# Patient Record
Sex: Female | Born: 1945 | Race: Black or African American | Hispanic: No | Marital: Single | Smoking: Never smoker
Health system: Southern US, Community
[De-identification: ages and names within clinical notes are randomized; demographics above are authoritative.]

## PROBLEM LIST (undated history)

## (undated) DIAGNOSIS — M1990 Unspecified osteoarthritis, unspecified site: Secondary | ICD-10-CM

## (undated) DIAGNOSIS — I10 Essential (primary) hypertension: Secondary | ICD-10-CM

## (undated) DIAGNOSIS — D649 Anemia, unspecified: Secondary | ICD-10-CM

## (undated) DIAGNOSIS — E538 Deficiency of other specified B group vitamins: Secondary | ICD-10-CM

## (undated) DIAGNOSIS — T7840XA Allergy, unspecified, initial encounter: Secondary | ICD-10-CM

## (undated) DIAGNOSIS — Z9884 Bariatric surgery status: Secondary | ICD-10-CM

## (undated) DIAGNOSIS — E119 Type 2 diabetes mellitus without complications: Secondary | ICD-10-CM

## (undated) DIAGNOSIS — E669 Obesity, unspecified: Secondary | ICD-10-CM

## (undated) DIAGNOSIS — M1710 Unilateral primary osteoarthritis, unspecified knee: Secondary | ICD-10-CM

## (undated) DIAGNOSIS — IMO0002 Reserved for concepts with insufficient information to code with codable children: Secondary | ICD-10-CM

## (undated) DIAGNOSIS — J301 Allergic rhinitis due to pollen: Secondary | ICD-10-CM

## (undated) HISTORY — DX: Essential (primary) hypertension: I10

## (undated) HISTORY — DX: Allergic rhinitis due to pollen: J30.1

## (undated) HISTORY — DX: Deficiency of other specified B group vitamins: E53.8

## (undated) HISTORY — DX: Unspecified osteoarthritis, unspecified site: M19.90

## (undated) HISTORY — DX: Allergy, unspecified, initial encounter: T78.40XA

## (undated) HISTORY — DX: Obesity, unspecified: E66.9

## (undated) HISTORY — DX: Reserved for concepts with insufficient information to code with codable children: IMO0002

## (undated) HISTORY — PX: JOINT REPLACEMENT: SHX530

## (undated) HISTORY — DX: Type 2 diabetes mellitus without complications: E11.9

## (undated) HISTORY — DX: Unilateral primary osteoarthritis, unspecified knee: M17.10

## (undated) HISTORY — DX: Bariatric surgery status: Z98.84

## (undated) HISTORY — DX: Anemia, unspecified: D64.9

---

## 2002-04-23 HISTORY — PX: GASTRIC BYPASS: SHX52

## 2004-02-22 ENCOUNTER — Ambulatory Visit: Payer: Self-pay | Admitting: Family Medicine

## 2004-06-26 ENCOUNTER — Ambulatory Visit: Payer: Self-pay | Admitting: Family Medicine

## 2004-07-05 ENCOUNTER — Ambulatory Visit (HOSPITAL_COMMUNITY): Admission: RE | Admit: 2004-07-05 | Discharge: 2004-07-05 | Payer: Self-pay | Admitting: Family Medicine

## 2004-07-31 ENCOUNTER — Ambulatory Visit: Payer: Self-pay | Admitting: Family Medicine

## 2004-08-02 ENCOUNTER — Ambulatory Visit (HOSPITAL_COMMUNITY): Admission: RE | Admit: 2004-08-02 | Discharge: 2004-08-02 | Payer: Self-pay | Admitting: Family Medicine

## 2004-11-15 ENCOUNTER — Ambulatory Visit: Payer: Self-pay | Admitting: Family Medicine

## 2005-01-26 ENCOUNTER — Ambulatory Visit: Payer: Self-pay | Admitting: Family Medicine

## 2005-05-23 ENCOUNTER — Ambulatory Visit: Payer: Self-pay | Admitting: Family Medicine

## 2005-05-30 ENCOUNTER — Encounter (HOSPITAL_COMMUNITY): Admission: RE | Admit: 2005-05-30 | Discharge: 2005-06-29 | Payer: Self-pay | Admitting: Oncology

## 2005-05-30 ENCOUNTER — Encounter: Admission: RE | Admit: 2005-05-30 | Discharge: 2005-05-30 | Payer: Self-pay | Admitting: Oncology

## 2005-05-30 ENCOUNTER — Ambulatory Visit (HOSPITAL_COMMUNITY): Payer: Self-pay | Admitting: Oncology

## 2005-07-03 ENCOUNTER — Encounter: Admission: RE | Admit: 2005-07-03 | Discharge: 2005-07-03 | Payer: Self-pay | Admitting: Oncology

## 2005-07-03 ENCOUNTER — Encounter (HOSPITAL_COMMUNITY): Admission: RE | Admit: 2005-07-03 | Discharge: 2005-08-02 | Payer: Self-pay | Admitting: Oncology

## 2005-07-17 ENCOUNTER — Ambulatory Visit (HOSPITAL_COMMUNITY): Payer: Self-pay | Admitting: Oncology

## 2005-08-06 ENCOUNTER — Ambulatory Visit (HOSPITAL_COMMUNITY): Admission: RE | Admit: 2005-08-06 | Discharge: 2005-08-06 | Payer: Self-pay | Admitting: Family Medicine

## 2005-08-17 ENCOUNTER — Encounter (HOSPITAL_COMMUNITY): Admission: RE | Admit: 2005-08-17 | Discharge: 2005-09-16 | Payer: Self-pay | Admitting: Oncology

## 2005-08-17 ENCOUNTER — Encounter: Admission: RE | Admit: 2005-08-17 | Discharge: 2005-08-17 | Payer: Self-pay | Admitting: Oncology

## 2005-08-28 ENCOUNTER — Ambulatory Visit: Payer: Self-pay | Admitting: Family Medicine

## 2005-09-14 ENCOUNTER — Ambulatory Visit (HOSPITAL_COMMUNITY): Payer: Self-pay | Admitting: Oncology

## 2005-10-12 ENCOUNTER — Encounter (HOSPITAL_COMMUNITY): Admission: RE | Admit: 2005-10-12 | Discharge: 2005-11-11 | Payer: Self-pay | Admitting: Oncology

## 2005-10-12 ENCOUNTER — Encounter: Admission: RE | Admit: 2005-10-12 | Discharge: 2005-10-12 | Payer: Self-pay | Admitting: Oncology

## 2005-11-09 ENCOUNTER — Ambulatory Visit (HOSPITAL_COMMUNITY): Payer: Self-pay | Admitting: Oncology

## 2005-12-07 ENCOUNTER — Encounter (HOSPITAL_COMMUNITY): Admission: RE | Admit: 2005-12-07 | Discharge: 2006-01-06 | Payer: Self-pay | Admitting: Oncology

## 2005-12-07 ENCOUNTER — Encounter: Admission: RE | Admit: 2005-12-07 | Discharge: 2005-12-07 | Payer: Self-pay | Admitting: Oncology

## 2005-12-12 ENCOUNTER — Ambulatory Visit: Payer: Self-pay | Admitting: Family Medicine

## 2005-12-12 ENCOUNTER — Encounter (INDEPENDENT_AMBULATORY_CARE_PROVIDER_SITE_OTHER): Payer: Self-pay | Admitting: Specialist

## 2005-12-19 ENCOUNTER — Other Ambulatory Visit: Admission: RE | Admit: 2005-12-19 | Discharge: 2005-12-19 | Payer: Self-pay | Admitting: Family Medicine

## 2005-12-19 ENCOUNTER — Ambulatory Visit (HOSPITAL_COMMUNITY): Admission: RE | Admit: 2005-12-19 | Discharge: 2005-12-19 | Payer: Self-pay | Admitting: Family Medicine

## 2005-12-19 ENCOUNTER — Encounter: Payer: Self-pay | Admitting: Family Medicine

## 2006-01-04 ENCOUNTER — Ambulatory Visit (HOSPITAL_COMMUNITY): Payer: Self-pay | Admitting: Oncology

## 2006-02-01 ENCOUNTER — Encounter: Admission: RE | Admit: 2006-02-01 | Discharge: 2006-02-01 | Payer: Self-pay | Admitting: Oncology

## 2006-02-05 ENCOUNTER — Ambulatory Visit: Payer: Self-pay | Admitting: Family Medicine

## 2006-02-28 ENCOUNTER — Ambulatory Visit (HOSPITAL_COMMUNITY): Payer: Self-pay | Admitting: Oncology

## 2006-02-28 ENCOUNTER — Ambulatory Visit: Payer: Self-pay | Admitting: Family Medicine

## 2006-04-23 HISTORY — PX: COLONOSCOPY: SHX174

## 2006-04-25 ENCOUNTER — Ambulatory Visit (HOSPITAL_COMMUNITY): Payer: Self-pay | Admitting: Oncology

## 2006-05-29 ENCOUNTER — Encounter (HOSPITAL_COMMUNITY): Admission: RE | Admit: 2006-05-29 | Discharge: 2006-06-28 | Payer: Self-pay | Admitting: Oncology

## 2006-06-27 ENCOUNTER — Ambulatory Visit: Payer: Self-pay | Admitting: Family Medicine

## 2006-06-27 ENCOUNTER — Ambulatory Visit (HOSPITAL_COMMUNITY): Payer: Self-pay | Admitting: Oncology

## 2006-08-27 ENCOUNTER — Ambulatory Visit (HOSPITAL_COMMUNITY): Admission: RE | Admit: 2006-08-27 | Discharge: 2006-08-27 | Payer: Self-pay | Admitting: Family Medicine

## 2006-08-27 ENCOUNTER — Ambulatory Visit: Payer: Self-pay | Admitting: Family Medicine

## 2006-08-27 ENCOUNTER — Ambulatory Visit (HOSPITAL_COMMUNITY): Payer: Self-pay | Admitting: Oncology

## 2006-10-23 ENCOUNTER — Ambulatory Visit (HOSPITAL_COMMUNITY): Payer: Self-pay | Admitting: Oncology

## 2006-12-04 ENCOUNTER — Encounter (HOSPITAL_COMMUNITY): Admission: RE | Admit: 2006-12-04 | Discharge: 2007-01-03 | Payer: Self-pay | Admitting: Oncology

## 2006-12-06 ENCOUNTER — Encounter: Payer: Self-pay | Admitting: Family Medicine

## 2006-12-06 LAB — CONVERTED CEMR LAB
Basophils Absolute: 0 10*3/uL (ref 0.0–0.1)
Basophils Relative: 0 % (ref 0–1)
Cholesterol: 174 mg/dL (ref 0–200)
Eosinophils Absolute: 0.1 10*3/uL (ref 0.0–0.7)
Eosinophils Relative: 3 % (ref 0–5)
HCT: 38.4 % (ref 36.0–46.0)
Hemoglobin: 12.8 g/dL (ref 12.0–15.0)
LDL Cholesterol: 83 mg/dL (ref 0–99)
Lymphs Abs: 1.3 10*3/uL (ref 0.7–3.3)
Monocytes Absolute: 0.7 10*3/uL (ref 0.2–0.7)
Monocytes Relative: 16 % — ABNORMAL HIGH (ref 3–11)
Neutro Abs: 2.2 10*3/uL (ref 1.7–7.7)
Potassium: 3.7 meq/L (ref 3.5–5.3)
RBC: 4.12 M/uL (ref 3.87–5.11)
TSH: 1.01 microintl units/mL (ref 0.350–5.50)
Total CHOL/HDL Ratio: 2.3
Triglycerides: 68 mg/dL (ref <150)

## 2006-12-20 ENCOUNTER — Encounter: Payer: Self-pay | Admitting: Family Medicine

## 2006-12-20 ENCOUNTER — Ambulatory Visit: Payer: Self-pay | Admitting: Family Medicine

## 2006-12-20 ENCOUNTER — Other Ambulatory Visit: Admission: RE | Admit: 2006-12-20 | Discharge: 2006-12-20 | Payer: Self-pay | Admitting: Family Medicine

## 2006-12-20 LAB — CONVERTED CEMR LAB: Pap Smear: NORMAL

## 2007-01-01 ENCOUNTER — Ambulatory Visit (HOSPITAL_COMMUNITY): Payer: Self-pay | Admitting: Oncology

## 2007-01-06 ENCOUNTER — Ambulatory Visit: Payer: Self-pay | Admitting: Family Medicine

## 2007-01-07 ENCOUNTER — Encounter: Payer: Self-pay | Admitting: Family Medicine

## 2007-02-26 ENCOUNTER — Ambulatory Visit (HOSPITAL_COMMUNITY): Payer: Self-pay | Admitting: Oncology

## 2007-03-26 ENCOUNTER — Encounter (HOSPITAL_COMMUNITY): Admission: RE | Admit: 2007-03-26 | Discharge: 2007-04-23 | Payer: Self-pay | Admitting: Oncology

## 2007-04-24 ENCOUNTER — Encounter: Payer: Self-pay | Admitting: Family Medicine

## 2007-05-02 ENCOUNTER — Ambulatory Visit (HOSPITAL_COMMUNITY): Payer: Self-pay | Admitting: Oncology

## 2007-05-13 ENCOUNTER — Ambulatory Visit: Payer: Self-pay | Admitting: Family Medicine

## 2007-05-20 ENCOUNTER — Ambulatory Visit (HOSPITAL_COMMUNITY): Admission: RE | Admit: 2007-05-20 | Discharge: 2007-05-20 | Payer: Self-pay | Admitting: Family Medicine

## 2007-06-03 ENCOUNTER — Encounter (HOSPITAL_COMMUNITY): Admission: RE | Admit: 2007-06-03 | Discharge: 2007-07-03 | Payer: Self-pay | Admitting: Oncology

## 2007-07-01 ENCOUNTER — Ambulatory Visit (HOSPITAL_COMMUNITY): Payer: Self-pay | Admitting: Oncology

## 2007-08-07 ENCOUNTER — Ambulatory Visit: Payer: Self-pay | Admitting: Family Medicine

## 2007-08-15 ENCOUNTER — Encounter: Payer: Self-pay | Admitting: Family Medicine

## 2007-08-15 DIAGNOSIS — I10 Essential (primary) hypertension: Secondary | ICD-10-CM

## 2007-08-15 DIAGNOSIS — J301 Allergic rhinitis due to pollen: Secondary | ICD-10-CM

## 2007-08-15 DIAGNOSIS — E66811 Obesity, class 1: Secondary | ICD-10-CM | POA: Insufficient documentation

## 2007-08-27 ENCOUNTER — Ambulatory Visit (HOSPITAL_COMMUNITY): Payer: Self-pay | Admitting: Oncology

## 2007-09-03 ENCOUNTER — Ambulatory Visit (HOSPITAL_COMMUNITY): Admission: RE | Admit: 2007-09-03 | Discharge: 2007-09-03 | Payer: Self-pay | Admitting: Family Medicine

## 2007-10-29 ENCOUNTER — Encounter (HOSPITAL_COMMUNITY): Admission: RE | Admit: 2007-10-29 | Discharge: 2007-11-28 | Payer: Self-pay | Admitting: Oncology

## 2007-10-29 ENCOUNTER — Ambulatory Visit (HOSPITAL_COMMUNITY): Payer: Self-pay | Admitting: Oncology

## 2007-11-17 ENCOUNTER — Ambulatory Visit: Payer: Self-pay | Admitting: Family Medicine

## 2007-12-03 ENCOUNTER — Ambulatory Visit: Payer: Self-pay | Admitting: Orthopedic Surgery

## 2007-12-04 ENCOUNTER — Encounter: Payer: Self-pay | Admitting: Orthopedic Surgery

## 2007-12-05 ENCOUNTER — Encounter: Payer: Self-pay | Admitting: Orthopedic Surgery

## 2007-12-12 ENCOUNTER — Encounter: Payer: Self-pay | Admitting: Family Medicine

## 2007-12-12 LAB — CONVERTED CEMR LAB
BUN: 15 mg/dL (ref 6–23)
Basophils Relative: 1 % (ref 0–1)
CO2: 24 meq/L (ref 19–32)
Chloride: 107 meq/L (ref 96–112)
Cholesterol: 180 mg/dL (ref 0–200)
Eosinophils Relative: 3 % (ref 0–5)
Glucose, Bld: 81 mg/dL (ref 70–99)
Hemoglobin: 13.2 g/dL (ref 12.0–15.0)
Lymphocytes Relative: 50 % — ABNORMAL HIGH (ref 12–46)
MCV: 90.1 fL (ref 78.0–100.0)
Neutro Abs: 1.3 10*3/uL — ABNORMAL LOW (ref 1.7–7.7)
Potassium: 4.1 meq/L (ref 3.5–5.3)
RBC: 4.34 M/uL (ref 3.87–5.11)
Sodium: 142 meq/L (ref 135–145)
Total CHOL/HDL Ratio: 2.3
VLDL: 14 mg/dL (ref 0–40)
WBC: 3.5 10*3/uL — ABNORMAL LOW (ref 4.0–10.5)

## 2007-12-26 ENCOUNTER — Ambulatory Visit: Payer: Self-pay | Admitting: Family Medicine

## 2007-12-26 ENCOUNTER — Ambulatory Visit (HOSPITAL_COMMUNITY): Payer: Self-pay | Admitting: Oncology

## 2008-02-03 ENCOUNTER — Telehealth: Payer: Self-pay | Admitting: Family Medicine

## 2008-02-13 ENCOUNTER — Encounter: Payer: Self-pay | Admitting: Orthopedic Surgery

## 2008-02-13 ENCOUNTER — Encounter: Payer: Self-pay | Admitting: Family Medicine

## 2008-03-04 ENCOUNTER — Encounter: Payer: Self-pay | Admitting: Family Medicine

## 2008-03-04 ENCOUNTER — Other Ambulatory Visit: Admission: RE | Admit: 2008-03-04 | Discharge: 2008-03-04 | Payer: Self-pay | Admitting: Family Medicine

## 2008-03-04 ENCOUNTER — Ambulatory Visit: Payer: Self-pay | Admitting: Family Medicine

## 2008-03-04 ENCOUNTER — Ambulatory Visit (HOSPITAL_COMMUNITY): Payer: Self-pay | Admitting: Oncology

## 2008-03-29 ENCOUNTER — Ambulatory Visit: Payer: Self-pay | Admitting: Orthopedic Surgery

## 2008-03-29 DIAGNOSIS — Q762 Congenital spondylolisthesis: Secondary | ICD-10-CM | POA: Insufficient documentation

## 2008-04-29 ENCOUNTER — Encounter (HOSPITAL_COMMUNITY): Admission: RE | Admit: 2008-04-29 | Discharge: 2008-05-29 | Payer: Self-pay | Admitting: Oncology

## 2008-04-29 ENCOUNTER — Ambulatory Visit (HOSPITAL_COMMUNITY): Payer: Self-pay | Admitting: Oncology

## 2008-06-01 ENCOUNTER — Encounter: Payer: Self-pay | Admitting: Family Medicine

## 2008-06-29 ENCOUNTER — Ambulatory Visit (HOSPITAL_COMMUNITY): Payer: Self-pay | Admitting: Oncology

## 2008-07-01 ENCOUNTER — Ambulatory Visit: Payer: Self-pay | Admitting: Family Medicine

## 2008-08-24 ENCOUNTER — Ambulatory Visit (HOSPITAL_COMMUNITY): Payer: Self-pay | Admitting: Oncology

## 2008-09-28 ENCOUNTER — Ambulatory Visit (HOSPITAL_COMMUNITY): Admission: RE | Admit: 2008-09-28 | Discharge: 2008-09-28 | Payer: Self-pay | Admitting: Family Medicine

## 2008-10-28 ENCOUNTER — Encounter (HOSPITAL_COMMUNITY): Admission: RE | Admit: 2008-10-28 | Discharge: 2008-11-27 | Payer: Self-pay | Admitting: Oncology

## 2008-10-28 ENCOUNTER — Ambulatory Visit: Payer: Self-pay | Admitting: Family Medicine

## 2008-10-28 ENCOUNTER — Ambulatory Visit (HOSPITAL_COMMUNITY): Payer: Self-pay | Admitting: Oncology

## 2008-10-28 DIAGNOSIS — E538 Deficiency of other specified B group vitamins: Secondary | ICD-10-CM

## 2008-10-28 HISTORY — DX: Deficiency of other specified B group vitamins: E53.8

## 2008-12-23 ENCOUNTER — Ambulatory Visit (HOSPITAL_COMMUNITY): Payer: Self-pay | Admitting: Oncology

## 2009-01-27 ENCOUNTER — Ambulatory Visit: Payer: Self-pay | Admitting: Family Medicine

## 2009-01-27 DIAGNOSIS — R5383 Other fatigue: Secondary | ICD-10-CM

## 2009-01-27 DIAGNOSIS — R5381 Other malaise: Secondary | ICD-10-CM

## 2009-01-31 LAB — CONVERTED CEMR LAB
Basophils Absolute: 0 10*3/uL (ref 0.0–0.1)
CO2: 24 meq/L (ref 19–32)
Calcium: 8.8 mg/dL (ref 8.4–10.5)
Glucose, Bld: 83 mg/dL (ref 70–99)
HCT: 39.2 % (ref 36.0–46.0)
HDL: 75 mg/dL (ref >39)
Hemoglobin: 13 g/dL (ref 12.0–15.0)
Lymphocytes Relative: 49 % — ABNORMAL HIGH (ref 12–46)
MCHC: 33.2 g/dL (ref 30.0–36.0)
MCV: 91.2 fL (ref 78.0–100.0)
Monocytes Absolute: 0.5 10*3/uL (ref 0.1–1.0)
Monocytes Relative: 13 % — ABNORMAL HIGH (ref 3–12)
Potassium: 3.9 meq/L (ref 3.5–5.3)
RBC: 4.3 M/uL (ref 3.87–5.11)
Sodium: 143 meq/L (ref 135–145)
Total CHOL/HDL Ratio: 2.3
WBC: 4.2 10*3/uL (ref 4.0–10.5)

## 2009-02-24 ENCOUNTER — Ambulatory Visit (HOSPITAL_COMMUNITY): Payer: Self-pay | Admitting: Oncology

## 2009-03-08 ENCOUNTER — Other Ambulatory Visit: Admission: RE | Admit: 2009-03-08 | Discharge: 2009-03-08 | Payer: Self-pay | Admitting: Family Medicine

## 2009-03-08 ENCOUNTER — Ambulatory Visit: Payer: Self-pay | Admitting: Family Medicine

## 2009-03-08 ENCOUNTER — Encounter: Payer: Self-pay | Admitting: Family Medicine

## 2009-03-28 ENCOUNTER — Telehealth: Payer: Self-pay | Admitting: Family Medicine

## 2009-04-21 ENCOUNTER — Ambulatory Visit (HOSPITAL_COMMUNITY): Payer: Self-pay | Admitting: Oncology

## 2009-05-19 ENCOUNTER — Encounter (HOSPITAL_COMMUNITY): Admission: RE | Admit: 2009-05-19 | Discharge: 2009-06-18 | Payer: Self-pay | Admitting: Oncology

## 2009-06-01 ENCOUNTER — Encounter: Payer: Self-pay | Admitting: Family Medicine

## 2009-06-16 ENCOUNTER — Ambulatory Visit (HOSPITAL_COMMUNITY): Payer: Self-pay | Admitting: Oncology

## 2009-07-07 ENCOUNTER — Ambulatory Visit: Payer: Self-pay | Admitting: Family Medicine

## 2009-07-07 LAB — CONVERTED CEMR LAB
OCCULT 1: NEGATIVE
OCCULT 2: NEGATIVE

## 2009-08-16 ENCOUNTER — Ambulatory Visit (HOSPITAL_COMMUNITY): Payer: Self-pay | Admitting: Oncology

## 2009-10-03 ENCOUNTER — Ambulatory Visit (HOSPITAL_COMMUNITY): Admission: RE | Admit: 2009-10-03 | Discharge: 2009-10-03 | Payer: Self-pay | Admitting: Family Medicine

## 2009-10-19 ENCOUNTER — Ambulatory Visit (HOSPITAL_COMMUNITY): Payer: Self-pay | Admitting: Oncology

## 2009-11-08 ENCOUNTER — Ambulatory Visit: Payer: Self-pay | Admitting: Family Medicine

## 2009-11-16 ENCOUNTER — Ambulatory Visit: Payer: Self-pay | Admitting: Family Medicine

## 2009-12-14 ENCOUNTER — Ambulatory Visit (HOSPITAL_COMMUNITY): Payer: Self-pay | Admitting: Oncology

## 2009-12-29 ENCOUNTER — Telehealth: Payer: Self-pay | Admitting: Family Medicine

## 2010-01-16 ENCOUNTER — Ambulatory Visit: Payer: Self-pay | Admitting: Orthopedic Surgery

## 2010-01-16 DIAGNOSIS — M1710 Unilateral primary osteoarthritis, unspecified knee: Secondary | ICD-10-CM

## 2010-02-21 ENCOUNTER — Encounter: Payer: Self-pay | Admitting: Family Medicine

## 2010-02-22 ENCOUNTER — Encounter: Payer: Self-pay | Admitting: Family Medicine

## 2010-02-22 LAB — CONVERTED CEMR LAB
Basophils Absolute: 0 10*3/uL (ref 0.0–0.1)
Cholesterol: 186 mg/dL (ref 0–200)
Creatinine, Ser: 0.82 mg/dL (ref 0.40–1.20)
Eosinophils Absolute: 0.1 10*3/uL (ref 0.0–0.7)
Eosinophils Relative: 3 % (ref 0–5)
Hemoglobin: 13.1 g/dL (ref 12.0–15.0)
Lymphs Abs: 2 10*3/uL (ref 0.7–4.0)
MCHC: 33.8 g/dL (ref 30.0–36.0)
Neutro Abs: 2.3 10*3/uL (ref 1.7–7.7)
Potassium: 3.8 meq/L (ref 3.5–5.3)
RDW: 15.7 % — ABNORMAL HIGH (ref 11.5–15.5)
TSH: 1.24 microintl units/mL (ref 0.350–4.500)
Total CHOL/HDL Ratio: 2.5
VLDL: 13 mg/dL (ref 0–40)
Vit D, 25-Hydroxy: 16 ng/mL — ABNORMAL LOW (ref 30–89)
WBC: 4.9 10*3/uL (ref 4.0–10.5)

## 2010-03-10 ENCOUNTER — Encounter (HOSPITAL_COMMUNITY)
Admission: RE | Admit: 2010-03-10 | Discharge: 2010-04-09 | Payer: Self-pay | Source: Home / Self Care | Attending: Oncology | Admitting: Oncology

## 2010-03-13 ENCOUNTER — Ambulatory Visit: Payer: Self-pay | Admitting: Family Medicine

## 2010-03-13 ENCOUNTER — Other Ambulatory Visit: Admission: RE | Admit: 2010-03-13 | Discharge: 2010-03-13 | Payer: Self-pay | Admitting: Family Medicine

## 2010-03-13 LAB — CONVERTED CEMR LAB: OCCULT 1: NEGATIVE

## 2010-03-15 ENCOUNTER — Encounter: Payer: Self-pay | Admitting: Family Medicine

## 2010-03-20 ENCOUNTER — Telehealth (INDEPENDENT_AMBULATORY_CARE_PROVIDER_SITE_OTHER): Payer: Self-pay | Admitting: *Deleted

## 2010-03-20 LAB — CONVERTED CEMR LAB: Pap Smear: NEGATIVE

## 2010-03-22 ENCOUNTER — Telehealth: Payer: Self-pay | Admitting: Family Medicine

## 2010-04-05 ENCOUNTER — Ambulatory Visit (HOSPITAL_COMMUNITY): Payer: Self-pay | Admitting: Oncology

## 2010-04-10 ENCOUNTER — Ambulatory Visit (HOSPITAL_COMMUNITY)
Admission: RE | Admit: 2010-04-10 | Discharge: 2010-04-10 | Payer: Self-pay | Source: Home / Self Care | Attending: Family Medicine | Admitting: Family Medicine

## 2010-04-10 ENCOUNTER — Ambulatory Visit: Payer: Self-pay | Admitting: Family Medicine

## 2010-04-10 DIAGNOSIS — J42 Unspecified chronic bronchitis: Secondary | ICD-10-CM | POA: Insufficient documentation

## 2010-04-10 LAB — CONVERTED CEMR LAB
Basophils Absolute: 0 10*3/uL (ref 0.0–0.1)
Basophils Relative: 1 % (ref 0–1)
Eosinophils Absolute: 0.2 10*3/uL (ref 0.0–0.7)
Eosinophils Relative: 4 % (ref 0–5)
HCT: 38.3 % (ref 36.0–46.0)
Hemoglobin: 13.5 g/dL (ref 12.0–15.0)
WBC: 4.3 10*3/uL (ref 4.0–10.5)

## 2010-05-10 ENCOUNTER — Encounter (HOSPITAL_COMMUNITY)
Admission: RE | Admit: 2010-05-10 | Discharge: 2010-05-23 | Payer: Self-pay | Source: Home / Self Care | Attending: Oncology | Admitting: Oncology

## 2010-05-14 ENCOUNTER — Encounter: Payer: Self-pay | Admitting: Family Medicine

## 2010-05-23 NOTE — Progress Notes (Signed)
Summary: refill  Phone Note Call from Patient   Summary of Call: pt needs refill on blood pressure pill [facility] [phone] Initial call taken by: Rudene Anda,  December 29, 2009 8:15 AM  Follow-up for Phone Call        Told patient it was sent in the beginning of the week. She just wanted to make sure before she called them Follow-up by: Everitt Amber LPN,  December 29, 2009 8:21 AM

## 2010-05-23 NOTE — Letter (Signed)
Summary: history and physical  history and physical   Imported By: Curtis Sites 09/28/2009 12:17:46  _____________________________________________________________________  External Attachment:    Type:   Image     Comment:   External Document

## 2010-05-23 NOTE — Letter (Signed)
Summary: demographic  demographic   Imported By: Curtis Sites 09/28/2009 12:15:40  _____________________________________________________________________  External Attachment:    Type:   Image     Comment:   External Document

## 2010-05-23 NOTE — Miscellaneous (Signed)
  Clinical Lists Changes  Medications: Added new medication of VITAMIN D (ERGOCALCIFEROL) 50000 UNIT CAPS (ERGOCALCIFEROL) one capsule once weekly - Signed Rx of VITAMIN D (ERGOCALCIFEROL) 50000 UNIT CAPS (ERGOCALCIFEROL) one capsule once weekly;  #4 x 5;  Signed;  Entered by: Syliva Overman MD;  Authorized by: Syliva Overman MD;  Method used: Historical    Prescriptions: VITAMIN D (ERGOCALCIFEROL) 50000 UNIT CAPS (ERGOCALCIFEROL) one capsule once weekly  #4 x 5   Entered and Authorized by:   Syliva Overman MD   Signed by:   Syliva Overman MD on 02/22/2010   Method used:   Historical   RxID:   [id number]

## 2010-05-23 NOTE — Letter (Signed)
Summary: misc  misc   Imported By: Curtis Sites 09/28/2009 12:18:19  _____________________________________________________________________  External Attachment:    Type:   Image     Comment:   External Document

## 2010-05-23 NOTE — Assessment & Plan Note (Signed)
Summary: office visit   Vital Signs:  Patient profile:   65 year old female Menstrual status:  postmenopausal Height:      63 inches Weight:      204.50 pounds BMI:     36.36 O2 Sat:      96 % Pulse rate:   66 / minute Pulse rhythm:   regular Resp:     16 per minute BP sitting:   118 / 78  (left arm) Cuff size:   large  Vitals Entered By: Everitt Amber LPN (November 08, 2009 10:07 AM)  Nutrition Counseling: Patient's BMI is greater than 25 and therefore counseled on weight management options. CC: right knee pain, hurts more when she does alot of walking   CC:  right knee pain and hurts more when she does alot of walking.  History of Present Illness: Reports  that she has been doing fairly well. Denies recent fever or chills. Denies sinus pressure, nasal congestion , ear pain or sore throat. Denies chest congestion, or cough productive of sputum. Denies chest pain, palpitations, PND, orthopnea or leg swelling. Denies abdominal pain, nausea, vomitting, diarrhea or constipation. Denies change in bowel movements or bloody stool. Denies dysuria , frequency, incontinence or hesitancy.  Denies headaches, vertigo, seizures.  Denies  rash, lesions, or itch.  She is concerned about her weight , is intolerant of phentermine, but is willing to work more consitentlyh on lifestyle changes to facilitate weight loss   Current Medications (verified): 1)  Calcium-Vitamin D 500-125 Mg-Unit  Tabs (Calcium-Vitamin D) .... Take 1 Tablet By Mouth Three Times A Day 2)  Aspirin 81 Mg  Tabs (Aspirin) .... Take 1 Tablet By Mouth Once A Day 3)  Hydrochlorothiazide 25 Mg Tabs (Hydrochlorothiazide) .... Take 1 Tablet By Mouth Once A Day 4)  Potassium 99 Mg Tabs (Potassium) .... One Tab By Mouth Qd 5)  Vaniqa 13.9 % Crea (Eflornithine Hcl) .... Apply Once Daily To Affected Areas At Night  Allergies (verified): No Known Drug Allergies  Review of Systems      See HPI General:  Complains of  fatigue. Eyes:  Denies blurring, discharge, eye pain, and red eye. MS:  Complains of joint pain and stiffness; bilateral knee pain and instability, right grtr than left. Psych:  Complains of anxiety and depression; denies mental problems, suicidal thoughts/plans, thoughts of violence, and unusual visions or sounds; mild anxiety and depression , on no meds and currently needs none. Endo:  Denies cold intolerance, excessive thirst, excessive urination, and heat intolerance. Heme:  Denies abnormal bruising and bleeding; continues to receive monthy B12 injections. Allergy:  Complains of seasonal allergies; denies hives or rash and itching eyes.  Physical Exam  General:  Well-developed,obese,in no acute distress; alert,appropriate and cooperative throughout examination HEENT: No facial asymmetry,  EOMI, No sinus tenderness, TM's Clear, oropharynx  pink and moist.   Chest: Clear to auscultation bilaterally.  CVS: S1, S2, No murmurs, No S3.   Abd: Soft, Nontender.  MS: Adequate ROM spine, hips, shoulders and  reduced in knees.  Ext: No edema.   CNS: CN 2-12 intact, power tone and sensation normal throughout.   Skin: Intact, no visible lesions or rashes.  Psych: Good eye contact, normal affect.  Memory intact, not anxious or depressed appearing.    Impression & Recommendations:  Problem # 1:  KNEE PAIN, RIGHT (ICD-719.46) Assessment Deteriorated  Her updated medication list for this problem includes:    Aspirin 81 Mg Tabs (Aspirin) .Marland Kitchen... Take 1 tablet  by mouth once a day  Orders: Depo- Medrol 80mg  (J1040) Ketorolac-Toradol 15mg  (Z6109) Admin of Therapeutic Inj  intramuscular or subcutaneous (60454)  Problem # 2:  OBESITY (ICD-278.00) Assessment: Unchanged  Ht: 63 (11/08/2009)   Wt: 204.50 (11/08/2009)   BMI: 36.36 (11/08/2009)  Problem # 3:  HYPERTENSION (ICD-401.9) Assessment: Unchanged  Her updated medication list for this problem includes:    Hydrochlorothiazide 25 Mg Tabs  (Hydrochlorothiazide) .Marland Kitchen... Take 1 tablet by mouth once a day  Orders: T-Basic Metabolic Panel [phone])  BP today: 118/78 Prior BP: 120/78 (07/07/2009)  Labs Reviewed: K+: 3.9 (01/27/2009) Creat: : 0.87 (01/27/2009)   Chol: 172 (01/27/2009)   HDL: 75 (01/27/2009)   LDL: 84 (01/27/2009)   TG: 66 (01/27/2009)  Complete Medication List: 1)  Calcium-vitamin D 500-125 Mg-unit Tabs (Calcium-vitamin d) .... Take 1 tablet by mouth three times a day 2)  Aspirin 81 Mg Tabs (Aspirin) .... Take 1 tablet by mouth once a day 3)  Hydrochlorothiazide 25 Mg Tabs (Hydrochlorothiazide) .... Take 1 tablet by mouth once a day 4)  Potassium 99 Mg Tabs (Potassium) .... One tab by mouth qd 5)  Vaniqa 13.9 % Crea (Eflornithine hcl) .... Apply once daily to affected areas at night 6)  Voltaren 1 % Gel (Diclofenac sodium) .... Apply twice daily to affected areas for pain as needed  Other Orders: T-Lipid Profile [phone]) T-CBC w/Diff [id number]) T-TSH [phone]) T-Vitamin D (25-Hydroxy) [phone])  Patient Instructions: 1)  cE in mid november 2)  It is important that you exercise regularly at least 20 minutes 5 times a week. If you develop chest pain, have severe difficulty breathing, or feel very tired , stop exercising immediately and seek medical attention. 3)  You need to lose weight. Consider a lower calorie diet and regular exercise.  4)  BMP prior to visit, ICD-9: 5)  Lipid Panel prior to visit, ICD-9: 6)  TSH prior to visit, ICD-9:   fastin in Ninovember 7)  CBC w/ Diff prior to visit, ICD-9: 8)  vitamin d 9)  you will get 2 injections today for your right knee Prescriptions: VOLTAREN 1 % GEL (DICLOFENAC SODIUM) apply twice daily to affected areas for pain as needed  #45 gm x 1   Entered and Authorized by:   Syliva Overman MD   Signed by:   Syliva Overman MD on 11/08/2009   Method used:   [facility] to        [facility] [phone]* (retail)       [address]       [place], [place]  [address]       Ph: [phone]       Fax: [phone]   RxID:   [id number]    Medication Administration  Injection # 1:    Medication: Depo- Medrol 80mg     Diagnosis: KNEE PAIN, RIGHT (ICD-719.46)    Route: IM    Site: RUOQ gluteus    Exp Date: 07/2010    Lot #: [id number]    Mfr: Pharmacia    Comments: 80mg  given     Patient tolerated injection without complications    Given by: Everitt Amber LPN (November 08, 2009 10:48 AM)  Injection # 2:    Medication: Ketorolac-Toradol 15mg     Diagnosis: KNEE PAIN, RIGHT (ICD-719.46)    Route: IM    Site: LUOQ gluteus    Exp Date: 06/2011    Lot #: [id number]  Mfr: [email or web address]    Comments: 60mg  given     Patient tolerated injection without complications    Given by: Everitt Amber LPN (November 08, 2009 10:49 AM)  Orders Added: 1)  Est. Patient Level IV [99214] 2)  T-Basic Metabolic Panel [phone] 3)  T-Lipid Profile [80061-22930] 4)  T-CBC w/Diff [[id number]] 5)  T-TSH [66440-34742] 6)  T-Vitamin D (25-Hydroxy) [59563-87564] 7)  Depo- Medrol 80mg  [J1040] 8)  Ketorolac-Toradol 15mg  [J1885] 9)  Admin of Therapeutic Inj  intramuscular or subcutaneous [33295]

## 2010-05-23 NOTE — Assessment & Plan Note (Signed)
Summary: office visit   Vital Signs:  Patient profile:   65 year old female Menstrual status:  postmenopausal Height:      63 inches Weight:      202.75 pounds BMI:     36.05 O2 Sat:      93 % Pulse rate:   61 / minute Pulse rhythm:   regular Resp:     16 per minute BP sitting:   120 / 78  Vitals Entered By: Everitt Amber LPN (July 07, 2009 10:40 AM)  Nutrition Counseling: Patient's BMI is greater than 25 and therefore counseled on weight management options. CC: Follow up chronic problems Is Patient Diabetic? No Pain Assessment Patient in pain? no        CC:  Follow up chronic problems.  History of Present Illness: Reports  that they are doing well. Denies recent fever or chills. Denies sinus pressure, nasal congestion , ear pain or sore throat. Denies chest congestion, or cough productive of sputum. Denies chest pain, palpitations, PND, orthopnea or leg swelling. Denies abdominal pain, nausea, vomitting, diarrhea or constipation. Denies change in bowel movements or bloody stool. Denies dysuria , frequency, incontinence or hesitancy. Denies  joint pain, swelling, or reduced mobility. Denies headaches, vertigo, seizures. Denies depression, anxiety or insomnia. Denies  rash, lesions, or itch. Pt continues to modify her diet and exercisewith modest weight loss success       Current Medications (verified): 1)  Calcium-Vitamin D 500-125 Mg-Unit  Tabs (Calcium-Vitamin D) .... Take 1 Tablet By Mouth Three Times A Day 2)  Aspirin 81 Mg  Tabs (Aspirin) .... Take 1 Tablet By Mouth Once A Day 3)  Hydrochlorothiazide 25 Mg Tabs (Hydrochlorothiazide) .... Take 1 Tablet By Mouth Once A Day 4)  Potassium 99 Mg Tabs (Potassium) .... One Tab By Mouth Qd 5)  Vaniqa 13.9 % Crea (Eflornithine Hcl) .... Apply Once Daily To Affected Areas At Night  Allergies (verified): No Known Drug Allergies  Review of Systems      See HPI Eyes:  Denies blurring and discharge. Heme:  Denies  abnormal bruising and bleeding. Allergy:  Complains of seasonal allergies.  Physical Exam  General:  Well-developed,obese,in no acute distress; alert,appropriate and cooperative throughout examination HEENT: No facial asymmetry,  EOMI, No sinus tenderness, TM's Clear, oropharynx  pink and moist.   Chest: Clear to auscultation bilaterally.  CVS: S1, S2, No murmurs, No S3.   Abd: Soft, Nontender.  MS: Adequate ROM spine, hips, shoulders and knees.  Ext: No edema.   CNS: CN 2-12 intact, power tone and sensation normal throughout.   Skin: Intact, no visible lesions or rashes.  Psych: Good eye contact, normal affect.  Memory intact, not anxious or depressed appearing.    Impression & Recommendations:  Problem # 1:  OBESITY (ICD-278.00) Assessment Improved  Ht: 63 (07/07/2009)   Wt: 202.75 (07/07/2009)   BMI: 36.05 (07/07/2009)  Problem # 2:  HYPERTENSION (ICD-401.9) Assessment: Unchanged  Her updated medication list for this problem includes:    Hydrochlorothiazide 25 Mg Tabs (Hydrochlorothiazide) .Marland Kitchen... Take 1 tablet by mouth once a day  BP today: 120/78 Prior BP: 120/80 (03/08/2009)  Labs Reviewed: K+: 3.9 (01/27/2009) Creat: : 0.87 (01/27/2009)   Chol: 172 (01/27/2009)   HDL: 75 (01/27/2009)   LDL: 84 (01/27/2009)   TG: 66 (01/27/2009)  Complete Medication List: 1)  Calcium-vitamin D 500-125 Mg-unit Tabs (Calcium-vitamin d) .... Take 1 tablet by mouth three times a day 2)  Aspirin 81 Mg Tabs (Aspirin) .Marland KitchenMarland KitchenMarland Kitchen  Take 1 tablet by mouth once a day 3)  Hydrochlorothiazide 25 Mg Tabs (Hydrochlorothiazide) .... Take 1 tablet by mouth once a day 4)  Potassium 99 Mg Tabs (Potassium) .... One tab by mouth qd 5)  Vaniqa 13.9 % Crea (Eflornithine hcl) .... Apply once daily to affected areas at night  Other Orders: Hemoccult Guaiac-1 spec.(in office) (16109)  Patient Instructions: 1)  Please schedule a follow-up appointment in 4  months. 2)  Congrats on weight loss, pls keep itup.You  have lost 5 pounds. 3)  It is important that you exercise regularly at least 20 minutes 5 times a week. If you develop chest pain, have severe difficulty breathing, or feel very tired , stop exercising immediately and seek medical attention. 4)  You need to lose weight. Consider a lower calorie diet and regular exercise.  5)  No med changes at this time Prescriptions: HYDROCHLOROTHIAZIDE 25 MG TABS (HYDROCHLOROTHIAZIDE) Take 1 tablet by mouth once a day  #30 x 5   Entered by:   Everitt Amber LPN   Authorized by:   Syliva Overman MD   Signed by:   Everitt Amber LPN on 60/45/4098   Method used:   [facility]ically to        [facility] [phone]* (retail)       [address]       [place], [place]  [address]       Ph: [phone]       Fax: [phone]   RxID:   [id number]   Laboratory Results    Stool - Occult Blood Hemmoccult #1: negative Date: 07/06/2009 Hemoccult #2: negative Date: 07/07/2009 Comments: [id number] [id number] 11/13 118 10/12  pt collected at home

## 2010-05-23 NOTE — Assessment & Plan Note (Signed)
Summary: rt knee pain needs xr/fed bcbs/bsf   Visit Type:  Follow-up  CC:  right knee pain.  History of Present Illness: I saw Sheri Jones in the office today for a followup visit.  She is a 65 years old woman with the complaint of:  right knee pain  Xrays today.  Medications: Hydrochlorothiazide 25 mg, 1 tablet a day. Occasional Tylenol or Aleve.  Last seen herein 08/09 for her right knee.  She has been having pain and stiffness, hard to get up and get going. She has swelling sometimes.   her knee hurts on the lateral side but more so in the front  Exam shows crepitation on range of motion tenderness along the lateral joint line range of motion proximal 120 strength normal in the stable ambulation normal sensation normal skin normal   X-ray report 3 views RIGHT knee X-rays show valgus joint space narrowing and severe lateral compartment large patellofemoral spur  Impression severe valgus osteoarthritis   Allergies: No Known Drug Allergies  Review of Systems      See HPI   Impression & Recommendations:  Problem # 1:  KNEE, ARTHRITIS, DEGEN./OSTEO (ICD-715.96) Assessment Deteriorated  Her updated medication list for this problem includes:    Aspirin 81 Mg Tabs (Aspirin) .Marland Kitchen... Take 1 tablet by mouth once a day   Inject RIGHT knee joint Verbal consent was obtained. The knee was prepped with alcohol and ethyl chloride. 1 cc of depomedrol 40mg /cc and 4 cc of lidocaine 1% was injected. there were no complications.  Patient Instructions: 1)  You have received an injection of cortisone today. You may experience increased pain at the injection site. Apply ice pack to the area for 20 minutes every 2 hours and take 2 xtra strength tylenol every 8 hours. This increased pain will usually resolve in 24 hours. The injection will take effect in 3-10 days.  2)  Aleve or tylenol arthritis is fine  3)  Please schedule a follow-up appointment as needed.  Appended Document: rt knee  pain needs xr/fed bcbs/bsf

## 2010-05-23 NOTE — Letter (Signed)
Summary: consults  consults   Imported By: Curtis Sites 09/28/2009 12:15:14  _____________________________________________________________________  External Attachment:    Type:   Image     Comment:   External Document

## 2010-05-23 NOTE — Assessment & Plan Note (Signed)
Summary: EAR FLUSHING  Nurse Visit  Comments Patient in office today for a left ear flushing. Patient tolerated procedure well and ears successfully irrigated with no complications. Was checked by Dr. Lodema Hong    Allergies: No Known Drug Allergies  Orders Added: 1)  Cerumen Impaction Removal [69210]

## 2010-05-23 NOTE — Letter (Signed)
Summary: progress notes  progress notes   Imported By: Curtis Sites 09/28/2009 12:18:53  _____________________________________________________________________  External Attachment:    Type:   Image     Comment:   External Document

## 2010-05-23 NOTE — Letter (Signed)
Summary: xray  xray   Imported By: Curtis Sites 09/28/2009 12:19:12  _____________________________________________________________________  External Attachment:    Type:   Image     Comment:   External Document

## 2010-05-23 NOTE — Assessment & Plan Note (Signed)
Summary: PHY   Vital Signs:  Patient profile:   65 year old female Menstrual status:  postmenopausal Height:      63 inches Weight:      208.50 pounds BMI:     37.07 O2 Sat:      98 % on Room air Pulse rate:   67 / minute Pulse rhythm:   regular Resp:     16 per minute BP sitting:   128 / 80  (left arm)  Vitals Entered By: Adella Hare LPN (March 13, 2010 10:57 AM)  Nutrition Counseling: Patient's BMI is greater than 25 and therefore counseled on weight management options.  O2 Flow:  Room air CC: physical Is Patient Diabetic? No Pain Assessment Patient in pain? no       Vision Screening:Left eye w/o correction: 20 / 40 Right Eye w/o correction: 20 / 30 Both eyes w/o correction:  20/ 25        Vision Entered By: Adella Hare LPN (March 13, 2010 10:58 AM)   CC:  physical.  History of Present Illness: Reports  that she has generally been  doing well. Denies recent fever or chills. Denies sinus pressure, nasal congestion , ear pain or sore throat. Denies chest congestion, or cough productive of sputum. Denies chest pain, palpitations, PND, orthopnea or leg swelling. Denies abdominal pain, nausea, vomitting, diarrhea or constipation. Denies change in bowel movements or bloody stool. Denies dysuria , frequency, incontinence or hesitancy. Denies  joint pain, swelling, or reduced mobility. Denies headaches, vertigo, seizures. Denies depression, anxiety or insomnia. Denies  rash, lesions, or itch.     Current Medications (verified): 1)  Calcium-Vitamin D 500-125 Mg-Unit  Tabs (Calcium-Vitamin D) .... Take 1 Tablet By Mouth Three Times A Day 2)  Aspirin 81 Mg  Tabs (Aspirin) .... Take 1 Tablet By Mouth Once A Day 3)  Hydrochlorothiazide 25 Mg Tabs (Hydrochlorothiazide) .... Take 1 Tablet By Mouth Once A Day 4)  Potassium 99 Mg Tabs (Potassium) .... One Tab By Mouth Qd  Allergies (verified): No Known Drug Allergies  Review of Systems      See HPI Eyes:   Denies blurring, discharge, double vision, eye pain, and red eye. Endo:  Denies excessive hunger, excessive thirst, heat intolerance, and polyuria. Heme:  Denies abnormal bruising, bleeding, enlarge lymph nodes, and pallor. Allergy:  Denies hives or rash and itching eyes.  Physical Exam  General:  Well-developed,obese,in no acute distress; alert,appropriate and cooperative throughout examination Head:  Normocephalic and atraumatic without obvious abnormalities. No apparent alopecia or balding. Eyes:  No corneal or conjunctival inflammation noted. EOMI. Perrla. Funduscopic exam benign, without hemorrhages, exudates or papilledema. Vision grossly normal. Ears:  External ear exam shows no significant lesions or deformities.  Otoscopic examination reveals clear canals, tympanic membranes are intact bilaterally without bulging, retraction, inflammation or discharge. Hearing is grossly normal bilaterally. Nose:  External nasal examination shows no deformity or inflammation. Nasal mucosa are pink and moist without lesions or exudates. Mouth:  Oral mucosa and oropharynx without lesions or exudates.  Teeth in good repair. Neck:  No deformities, masses, or tenderness noted. Chest Wall:  No deformities, masses, or tenderness noted. Breasts:  No mass, nodules, thickening, tenderness, bulging, retraction, inflamation, nipple discharge or skin changes noted.   Lungs:  Normal respiratory effort, chest expands symmetrically. Lungs are clear to auscultation, no crackles or wheezes. Heart:  Normal rate and regular rhythm. S1 and S2 normal without gallop, murmur, click, rub or other extra sounds. Abdomen:  Bowel sounds positive,abdomen soft and non-tender without masses, organomegaly or hernias noted. Rectal:  No external abnormalities noted. Normal sphincter tone. No rectal masses or tenderness. Genitalia:  Normal introitus for age, no external lesions, no vaginal discharge, mucosa pink and moist, no vaginal or  cervical lesions, no vaginal atrophy, no friaility or hemorrhage, normal uterus size and position, no adnexal masses or tenderness Msk:  No deformity or scoliosis noted of thoracic or lumbar spine.   Pulses:  R and L carotid,radial,femoral,dorsalis pedis and posterior tibial pulses are full and equal bilaterally Extremities:  No clubbing, cyanosis, edema, or deformity noted with normal full range of motion of all joints.   Neurologic:  No cranial nerve deficits noted. Station and gait are normal. Plantar reflexes are down-going bilaterally. DTRs are symmetrical throughout. Sensory, motor and coordinative functions appear intact. Skin:  Intact without suspicious lesions or rashes Cervical Nodes:  No lymphadenopathy noted Axillary Nodes:  No palpable lymphadenopathy Inguinal Nodes:  No significant adenopathy Psych:  Cognition and judgment appear intact. Alert and cooperative with normal attention span and concentration. No apparent delusions, illusions, hallucinations   Impression & Recommendations:  Problem # 1:  HYPERTENSION (ICD-401.9) Assessment Unchanged  Her updated medication list for this problem includes:    Hydrochlorothiazide 25 Mg Tabs (Hydrochlorothiazide) .Marland Kitchen... Take 1 tablet by mouth once a day  BP today: 128/80 Prior BP: 118/78 (11/08/2009)  Labs Reviewed: K+: 3.8 (02/21/2010) Creat: : 0.82 (02/21/2010)   Chol: 186 (02/21/2010)   HDL: 75 (02/21/2010)   LDL: 98 (02/21/2010)   TG: 67 (02/21/2010)  Problem # 2:  OBESITY (ICD-278.00) Assessment: Unchanged  Ht: 63 (03/13/2010)   Wt: 208.50 (03/13/2010)   BMI: 37.07 (03/13/2010) therapeutic lifestyle change discussed and encouraged  Problem # 3:  PHYSICAL EXAMINATION (ICD-V70.0) Assessment: Comment Only pap sent. stool tested and is guaic negative  Complete Medication List: 1)  Calcium-vitamin D 500-125 Mg-unit Tabs (Calcium-vitamin d) .... Take 1 tablet by mouth three times a day 2)  Aspirin 81 Mg Tabs (Aspirin) ....  Take 1 tablet by mouth once a day 3)  Hydrochlorothiazide 25 Mg Tabs (Hydrochlorothiazide) .... Take 1 tablet by mouth once a day 4)  Potassium 99 Mg Tabs (Potassium) .... One tab by mouth qd  Other Orders: Pap Smear (29528) Hemoccult Guaiac-1 spec.(in office) (41324)  Patient Instructions: 1)  Please schedule a follow-up appointment in 4.5 months. 2)  It is important that you exercise regularly at least 60 minutes 5 times a week. If you develop chest pain, have severe difficulty breathing, or feel very tired , stop exercising immediately and seek medical attention. 3)  You need to lose weight. Consider a lower calorie diet and regular exercise.  4)  Nomed changes. 5)  All the best for the New yr, and happy holidays Prescriptions: HYDROCHLOROTHIAZIDE 25 MG TABS (HYDROCHLOROTHIAZIDE) Take 1 tablet by mouth once a day  #30 Tablet x 3   Entered by:   Adella Hare LPN   Authorized by:   Syliva Overman MD   Signed by:   Adella Hare LPN on 40/01/2724   Method used:   [facility]        [facility] [phone]* (retail)       [address]       [place], [place]  [address]       Ph: [phone]       Fax: [phone]   RxID:   [id number]  Orders Added: 1)  Est. Patient 40-64 years [99396] 2)  Pap Smear [88150] 3)  Hemoccult Guaiac-1 spec.(in office) [82270]      Laboratory Results  Date/Time Received: March 13, 2010 11:59 AM  Date/Time Reported: March 13, 2010 11:59 AM   Stool - Occult Blood Hemmoccult #1: negative Date: 03/13/2010 Comments: 50201 10l 02/13 118 10/12 Adella Hare LPN  March 13, 2010 11:59 AM

## 2010-05-23 NOTE — Progress Notes (Signed)
Summary: [facility]  [facility]   Imported By: Lind Guest 06/28/2009 08:11:20  _____________________________________________________________________  External Attachment:    Type:   Image     Comment:   External Document

## 2010-05-23 NOTE — Progress Notes (Signed)
  Phone Note Call from Patient   Summary of Call: patient states she started having sinus drainage on Sat, she is coughing real bad she doen't have fever but she does have green mucus, wanted to know if you could call her in something, [facility] [place] [facility].  please advise. Initial call taken by: Curtis Sites,  March 20, 2010 10:07 AM  Follow-up for Phone Call        pls advise med is sent in  Follow-up by: Syliva Overman MD,  March 20, 2010 12:20 PM  Additional Follow-up for Phone Call Additional follow up Details #1::        advised patient she said thank you. Additional Follow-up by: Curtis Sites,  March 20, 2010 1:52 PM    New/Updated Medications: SEPTRA DS 800-160 MG TABS (SULFAMETHOXAZOLE-TRIMETHOPRIM) Take 1 tablet by mouth two times a day Prescriptions: SEPTRA DS 800-160 MG TABS (SULFAMETHOXAZOLE-TRIMETHOPRIM) Take 1 tablet by mouth two times a day  #20 x 0   Entered and Authorized by:   Syliva Overman MD   Signed by:   Syliva Overman MD on 03/20/2010   Method used:   Electronically to        [facility] [phone]* (retail)       [address]       [place], [place]  [address]       Ph: [phone]       Fax: [phone]   RxID:   [id number]

## 2010-05-23 NOTE — Progress Notes (Signed)
Summary: Z PAK  Phone Note Call from Patient   Summary of Call: the antibiotic THAT YOU HAD RX HER IS DOING NO GOOD and is making her sick to her stomach, WANTS TO KNOW CAN SHE GET A Z PAK CALLED INTO [facility] THE # [phone] Initial call taken by: Lind Guest,  March 22, 2010 8:23 AM  Follow-up for Phone Call        non productive bad cough, no fever, states she is having body aches, no chills  Follow-up by: Adella Hare LPN,  March 22, 2010 5:50 PM    New/Updated Medications: PREDNISONE (PAK) 5 MG TABS (PREDNISONE) Use as directed TUSSIONEX PENNKINETIC ER 10-8 MG/5ML LQCR (HYDROCOD POLST-CHLORPHEN POLST) one teaspoon twice daily as needed for uncontrolled cough SEPTRA DS 800-160 MG TABS (SULFAMETHOXAZOLE-TRIMETHOPRIM) Take 1 tablet by mouth two times a day Prescriptions: SEPTRA DS 800-160 MG TABS (SULFAMETHOXAZOLE-TRIMETHOPRIM) Take 1 tablet by mouth two times a day  #20 x 0   Entered and Authorized by:   Syliva Overman MD   Signed by:   Syliva Overman MD on 03/22/2010   Method used:   Historical   RxID:   [id number] TUSSIONEX PENNKINETIC ER 10-8 MG/5ML LQCR (HYDROCOD POLST-CHLORPHEN POLST) one teaspoon twice daily as needed for uncontrolled cough  #300cc x 0   Entered and Authorized by:   Syliva Overman MD   Signed by:   Syliva Overman MD on 03/22/2010   Method used:   Printed then faxed to ...       [facility] [phone]* (retail)       [address]       [place], [place]  [address]       Ph: [phone]       Fax: [phone]   RxID:   [id number] PREDNISONE (PAK) 5 MG TABS (PREDNISONE) Use as directed  #21 x 0   Entered and Authorized by:   Syliva Overman MD   Signed by:   Syliva Overman MD on 03/22/2010   Method used:   Electronically to        [facility] [phone]* (retail)       [address]       [place], [place]  [address]       Ph: [phone]       Fax:  [phone]   RxID:   [id number]

## 2010-05-23 NOTE — Letter (Signed)
Summary: Pap Smear, Normal Letter, [facility]  [address]   [place], [place] [address]   Phone: [phone]  Fax: [phone]          March 15, 2010    Dear: Sheri Jones    I am pleased to notify you that your PAP smear was normal.  You will need your next PAP smear in:     ____ 3 Months    ____ 6 Months    ____ 12 Months    Please call the office at our office number above, to schedule your next appointment.    Sincerely,     [facility]

## 2010-05-23 NOTE — Letter (Signed)
Summary: phone notes  phone notes   Imported By: Curtis Sites 09/28/2009 12:18:36  _____________________________________________________________________  External Attachment:    Type:   Image     Comment:   External Document

## 2010-05-23 NOTE — Letter (Signed)
Summary: lab  lab   Imported By: Curtis Sites 09/28/2009 12:18:03  _____________________________________________________________________  External Attachment:    Type:   Image     Comment:   External Document

## 2010-05-25 NOTE — Assessment & Plan Note (Signed)
Summary: FOLLOW-UP [facility] FOR BRONCHITIS   Vital Signs:  Patient profile:   65 year old female Menstrual status:  postmenopausal Height:      63 inches Weight:      209.25 pounds BMI:     37.20 O2 Sat:      98 % on Room air Pulse rate:   72 / minute Pulse rhythm:   regular Resp:     16 per minute BP sitting:   110 / 82  (left arm)  Vitals Entered By: Adella Hare LPN (April 10, 2010 9:59 AM)  Nutrition Counseling: Patient's BMI is greater than 25 and therefore counseled on weight management options.  O2 Flow:  Room air CC: bronchitis follow up, still coughing Is Patient Diabetic? No   CC:  bronchitis follow up and still coughing.  History of Present Illness: pT REPORTS THAT SHE IS COUGHING LESS , still has some green sputum and cough, no chills or fever currently, concerned about pneumonia or contagious status. has completed z pack and prednisone dose pack , no upper resp symptoms at any time. She has been unable to exercise and is concerned about possible weight gain  Current Medications (verified): 1)  Calcium-Vitamin D 500-125 Mg-Unit  Tabs (Calcium-Vitamin D) .... Take 1 Tablet By Mouth Three Times A Day 2)  Aspirin 81 Mg  Tabs (Aspirin) .... Take 1 Tablet By Mouth Once A Day 3)  Hydrochlorothiazide 25 Mg Tabs (Hydrochlorothiazide) .... Take 1 Tablet By Mouth Once A Day 4)  Potassium 99 Mg Tabs (Potassium) .... One Tab By Mouth Qd  Allergies (verified): No Known Drug Allergies  Review of Systems      See HPI General:  Denies chills, fatigue, fever, and malaise. Eyes:  Denies discharge and red eye. ENT:  Denies hoarseness, nasal congestion, sinus pressure, and sore throat. CV:  Denies chest pain or discomfort, palpitations, and swelling of feet. Resp:  Complains of cough and sputum productive. GI:  Denies abdominal pain, constipation, diarrhea, nausea, and vomiting. GU:  Denies dysuria and urinary frequency. MS:  Complains of joint pain and  stiffness. Allergy:  Denies hives or rash and itching eyes.  Physical Exam  General:  Well-developed,obese,in no acute distress; alert,appropriate and cooperative throughout examination HEENT: No facial asymmetry,  EOMI, No sinus tenderness, TM's Clear, oropharynx  pink and moist.   Chest: Clear to auscultation bilaterally.  CVS: S1, S2, No murmurs, No S3.   Abd: Soft, Nontender.  MS: Adequate ROM spine, hips, shoulders and knees.  Ext: No edema.   CNS: CN 2-12 intact, power tone and sensation normal throughout.   Skin: Intact, no visible lesions or rashes.  Psych: Good eye contact, normal affect.  Memory intact, not anxious or depressed appearing.    Impression & Recommendations:  Problem # 1:  OTHER CHRONIC BRONCHITIS (ICD-491.8) Assessment Comment Only  Orders: CXR- 2view (CXR) T-CBC w/Diff ([id number])  Problem # 2:  KNEE, ARTHRITIS, DEGEN./OSTEO (ICD-715.96) Assessment: Unchanged  Her updated medication list for this problem includes:    Aspirin 81 Mg Tabs (Aspirin) .Marland Kitchen... Take 1 tablet by mouth once a day  Problem # 3:  HYPERTENSION (ICD-401.9) Assessment: Improved  Her updated medication list for this problem includes:    Hydrochlorothiazide 25 Mg Tabs (Hydrochlorothiazide) .Marland Kitchen... Take 1 tablet by mouth once a day  BP today: 110/82 Prior BP: 128/80 (03/13/2010)  Labs Reviewed: K+: 3.8 (02/21/2010) Creat: : 0.82 (02/21/2010)   Chol: 186 (02/21/2010)   HDL: 75 (02/21/2010)   LDL:  98 (02/21/2010)   TG: 67 (02/21/2010)  Problem # 4:  OBESITY (ICD-278.00) Assessment: Unchanged  Ht: 63 (04/10/2010)   Wt: 209.25 (04/10/2010)   BMI: 37.20 (04/10/2010) therapeutic lifestyle change discussed and encouraged  Complete Medication List: 1)  Calcium-vitamin D 500-125 Mg-unit Tabs (Calcium-vitamin d) .... Take 1 tablet by mouth three times a day 2)  Aspirin 81 Mg Tabs (Aspirin) .... Take 1 tablet by mouth once a day 3)  Hydrochlorothiazide 25 Mg Tabs  (Hydrochlorothiazide) .... Take 1 tablet by mouth once a day 4)  Potassium 99 Mg Tabs (Potassium) .... One tab by mouth qd  Patient Instructions: 1)  f/u as before 2)  pLS get a cXr today and cbc with diff  sTAT today 3)  I see no evidence of ongoing infection at this time and will call with results 4)  It is important that you exercise regularly at least 20 minutes 5 times a week. If you develop chest pain, have severe difficulty breathing, or feel very tired , stop exercising immediately and seek medical attention. 5)  You need to lose weight. Consider a lower calorie diet and regular exercise.    Orders Added: 1)  CXR- 2view [CXR] 2)  Est. Patient Level IV [16109] 3)  T-CBC w/Diff [[id number]]

## 2010-06-07 ENCOUNTER — Encounter (HOSPITAL_COMMUNITY): Payer: Federal, State, Local not specified - PPO | Attending: Oncology

## 2010-06-07 ENCOUNTER — Ambulatory Visit (HOSPITAL_COMMUNITY): Payer: Federal, State, Local not specified - PPO | Admitting: Oncology

## 2010-06-07 ENCOUNTER — Other Ambulatory Visit (HOSPITAL_COMMUNITY): Payer: Self-pay | Admitting: Oncology

## 2010-06-07 ENCOUNTER — Encounter: Payer: Self-pay | Admitting: Family Medicine

## 2010-06-07 ENCOUNTER — Ambulatory Visit (HOSPITAL_COMMUNITY): Payer: Federal, State, Local not specified - PPO

## 2010-06-07 DIAGNOSIS — Z9884 Bariatric surgery status: Secondary | ICD-10-CM | POA: Insufficient documentation

## 2010-06-07 DIAGNOSIS — E538 Deficiency of other specified B group vitamins: Secondary | ICD-10-CM | POA: Insufficient documentation

## 2010-06-08 LAB — FERRITIN: Ferritin: 80 ng/mL (ref 10–291)

## 2010-06-27 ENCOUNTER — Encounter: Payer: Self-pay | Admitting: Gastroenterology

## 2010-07-05 ENCOUNTER — Encounter: Payer: Self-pay | Admitting: Gastroenterology

## 2010-07-05 ENCOUNTER — Ambulatory Visit (INDEPENDENT_AMBULATORY_CARE_PROVIDER_SITE_OTHER): Payer: Federal, State, Local not specified - PPO | Admitting: Gastroenterology

## 2010-07-05 ENCOUNTER — Ambulatory Visit (HOSPITAL_COMMUNITY): Payer: Federal, State, Local not specified - PPO

## 2010-07-05 DIAGNOSIS — Z860100 Personal history of colon polyps, unspecified: Secondary | ICD-10-CM | POA: Insufficient documentation

## 2010-07-05 DIAGNOSIS — Z1211 Encounter for screening for malignant neoplasm of colon: Secondary | ICD-10-CM

## 2010-07-05 DIAGNOSIS — E538 Deficiency of other specified B group vitamins: Secondary | ICD-10-CM

## 2010-07-11 NOTE — Letter (Signed)
Summary: [facility]  [facility]   Imported By: Lind Guest 07/06/2010 10:27:53  _____________________________________________________________________  External Attachment:    Type:   Image     Comment:   External Document

## 2010-07-11 NOTE — Assessment & Plan Note (Addendum)
Summary: consult for tcs/egd,hx of gastric bypass/dx b12 def   Vital Signs:  Patient profile:   65 year old female Menstrual status:  postmenopausal Height:      63 inches Weight:      209 pounds BMI:     37.16 Temp:     97.7 degrees F oral Pulse rate:   76 / minute BP sitting:   112 / 76  (left arm)  Vitals Entered By: Carolan Clines LPN (July 05, 2010 1:26 PM)  Visit Type:  Initial Consult Referring Provider:  [facility] Primary Care Provider:  Dr. Syliva Overman   History of Present Illness: Sheri Jones is a 65 year old African-American female who presents today for updated colonoscopy. Had one 15 years ago in [place], question of polyps. No reports available. Reports loose stools depending on what she eats. No brbpr, no melena. Denies abdominal pain. Occasional nausea/heaving if eating grease. Not often. No reflux. No dysphagia/odynophagia. Hx of gastric bypass 2004: [place]. Pre-op wt 298, lowest 170s. Up to 209 now. 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 Receives monthly B12 injections. Taking MVI, Ca and D as well.   Current Medications (verified): 1)  Calcium-Vitamin D 500-125 Mg-Unit  Tabs (Calcium-Vitamin D) .... Take 1 Tablet By Mouth Three Times A Day 2)  Aspirin 81 Mg  Tabs (Aspirin) .... Take 1 Tablet By Mouth Once A Day 3)  Hydrochlorothiazide 25 Mg Tabs (Hydrochlorothiazide) .... Take 1 Tablet By Mouth Once A Day 4)  Potassium 99 Mg Tabs (Potassium) .... One Tab By Mouth Qd  Allergies (verified): 1)  ! 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 Eyes:  Denies blurring, irritation, and discharge. ENT:  Denies sore throat, hoarseness, and difficulty swallowing. CV:  Denies chest pains and syncope. Resp:  Denies dyspnea at rest and wheezing. GI:  See HPI. GU:  Denies urinary burning and urinary frequency. MS:  Denies joint pain / LOM, joint swelling, and joint stiffness. Derm:  Denies rash, itching, and dry skin. Neuro:  Denies weakness and syncope. Psych:  Denies depression and anxiety. Endo:  Denies cold intolerance and heat intolerance.  Physical Exam  General:  Well developed, well nourished, no acute distress. Head:  Normocephalic and atraumatic. Eyes:  sclera without icterus Mouth:  No deformity or lesions, dentition normal. Lungs:  Clear throughout to auscultation. Heart:  Regular rate and rhythm; no murmurs, rubs,  or bruits. Abdomen:  +BS, soft, obese, non-tender, non-distended. no HSM, no rebound or guarding. lap site scars well-healed Msk:  Symmetrical with no gross deformities. Normal posture. Pulses:  Normal pulses noted. Extremities:  No clubbing, cyanosis, edema or deformities noted. Neurologic:  Alert and  oriented x4;  grossly normal neurologically. Skin:  Intact without significant lesions or rashes. Psych:  Alert and cooperative. Normal mood and affect.   Impression & Recommendations:  Problem # 1:  SCREENING COLORECTAL-CANCER (ICD-V62.68)  64 year old African-American female with remote hx of colonoscopy 15+ years ago in [place]. Unsure of report, not available at this time. ?of polyps.  Pt unsure. Presents without any signs of melena or brbpr. 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 s/p gastric bypass in 2004. No abdominal pain.    TCS with Dr. Jena Gauss in near future: the R/B/A have been discussed in detail. Pt states  understanding and is ready to proceed. Avoid fatty foods, high sugar content to avoid dumping syndrome  Orders: Consultation Level III (91478)   Orders Added: 1)  Consultation Level III [29562]

## 2010-07-12 LAB — DIFFERENTIAL
Eosinophils Relative: 6 % — ABNORMAL HIGH (ref 0–5)
Lymphocytes Relative: 44 % (ref 12–46)
Lymphs Abs: 2 10*3/uL (ref 0.7–4.0)
Monocytes Absolute: 0.5 10*3/uL (ref 0.1–1.0)
Monocytes Relative: 12 % (ref 3–12)
Neutro Abs: 1.7 10*3/uL (ref 1.7–7.7)

## 2010-07-12 LAB — CBC
HCT: 39.2 % (ref 36.0–46.0)
Hemoglobin: 13.5 g/dL (ref 12.0–15.0)
MCV: 93.9 fL (ref 78.0–100.0)
Platelets: 154 10*3/uL (ref 150–400)
RBC: 4.17 MIL/uL (ref 3.87–5.11)
WBC: 4.5 10*3/uL (ref 4.0–10.5)

## 2010-07-12 LAB — IRON AND TIBC: UIBC: 205 ug/dL

## 2010-07-19 ENCOUNTER — Encounter: Payer: Federal, State, Local not specified - PPO | Admitting: Internal Medicine

## 2010-07-19 ENCOUNTER — Ambulatory Visit (HOSPITAL_COMMUNITY)
Admission: RE | Admit: 2010-07-19 | Discharge: 2010-07-19 | Disposition: A | Discharge status: Home / Self Care | Payer: Federal, State, Local not specified - PPO | Source: Ambulatory Visit | Attending: Internal Medicine | Admitting: Internal Medicine

## 2010-07-19 DIAGNOSIS — I10 Essential (primary) hypertension: Secondary | ICD-10-CM | POA: Insufficient documentation

## 2010-07-19 DIAGNOSIS — K648 Other hemorrhoids: Secondary | ICD-10-CM | POA: Insufficient documentation

## 2010-07-19 DIAGNOSIS — Z1211 Encounter for screening for malignant neoplasm of colon: Secondary | ICD-10-CM | POA: Insufficient documentation

## 2010-07-19 DIAGNOSIS — Z79899 Other long term (current) drug therapy: Secondary | ICD-10-CM | POA: Insufficient documentation

## 2010-07-30 LAB — DIFFERENTIAL
Basophils Relative: 0 % (ref 0–1)
Eosinophils Absolute: 0.2 10*3/uL (ref 0.0–0.7)
Eosinophils Relative: 4 % (ref 0–5)
Monocytes Relative: 12 % (ref 3–12)
Neutrophils Relative %: 38 % — ABNORMAL LOW (ref 43–77)

## 2010-07-30 LAB — CBC
MCHC: 35.1 g/dL (ref 30.0–36.0)
MCV: 92.1 fL (ref 78.0–100.0)
Platelets: 135 10*3/uL — ABNORMAL LOW (ref 150–400)
RBC: 3.95 MIL/uL (ref 3.87–5.11)

## 2010-08-01 NOTE — Op Note (Signed)
  NAME:  Sheri Jones, Sheri Jones                ACCOUNT NO.:  [id number]  MEDICAL RECORD NO.:  [id number]           PATIENT TYPE:  LOCATION:                                 FACILITY:  PHYSICIAN:  R. Roetta Sessions, M.D. DATE OF BIRTH:  19-Jan-1946  DATE OF PROCEDURE: DATE OF DISCHARGE:                              OPERATIVE REPORT   SCREENING COLONOSCOPY  INDICATIONS FOR PROCEDURE:  The patient is a 65 year old lady referred by doctors, Dr. Mariel Sleet and Dr. Syliva Overman, for a screening colonoscopy.  Last colonoscopy was a good 15 years ago.  She has no lower GI tract symptoms.  There is no family history of colon polyps, colon cancer.  Colonoscopy is now being done as standard screening maneuver.  Risks, benefits, limitations, alternatives, and imponderables have been discussed, questions answered.  Please see documentation medical record.  PROCEDURE NOTE:  O2 saturation, blood pressure, pulse, respirations were monitored throughout the entire procedure.  Conscious sedation with Versed 4 mg IV, Demerol 75 mg IV in divided doses.  INSTRUMENT:  Pentax video chip system.  FINDINGS:  Digital rectal exam revealed no abnormalities. Endoscopic Findings:  Prep was suboptimal but doable.  Colon:  Colonic mucosa was surveyed from the rectosigmoid junction through the left transverse, right colon to the appendiceal orifice, ileocecal valve/cecum.  These structures were well seen, photographed for the record.  From this level scope was slowly withdrawn, all previous mucosal surfaces were again seen.  There was granular liquid stool throughout the colon which had to be suctioned out.  The colon was slightly tortuous and elongated, otherwise, colonic mucosa appeared normal.  Scope was pulled down into the rectum where thorough examination of the rectal mucosa, including retroflex view of the anal verge, demonstrated only minimal hemorrhoids and a single anal papilla. The patient tolerated the  procedure well and was reactive to endoscopy. Cecal withdrawal time 8 minutes.  IMPRESSION:  Internal hemorrhoid, anal papilla, otherwise normal rectum, somewhat long tortuous but otherwise normal-appearing colon.  Marginal prep.  RECOMMENDATIONS:  Consider repeat screening colonoscopy in 10 years.     Jonathon Bellows, M.D.     RMR/MEDQ  D:  07/19/2010  T:  07/20/2010  Job:  [id number]  cc:   Milus Mallick. Lodema Hong, M.D. Fax: [phone]  Ladona Horns. Mariel Sleet, MD Fax: [phone]  Electronically Signed by Lorrin Goodell M.D. on 08/01/2010 02:57:59 PM

## 2010-08-02 ENCOUNTER — Encounter (HOSPITAL_COMMUNITY): Payer: Federal, State, Local not specified - PPO | Attending: Oncology

## 2010-08-02 DIAGNOSIS — E538 Deficiency of other specified B group vitamins: Secondary | ICD-10-CM

## 2010-08-02 DIAGNOSIS — Z9884 Bariatric surgery status: Secondary | ICD-10-CM | POA: Insufficient documentation

## 2010-08-04 ENCOUNTER — Other Ambulatory Visit: Payer: Self-pay | Admitting: Family Medicine

## 2010-08-07 LAB — CBC
MCHC: 33.5 g/dL (ref 30.0–36.0)
Platelets: 144 10*3/uL — ABNORMAL LOW (ref 150–400)
RBC: 4.29 MIL/uL (ref 3.87–5.11)

## 2010-08-07 LAB — DIFFERENTIAL
Basophils Absolute: 0 10*3/uL (ref 0.0–0.1)
Basophils Relative: 1 % (ref 0–1)
Monocytes Relative: 13 % — ABNORMAL HIGH (ref 3–12)
Neutro Abs: 1.7 10*3/uL (ref 1.7–7.7)
Neutrophils Relative %: 44 % (ref 43–77)

## 2010-08-14 ENCOUNTER — Encounter: Payer: Self-pay | Admitting: Family Medicine

## 2010-08-15 ENCOUNTER — Encounter: Payer: Self-pay | Admitting: Family Medicine

## 2010-08-16 ENCOUNTER — Encounter: Payer: Self-pay | Admitting: Family Medicine

## 2010-08-16 ENCOUNTER — Ambulatory Visit (INDEPENDENT_AMBULATORY_CARE_PROVIDER_SITE_OTHER): Payer: Federal, State, Local not specified - PPO | Admitting: Family Medicine

## 2010-08-16 ENCOUNTER — Telehealth: Payer: Self-pay | Admitting: Family Medicine

## 2010-08-16 VITALS — BP 112/70 | HR 70 | Resp 16 | Ht 63.0 in | Wt 211.0 lb

## 2010-08-16 DIAGNOSIS — E669 Obesity, unspecified: Secondary | ICD-10-CM

## 2010-08-16 DIAGNOSIS — E538 Deficiency of other specified B group vitamins: Secondary | ICD-10-CM

## 2010-08-16 DIAGNOSIS — M79605 Pain in left leg: Secondary | ICD-10-CM

## 2010-08-16 DIAGNOSIS — I10 Essential (primary) hypertension: Secondary | ICD-10-CM

## 2010-08-16 DIAGNOSIS — M79606 Pain in leg, unspecified: Secondary | ICD-10-CM

## 2010-08-16 DIAGNOSIS — M79609 Pain in unspecified limb: Secondary | ICD-10-CM

## 2010-08-16 DIAGNOSIS — Z23 Encounter for immunization: Secondary | ICD-10-CM

## 2010-08-16 MED ORDER — IBUPROFEN 800 MG PO TABS: 800.0000 mg | ORAL_TABLET | Freq: Two times a day (BID) | ORAL | Status: AC

## 2010-08-16 MED ORDER — KETOROLAC TROMETHAMINE 60 MG/2ML IM SOLN
60.0000 mg | Freq: Once | INTRAMUSCULAR | Status: AC
  Administered 2010-08-16: 60 mg via INTRAMUSCULAR

## 2010-08-16 NOTE — Patient Instructions (Addendum)
F/u in 4 months.  You will get an injection in the office today for leg pain and med is sent in also  Pls also make sure that you wear good supportive shoes.  You are being referred for a bone density scan to see if your bones are too thin  TdAP  today

## 2010-08-16 NOTE — Progress Notes (Signed)
  Subjective:    Patient ID: Sheri Jones, female    DOB: 1945-05-23, 65 y.o.   MRN: [id number]  HPI Left leg pain 3 days ago, started while she was walking , no identified trauma to start the pain, currently a 9, worse with direct pressure currently a 9, took hydrocodone last night  From someone which helped, requesting same. Denies any consistent effort at weight loss, now has decided to reverse this. Here for f/u of chronic problems.   Review of Systems Denies recent fever or chills. Denies sinus pressure, nasal congestion, ear pain or sore throat. Denies chest congestion, productive cough or wheezing. Denies chest pains, palpitations,  and leg swelling Denies abdominal pain, nausea, vomiting,diarrhea or constipation.  ement. Denies dysuria, frequency, hesitancy or incontinence.  Denies headaches, seizure, numbness, or tingling. Denies depression, anxiety or insomnia. Denies skin break down or rash.        Objective:   Physical Exam Patient alert and oriented and in no Cardiopulmonary distress.  HEENT: No facial asymmetry, EOMI, no sinus tenderness,  Neck supple no adenopathy.  Chest: Clear to auscultation bilaterally.  CVS: S1, S2 no murmurs, no S3.  ABD: Soft non tender. Bowel sounds normal.  Ext: No edema  MS: Adequate ROM spine, shoulders, hips and reduced in  knees.  Skin: Intact, no ulcerations or rash noted.  Psych: Good eye contact, normal affect. Memory intact not anxious or depressed appearing.  CNS: CN 2-12 intact, power, tone and sensation normal throughout.        Assessment & Plan:

## 2010-08-21 ENCOUNTER — Other Ambulatory Visit (HOSPITAL_COMMUNITY): Payer: Federal, State, Local not specified - PPO

## 2010-08-23 ENCOUNTER — Ambulatory Visit (HOSPITAL_COMMUNITY)
Admission: RE | Admit: 2010-08-23 | Discharge: 2010-08-23 | Disposition: A | Discharge status: Home / Self Care | Payer: Federal, State, Local not specified - PPO | Source: Ambulatory Visit | Attending: Family Medicine | Admitting: Family Medicine

## 2010-08-23 DIAGNOSIS — Z780 Asymptomatic menopausal state: Secondary | ICD-10-CM | POA: Insufficient documentation

## 2010-08-23 DIAGNOSIS — Z13820 Encounter for screening for osteoporosis: Secondary | ICD-10-CM | POA: Insufficient documentation

## 2010-08-23 DIAGNOSIS — M79605 Pain in left leg: Secondary | ICD-10-CM

## 2010-08-30 ENCOUNTER — Encounter: Payer: Self-pay | Admitting: Family Medicine

## 2010-08-30 DIAGNOSIS — M79606 Pain in leg, unspecified: Secondary | ICD-10-CM | POA: Insufficient documentation

## 2010-08-30 NOTE — Assessment & Plan Note (Signed)
Deteriorated, lifestyle change discussed and encouragd

## 2010-08-30 NOTE — Assessment & Plan Note (Signed)
Controlled, no change in medication  

## 2010-08-30 NOTE — Assessment & Plan Note (Signed)
Receives monthly injections at the hematology clinic

## 2010-08-30 NOTE — Telephone Encounter (Signed)
Patient is aware of the appointment. 

## 2010-08-30 NOTE — Assessment & Plan Note (Signed)
Deteriorated , anti-inflammatory injection administered

## 2010-08-31 ENCOUNTER — Encounter (HOSPITAL_COMMUNITY): Payer: Federal, State, Local not specified - PPO | Attending: Oncology

## 2010-08-31 DIAGNOSIS — E538 Deficiency of other specified B group vitamins: Secondary | ICD-10-CM

## 2010-09-09 ENCOUNTER — Other Ambulatory Visit: Payer: Self-pay | Admitting: Family Medicine

## 2010-09-11 ENCOUNTER — Telehealth: Payer: Self-pay | Admitting: Family Medicine

## 2010-09-11 MED ORDER — HYDROCHLOROTHIAZIDE 25 MG PO TABS: 25.0000 mg | ORAL_TABLET | Freq: Every day | ORAL | Status: DC

## 2010-09-11 NOTE — Telephone Encounter (Signed)
Refill sent as requested. 

## 2010-09-27 ENCOUNTER — Other Ambulatory Visit: Payer: Self-pay | Admitting: Family Medicine

## 2010-09-27 DIAGNOSIS — Z139 Encounter for screening, unspecified: Secondary | ICD-10-CM

## 2010-09-28 ENCOUNTER — Encounter (HOSPITAL_COMMUNITY): Payer: Federal, State, Local not specified - PPO | Attending: Oncology

## 2010-09-28 DIAGNOSIS — Z9884 Bariatric surgery status: Secondary | ICD-10-CM | POA: Insufficient documentation

## 2010-09-28 DIAGNOSIS — E538 Deficiency of other specified B group vitamins: Secondary | ICD-10-CM

## 2010-10-17 ENCOUNTER — Ambulatory Visit (HOSPITAL_COMMUNITY)
Admission: RE | Admit: 2010-10-17 | Discharge: 2010-10-17 | Disposition: A | Discharge status: Home / Self Care | Payer: Federal, State, Local not specified - PPO | Source: Ambulatory Visit | Attending: Family Medicine | Admitting: Family Medicine

## 2010-10-17 DIAGNOSIS — Z139 Encounter for screening, unspecified: Secondary | ICD-10-CM

## 2010-10-17 DIAGNOSIS — Z1231 Encounter for screening mammogram for malignant neoplasm of breast: Secondary | ICD-10-CM | POA: Insufficient documentation

## 2010-10-26 ENCOUNTER — Encounter (HOSPITAL_COMMUNITY): Payer: Federal, State, Local not specified - PPO | Attending: Oncology

## 2010-10-26 DIAGNOSIS — E538 Deficiency of other specified B group vitamins: Secondary | ICD-10-CM | POA: Insufficient documentation

## 2010-10-26 DIAGNOSIS — Z9884 Bariatric surgery status: Secondary | ICD-10-CM | POA: Insufficient documentation

## 2010-11-23 ENCOUNTER — Encounter (HOSPITAL_COMMUNITY): Payer: Federal, State, Local not specified - PPO | Attending: Oncology

## 2010-11-23 DIAGNOSIS — E538 Deficiency of other specified B group vitamins: Secondary | ICD-10-CM

## 2010-11-23 DIAGNOSIS — Z9884 Bariatric surgery status: Secondary | ICD-10-CM | POA: Insufficient documentation

## 2010-11-23 MED ORDER — CYANOCOBALAMIN 1000 MCG/ML IJ SOLN
INTRAMUSCULAR | Status: AC
  Administered 2010-11-23: 1000 ug via INTRAMUSCULAR
  Filled 2010-11-23: qty 1

## 2010-11-23 NOTE — Progress Notes (Signed)
Sheri Jones presents today for injection per MD orders. Vitamin b12 administered IM in the right upper outer buttock.  Administration without incident. Patient tolerated well.

## 2010-11-30 ENCOUNTER — Ambulatory Visit: Payer: Federal, State, Local not specified - PPO | Admitting: Orthopedic Surgery

## 2010-12-18 ENCOUNTER — Ambulatory Visit: Payer: Federal, State, Local not specified - PPO | Admitting: Family Medicine

## 2010-12-20 ENCOUNTER — Other Ambulatory Visit: Payer: Self-pay | Admitting: Family Medicine

## 2010-12-27 ENCOUNTER — Encounter: Payer: Self-pay | Admitting: Family Medicine

## 2010-12-28 ENCOUNTER — Encounter: Payer: Self-pay | Admitting: Family Medicine

## 2010-12-28 ENCOUNTER — Other Ambulatory Visit (HOSPITAL_COMMUNITY): Payer: Self-pay | Admitting: Oncology

## 2010-12-28 ENCOUNTER — Encounter (HOSPITAL_COMMUNITY): Payer: Federal, State, Local not specified - PPO | Attending: Oncology

## 2010-12-28 ENCOUNTER — Ambulatory Visit (INDEPENDENT_AMBULATORY_CARE_PROVIDER_SITE_OTHER): Payer: Federal, State, Local not specified - PPO | Admitting: Family Medicine

## 2010-12-28 VITALS — BP 120/88 | HR 77 | Resp 16 | Ht 63.0 in | Wt 207.1 lb

## 2010-12-28 DIAGNOSIS — M79609 Pain in unspecified limb: Secondary | ICD-10-CM

## 2010-12-28 DIAGNOSIS — I10 Essential (primary) hypertension: Secondary | ICD-10-CM

## 2010-12-28 DIAGNOSIS — Z23 Encounter for immunization: Secondary | ICD-10-CM

## 2010-12-28 DIAGNOSIS — Z9884 Bariatric surgery status: Secondary | ICD-10-CM | POA: Insufficient documentation

## 2010-12-28 DIAGNOSIS — M79606 Pain in leg, unspecified: Secondary | ICD-10-CM

## 2010-12-28 DIAGNOSIS — M1710 Unilateral primary osteoarthritis, unspecified knee: Secondary | ICD-10-CM

## 2010-12-28 DIAGNOSIS — E538 Deficiency of other specified B group vitamins: Secondary | ICD-10-CM

## 2010-12-28 DIAGNOSIS — J309 Allergic rhinitis, unspecified: Secondary | ICD-10-CM

## 2010-12-28 DIAGNOSIS — E669 Obesity, unspecified: Secondary | ICD-10-CM

## 2010-12-28 MED ORDER — CYANOCOBALAMIN 1000 MCG/ML IJ SOLN
1000.0000 ug | Freq: Once | INTRAMUSCULAR | Status: AC
  Administered 2010-12-28: 1000 ug via INTRAMUSCULAR

## 2010-12-28 MED ORDER — CYANOCOBALAMIN 1000 MCG/ML IJ SOLN
INTRAMUSCULAR | Status: AC
  Administered 2010-12-28: 1000 ug via INTRAMUSCULAR
  Filled 2010-12-28: qty 1

## 2010-12-28 NOTE — Patient Instructions (Addendum)
cPE in December.  Flu vaccine and injection for knee pain.  You are referred to dr Romeo Apple  We will provide you with a 1200 and 1500 cal diet sheet, it is vital to eat on a regular schedule.  Goal weight loss is 2 pounds each month

## 2010-12-28 NOTE — Assessment & Plan Note (Signed)
Deteriorated with increased instability

## 2010-12-29 DIAGNOSIS — M1710 Unilateral primary osteoarthritis, unspecified knee: Secondary | ICD-10-CM

## 2010-12-29 LAB — HEMOGLOBIN A1C
Hgb A1c MFr Bld: 5.7 % — ABNORMAL HIGH (ref <5.7)
Mean Plasma Glucose: 117 mg/dL — ABNORMAL HIGH (ref <117)

## 2010-12-29 MED ORDER — INFLUENZA VAC TYPES A & B PF IM SUSP: 0.5000 mL | Freq: Once | INTRAMUSCULAR | Status: DC

## 2010-12-29 MED ORDER — KETOROLAC TROMETHAMINE 30 MG/ML IJ SOLN
60.0000 mg | Freq: Once | INTRAMUSCULAR | Status: AC
  Administered 2010-12-29: 60 mg via INTRAMUSCULAR

## 2010-12-31 NOTE — Progress Notes (Signed)
  Subjective:    Patient ID: Sheri Jones, female    DOB: [birth year], 65 y.o.   MRN: [id number]  HPI The PT is here for follow up and re-evaluation of chronic medical conditions, medication management and review of any available recent lab and radiology data.  Preventive health is updated, specifically  Cancer screening and Immunization.   Questions or concerns regarding consultations or procedures which the PT has had in the interim are  addressed. The PT denies any adverse reactions to current medications since the last visit.  C/o increased right knee pain and instability, requests help Working on weight loss primarily through dietary change      Review of Systems See HPI Denies recent fever or chills. Denies sinus pressure, nasal congestion, ear pain or sore throat. Denies chest congestion, productive cough or wheezing. Denies chest pains, palpitations and leg swelling Denies abdominal pain, nausea, vomiting,diarrhea or constipation.   Denies dysuria, frequency, hesitancy or incontinence.Denies joint pain, swelling and limitation in mobility. Denies headaches, seizures, numbness, or tingling. Denies depression, anxiety or insomnia. Denies skin break down or rash.        Objective:   Physical Exam Patient alert and oriented and in no cardiopulmonary distress.  HEENT: No facial asymmetry, EOMI, no sinus tenderness,  oropharynx pink and moist.  Neck supple no adenopathy.  Chest: Clear to auscultation bilaterally.  CVS: S1, S2 no murmurs, no S3.  ABD: Soft non tender. Bowel sounds normal.  Ext: No edema  MS: Adequate ROM spine, shoulders, hips and reduced in right knee.  Skin: Intact, no ulcerations or rash noted.  Psych: Good eye contact, normal affect. Memory intact not anxious or depressed appearing.  CNS: CN 2-12 intact, power, tone and sensation normal throughout.        Assessment & Plan:

## 2010-12-31 NOTE — Assessment & Plan Note (Signed)
Controlled, no change in medication  

## 2010-12-31 NOTE — Assessment & Plan Note (Signed)
Receives monthly injections through hematology clinic

## 2010-12-31 NOTE — Assessment & Plan Note (Signed)
Improved. Pt applauded on succesful weight loss through lifestyle change, and encouraged to continue same. Weight loss goal set for the next several months.  

## 2011-01-12 LAB — DIFFERENTIAL
Basophils Absolute: 0
Lymphocytes Relative: 44
Lymphs Abs: 1.7
Neutro Abs: 1.7
Neutrophils Relative %: 43

## 2011-01-12 LAB — CBC
HCT: 37
Platelets: 149 — ABNORMAL LOW
RDW: 15
WBC: 3.9 — ABNORMAL LOW

## 2011-01-19 LAB — CBC
HCT: 37
Hemoglobin: 12.4
MCV: 92.8
RBC: 3.98
WBC: 3.7 — ABNORMAL LOW

## 2011-01-19 LAB — DIFFERENTIAL
Basophils Absolute: 0
Lymphocytes Relative: 41
Lymphs Abs: 1.5
Monocytes Absolute: 0.5
Neutro Abs: 1.6 — ABNORMAL LOW

## 2011-01-25 ENCOUNTER — Encounter (HOSPITAL_COMMUNITY): Payer: Federal, State, Local not specified - PPO | Attending: Oncology

## 2011-01-25 VITALS — BP 117/77 | HR 69

## 2011-01-25 DIAGNOSIS — E538 Deficiency of other specified B group vitamins: Secondary | ICD-10-CM

## 2011-01-25 MED ORDER — CYANOCOBALAMIN 1000 MCG/ML IJ SOLN
1000.0000 ug | Freq: Once | INTRAMUSCULAR | Status: AC
  Administered 2011-01-25: 1000 ug via INTRAMUSCULAR

## 2011-01-25 MED ORDER — CYANOCOBALAMIN 1000 MCG/ML IJ SOLN
INTRAMUSCULAR | Status: AC
  Administered 2011-01-25: 1000 ug via INTRAMUSCULAR
  Filled 2011-01-25: qty 1

## 2011-01-25 NOTE — Progress Notes (Signed)
Sheri Jones presents today for injection per MD orders. Vit b 12 administered IM in right gluteal. Administration without incident. Patient tolerated well.

## 2011-01-30 ENCOUNTER — Encounter: Payer: Self-pay | Admitting: Orthopedic Surgery

## 2011-01-30 ENCOUNTER — Other Ambulatory Visit: Payer: Self-pay | Admitting: *Deleted

## 2011-01-30 ENCOUNTER — Ambulatory Visit (INDEPENDENT_AMBULATORY_CARE_PROVIDER_SITE_OTHER): Payer: Federal, State, Local not specified - PPO | Admitting: Orthopedic Surgery

## 2011-01-30 VITALS — Ht 63.0 in | Wt 207.0 lb

## 2011-01-30 DIAGNOSIS — M1710 Unilateral primary osteoarthritis, unspecified knee: Secondary | ICD-10-CM

## 2011-01-30 DIAGNOSIS — M25569 Pain in unspecified knee: Secondary | ICD-10-CM

## 2011-01-30 DIAGNOSIS — G8929 Other chronic pain: Secondary | ICD-10-CM | POA: Insufficient documentation

## 2011-01-30 MED ORDER — ACETAMINOPHEN-CODEINE 300-30 MG PO TABS: 1.0000 | ORAL_TABLET | ORAL | Status: DC | PRN

## 2011-01-30 MED ORDER — METHYLPREDNISOLONE ACETATE 40 MG/ML IJ SUSP: 40.0000 mg | Freq: Once | INTRAMUSCULAR | Status: DC

## 2011-01-30 NOTE — Patient Instructions (Signed)
Ask [employer] "Which rehab facility am I eligible to go to after knee replacement ?"  You have received a steroid shot. 15% of patients experience increased pain at the injection site with in the next 24 hours. This is best treated with ice and tylenol # 3 . If you are still having pain please call the office.

## 2011-01-30 NOTE — Progress Notes (Signed)
RIGHT knee pain.  65 year old female soon to be 65 severe valgus osteoarthritis, RIGHT knee presents back for reevaluation complaining of lateral knee pain, although she's still able to walk for one hour for exercise. She would like to schedule knee replacement surgery. The 1st of next year.  Review of systems otherwise negative.  Exam shows a valgus knee and 120 of flexion. Small flexion contracture. Knee is otherwise stable. Ambulates without assistance. Collateral ligaments are stable. Strength is normal. Skin is intact pulse and temperature are normal. Sensation. The RIGHT knee is also normal.  She has a significant amount of patellofemoral and lateral compartment crepitance.  She would like an injection in her pain relief until she gets her knee replacement.  Her RIGHT knee was injected.  She started on Tylenol #3.  She will come back in December to have preoperative knee films and schedule surgery  Knee  Injection Procedure Note  Pre-operative Diagnosis: right knee oa  Post-operative Diagnosis: same  Indications: pain  Anesthesia: ethyl chloride   Procedure Details   Verbal consent was obtained for the procedure. Time out was completed.The joint was prepped with alcohol, followed by  Ethyl chloride spray and A 20 gauge needle was inserted into the knee via lateral approach; 4ml 1% lidocaine and 1 ml of depomedrol  was then injected into the joint . The needle was removed and the area cleansed and dressed.  Complications:  None; patient tolerated the procedure well.

## 2011-02-05 LAB — CBC
HCT: 36.3
Hemoglobin: 12.3
MCV: 93.2
RDW: 15.1 — ABNORMAL HIGH
WBC: 5.2

## 2011-02-05 LAB — DIFFERENTIAL
Eosinophils Absolute: 0.1
Eosinophils Relative: 1
Lymphocytes Relative: 28
Lymphs Abs: 1.4
Monocytes Absolute: 0.7

## 2011-02-22 ENCOUNTER — Encounter (HOSPITAL_COMMUNITY): Payer: Federal, State, Local not specified - PPO | Attending: Oncology

## 2011-02-22 VITALS — BP 116/81 | HR 70

## 2011-02-22 DIAGNOSIS — E538 Deficiency of other specified B group vitamins: Secondary | ICD-10-CM

## 2011-02-22 MED ORDER — CYANOCOBALAMIN 1000 MCG/ML IJ SOLN
INTRAMUSCULAR | Status: AC
  Administered 2011-02-22: 1000 ug via INTRAMUSCULAR
  Filled 2011-02-22: qty 1

## 2011-02-22 MED ORDER — CYANOCOBALAMIN 1000 MCG/ML IJ SOLN
1000.0000 ug | Freq: Once | INTRAMUSCULAR | Status: AC
  Administered 2011-02-22: 1000 ug via INTRAMUSCULAR

## 2011-02-22 NOTE — Progress Notes (Signed)
Sheri Jones presents today for injection per MD orders. B 12 administered IM in right Lower Back. Administration without incident. Patient tolerated well.

## 2011-02-26 ENCOUNTER — Telehealth: Payer: Self-pay | Admitting: Orthopedic Surgery

## 2011-02-26 ENCOUNTER — Other Ambulatory Visit: Payer: Self-pay | Admitting: Orthopedic Surgery

## 2011-02-26 DIAGNOSIS — M1710 Unilateral primary osteoarthritis, unspecified knee: Secondary | ICD-10-CM

## 2011-02-26 MED ORDER — DICLOFENAC POTASSIUM 50 MG PO TABS: 50.0000 mg | ORAL_TABLET | Freq: Two times a day (BID) | ORAL | Status: DC

## 2011-02-26 NOTE — Telephone Encounter (Signed)
Sheri Jones asked if you will write a prescription for an anti-inflammatory medicine for her right knee.Uses [facility] in [place] [phone]) Sheri Jones can be reached at  h - [phone]  or cell [phone].

## 2011-02-27 ENCOUNTER — Telehealth: Payer: Self-pay | Admitting: Family Medicine

## 2011-02-28 ENCOUNTER — Other Ambulatory Visit: Payer: Self-pay | Admitting: Family Medicine

## 2011-02-28 NOTE — Telephone Encounter (Signed)
Can she have rx for meloxicam?

## 2011-02-28 NOTE — Telephone Encounter (Signed)
Discussed with pt cataflam recently prescribed by ortho, she needs to cll his office for pain meds for the knee. No meloxicam at this time due to new script

## 2011-03-08 NOTE — Telephone Encounter (Signed)
Over the counter alleve or   ibuprfoen work well

## 2011-03-28 ENCOUNTER — Encounter: Payer: Self-pay | Admitting: Family Medicine

## 2011-03-29 ENCOUNTER — Other Ambulatory Visit: Payer: Self-pay | Admitting: Family Medicine

## 2011-03-29 ENCOUNTER — Other Ambulatory Visit (HOSPITAL_COMMUNITY)
Admission: RE | Admit: 2011-03-29 | Discharge: 2011-03-29 | Disposition: A | Discharge status: Home / Self Care | Payer: Federal, State, Local not specified - PPO | Source: Ambulatory Visit | Attending: Family Medicine | Admitting: Family Medicine

## 2011-03-29 ENCOUNTER — Encounter: Payer: Self-pay | Admitting: Family Medicine

## 2011-03-29 ENCOUNTER — Ambulatory Visit (INDEPENDENT_AMBULATORY_CARE_PROVIDER_SITE_OTHER): Payer: Federal, State, Local not specified - PPO | Admitting: Family Medicine

## 2011-03-29 ENCOUNTER — Encounter (HOSPITAL_COMMUNITY): Payer: Federal, State, Local not specified - PPO | Attending: Oncology

## 2011-03-29 VITALS — BP 128/76 | HR 67 | Resp 18 | Ht 63.0 in | Wt 209.1 lb

## 2011-03-29 VITALS — BP 111/78 | HR 73

## 2011-03-29 DIAGNOSIS — Z0000 Encounter for general adult medical examination without abnormal findings: Secondary | ICD-10-CM

## 2011-03-29 DIAGNOSIS — Z139 Encounter for screening, unspecified: Secondary | ICD-10-CM

## 2011-03-29 DIAGNOSIS — Z01419 Encounter for gynecological examination (general) (routine) without abnormal findings: Secondary | ICD-10-CM | POA: Insufficient documentation

## 2011-03-29 DIAGNOSIS — Z13220 Encounter for screening for lipoid disorders: Secondary | ICD-10-CM

## 2011-03-29 DIAGNOSIS — I10 Essential (primary) hypertension: Secondary | ICD-10-CM

## 2011-03-29 DIAGNOSIS — Z1211 Encounter for screening for malignant neoplasm of colon: Secondary | ICD-10-CM

## 2011-03-29 DIAGNOSIS — L680 Hirsutism: Secondary | ICD-10-CM

## 2011-03-29 DIAGNOSIS — E669 Obesity, unspecified: Secondary | ICD-10-CM

## 2011-03-29 DIAGNOSIS — L678 Other hair color and hair shaft abnormalities: Secondary | ICD-10-CM

## 2011-03-29 DIAGNOSIS — R5383 Other fatigue: Secondary | ICD-10-CM

## 2011-03-29 DIAGNOSIS — R5381 Other malaise: Secondary | ICD-10-CM

## 2011-03-29 DIAGNOSIS — E538 Deficiency of other specified B group vitamins: Secondary | ICD-10-CM

## 2011-03-29 LAB — HEMOCCULT GUIAC POC 1CARD (OFFICE): Fecal Occult Blood, POC: NEGATIVE

## 2011-03-29 MED ORDER — EFLORNITHINE HCL 13.9 % EX CREA: TOPICAL_CREAM | CUTANEOUS | Status: DC

## 2011-03-29 MED ORDER — CYANOCOBALAMIN 1000 MCG/ML IJ SOLN
INTRAMUSCULAR | Status: AC
  Administered 2011-03-29: 1000 ug via INTRAMUSCULAR
  Filled 2011-03-29: qty 1

## 2011-03-29 MED ORDER — CYANOCOBALAMIN 1000 MCG/ML IJ SOLN
1000.0000 ug | Freq: Once | INTRAMUSCULAR | Status: AC
  Administered 2011-03-29: 1000 ug via INTRAMUSCULAR

## 2011-03-29 NOTE — Patient Instructions (Addendum)
F/u in 4 months.  All the best with the surgery on your right knee.  CBC, fasting chem 7, lipid, TSH today and vit D.  Pls have only fresh  Fruit and vegetable at home for snacks, and canned fruit in light syrup is fine.  Water or diet drinks only  No med changes  Weight loss goal of  2 pounds per month

## 2011-03-29 NOTE — Progress Notes (Signed)
  Subjective:    Patient ID: Sheri Jones, female    DOB: [birth year], 65 y.o.   MRN: [id number]  HPI The PT is here for annual exam and re-evaluation of chronic medical conditions, medication management and review of any available recent lab and radiology data.  Preventive health is updated, specifically  Cancer screening and Immunization.   Questions or concerns regarding consultations or procedures which the PT has had in the interim are  addressed. The PT denies any adverse reactions to current medications since the last visit.  There are no new concerns. States she has upcoming right knee surgery       Review of Systems See HPI Denies recent fever or chills. Denies sinus pressure, nasal congestion, ear pain or sore throat. Denies chest congestion, productive cough or wheezing. Denies chest pains, palpitations and leg swelling Denies abdominal pain, nausea, vomiting,diarrhea or constipation.   Denies dysuria, frequency, hesitancy or incontinence.  Denies headaches, seizures, numbness, or tingling. Denies depression, anxiety or insomnia. Denies skin break down or rash.        Objective:   Physical Exam Pleasant well nourished female, alert and oriented x 3, in no cardio-pulmonary distress. Afebrile. HEENT No facial trauma or asymetry. Sinuses non tender.  EOMI, PERTL, fundoscopic exam is normal, no hemorhage or exudate.  External ears normal, tympanic membranes clear. Oropharynx moist, no exudate,poor  dentition. Neck: supple, no adenopathy,JVD or thyromegaly.No bruits.  Chest: Clear to ascultation bilaterally.No crackles or wheezes. Non tender to palpation  Breast: No asymetry,no masses. No nipple discharge or inversion. No axillary or supraclavicular adenopathy  Cardiovascular system; Heart sounds normal,  S1 and  S2 ,no S3.  No murmur, or thrill. Apical beat not displaced Peripheral pulses normal.  Abdomen: Soft, non tender, no organomegaly or  masses. No bruits. Bowel sounds normal. No guarding, tenderness or rebound.  Rectal:  No mass. Guaiac negative stool.  GU: External genitalia normal. No lesions. Vaginal canal normal.No discharge. Uterus normal size, no adnexal masses, no cervical motion or adnexal tenderness.  Musculoskeletal exam: Decreased ROM of spine,  and knees.Adequatye in shoulders and hips No deformity ,swelling or crepitus noted. No muscle wasting or atrophy.   Neurologic: Cranial nerves 2 to 12 intact. Power, tone ,sensation and reflexes normal throughout. No disturbance in gait. No tremor.  Skin: Intact, no ulceration, erythema , scaling or rash noted. Pigmentation normal throughout  Psych; Normal mood and affect. Judgement and concentration normal        Assessment & Plan:

## 2011-03-29 NOTE — Progress Notes (Signed)
Sheri Jones presents today for injection per MD orders. B12 1000 mcg administered SQ in right gluteal. Administration without incident. Patient tolerated well.  

## 2011-03-30 LAB — CBC WITH DIFFERENTIAL/PLATELET
Basophils Absolute: 0 10*3/uL (ref 0.0–0.1)
Basophils Relative: 1 % (ref 0–1)
Eosinophils Absolute: 0.2 10*3/uL (ref 0.0–0.7)
Eosinophils Relative: 5 % (ref 0–5)
HCT: 39.7 % (ref 36.0–46.0)
MCHC: 34 g/dL (ref 30.0–36.0)
MCV: 90.2 fL (ref 78.0–100.0)
Monocytes Absolute: 0.5 10*3/uL (ref 0.1–1.0)
Platelets: 165 10*3/uL (ref 150–400)
RDW: 15.8 % — ABNORMAL HIGH (ref 11.5–15.5)

## 2011-03-30 LAB — BASIC METABOLIC PANEL
BUN: 20 mg/dL (ref 6–23)
Calcium: 8.8 mg/dL (ref 8.4–10.5)
Chloride: 104 mEq/L (ref 96–112)
Creat: 0.77 mg/dL (ref 0.50–1.10)

## 2011-03-30 LAB — LIPID PANEL
Cholesterol: 176 mg/dL (ref 0–200)
LDL Cholesterol: 95 mg/dL (ref 0–99)
Triglycerides: 48 mg/dL (ref <150)

## 2011-03-30 MED ORDER — VITAMIN D (ERGOCALCIFEROL) 1.25 MG (50000 UNIT) PO CAPS: 50000.0000 [IU] | ORAL_CAPSULE | ORAL | Status: DC

## 2011-03-30 NOTE — Assessment & Plan Note (Signed)
Pt requests vaniqa which is prescribed

## 2011-03-30 NOTE — Assessment & Plan Note (Signed)
Deteriorated. Patient re-educated about  the importance of commitment to a  minimum of 150 minutes of exercise per week. The importance of healthy food choices with portion control discussed. Encouraged to start a food diary, count calories and to consider  joining a support group. Sample diet sheets offered. Goals set by the patient for the next several months.    

## 2011-03-30 NOTE — Assessment & Plan Note (Signed)
Controlled, no change in medication  

## 2011-04-04 ENCOUNTER — Encounter: Payer: Self-pay | Admitting: Orthopedic Surgery

## 2011-04-04 ENCOUNTER — Ambulatory Visit (INDEPENDENT_AMBULATORY_CARE_PROVIDER_SITE_OTHER): Payer: Federal, State, Local not specified - PPO | Admitting: Orthopedic Surgery

## 2011-04-04 VITALS — BP 130/70 | Ht 63.0 in | Wt 207.0 lb

## 2011-04-04 DIAGNOSIS — M1710 Unilateral primary osteoarthritis, unspecified knee: Secondary | ICD-10-CM

## 2011-04-04 NOTE — Progress Notes (Signed)
   Chief complaint followup RIGHT knee.  Diagnosis osteoarthritis, RIGHT knee.  Six-month x-ray.  Complains of moderate RIGHT knee pain daily, but not constantly throughout the day. Pain is relieved by diclofenac and occasional Tylenol with codeine.  Activities which are difficult and stair climbing and there appears to be some days where she is having trouble going up and down stairs were that the legs might give out. Otherwise, her activities of daily living, such as getting in and out of a car and getting on and off the toilet. Her normal.  Past Medical History  Diagnosis Date  . Allergic rhinitis due to pollen   . Osteoarthrosis, unspecified whether generalized or localized, lower leg   . Obesity, unspecified   . Unspecified essential hypertension   . Anemia    Past Surgical History  Procedure Date  . Gastric bypass 2004  . Colonoscopy 2008   Exam vital signs are as recorded stable. Gen. Appearance, normal. She is oriented x3. The RIGHT knee is in valgus. There is mild crepitation. Range of motion is 102. Stability normal. Strength normal. Skin normal.  X-rays show severe valgus arthritis.  The patient is not symptomatic to the point of excruciating pain or loss of function, and we decided to put the surgery off and x-ray again in 6 months. She can continue diclofenac and Tylenol with codeine.  3 views RIGHT knee for RIGHT knee arthritis.  The lateral compartment is collapsed. There are marginal osteophytes and subchondral cyst formation. There is patellofemoral arthritis and osteophyte.  Impression valgus osteoarthritis, severe.

## 2011-04-04 NOTE — Patient Instructions (Addendum)
Diclofenac Take daily  Tylenol/codeine take when pain is severe

## 2011-04-26 ENCOUNTER — Encounter (HOSPITAL_COMMUNITY): Payer: Federal, State, Local not specified - PPO | Attending: Oncology

## 2011-04-26 DIAGNOSIS — E538 Deficiency of other specified B group vitamins: Secondary | ICD-10-CM | POA: Insufficient documentation

## 2011-04-26 MED ORDER — CYANOCOBALAMIN 1000 MCG/ML IJ SOLN
1000.0000 ug | Freq: Once | INTRAMUSCULAR | Status: AC
  Administered 2011-04-26: 1000 ug via INTRAMUSCULAR

## 2011-04-26 MED ORDER — CYANOCOBALAMIN 1000 MCG/ML IJ SOLN
INTRAMUSCULAR | Status: AC
  Filled 2011-04-26: qty 1

## 2011-04-26 NOTE — Progress Notes (Signed)
Sheri Jones presents today for injection per MD orders. B12 1,056mcg administered SQ in left Upper Arm. Administration without incident. Patient tolerated well.

## 2011-04-27 ENCOUNTER — Ambulatory Visit (HOSPITAL_COMMUNITY): Payer: Federal, State, Local not specified - PPO

## 2011-05-24 ENCOUNTER — Ambulatory Visit (HOSPITAL_COMMUNITY): Payer: Federal, State, Local not specified - PPO

## 2011-05-24 ENCOUNTER — Encounter (HOSPITAL_BASED_OUTPATIENT_CLINIC_OR_DEPARTMENT_OTHER): Payer: Federal, State, Local not specified - PPO

## 2011-05-24 DIAGNOSIS — E538 Deficiency of other specified B group vitamins: Secondary | ICD-10-CM

## 2011-05-24 LAB — CBC
HCT: 38.6 % (ref 36.0–46.0)
MCV: 89.4 fL (ref 78.0–100.0)
RDW: 14.8 % (ref 11.5–15.5)
WBC: 4 10*3/uL (ref 4.0–10.5)

## 2011-05-24 MED ORDER — CYANOCOBALAMIN 1000 MCG/ML IJ SOLN
INTRAMUSCULAR | Status: AC
  Administered 2011-05-24: 1000 ug via INTRAMUSCULAR
  Filled 2011-05-24: qty 1

## 2011-05-24 MED ORDER — CYANOCOBALAMIN 1000 MCG/ML IJ SOLN
1000.0000 ug | Freq: Once | INTRAMUSCULAR | Status: AC
  Administered 2011-05-24: 1000 ug via INTRAMUSCULAR

## 2011-05-24 NOTE — Progress Notes (Signed)
Sheri Jones presented for [employer]. Labs per MD order drawn via Peripheral Line 23 gauge needle inserted in right antecubital.  Good blood return present. Procedure without incident.  Needle removed intact. Patient tolerated procedure well.  Sheri Jones presents today for injection per MD orders. B12 1000 mcg administered IM in left Upper Arm. Administration without incident. Patient tolerated well.

## 2011-05-29 ENCOUNTER — Other Ambulatory Visit: Payer: Self-pay | Admitting: Family Medicine

## 2011-06-04 ENCOUNTER — Other Ambulatory Visit (HOSPITAL_COMMUNITY): Payer: Federal, State, Local not specified - PPO

## 2011-06-06 ENCOUNTER — Encounter (HOSPITAL_COMMUNITY): Payer: Federal, State, Local not specified - PPO | Attending: Oncology | Admitting: Oncology

## 2011-06-06 VITALS — BP 124/79 | HR 77 | Temp 97.9°F | Wt 211.8 lb

## 2011-06-06 DIAGNOSIS — I10 Essential (primary) hypertension: Secondary | ICD-10-CM

## 2011-06-06 DIAGNOSIS — Z9884 Bariatric surgery status: Secondary | ICD-10-CM

## 2011-06-06 DIAGNOSIS — E538 Deficiency of other specified B group vitamins: Secondary | ICD-10-CM | POA: Insufficient documentation

## 2011-06-06 NOTE — Progress Notes (Signed)
This office note has been dictated.

## 2011-06-06 NOTE — Patient Instructions (Signed)
[facility]  Discharge Instructions  RECOMMENDATIONS MADE BY THE CONSULTANT AND ANY TEST RESULTS WILL BE SENT TO YOUR REFERRING DOCTOR.  Exam good today. You will continue taking the B12 injections monthly. We will do lab work again in 6 months and 12 months. You will see the doctor again in 1 year.  I acknowledge that I have been informed and understand all the instructions given to me and received a copy. I do not have any more questions at this time, but understand that I may call the Specialty Clinic at [facility] at [phone] during business hours should I have any further questions or need assistance in obtaining follow-up care.    __________________________________________  _____________  __________ Signature of Patient or Authorized Representative            Date                   Time    __________________________________________ Nurse's Signature

## 2011-06-06 NOTE — Progress Notes (Signed)
CC:   Sheri Jones. Sheri Jones, M.D.  DIAGNOSIS: 1. 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 2. Obesity in the past, but still weighing 211 pounds today, but much     less than she did before the gastric bypass surgery. 3. Gastric bypass surgery in January 2004. 4. Thrombocytopenia in the past which has resolved. 5. Intermittent leukopenia in the past which has resolved. 6. History of hypertension and she is still on medication for that. Sheri Jones is feeling great.  She has no complaints today on review of systems.  She looks good.  Unfortunately, her weight is significantly higher than it should be so we talked about what she needs to do in the way of dietary changes.  She has already eliminated regular soft drinks. She is getting away from desserts more and more but she eats a lot of potatoes and pasta.  She is getting a little bit of exercise, which she needs to ramp up.  So we went over all the good foods that she needs to consume and she states that she will work on that.  In the meantime, will get her back for monthly B12 shots.  In 6 months and 12 months, she will get a CBC and I will see her in 12 months, sooner only if she needs to.    ______________________________ Ladona Horns. Mariel Sleet, MD ESN/MEDQ  D:  06/06/2011  T:  06/06/2011  Job:  [id number]

## 2011-06-21 ENCOUNTER — Encounter (HOSPITAL_BASED_OUTPATIENT_CLINIC_OR_DEPARTMENT_OTHER): Payer: Federal, State, Local not specified - PPO

## 2011-06-21 DIAGNOSIS — E538 Deficiency of other specified B group vitamins: Secondary | ICD-10-CM

## 2011-06-21 MED ORDER — CYANOCOBALAMIN 1000 MCG/ML IJ SOLN
INTRAMUSCULAR | Status: AC
  Administered 2011-06-21: 1000 ug via INTRAMUSCULAR
  Filled 2011-06-21: qty 1

## 2011-06-21 MED ORDER — CYANOCOBALAMIN 1000 MCG/ML IJ SOLN
1000.0000 ug | Freq: Once | INTRAMUSCULAR | Status: AC
  Administered 2011-06-21: 1000 ug via INTRAMUSCULAR

## 2011-06-21 NOTE — Progress Notes (Signed)
Sheri Jones presents today for injection per MD orders. B12 1000 mcg administered SQ in left Upper Arm. Administration without incident. Patient tolerated well.

## 2011-07-19 ENCOUNTER — Encounter (HOSPITAL_COMMUNITY): Payer: Federal, State, Local not specified - PPO | Attending: Oncology

## 2011-07-19 VITALS — BP 111/76 | HR 62

## 2011-07-19 DIAGNOSIS — E538 Deficiency of other specified B group vitamins: Secondary | ICD-10-CM

## 2011-07-19 MED ORDER — CYANOCOBALAMIN 1000 MCG/ML IJ SOLN
1000.0000 ug | Freq: Once | INTRAMUSCULAR | Status: AC
  Administered 2011-07-19: 1000 ug via INTRAMUSCULAR

## 2011-07-19 MED ORDER — CYANOCOBALAMIN 1000 MCG/ML IJ SOLN
INTRAMUSCULAR | Status: AC
  Filled 2011-07-19: qty 1

## 2011-07-19 NOTE — Progress Notes (Signed)
Sheri Jones presents today for injection per the provider's orders.  Vit B12 administered administration without incident; see MAR for injection details.  Patient tolerated procedure well and without incident.  No questions or complaints noted at this time.  

## 2011-07-31 ENCOUNTER — Ambulatory Visit: Payer: Federal, State, Local not specified - PPO | Admitting: Family Medicine

## 2011-08-06 ENCOUNTER — Encounter: Payer: Self-pay | Admitting: Family Medicine

## 2011-08-06 ENCOUNTER — Ambulatory Visit (INDEPENDENT_AMBULATORY_CARE_PROVIDER_SITE_OTHER): Payer: Federal, State, Local not specified - PPO | Admitting: Family Medicine

## 2011-08-06 VITALS — BP 118/74 | HR 72 | Resp 18 | Ht 63.0 in | Wt 211.0 lb

## 2011-08-06 DIAGNOSIS — M1710 Unilateral primary osteoarthritis, unspecified knee: Secondary | ICD-10-CM

## 2011-08-06 DIAGNOSIS — E559 Vitamin D deficiency, unspecified: Secondary | ICD-10-CM

## 2011-08-06 DIAGNOSIS — E538 Deficiency of other specified B group vitamins: Secondary | ICD-10-CM

## 2011-08-06 DIAGNOSIS — E669 Obesity, unspecified: Secondary | ICD-10-CM

## 2011-08-06 DIAGNOSIS — I10 Essential (primary) hypertension: Secondary | ICD-10-CM

## 2011-08-06 DIAGNOSIS — R7303 Prediabetes: Secondary | ICD-10-CM

## 2011-08-06 DIAGNOSIS — R7309 Other abnormal glucose: Secondary | ICD-10-CM

## 2011-08-06 NOTE — Progress Notes (Signed)
  Subjective:    Patient ID: Sheri Jones, female    DOB: 1945-05-19, 66 y.o.   MRN: [id number]  HPI The PT is here for follow up and re-evaluation of chronic medical conditions, medication management and review of any available recent lab and radiology data.  Preventive health is updated, specifically  Cancer screening and Immunization.   Questions or concerns regarding consultations or procedures which the PT has had in the interim are  addressed. The PT denies any adverse reactions to current medications since the last visit.  There are no new concerns.She is frustrated that despite exercise, her weight is stable, however , she reports marked fluctuation in diet  Chronic joint pains unchanged   Review of Systems See HPI Denies recent fever or chills. Denies sinus pressure, nasal congestion, ear pain or sore throat. Denies chest congestion, productive cough or wheezing. Denies chest pains, palpitations and leg swelling Denies abdominal pain, nausea, vomiting,diarrhea or constipation.   Denies dysuria, frequency, hesitancy or incontinence. Denies joint pain, swelling and limitation in mobility. Denies headaches, seizures, numbness, or tingling. Denies depression, does have increased anxiety over her mother's failing health, and describes poor relationships with her other sister, the main caregiver at times over this. Denies skin break down or rash.        Objective:   Physical Exam Patient alert and oriented and in no cardiopulmonary distress.  HEENT: No facial asymmetry, EOMI, no sinus tenderness,  oropharynx pink and moist.  Neck supple no adenopathy.  Chest: Clear to auscultation bilaterally.  CVS: S1, S2 no murmurs, no S3.  ABD: Soft non tender. Bowel sounds normal.  Ext: No edema  MS: Adequate ROM spine, shoulders, hips and reduced in knees.  Skin: Intact, no ulcerations or rash noted.  Psych: Good eye contact, normal affect. Memory intact not anxious or  depressed appearing.  CNS: CN 2-12 intact, power, tone and sensation normal throughout.        Assessment & Plan:

## 2011-08-06 NOTE — Patient Instructions (Signed)
F/u in 3 month  Mammogram needs to be scheduled for  May.Due May 3 or after.  It is important that you exercise regularly at least 30 minutes 5 times a week. If you develop chest pain, have severe difficulty breathing, or feel very tired, stop exercising immediately and seek medical attention    A healthy diet is rich in fruit, vegetables and whole grains. Poultry fish, nuts and beans are a healthy choice for protein rather then red meat. A low sodium diet and drinking 64 ounces of water daily is generally recommended. Oils and sweet should be limited. Carbohydrates especially for those who are diabetic or overweight, should be limited to 30-45 gram per meal. It is important to eat on a regular schedule, at least 3 times daily. Snacks should be primarily fruits, vegetables or nuts.  Weight  Loss goal of 2 to 3 pounds  Per month  Blood pressure is excellent  Pleaser try and stay within 1500 calories per day

## 2011-08-07 DIAGNOSIS — E559 Vitamin D deficiency, unspecified: Secondary | ICD-10-CM | POA: Insufficient documentation

## 2011-08-07 DIAGNOSIS — R7303 Prediabetes: Secondary | ICD-10-CM | POA: Insufficient documentation

## 2011-08-07 LAB — HEMOGLOBIN A1C: Hgb A1c MFr Bld: 6 % — ABNORMAL HIGH (ref <5.7)

## 2011-08-07 MED ORDER — METFORMIN HCL 500 MG PO TABS: 500.0000 mg | ORAL_TABLET | Freq: Every day | ORAL | Status: DC

## 2011-08-07 MED ORDER — VITAMIN D (ERGOCALCIFEROL) 1.25 MG (50000 UNIT) PO CAPS: 50000.0000 [IU] | ORAL_CAPSULE | ORAL | Status: DC

## 2011-08-07 NOTE — Assessment & Plan Note (Signed)
Continues to receive monthly injections through the specialty clinic

## 2011-08-07 NOTE — Assessment & Plan Note (Signed)
Deteriorated. Patient re-educated about  the importance of commitment to a  minimum of 150 minutes of exercise per week. The importance of healthy food choices with portion control discussed. Encouraged to start a food diary, count calories and to consider  joining a support group. Sample diet sheets offered. Goals set by the patient for the next several months.    

## 2011-08-07 NOTE — Assessment & Plan Note (Signed)
Controlled, no change in medication  

## 2011-08-07 NOTE — Assessment & Plan Note (Signed)
Worsened blood sugars, pt to attend diabetic ed class and start daily metformin

## 2011-08-07 NOTE — Assessment & Plan Note (Signed)
Still deficient, chronic bone pain , need supplements

## 2011-08-20 ENCOUNTER — Encounter (HOSPITAL_COMMUNITY): Payer: Federal, State, Local not specified - PPO | Attending: Oncology

## 2011-08-20 VITALS — BP 121/77 | HR 70

## 2011-08-20 DIAGNOSIS — E538 Deficiency of other specified B group vitamins: Secondary | ICD-10-CM

## 2011-08-20 MED ORDER — CYANOCOBALAMIN 1000 MCG/ML IJ SOLN
INTRAMUSCULAR | Status: AC
  Filled 2011-08-20: qty 1

## 2011-08-20 MED ORDER — CYANOCOBALAMIN 1000 MCG/ML IJ SOLN
1000.0000 ug | Freq: Once | INTRAMUSCULAR | Status: AC
  Administered 2011-08-20: 1000 ug via INTRAMUSCULAR

## 2011-08-20 NOTE — Progress Notes (Signed)
Sheri Jones presents today for injection per MD orders. B12 1000 mcg administered SQ in right Gluteal. Administration without incident. Patient tolerated well.  

## 2011-09-10 ENCOUNTER — Other Ambulatory Visit: Payer: Self-pay

## 2011-09-10 MED ORDER — HYDROCHLOROTHIAZIDE 25 MG PO TABS: ORAL_TABLET | ORAL | Status: DC

## 2011-09-12 ENCOUNTER — Encounter (HOSPITAL_COMMUNITY): Payer: Federal, State, Local not specified - PPO | Attending: Oncology

## 2011-09-12 VITALS — BP 112/77

## 2011-09-12 DIAGNOSIS — E538 Deficiency of other specified B group vitamins: Secondary | ICD-10-CM

## 2011-09-12 MED ORDER — CYANOCOBALAMIN 1000 MCG/ML IJ SOLN
INTRAMUSCULAR | Status: AC
  Filled 2011-09-12: qty 1

## 2011-09-12 MED ORDER — CYANOCOBALAMIN 1000 MCG/ML IJ SOLN
1000.0000 ug | Freq: Once | INTRAMUSCULAR | Status: AC
  Administered 2011-09-12: 1000 ug via INTRAMUSCULAR

## 2011-09-12 NOTE — Progress Notes (Signed)
Sheri Jones presents today for injection per MD orders. B12 administered SQ in right :[id number]} Gluteal. Administration without incident. Patient tolerated well.

## 2011-09-14 ENCOUNTER — Other Ambulatory Visit: Payer: Self-pay | Admitting: Family Medicine

## 2011-09-14 DIAGNOSIS — IMO0001 Reserved for inherently not codable concepts without codable children: Secondary | ICD-10-CM

## 2011-09-18 ENCOUNTER — Ambulatory Visit (HOSPITAL_COMMUNITY): Payer: Federal, State, Local not specified - PPO

## 2011-10-04 ENCOUNTER — Ambulatory Visit (INDEPENDENT_AMBULATORY_CARE_PROVIDER_SITE_OTHER): Payer: Federal, State, Local not specified - PPO | Admitting: Orthopedic Surgery

## 2011-10-04 ENCOUNTER — Ambulatory Visit (INDEPENDENT_AMBULATORY_CARE_PROVIDER_SITE_OTHER): Payer: Federal, State, Local not specified - PPO

## 2011-10-04 ENCOUNTER — Encounter: Payer: Self-pay | Admitting: Orthopedic Surgery

## 2011-10-04 VITALS — BP 100/60 | Ht 63.0 in | Wt 208.0 lb

## 2011-10-04 DIAGNOSIS — M25569 Pain in unspecified knee: Secondary | ICD-10-CM

## 2011-10-04 DIAGNOSIS — M1710 Unilateral primary osteoarthritis, unspecified knee: Secondary | ICD-10-CM

## 2011-10-04 MED ORDER — DICLOFENAC POTASSIUM 50 MG PO TABS: 50.0000 mg | ORAL_TABLET | Freq: Two times a day (BID) | ORAL | Status: DC

## 2011-10-04 NOTE — Progress Notes (Signed)
Patient ID: Sheri Jones, female   DOB: 03-05-1946, 66 y.o.   MRN: [id number] Chief Complaint  Patient presents with  . Follow-up    6 month recheck on right knee.   Ht 5\' 3"  (1.6 m)  Wt 208 lb (94.348 kg)  BMI 36.85 kg/m2  The patient presents back for a routine followup visit for x-rays of her severely arthritic and valgus right knee. She is on diclofenac and has no pain at this time she is functioning well walking for exercise and try to get her weight down.  Her review of systems is negative for catching locking or giving way  Her exam shows her flexion arc is only 90 has a severe valgus deformity of the right knee. Her leg is stable at the knee joint strength is normal skin is intact pulses are good sensation is normal  X-rays show 12 valgus deformity severe lateral compartment loss of joint space multiple osteophytes bone spurs and sclerosis and cyst formation  Impression severe osteoarthritis right knee with valgus controlled with Cataflam  Continue diclofenac followup in one year for repeat x-ray

## 2011-10-04 NOTE — Patient Instructions (Signed)
Continue medication.

## 2011-10-16 ENCOUNTER — Encounter (HOSPITAL_COMMUNITY): Payer: Federal, State, Local not specified - PPO | Attending: Oncology

## 2011-10-16 VITALS — BP 122/73 | HR 74

## 2011-10-16 DIAGNOSIS — E538 Deficiency of other specified B group vitamins: Secondary | ICD-10-CM | POA: Insufficient documentation

## 2011-10-16 MED ORDER — CYANOCOBALAMIN 1000 MCG/ML IJ SOLN
INTRAMUSCULAR | Status: AC
  Filled 2011-10-16: qty 1

## 2011-10-16 MED ORDER — CYANOCOBALAMIN 1000 MCG/ML IJ SOLN
1000.0000 ug | Freq: Once | INTRAMUSCULAR | Status: AC
  Administered 2011-10-16: 1000 ug via INTRAMUSCULAR

## 2011-10-16 NOTE — Progress Notes (Signed)
Tolerated injection well. 

## 2011-11-08 ENCOUNTER — Ambulatory Visit: Payer: Federal, State, Local not specified - PPO | Admitting: Family Medicine

## 2011-11-13 ENCOUNTER — Ambulatory Visit (HOSPITAL_COMMUNITY)
Admission: RE | Admit: 2011-11-13 | Discharge: 2011-11-13 | Disposition: A | Discharge status: Home / Self Care | Payer: Federal, State, Local not specified - PPO | Source: Ambulatory Visit | Attending: Family Medicine | Admitting: Family Medicine

## 2011-11-13 ENCOUNTER — Encounter (HOSPITAL_COMMUNITY): Payer: Federal, State, Local not specified - PPO | Attending: Oncology

## 2011-11-13 DIAGNOSIS — IMO0001 Reserved for inherently not codable concepts without codable children: Secondary | ICD-10-CM

## 2011-11-13 DIAGNOSIS — E538 Deficiency of other specified B group vitamins: Secondary | ICD-10-CM | POA: Insufficient documentation

## 2011-11-13 DIAGNOSIS — Z1231 Encounter for screening mammogram for malignant neoplasm of breast: Secondary | ICD-10-CM | POA: Insufficient documentation

## 2011-11-13 LAB — CBC
Hemoglobin: 12.7 g/dL (ref 12.0–15.0)
MCH: 31.3 pg (ref 26.0–34.0)
MCV: 90.4 fL (ref 78.0–100.0)
RBC: 4.06 MIL/uL (ref 3.87–5.11)

## 2011-11-13 MED ORDER — CYANOCOBALAMIN 1000 MCG/ML IJ SOLN
INTRAMUSCULAR | Status: AC
  Filled 2011-11-13: qty 1

## 2011-11-13 MED ORDER — CYANOCOBALAMIN 1000 MCG/ML IJ SOLN: 1000.0000 ug | Freq: Once | INTRAMUSCULAR | Status: DC

## 2011-11-13 MED ORDER — CYANOCOBALAMIN 1000 MCG/ML IJ SOLN
1000.0000 ug | Freq: Once | INTRAMUSCULAR | Status: AC
  Administered 2011-11-13: 1000 ug via INTRAMUSCULAR

## 2011-11-13 NOTE — Progress Notes (Signed)
Sheri Jones presents today for injection per MD orders. B12 1000mcg administered SQ in left Gluteal. Administration without incident. Patient tolerated well.  

## 2011-11-13 NOTE — Progress Notes (Signed)
Labs drawn today for cbc 

## 2011-11-20 ENCOUNTER — Encounter (HOSPITAL_COMMUNITY): Payer: Self-pay | Admitting: Dietician

## 2011-11-20 NOTE — Progress Notes (Signed)
[facility] Diabetes Class Completion  Date:November 20, 2011  Time: 10:00 AM  Pt attended [facility]'s Diabetes Class on November 20, 2011.   Patient was educated on the following topics: carbohydrate metabolism in relation to diabetes, sources of carbohydrate, carbohydrate counting, meal planning strategies, food label reading, and portion control.   Melody Haver, RD, LDN Date:November 20, 2011 Time: 10:00 AM

## 2011-11-28 ENCOUNTER — Encounter: Payer: Self-pay | Admitting: Family Medicine

## 2011-11-28 ENCOUNTER — Ambulatory Visit (INDEPENDENT_AMBULATORY_CARE_PROVIDER_SITE_OTHER): Payer: Federal, State, Local not specified - PPO | Admitting: Family Medicine

## 2011-11-28 VITALS — BP 118/78 | HR 66 | Resp 18 | Ht 63.0 in | Wt 208.0 lb

## 2011-11-28 DIAGNOSIS — R5383 Other fatigue: Secondary | ICD-10-CM

## 2011-11-28 DIAGNOSIS — I10 Essential (primary) hypertension: Secondary | ICD-10-CM

## 2011-11-28 DIAGNOSIS — E538 Deficiency of other specified B group vitamins: Secondary | ICD-10-CM

## 2011-11-28 DIAGNOSIS — R5381 Other malaise: Secondary | ICD-10-CM

## 2011-11-28 DIAGNOSIS — R7303 Prediabetes: Secondary | ICD-10-CM

## 2011-11-28 DIAGNOSIS — R7309 Other abnormal glucose: Secondary | ICD-10-CM

## 2011-11-28 DIAGNOSIS — E669 Obesity, unspecified: Secondary | ICD-10-CM

## 2011-11-28 DIAGNOSIS — Z13220 Encounter for screening for lipoid disorders: Secondary | ICD-10-CM

## 2011-11-28 LAB — BASIC METABOLIC PANEL
BUN: 16 mg/dL (ref 6–23)
Chloride: 105 mEq/L (ref 96–112)
Potassium: 3.9 mEq/L (ref 3.5–5.3)

## 2011-11-28 LAB — HEMOGLOBIN A1C: Hgb A1c MFr Bld: 5.7 % — ABNORMAL HIGH (ref <5.7)

## 2011-11-28 MED ORDER — METFORMIN HCL 500 MG PO TABS: 500.0000 mg | ORAL_TABLET | Freq: Three times a day (TID) | ORAL | Status: DC

## 2011-11-28 NOTE — Patient Instructions (Addendum)
cPE December 12 or after.  hBA1C and chem 7 today.  CBc, fasting lipid , tSH in Dec 6 or after  Increase dose of metformin to 3 times daily  It is important that you exercise regularly at least  60 minutes 7 times a week. If you develop chest pain, have severe difficulty breathing, or feel very tired, stop exercising immediately and seek medical attention   A healthy diet is rich in fruit, vegetables and whole grains. Poultry fish, nuts and beans are a healthy choice for protein rather then red meat. A low sodium diet and drinking 64 ounces of water daily is generally recommended. Oils and sweet should be limited. Carbohydrates especially for those who are diabetic or overweight, should be limited to 30-45 gram per meal. It is important to eat on a regular schedule, at least 3 times daily. Snacks should be primarily fruits, vegetables or nuts.  Weight loss goal is 3 pounds per month   Use wax softener in both  ears for 3 to 5 days before next visit

## 2011-12-02 NOTE — Assessment & Plan Note (Signed)
Improved. Pt applauded on succesful weight loss through lifestyle change, and encouraged to continue same. Weight loss goal set for the next several months.  

## 2011-12-02 NOTE — Assessment & Plan Note (Signed)
Patient educated about the importance of limiting  Carbohydrate intake , the need to commit to daily physical activity for a minimum of 30 minutes , and to commit weight loss. The fact that changes in all these areas will reduce or eliminate all together the development of diabetes is stressed.   Improved, will increase metformin dose, helping with weight loss as she changes her diet

## 2011-12-02 NOTE — Assessment & Plan Note (Signed)
Controlled, no change in medication DASH diet and commitment to daily physical activity for a minimum of 30 minutes discussed and encouraged, as a part of hypertension management. The importance of attaining a healthy weight is also discussed.  

## 2011-12-02 NOTE — Assessment & Plan Note (Signed)
Receives injections at the specialty clinic regularly

## 2011-12-02 NOTE — Progress Notes (Signed)
  Subjective:    Patient ID: Sheri Jones, female    DOB: [birth year], 66 y.o.   MRN: [id number]  HPI The PT is here for follow up and re-evaluation of chronic medical conditions, medication management and review of any available recent lab and radiology data.  Preventive health is updated, specifically  Cancer screening and Immunization.   Questions or concerns regarding consultations or procedures which the PT has had in the interim are  addressed. The PT denies any adverse reactions to current medications since the last visit.  There are no new concerns.  There are no specific complaints       Review of Systems See HPI Denies recent fever or chills. Denies sinus pressure, nasal congestion, ear pain or sore throat. Denies chest congestion, productive cough or wheezing. Denies chest pains, palpitations and leg swelling Denies abdominal pain, nausea, vomiting,diarrhea or constipation.   Denies dysuria, frequency, hesitancy or incontinence. Chronic  joint pain, swelling and limitation in mobility.Sees orthopedics Denies headaches, seizures, numbness, or tingling. Denies depression, anxiety or insomnia. Denies skin break down or rash.        Objective:   Physical Exam  Patient alert and oriented and in no cardiopulmonary distress.  HEENT: No facial asymmetry, EOMI, no sinus tenderness,  oropharynx pink and moist.  Neck supple no adenopathy.  Chest: Clear to auscultation bilaterally.  CVS: S1, S2 no murmurs, no S3.  ABD: Soft non tender. Bowel sounds normal.  Ext: No edema  MS: Adequate ROM spine, shoulders, hips and knees.  Skin: Intact, no ulcerations or rash noted.  Psych: Good eye contact, normal affect. Memory intact not anxious or depressed appearing.  CNS: CN 2-12 intact, power, tone and sensation normal throughout.       Assessment & Plan:

## 2011-12-04 ENCOUNTER — Encounter (HOSPITAL_COMMUNITY): Payer: Federal, State, Local not specified - PPO | Attending: Oncology

## 2011-12-04 DIAGNOSIS — E538 Deficiency of other specified B group vitamins: Secondary | ICD-10-CM | POA: Insufficient documentation

## 2011-12-04 NOTE — Progress Notes (Signed)
Labs done at Dr Luther Parody office on 8/7

## 2011-12-14 ENCOUNTER — Ambulatory Visit (HOSPITAL_COMMUNITY): Payer: Federal, State, Local not specified - PPO

## 2011-12-19 ENCOUNTER — Encounter (HOSPITAL_BASED_OUTPATIENT_CLINIC_OR_DEPARTMENT_OTHER): Payer: Federal, State, Local not specified - PPO

## 2011-12-19 VITALS — BP 133/83 | HR 71

## 2011-12-19 DIAGNOSIS — E538 Deficiency of other specified B group vitamins: Secondary | ICD-10-CM

## 2011-12-19 MED ORDER — CYANOCOBALAMIN 1000 MCG/ML IJ SOLN
1000.0000 ug | Freq: Once | INTRAMUSCULAR | Status: AC
  Administered 2011-12-19: 1000 ug via INTRAMUSCULAR

## 2011-12-19 MED ORDER — CYANOCOBALAMIN 1000 MCG/ML IJ SOLN
INTRAMUSCULAR | Status: AC
  Filled 2011-12-19: qty 1

## 2011-12-19 NOTE — Progress Notes (Signed)
Tolerated Vitamin B12 injection well  

## 2012-01-11 ENCOUNTER — Ambulatory Visit (INDEPENDENT_AMBULATORY_CARE_PROVIDER_SITE_OTHER): Payer: Federal, State, Local not specified - PPO

## 2012-01-11 DIAGNOSIS — Z23 Encounter for immunization: Secondary | ICD-10-CM

## 2012-01-14 ENCOUNTER — Ambulatory Visit: Payer: Federal, State, Local not specified - PPO

## 2012-01-15 ENCOUNTER — Ambulatory Visit (HOSPITAL_COMMUNITY): Payer: Federal, State, Local not specified - PPO

## 2012-01-15 ENCOUNTER — Encounter (HOSPITAL_COMMUNITY): Payer: Federal, State, Local not specified - PPO | Attending: Oncology

## 2012-01-15 VITALS — BP 117/80 | HR 67

## 2012-01-15 DIAGNOSIS — E538 Deficiency of other specified B group vitamins: Secondary | ICD-10-CM

## 2012-01-15 MED ORDER — CYANOCOBALAMIN 1000 MCG/ML IJ SOLN: 1000.0000 ug | Freq: Once | INTRAMUSCULAR | Status: DC

## 2012-01-15 MED ORDER — CYANOCOBALAMIN 1000 MCG/ML IJ SOLN
INTRAMUSCULAR | Status: AC
  Filled 2012-01-15: qty 1

## 2012-01-15 NOTE — Progress Notes (Signed)
Sheri Jones presents today for injection per MD orders. B12 1000mcg administered SQ in right Lower Back. Administration without incident. Patient tolerated well.  

## 2012-02-12 ENCOUNTER — Ambulatory Visit (HOSPITAL_COMMUNITY): Payer: Federal, State, Local not specified - PPO

## 2012-02-13 ENCOUNTER — Other Ambulatory Visit: Payer: Self-pay | Admitting: Orthopedic Surgery

## 2012-02-13 ENCOUNTER — Other Ambulatory Visit: Payer: Self-pay | Admitting: Family Medicine

## 2012-02-15 ENCOUNTER — Encounter (HOSPITAL_COMMUNITY): Payer: Federal, State, Local not specified - PPO | Attending: Oncology

## 2012-02-15 ENCOUNTER — Other Ambulatory Visit (HOSPITAL_COMMUNITY): Payer: Self-pay | Admitting: Oncology

## 2012-02-15 VITALS — BP 118/74 | HR 64 | Resp 18

## 2012-02-15 DIAGNOSIS — E876 Hypokalemia: Secondary | ICD-10-CM

## 2012-02-15 DIAGNOSIS — E538 Deficiency of other specified B group vitamins: Secondary | ICD-10-CM

## 2012-02-15 MED ORDER — CYANOCOBALAMIN 1000 MCG/ML IJ SOLN
1000.0000 ug | Freq: Once | INTRAMUSCULAR | Status: AC
  Administered 2012-02-15: 1000 ug via INTRAMUSCULAR

## 2012-02-15 MED ORDER — POTASSIUM CHLORIDE ER 10 MEQ PO TBCR: 10.0000 meq | EXTENDED_RELEASE_TABLET | Freq: Two times a day (BID) | ORAL | Status: DC

## 2012-02-15 MED ORDER — CYANOCOBALAMIN 1000 MCG/ML IJ SOLN
INTRAMUSCULAR | Status: AC
  Filled 2012-02-15: qty 1

## 2012-02-15 NOTE — Progress Notes (Signed)
Sheri Jones presents today for injection per MD orders. B12 1000 mcg administered IM in right Gluteal. Administration without incident. Patient tolerated well.  

## 2012-02-19 ENCOUNTER — Telehealth: Payer: Self-pay | Admitting: Family Medicine

## 2012-02-20 ENCOUNTER — Other Ambulatory Visit: Payer: Self-pay | Admitting: Family Medicine

## 2012-02-29 ENCOUNTER — Other Ambulatory Visit: Payer: Self-pay

## 2012-02-29 MED ORDER — METFORMIN HCL 500 MG PO TABS: 500.0000 mg | ORAL_TABLET | Freq: Every day | ORAL | Status: DC

## 2012-02-29 NOTE — Telephone Encounter (Signed)
Called and left message for patient to return call.  

## 2012-02-29 NOTE — Telephone Encounter (Signed)
Spoke with patient she was concerned that she never received metformin.  Med sent back in to pharmacy for patient to pickup.

## 2012-03-12 ENCOUNTER — Encounter (HOSPITAL_COMMUNITY): Payer: Federal, State, Local not specified - PPO | Attending: Oncology

## 2012-03-12 VITALS — BP 120/79 | HR 76

## 2012-03-12 DIAGNOSIS — E538 Deficiency of other specified B group vitamins: Secondary | ICD-10-CM | POA: Insufficient documentation

## 2012-03-12 MED ORDER — CYANOCOBALAMIN 1000 MCG/ML IJ SOLN
1000.0000 ug | Freq: Once | INTRAMUSCULAR | Status: AC
  Administered 2012-03-12: 1000 ug via INTRAMUSCULAR

## 2012-03-12 MED ORDER — CYANOCOBALAMIN 1000 MCG/ML IJ SOLN
INTRAMUSCULAR | Status: AC
  Filled 2012-03-12: qty 1

## 2012-03-12 NOTE — Progress Notes (Signed)
Tolerated Vitamin B12 injection well  

## 2012-03-28 ENCOUNTER — Other Ambulatory Visit: Payer: Self-pay | Admitting: Family Medicine

## 2012-03-29 LAB — CBC WITH DIFFERENTIAL/PLATELET
Basophils Absolute: 0 10*3/uL (ref 0.0–0.1)
Basophils Relative: 0 % (ref 0–1)
Eosinophils Absolute: 0.2 10*3/uL (ref 0.0–0.7)
MCH: 31 pg (ref 26.0–34.0)
MCHC: 33.1 g/dL (ref 30.0–36.0)
Neutro Abs: 1.6 10*3/uL — ABNORMAL LOW (ref 1.7–7.7)
Neutrophils Relative %: 41 % — ABNORMAL LOW (ref 43–77)
Platelets: 173 10*3/uL (ref 150–400)
RDW: 14.7 % (ref 11.5–15.5)

## 2012-03-29 LAB — LIPID PANEL
Cholesterol: 164 mg/dL (ref 0–200)
Triglycerides: 59 mg/dL (ref <150)
VLDL: 12 mg/dL (ref 0–40)

## 2012-04-03 ENCOUNTER — Other Ambulatory Visit (HOSPITAL_COMMUNITY)
Admission: RE | Admit: 2012-04-03 | Discharge: 2012-04-03 | Disposition: A | Discharge status: Home / Self Care | Payer: Federal, State, Local not specified - PPO | Source: Ambulatory Visit | Attending: Family Medicine | Admitting: Family Medicine

## 2012-04-03 ENCOUNTER — Encounter: Payer: Self-pay | Admitting: Family Medicine

## 2012-04-03 ENCOUNTER — Ambulatory Visit (INDEPENDENT_AMBULATORY_CARE_PROVIDER_SITE_OTHER): Payer: Federal, State, Local not specified - PPO | Admitting: Family Medicine

## 2012-04-03 VITALS — BP 126/74 | HR 72 | Resp 18 | Ht 63.0 in | Wt 203.1 lb

## 2012-04-03 DIAGNOSIS — H6120 Impacted cerumen, unspecified ear: Secondary | ICD-10-CM

## 2012-04-03 DIAGNOSIS — R7309 Other abnormal glucose: Secondary | ICD-10-CM

## 2012-04-03 DIAGNOSIS — Z1211 Encounter for screening for malignant neoplasm of colon: Secondary | ICD-10-CM

## 2012-04-03 DIAGNOSIS — Z1151 Encounter for screening for human papillomavirus (HPV): Secondary | ICD-10-CM | POA: Insufficient documentation

## 2012-04-03 DIAGNOSIS — Z0000 Encounter for general adult medical examination without abnormal findings: Secondary | ICD-10-CM

## 2012-04-03 DIAGNOSIS — Z01419 Encounter for gynecological examination (general) (routine) without abnormal findings: Secondary | ICD-10-CM | POA: Insufficient documentation

## 2012-04-03 DIAGNOSIS — R7303 Prediabetes: Secondary | ICD-10-CM

## 2012-04-03 LAB — POC HEMOCCULT BLD/STL (OFFICE/1-CARD/DIAGNOSTIC): Fecal Occult Blood, POC: NEGATIVE

## 2012-04-03 NOTE — Patient Instructions (Addendum)
F/u in 3 month, please call if you need me before  HBa1C in 3 month, non fasting before visit  You will be given a 1500 cal diet sheet to follow  Ear irrigation today.  Your cholesterol is excellent.  Commit to exercise for 1 hour 5 days per week, also stop sweets and limit your carbohydrates, eg pasta, potato, bread and rice , so that you lose weight and your blood sugar improves

## 2012-04-03 NOTE — Progress Notes (Signed)
  Subjective:    Patient ID: Sheri Jones, female    DOB: [birth year], 66 y.o.   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 Continues to work on lifestyle change for weight loss, intends to join gym in the new year   Review of Systems See HPI Denies recent fever or chills. Denies sinus pressure, nasal congestion, ear pain or sore throat. Denies chest congestion, productive cough or wheezing. Denies chest pains, palpitations and leg swelling Denies abdominal pain, nausea, vomiting,diarrhea or constipation.   Denies dysuria, frequency, hesitancy or incontinence. Chronic  joint pain,  And mild  limitation in mobility. Denies headaches, seizures, numbness, or tingling. Denies depression, anxiety or insomnia. Denies skin break down or rash.        Objective:   Physical Exam  Pleasant well nourished female, alert and oriented x 3, in no cardio-pulmonary distress. Afebrile. HEENT No facial trauma or asymetry. Sinuses non tender.  EOMI, PERTL, fundoscopic exam is normal, no hemorhage or exudate.  External ears normal, tympanic membranes occluded by wax bilaterally, left greater than right  Oropharynx moist, no exudate, fair dentition. Neck: supple, no adenopathy,JVD or thyromegaly.No bruits.  Chest: Clear to ascultation bilaterally.No crackles or wheezes. Non tender to palpation  Breast: No asymetry,no masses. No nipple discharge or inversion.No skin lesions No axillary or supraclavicular adenopathy  Cardiovascular system; Heart sounds normal,  S1 and  S2 ,no S3.  No murmur, or thrill. Apical beat not displaced Peripheral pulses normal.  Abdomen: Soft, non tender, no organomegaly or masses. No bruits. Bowel sounds normal. No guarding, tenderness or rebound.  Rectal:  No mass. Guaiac negative stool.  GU: External genitalia normal. No lesions.Female distribution  of hair Vaginal canal normal.No discharge. Uterus normal size, no adnexal masses, no cervical motion or adnexal tenderness.  Musculoskeletal exam: Adequate though mildly reduced  ROM of spine, hips ,  and knees. No deformity ,swelling or crepitus noted. No muscle wasting or atrophy.   Neurologic: Cranial nerves 2 to 12 intact. Power, tone ,sensation and reflexes normal throughout. No disturbance in gait. No tremor.  Skin: Intact, no ulceration, erythema , scaling or rash noted. Pigmentation normal throughout  Psych; Normal mood and affect. Judgement and concentration normal       Assessment & Plan:

## 2012-04-05 DIAGNOSIS — H6120 Impacted cerumen, unspecified ear: Secondary | ICD-10-CM | POA: Insufficient documentation

## 2012-04-05 DIAGNOSIS — Z0000 Encounter for general adult medical examination without abnormal findings: Secondary | ICD-10-CM | POA: Insufficient documentation

## 2012-04-05 NOTE — Assessment & Plan Note (Signed)
Pelvic and breast exam performed and documented. Pap sent. Rectal performed, no mass, heme negative stool Counseled re importance of regular physical activity, dietary change and weigh loss to improve health

## 2012-04-05 NOTE — Assessment & Plan Note (Signed)
Bilateral cerumen impaction, successful irrigation, no trauma to canal, an improved hearing

## 2012-04-09 ENCOUNTER — Encounter (HOSPITAL_COMMUNITY): Payer: Federal, State, Local not specified - PPO | Attending: Oncology

## 2012-04-09 DIAGNOSIS — E538 Deficiency of other specified B group vitamins: Secondary | ICD-10-CM

## 2012-04-09 MED ORDER — CYANOCOBALAMIN 1000 MCG/ML IJ SOLN
1000.0000 ug | Freq: Once | INTRAMUSCULAR | Status: AC
  Administered 2012-04-09: 1000 ug via INTRAMUSCULAR

## 2012-04-09 MED ORDER — CYANOCOBALAMIN 1000 MCG/ML IJ SOLN
INTRAMUSCULAR | Status: AC
  Filled 2012-04-09: qty 1

## 2012-04-09 NOTE — Progress Notes (Signed)
Sheri Jones presents today for injection per MD orders. B12 1000 mcg administered im in left Gluteal. Administration without incident. Patient tolerated well.

## 2012-05-09 ENCOUNTER — Encounter (HOSPITAL_COMMUNITY): Payer: Federal, State, Local not specified - PPO | Attending: Oncology

## 2012-05-09 VITALS — BP 103/71 | HR 73

## 2012-05-09 DIAGNOSIS — E538 Deficiency of other specified B group vitamins: Secondary | ICD-10-CM

## 2012-05-09 MED ORDER — CYANOCOBALAMIN 1000 MCG/ML IJ SOLN
1000.0000 ug | Freq: Once | INTRAMUSCULAR | Status: AC
  Administered 2012-05-09: 1000 ug via INTRAMUSCULAR

## 2012-05-09 MED ORDER — CYANOCOBALAMIN 1000 MCG/ML IJ SOLN
INTRAMUSCULAR | Status: AC
  Filled 2012-05-09: qty 1

## 2012-05-09 NOTE — Progress Notes (Signed)
Sheri Jones presents today for injection per MD orders. B12 1000 mcg administered SQ in right Gluteal. Administration without incident. Patient tolerated well.  

## 2012-06-05 ENCOUNTER — Other Ambulatory Visit (HOSPITAL_COMMUNITY): Payer: Federal, State, Local not specified - PPO

## 2012-06-09 ENCOUNTER — Encounter (HOSPITAL_COMMUNITY): Payer: Self-pay | Admitting: Oncology

## 2012-06-09 ENCOUNTER — Encounter (HOSPITAL_COMMUNITY): Payer: Federal, State, Local not specified - PPO | Attending: Oncology | Admitting: Oncology

## 2012-06-09 ENCOUNTER — Encounter (HOSPITAL_BASED_OUTPATIENT_CLINIC_OR_DEPARTMENT_OTHER): Payer: Federal, State, Local not specified - PPO

## 2012-06-09 ENCOUNTER — Telehealth (HOSPITAL_COMMUNITY): Payer: Self-pay | Admitting: *Deleted

## 2012-06-09 ENCOUNTER — Encounter (HOSPITAL_COMMUNITY): Payer: Federal, State, Local not specified - PPO

## 2012-06-09 VITALS — BP 118/77 | HR 71 | Temp 97.1°F | Resp 18 | Wt 200.0 lb

## 2012-06-09 DIAGNOSIS — Z9884 Bariatric surgery status: Secondary | ICD-10-CM

## 2012-06-09 DIAGNOSIS — E538 Deficiency of other specified B group vitamins: Secondary | ICD-10-CM

## 2012-06-09 DIAGNOSIS — I10 Essential (primary) hypertension: Secondary | ICD-10-CM

## 2012-06-09 LAB — CBC
HCT: 37 % (ref 36.0–46.0)
Hemoglobin: 13 g/dL (ref 12.0–15.0)
MCH: 31.9 pg (ref 26.0–34.0)
MCHC: 35.1 g/dL (ref 30.0–36.0)

## 2012-06-09 MED ORDER — CYANOCOBALAMIN 1000 MCG/ML IJ SOLN
INTRAMUSCULAR | Status: AC
  Filled 2012-06-09: qty 1

## 2012-06-09 MED ORDER — CYANOCOBALAMIN 1000 MCG/ML IJ SOLN
1000.0000 ug | Freq: Once | INTRAMUSCULAR | Status: AC
  Administered 2012-06-09: 1000 ug via INTRAMUSCULAR

## 2012-06-09 NOTE — Progress Notes (Signed)
Sheri Jones presents today for injection per MD orders. B12 1000 mcg administered SQ in right Gluteal. Administration without incident. Patient tolerated well.  

## 2012-06-09 NOTE — Telephone Encounter (Signed)
Sheri Jones has appt with Dr. Suszanne Conners for hearing test on 06/19/12 at 1:30 PM and I left message on the patients home voice mail about the appt.

## 2012-06-09 NOTE — Progress Notes (Signed)
Labs drawn today for cbc 

## 2012-06-09 NOTE — Progress Notes (Signed)
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 #2 gastric bypass surgery January 2004 #3 obesity but she has lost 11 pounds in one year. #4 right knee arthritis with mild worsening and she will have followup with Dr. Romeo Apple soon #5 history of hypertension #6 difficulty hearing and she does have wax in both ears but I suspect she needs an audiology consultation. We will see if we can help her with that.   Decided to years having a significant amount of wax blocking the openings partially she has done well. She is walking almost every day. She does need her right knee looked at some point in the near future again. She states your has an appointment.  Her blood counts are excellent we will see her back in one year with ongoing monthly B12 shots. She states she feels tremendously better.

## 2012-06-09 NOTE — Patient Instructions (Signed)
.  [facility] Discharge Instructions  RECOMMENDATIONS MADE BY THE CONSULTANT AND ANY TEST RESULTS WILL BE SENT TO YOUR REFERRING PHYSICIAN.  EXAM FINDINGS BY THE PHYSICIAN TODAY AND SIGNS OR SYMPTOMS TO REPORT TO CLINIC OR PRIMARY PHYSICIAN: We will see if Dr. Suszanne Conners can have hearing test done for you  MEDICATIONS PRESCRIBED:  Continue b12 shots monthly SPECIAL INSTRUCTIONS/FOLLOW-UP: Labs and Dr visit in 1 year  Thank you for choosing [facility] to provide your oncology and hematology care.  To afford each patient quality time with our providers, please arrive at least 15 minutes before your scheduled appointment time.  With your help, our goal is to use those 15 minutes to complete the necessary work-up to ensure our physicians have the information they need to help with your evaluation and healthcare recommendations.    Effective January 1st, 2014, we ask that you re-schedule your appointment with our physicians should you arrive 10 or more minutes late for your appointment.  We strive to give you quality time with our providers, and arriving late affects you and other patients whose appointments are after yours.    Again, thank you for choosing [facility].  Our hope is that these requests will decrease the amount of time that you wait before being seen by our physicians.       _____________________________________________________________  Should you have questions after your visit to [facility], please contact our office at [phone] between the hours of 8:30 a.m. and 5:00 p.m.  Voicemails left after 4:30 p.m. will not be returned until the following business day.  For prescription refill requests, have your pharmacy contact our office with your prescription refill request.

## 2012-06-19 ENCOUNTER — Ambulatory Visit (INDEPENDENT_AMBULATORY_CARE_PROVIDER_SITE_OTHER): Payer: Federal, State, Local not specified - PPO | Admitting: Otolaryngology

## 2012-07-02 ENCOUNTER — Ambulatory Visit: Payer: Federal, State, Local not specified - PPO | Admitting: Family Medicine

## 2012-07-04 LAB — HEMOGLOBIN A1C
Hgb A1c MFr Bld: 5.7 % — ABNORMAL HIGH (ref <5.7)
Mean Plasma Glucose: 117 mg/dL — ABNORMAL HIGH (ref <117)

## 2012-07-07 ENCOUNTER — Encounter (HOSPITAL_COMMUNITY): Payer: Federal, State, Local not specified - PPO | Attending: Oncology

## 2012-07-07 VITALS — BP 128/90 | HR 76 | Resp 18

## 2012-07-07 DIAGNOSIS — E538 Deficiency of other specified B group vitamins: Secondary | ICD-10-CM | POA: Insufficient documentation

## 2012-07-07 MED ORDER — CYANOCOBALAMIN 1000 MCG/ML IJ SOLN
INTRAMUSCULAR | Status: AC
  Filled 2012-07-07: qty 1

## 2012-07-07 MED ORDER — CYANOCOBALAMIN 1000 MCG/ML IJ SOLN
1000.0000 ug | Freq: Once | INTRAMUSCULAR | Status: AC
  Administered 2012-07-07: 1000 ug via INTRAMUSCULAR

## 2012-07-07 NOTE — Progress Notes (Signed)
Sheri Jones presents today for injection per MD orders. B12 1000 mcg administered IM in right Gluteal. Administration without incident. Patient tolerated well.  

## 2012-07-09 ENCOUNTER — Encounter: Payer: Self-pay | Admitting: Family Medicine

## 2012-07-09 ENCOUNTER — Ambulatory Visit (INDEPENDENT_AMBULATORY_CARE_PROVIDER_SITE_OTHER): Payer: Federal, State, Local not specified - PPO | Admitting: Family Medicine

## 2012-07-09 VITALS — BP 122/80 | HR 80 | Resp 18 | Ht 63.0 in | Wt 202.1 lb

## 2012-07-09 DIAGNOSIS — R7309 Other abnormal glucose: Secondary | ICD-10-CM

## 2012-07-09 DIAGNOSIS — E669 Obesity, unspecified: Secondary | ICD-10-CM

## 2012-07-09 MED ORDER — HYDROCHLOROTHIAZIDE 25 MG PO TABS: ORAL_TABLET | ORAL | Status: DC

## 2012-07-09 MED ORDER — METFORMIN HCL 500 MG PO TABS: 500.0000 mg | ORAL_TABLET | Freq: Every day | ORAL | Status: DC

## 2012-07-09 NOTE — Patient Instructions (Addendum)
F/U 4.5 month  It is important that you exercise regularly at least 30 minutes 5 times a week. If you develop chest pain, have severe difficulty breathing, or feel very tired, stop exercising immediately and seek medical attention   A healthy diet is rich in fruit, vegetables and whole grains. Poultry fish, nuts and beans are a healthy choice for protein rather then red meat. A low sodium diet and drinking 64 ounces of water daily is generally recommended. Oils and sweet should be limited. Carbohydrates especially for those who are diabetic or overweight, should be limited to 30-45 gram per meal. It is important to eat on a regular schedule, at least 3 times daily. Snacks should be primarily fruits, vegetables or nuts.   HBA1C and chem 7 in 4.5 month   Blood pressure is excellent   Weight loss goal of 8 pounds

## 2012-07-13 NOTE — Assessment & Plan Note (Signed)
Unchanged, start metformin Patient educated about the importance of limiting  Carbohydrate intake , the need to commit to daily physical activity for a minimum of 30 minutes , and to commit weight loss. The fact that changes in all these areas will reduce or eliminate all together the development of diabetes is stressed.

## 2012-07-13 NOTE — Assessment & Plan Note (Signed)
Controlled, no change in medication DASH diet and commitment to daily physical activity for a minimum of 30 minutes discussed and encouraged, as a part of hypertension management. The importance of attaining a healthy weight is also discussed.  

## 2012-07-13 NOTE — Assessment & Plan Note (Signed)
Deteriorated. Patient re-educated about  the importance of commitment to a  minimum of 150 minutes of exercise per week. The importance of healthy food choices with portion control discussed. Encouraged to start a food diary, count calories and to consider  joining a support group. Sample diet sheets offered. Goals set by the patient for the next several months.    

## 2012-07-13 NOTE — Progress Notes (Signed)
  Subjective:    Patient ID: Sheri Jones, female    DOB: 25-Mar-1946, 67 y.o.   MRN: [id number]  HPI The PT is here for follow up and re-evaluation of chronic medical conditions, medication management and review of any available recent lab and radiology data.  Preventive health is updated, specifically  Cancer screening and Immunization.   Questions or concerns regarding consultations or procedures which the PT has had in the interim are  addressed. The PT denies any adverse reactions to current medications since the last visit.  There are no new concerns.  There are no specific complaints       Review of Systems See HPI Denies recent fever or chills. Denies sinus pressure, nasal congestion, ear pain or sore throat. Denies chest congestion, productive cough or wheezing. Denies chest pains, palpitations and leg swelling Denies abdominal pain, nausea, vomiting,diarrhea or constipation.   Denies dysuria, frequency, hesitancy or incontinence. Denies joint pain, swelling and limitation in mobility. Denies headaches, seizures, numbness, or tingling. Denies depression, anxiety or insomnia. Denies skin break down or rash.        Objective:   Physical Exam  Patient alert and oriented and in no cardiopulmonary distress.  HEENT: No facial asymmetry, EOMI, no sinus tenderness,  oropharynx pink and moist.  Neck supple no adenopathy.  Chest: Clear to auscultation bilaterally.  CVS: S1, S2 no murmurs, no S3.  ABD: Soft non tender. Bowel sounds normal.  Ext: No edema  MS: Adequate ROM spine, shoulders, hips and knees.  Skin: Intact, no ulcerations or rash noted.  Psych: Good eye contact, normal affect. Memory intact not anxious or depressed appearing.  CNS: CN 2-12 intact, power, tone and sensation normal throughout.       Assessment & Plan:

## 2012-08-02 ENCOUNTER — Other Ambulatory Visit: Payer: Self-pay | Admitting: Family Medicine

## 2012-08-06 ENCOUNTER — Other Ambulatory Visit: Payer: Self-pay | Admitting: Family Medicine

## 2012-08-07 ENCOUNTER — Ambulatory Visit (HOSPITAL_COMMUNITY): Payer: Federal, State, Local not specified - PPO

## 2012-08-13 ENCOUNTER — Encounter (HOSPITAL_COMMUNITY): Payer: Federal, State, Local not specified - PPO | Attending: Oncology

## 2012-08-13 VITALS — BP 117/73 | HR 75

## 2012-08-13 DIAGNOSIS — E538 Deficiency of other specified B group vitamins: Secondary | ICD-10-CM | POA: Insufficient documentation

## 2012-08-13 MED ORDER — CYANOCOBALAMIN 1000 MCG/ML IJ SOLN
INTRAMUSCULAR | Status: AC
  Filled 2012-08-13: qty 1

## 2012-08-13 MED ORDER — CYANOCOBALAMIN 1000 MCG/ML IJ SOLN
1000.0000 ug | Freq: Once | INTRAMUSCULAR | Status: AC
  Administered 2012-08-13: 1000 ug via INTRAMUSCULAR

## 2012-08-13 NOTE — Progress Notes (Signed)
Sheri Jones presents today for injection per MD orders. B12 1000 mcg administered IM in right Gluteal. Administration without incident. Patient tolerated well.  

## 2012-08-27 ENCOUNTER — Encounter: Payer: Self-pay | Admitting: *Deleted

## 2012-08-27 DIAGNOSIS — C50212 Malignant neoplasm of upper-inner quadrant of left female breast: Secondary | ICD-10-CM

## 2012-09-04 ENCOUNTER — Ambulatory Visit (HOSPITAL_COMMUNITY): Payer: Federal, State, Local not specified - PPO

## 2012-09-10 ENCOUNTER — Encounter (HOSPITAL_COMMUNITY): Payer: Federal, State, Local not specified - PPO | Attending: Oncology

## 2012-09-10 ENCOUNTER — Other Ambulatory Visit (HOSPITAL_COMMUNITY): Payer: Self-pay | Admitting: Oncology

## 2012-09-10 VITALS — BP 120/72 | HR 69 | Resp 16

## 2012-09-10 DIAGNOSIS — E538 Deficiency of other specified B group vitamins: Secondary | ICD-10-CM

## 2012-09-10 MED ORDER — CYANOCOBALAMIN 1000 MCG/ML IJ SOLN
1000.0000 ug | Freq: Once | INTRAMUSCULAR | Status: AC
  Administered 2012-09-10: 1000 ug via INTRAMUSCULAR

## 2012-09-10 MED ORDER — CYANOCOBALAMIN 1000 MCG/ML IJ SOLN
INTRAMUSCULAR | Status: AC
  Filled 2012-09-10: qty 1

## 2012-09-10 NOTE — Progress Notes (Signed)
Sheri Jones presents today for injection per the provider's orders.  Vit B12 administered administration without incident; see MAR for injection details.  Patient tolerated procedure well and without incident.  No questions or complaints noted at this time.

## 2012-10-08 ENCOUNTER — Other Ambulatory Visit: Payer: Self-pay | Admitting: Family Medicine

## 2012-10-08 DIAGNOSIS — Z139 Encounter for screening, unspecified: Secondary | ICD-10-CM

## 2012-10-09 ENCOUNTER — Encounter (HOSPITAL_COMMUNITY): Payer: Federal, State, Local not specified - PPO | Attending: Oncology

## 2012-10-09 ENCOUNTER — Ambulatory Visit (INDEPENDENT_AMBULATORY_CARE_PROVIDER_SITE_OTHER): Payer: Federal, State, Local not specified - PPO

## 2012-10-09 ENCOUNTER — Ambulatory Visit (INDEPENDENT_AMBULATORY_CARE_PROVIDER_SITE_OTHER): Payer: Federal, State, Local not specified - PPO | Admitting: Orthopedic Surgery

## 2012-10-09 VITALS — BP 122/84 | Ht 63.0 in | Wt 203.0 lb

## 2012-10-09 VITALS — BP 122/71 | HR 74

## 2012-10-09 DIAGNOSIS — M1710 Unilateral primary osteoarthritis, unspecified knee: Secondary | ICD-10-CM

## 2012-10-09 DIAGNOSIS — M1711 Unilateral primary osteoarthritis, right knee: Secondary | ICD-10-CM

## 2012-10-09 DIAGNOSIS — E538 Deficiency of other specified B group vitamins: Secondary | ICD-10-CM | POA: Insufficient documentation

## 2012-10-09 MED ORDER — CYANOCOBALAMIN 1000 MCG/ML IJ SOLN
INTRAMUSCULAR | Status: AC
  Filled 2012-10-09: qty 1

## 2012-10-09 MED ORDER — CYANOCOBALAMIN 1000 MCG/ML IJ SOLN
1000.0000 ug | Freq: Once | INTRAMUSCULAR | Status: AC
  Administered 2012-10-09: 1000 ug via INTRAMUSCULAR

## 2012-10-09 NOTE — Progress Notes (Signed)
Sheri Jones presents today for injection per MD orders. B12 1000 mcg administered SQ in right Gluteal. Administration without incident. Patient tolerated well.  

## 2012-10-09 NOTE — Progress Notes (Signed)
Patient ID: Sheri Jones, female   DOB: Sep 07, 1945, 66 y.o.   MRN: [id number] Chief complaint routine followup osteoarthritis right knee  Current meds diclofenac doing well with pain levels two out of three. She is ambulating for exercise a mile per day.  No catching locking or giving way just some increased pain when the weather changes  BP 122/84  Ht 5\' 3"  (1.6 m)  Wt 203 lb (92.08 kg)  BMI 35.97 kg/m2 General appearance is normal, the patient is alert and oriented x3 with normal mood and affect. Ambulation is without supportive devices she does have a valgus knee with a thrust. She has excellent range of motion however with crepitance and mild lateral joint line tenderness. He remained stable. Neurovascular exam is intact  X-rays show valgus knee with decreased joint space, sclerosis and mild osteophyte  Impression controlled osteoarthritis right knee continue diclofenac 50 mg twice a day return in one year for x-ray of the right knee

## 2012-10-09 NOTE — Patient Instructions (Addendum)
Continue diclofenac twice a day  X-rays in one year  Continue  walking

## 2012-11-06 ENCOUNTER — Encounter (HOSPITAL_COMMUNITY): Payer: Federal, State, Local not specified - PPO | Attending: Oncology

## 2012-11-06 VITALS — BP 109/73 | HR 73 | Resp 20

## 2012-11-06 DIAGNOSIS — E538 Deficiency of other specified B group vitamins: Secondary | ICD-10-CM

## 2012-11-06 MED ORDER — CYANOCOBALAMIN 1000 MCG/ML IJ SOLN
INTRAMUSCULAR | Status: AC
  Filled 2012-11-06: qty 1

## 2012-11-06 MED ORDER — CYANOCOBALAMIN 1000 MCG/ML IJ SOLN
1000.0000 ug | Freq: Once | INTRAMUSCULAR | Status: AC
  Administered 2012-11-06: 1000 ug via INTRAMUSCULAR

## 2012-11-06 NOTE — Progress Notes (Signed)
Sheri Jones presents today for injection per MD orders. B12 1000mcg administered IM in left Upper Arm. Administration without incident. Patient tolerated well.  

## 2012-11-12 ENCOUNTER — Encounter: Payer: Self-pay | Admitting: Family Medicine

## 2012-11-12 ENCOUNTER — Ambulatory Visit (INDEPENDENT_AMBULATORY_CARE_PROVIDER_SITE_OTHER): Payer: Federal, State, Local not specified - PPO | Admitting: Family Medicine

## 2012-11-12 VITALS — BP 130/80 | HR 73 | Resp 16 | Ht 63.0 in | Wt 205.8 lb

## 2012-11-12 DIAGNOSIS — K5289 Other specified noninfective gastroenteritis and colitis: Secondary | ICD-10-CM

## 2012-11-12 DIAGNOSIS — E669 Obesity, unspecified: Secondary | ICD-10-CM

## 2012-11-12 DIAGNOSIS — R7309 Other abnormal glucose: Secondary | ICD-10-CM

## 2012-11-12 DIAGNOSIS — R7303 Prediabetes: Secondary | ICD-10-CM

## 2012-11-12 DIAGNOSIS — I10 Essential (primary) hypertension: Secondary | ICD-10-CM

## 2012-11-12 DIAGNOSIS — E876 Hypokalemia: Secondary | ICD-10-CM

## 2012-11-12 DIAGNOSIS — K529 Noninfective gastroenteritis and colitis, unspecified: Secondary | ICD-10-CM

## 2012-11-12 MED ORDER — METFORMIN HCL 500 MG PO TABS: 500.0000 mg | ORAL_TABLET | Freq: Every day | ORAL | Status: DC

## 2012-11-12 MED ORDER — HYDROCHLOROTHIAZIDE 25 MG PO TABS: ORAL_TABLET | ORAL | Status: DC

## 2012-11-12 MED ORDER — DIPHENOXYLATE-ATROPINE 2.5-0.025 MG PO TABS: 1.0000 | ORAL_TABLET | Freq: Four times a day (QID) | ORAL | Status: DC | PRN

## 2012-11-12 MED ORDER — POTASSIUM CHLORIDE ER 10 MEQ PO TBCR: 10.0000 meq | EXTENDED_RELEASE_TABLET | Freq: Two times a day (BID) | ORAL | Status: DC

## 2012-11-12 NOTE — Progress Notes (Signed)
  Subjective:    Patient ID: Sheri Jones, female    DOB: 1945/08/20, 67 y.o.   MRN: [id number]  HPI Watery stool for the past 3 days, soft , more frequent, 2 today, no nausea, no fever , had a chill last Monday. No other family members have similar symptoms, feels a bit weak with her symptoms. Prior to this has been well.  Unfortunately not as comited to weight loss efforts as consistently as desired, so no improvement yet, but intends to continue to work on this , does not want phenetermin  Review of Systems See HPI Denies recent fever or chills. Denies sinus pressure, nasal congestion, ear pain or sore throat. Denies chest congestion, productive cough or wheezing. Denies chest pains, palpitations and leg swelling  Denies dysuria, frequency, hesitancy or incontinence. Denies joint pain, swelling and limitation in mobility. Denies headaches, seizures, numbness, or tingling. Denies depression, anxiety or insomnia. Denies skin break down or rash.        Objective:   Physical Exam Patient alert and oriented and in no cardiopulmonary distress.  HEENT: No facial asymmetry, EOMI, no sinus tenderness,  oropharynx pink and moist.  Neck supple no adenopathy.  Chest: Clear to auscultation bilaterally.  CVS: S1, S2 no murmurs, no S3.  ABD: Soft no localized tenderness or guarding, hyperactive bowel sounds, no organomegaly or masses Ext: No edema  MS: Adequate ROM spine, shoulders, hips and knees.  Skin: Intact, no ulcerations or rash noted.  Psych: Good eye contact, normal affect. Memory intact not anxious or depressed appearing.  CNS: CN 2-12 intact, power, tone and sensation normal throughout.        Assessment & Plan:

## 2012-11-12 NOTE — Patient Instructions (Addendum)
F/u in 4 month, cancel August appt  Lab today  You have gained 5 pounds  You have gastroenteritis, pls wash hands to avoid spread  Follow BRAT diet  B.R.A.T. Diet Your doctor has recommended the B.R.A.T. diet for you or your child until the condition improves. This is often used to help control diarrhea and vomiting symptoms. If you or your child can tolerate clear liquids, you may have:  Bananas.   Rice.   Applesauce.   Toast (and other simple starches such as crackers, potatoes, noodles).  Be sure to avoid dairy products, meats, and fatty foods until symptoms are better. Fruit juices such as apple, grape, and prune juice can make diarrhea worse. Avoid these. Continue this diet for 2 days or as instructed by your caregiver. Document Released: 04/09/2005 Document Revised: 03/29/2011 Document Reviewed: 09/26/2006 [facility] Patient Information 2012 [place], [place].

## 2012-11-13 ENCOUNTER — Ambulatory Visit: Payer: Federal, State, Local not specified - PPO | Admitting: Family Medicine

## 2012-11-13 LAB — BASIC METABOLIC PANEL
BUN: 18 mg/dL (ref 6–23)
Creat: 0.89 mg/dL (ref 0.50–1.10)
Potassium: 4 mEq/L (ref 3.5–5.3)

## 2012-11-13 LAB — HEMOGLOBIN A1C: Hgb A1c MFr Bld: 5.6 % (ref <5.7)

## 2012-11-16 NOTE — Assessment & Plan Note (Signed)
No clinical evidence of dehydration. Will check lab today Educated re hydration, Financial trader and BRAT diet

## 2012-11-16 NOTE — Assessment & Plan Note (Signed)
Controlled, no change in medication DASH diet and commitment to daily physical activity for a minimum of 30 minutes discussed and encouraged, as a part of hypertension management. The importance of attaining a healthy weight is also discussed.  

## 2012-11-16 NOTE — Assessment & Plan Note (Signed)
Normal HBA1C with metformin Patient educated about the importance of limiting  Carbohydrate intake , the need to commit to daily physical activity for a minimum of 30 minutes , and to commit weight loss. The fact that changes in all these areas will reduce or eliminate all together the development of diabetes is stressed.

## 2012-11-16 NOTE — Assessment & Plan Note (Signed)
Unchanged. Patient re-educated about  the importance of commitment to a  minimum of 150 minutes of exercise per week. The importance of healthy food choices with portion control discussed. Encouraged to start a food diary, count calories and to consider  joining a support group. Sample diet sheets offered. Goals set by the patient for the next several months.    

## 2012-11-18 ENCOUNTER — Ambulatory Visit (HOSPITAL_COMMUNITY)
Admission: RE | Admit: 2012-11-18 | Discharge: 2012-11-18 | Disposition: A | Discharge status: Home / Self Care | Payer: Federal, State, Local not specified - PPO | Source: Ambulatory Visit | Attending: Family Medicine | Admitting: Family Medicine

## 2012-11-18 DIAGNOSIS — Z139 Encounter for screening, unspecified: Secondary | ICD-10-CM

## 2012-11-18 DIAGNOSIS — Z1231 Encounter for screening mammogram for malignant neoplasm of breast: Secondary | ICD-10-CM | POA: Insufficient documentation

## 2012-11-25 ENCOUNTER — Ambulatory Visit: Payer: Federal, State, Local not specified - PPO | Admitting: Family Medicine

## 2012-12-10 ENCOUNTER — Encounter (HOSPITAL_COMMUNITY): Payer: Federal, State, Local not specified - PPO | Attending: Oncology

## 2012-12-10 VITALS — BP 104/73 | HR 73

## 2012-12-10 DIAGNOSIS — E538 Deficiency of other specified B group vitamins: Secondary | ICD-10-CM | POA: Insufficient documentation

## 2012-12-10 MED ORDER — CYANOCOBALAMIN 1000 MCG/ML IJ SOLN
INTRAMUSCULAR | Status: AC
  Filled 2012-12-10: qty 1

## 2012-12-10 MED ORDER — CYANOCOBALAMIN 1000 MCG/ML IJ SOLN
1000.0000 ug | Freq: Once | INTRAMUSCULAR | Status: AC
  Administered 2012-12-10: 1000 ug via INTRAMUSCULAR

## 2012-12-10 NOTE — Progress Notes (Signed)
Sinda S Foote presents today for injection per MD orders. B12 1000 mcg administered SQ in right Gluteal. Administration without incident. Patient tolerated well.  

## 2013-01-01 ENCOUNTER — Other Ambulatory Visit: Payer: Self-pay | Admitting: *Deleted

## 2013-01-01 DIAGNOSIS — M1711 Unilateral primary osteoarthritis, right knee: Secondary | ICD-10-CM

## 2013-01-01 MED ORDER — DICLOFENAC POTASSIUM 50 MG PO TABS: 50.0000 mg | ORAL_TABLET | Freq: Two times a day (BID) | ORAL | Status: DC

## 2013-01-07 ENCOUNTER — Ambulatory Visit: Payer: Federal, State, Local not specified - PPO

## 2013-01-08 ENCOUNTER — Ambulatory Visit (HOSPITAL_COMMUNITY): Payer: Federal, State, Local not specified - PPO

## 2013-01-14 ENCOUNTER — Ambulatory Visit (INDEPENDENT_AMBULATORY_CARE_PROVIDER_SITE_OTHER): Payer: Federal, State, Local not specified - PPO

## 2013-01-14 ENCOUNTER — Encounter (HOSPITAL_COMMUNITY): Payer: Federal, State, Local not specified - PPO | Attending: Oncology

## 2013-01-14 VITALS — BP 112/75 | HR 75

## 2013-01-14 DIAGNOSIS — Z23 Encounter for immunization: Secondary | ICD-10-CM

## 2013-01-14 DIAGNOSIS — E538 Deficiency of other specified B group vitamins: Secondary | ICD-10-CM | POA: Insufficient documentation

## 2013-01-14 MED ORDER — CYANOCOBALAMIN 1000 MCG/ML IJ SOLN
1000.0000 ug | Freq: Once | INTRAMUSCULAR | Status: AC
  Administered 2013-01-14: 1000 ug via INTRAMUSCULAR

## 2013-01-14 MED ORDER — CYANOCOBALAMIN 1000 MCG/ML IJ SOLN
INTRAMUSCULAR | Status: AC
  Filled 2013-01-14: qty 1

## 2013-01-14 NOTE — Progress Notes (Signed)
Sheri Jones presents today for injection per MD orders. B12 1000mcg administered IM in left Gluteal. Administration without incident. Patient tolerated well.  

## 2013-02-11 ENCOUNTER — Ambulatory Visit (HOSPITAL_COMMUNITY): Payer: Federal, State, Local not specified - PPO

## 2013-02-11 ENCOUNTER — Encounter (HOSPITAL_COMMUNITY): Payer: Federal, State, Local not specified - PPO | Attending: Oncology

## 2013-02-11 VITALS — BP 127/75 | HR 79

## 2013-02-11 DIAGNOSIS — E538 Deficiency of other specified B group vitamins: Secondary | ICD-10-CM

## 2013-02-11 MED ORDER — CYANOCOBALAMIN 1000 MCG/ML IJ SOLN
1000.0000 ug | Freq: Once | INTRAMUSCULAR | Status: AC
  Administered 2013-02-11: 1000 ug via INTRAMUSCULAR

## 2013-02-11 MED ORDER — CYANOCOBALAMIN 1000 MCG/ML IJ SOLN
INTRAMUSCULAR | Status: AC
  Filled 2013-02-11: qty 1

## 2013-02-11 NOTE — Progress Notes (Signed)
Vitamin B-12 1000 mcg given in left buttock IM Z-track.  Tolerated well.

## 2013-02-16 ENCOUNTER — Other Ambulatory Visit: Payer: Self-pay | Admitting: *Deleted

## 2013-02-16 ENCOUNTER — Telehealth: Payer: Self-pay | Admitting: Orthopedic Surgery

## 2013-02-16 DIAGNOSIS — M1711 Unilateral primary osteoarthritis, right knee: Secondary | ICD-10-CM

## 2013-02-16 MED ORDER — DICLOFENAC POTASSIUM 50 MG PO TABS: 50.0000 mg | ORAL_TABLET | Freq: Two times a day (BID) | ORAL | Status: DC

## 2013-02-16 NOTE — Telephone Encounter (Signed)
Prescription sent to pharmacy.

## 2013-02-16 NOTE — Telephone Encounter (Signed)
Received call from patient, requests medication refill for Diclofenac/Pot 50 mg, dosage 1 tablet, 2 times per day, from [facility], [place], ph# there is [phone].  Patient ph# is [phone].

## 2013-03-11 ENCOUNTER — Encounter (HOSPITAL_COMMUNITY): Payer: Federal, State, Local not specified - PPO | Attending: Oncology

## 2013-03-11 VITALS — BP 129/75 | HR 76

## 2013-03-11 DIAGNOSIS — E538 Deficiency of other specified B group vitamins: Secondary | ICD-10-CM | POA: Insufficient documentation

## 2013-03-11 MED ORDER — CYANOCOBALAMIN 1000 MCG/ML IJ SOLN
1000.0000 ug | Freq: Once | INTRAMUSCULAR | Status: AC
  Administered 2013-03-11: 1000 ug via INTRAMUSCULAR
  Filled 2013-03-11: qty 1

## 2013-03-11 NOTE — Progress Notes (Signed)
Mckynna S Fromer presents today for injection per MD orders. B12 1000 mcg administered IM in right Gluteal. Administration without incident. Patient tolerated well.  

## 2013-03-16 ENCOUNTER — Ambulatory Visit (INDEPENDENT_AMBULATORY_CARE_PROVIDER_SITE_OTHER): Payer: Federal, State, Local not specified - PPO | Admitting: Family Medicine

## 2013-03-16 ENCOUNTER — Encounter: Payer: Self-pay | Admitting: Family Medicine

## 2013-03-16 VITALS — BP 118/80 | HR 73 | Resp 16 | Ht 63.0 in | Wt 207.1 lb

## 2013-03-16 DIAGNOSIS — I10 Essential (primary) hypertension: Secondary | ICD-10-CM

## 2013-03-16 DIAGNOSIS — L680 Hirsutism: Secondary | ICD-10-CM

## 2013-03-16 DIAGNOSIS — R7302 Impaired glucose tolerance (oral): Secondary | ICD-10-CM

## 2013-03-16 DIAGNOSIS — R5381 Other malaise: Secondary | ICD-10-CM

## 2013-03-16 DIAGNOSIS — E559 Vitamin D deficiency, unspecified: Secondary | ICD-10-CM

## 2013-03-16 DIAGNOSIS — L678 Other hair color and hair shaft abnormalities: Secondary | ICD-10-CM

## 2013-03-16 DIAGNOSIS — R7309 Other abnormal glucose: Secondary | ICD-10-CM

## 2013-03-16 DIAGNOSIS — E669 Obesity, unspecified: Secondary | ICD-10-CM

## 2013-03-16 DIAGNOSIS — R7303 Prediabetes: Secondary | ICD-10-CM

## 2013-03-16 LAB — BASIC METABOLIC PANEL
CO2: 29 mEq/L (ref 19–32)
Calcium: 9.1 mg/dL (ref 8.4–10.5)
Creat: 0.82 mg/dL (ref 0.50–1.10)
Sodium: 141 mEq/L (ref 135–145)

## 2013-03-16 LAB — HEMOGLOBIN A1C
Hgb A1c MFr Bld: 5.7 % — ABNORMAL HIGH (ref <5.7)
Mean Plasma Glucose: 117 mg/dL — ABNORMAL HIGH (ref <117)

## 2013-03-16 MED ORDER — PHENTERMINE HCL 37.5 MG PO TABS: 37.5000 mg | ORAL_TABLET | Freq: Every day | ORAL | Status: DC

## 2013-03-16 MED ORDER — EFLORNITHINE HCL 13.9 % EX CREA: TOPICAL_CREAM | CUTANEOUS | Status: DC

## 2013-03-16 MED ORDER — EFLORNITHINE HCL 13.9 % EX CREA: TOPICAL_CREAM | CUTANEOUS | Status: AC

## 2013-03-16 NOTE — Progress Notes (Signed)
  Subjective:    Patient ID: Sheri Jones, female    DOB: 11-Sep-1945, 67 y.o.   MRN: [id number]  HPI The PT is here for follow up and re-evaluation of chronic medical conditions, medication management and review of any available recent lab and radiology data.  Preventive health is updated, specifically  Cancer screening and Immunization.   Questions or concerns regarding consultations or procedures which the PT has had in the interim are  addressed. The PT denies any adverse reactions to current medications since the last visit.  There are no new concerns.  There are no specific complaints       Review of Systems See HPI Denies recent fever or chills. Denies sinus pressure, nasal congestion, ear pain or sore throat. Denies chest congestion, productive cough or wheezing. Denies chest pains, palpitations and leg swelling Denies abdominal pain, nausea, vomiting,diarrhea or constipation.   Denies dysuria, frequency, hesitancy or incontinence. Chronic knee pain and instability with  limitation in mobility.Still chooses walking as her preferred method of exercise Denies headaches, seizures, numbness, or tingling. Denies depression, anxiety or insomnia. Denies skin break down or rash.        Objective:   Physical Exam Patient alert and oriented and in no cardiopulmonary distress.  HEENT: No facial asymmetry, EOMI, no sinus tenderness,  oropharynx pink and moist.  Neck supple no adenopathy.  Chest: Clear to auscultation bilaterally.  CVS: S1, S2 no murmurs, no S3.  ABD: Soft non tender. Bowel sounds normal.  Ext: No edema  MS: Adequate ROM spine, shoulders, hips and knees.  Skin: Intact, no ulcerations or rash noted.  Psych: Good eye contact, normal affect. Memory intact not anxious or depressed appearing.  CNS: CN 2-12 intact, power, tone and sensation normal throughout.        Assessment & Plan:

## 2013-03-16 NOTE — Patient Instructions (Addendum)
F/u in 3 month, and 3 weeks, call if you need me before  Pneumonia vaccine today  Start phentermine half tablet every morning with breakfast, and make changes as discussed.  Weight loss goal of 2.5 pounds per month  Labs today are vit D , hBA1C and chem 7  Medication sent in for facial hair as discussed  Check your insurance for coverage /help with gym membership

## 2013-03-17 ENCOUNTER — Encounter: Payer: Self-pay | Admitting: Family Medicine

## 2013-03-17 LAB — VITAMIN D 25 HYDROXY (VIT D DEFICIENCY, FRACTURES): Vit D, 25-Hydroxy: 22 ng/mL — ABNORMAL LOW (ref 30–89)

## 2013-03-17 MED ORDER — ERGOCALCIFEROL 1.25 MG (50000 UT) PO CAPS: 50000.0000 [IU] | ORAL_CAPSULE | ORAL | Status: DC

## 2013-03-21 ENCOUNTER — Encounter: Payer: Self-pay | Admitting: Family Medicine

## 2013-03-21 NOTE — Assessment & Plan Note (Signed)
Daily topical agent for control, med sent in

## 2013-03-21 NOTE — Assessment & Plan Note (Signed)
Unchanged Patient re-educated about  the importance of commitment to a  minimum of 150 minutes of exercise per week. The importance of healthy food choices with portion control discussed. Encouraged to start a food diary, count calories and to consider  joining a support group. Sample diet sheets offered. Goals set by the patient for the next several months.  She will start half phentermine daily

## 2013-03-21 NOTE — Assessment & Plan Note (Signed)
Needs weekly supplement for at least 6 month

## 2013-03-21 NOTE — Assessment & Plan Note (Signed)
Controlled, no change in medication DASH diet and commitment to daily physical activity for a minimum of 30 minutes discussed and encouraged, as a part of hypertension management. The importance of attaining a healthy weight is also discussed.  

## 2013-03-21 NOTE — Assessment & Plan Note (Signed)
Deteriorated Patient educated about the importance of limiting  Carbohydrate intake , the need to commit to daily physical activity for a minimum of 30 minutes , and to commit weight loss. The fact that changes in all these areas will reduce or eliminate all together the development of diabetes is stressed.    

## 2013-04-10 ENCOUNTER — Encounter (HOSPITAL_COMMUNITY): Payer: Federal, State, Local not specified - PPO | Attending: Oncology

## 2013-04-10 VITALS — BP 127/75 | HR 80

## 2013-04-10 DIAGNOSIS — E538 Deficiency of other specified B group vitamins: Secondary | ICD-10-CM | POA: Insufficient documentation

## 2013-04-10 MED ORDER — CYANOCOBALAMIN 1000 MCG/ML IJ SOLN
INTRAMUSCULAR | Status: AC
Start: 2013-04-10 — End: 2013-04-10
  Filled 2013-04-10: qty 1

## 2013-04-10 MED ORDER — CYANOCOBALAMIN 1000 MCG/ML IJ SOLN
1000.0000 ug | Freq: Once | INTRAMUSCULAR | Status: AC
  Administered 2013-04-10: 1000 ug via INTRAMUSCULAR

## 2013-04-10 NOTE — Progress Notes (Signed)
Tolerated VItamin B 12 1000 mcg to left hip, upper outer quadrant, well.

## 2013-05-06 ENCOUNTER — Encounter (HOSPITAL_COMMUNITY): Payer: Federal, State, Local not specified - PPO | Attending: Oncology

## 2013-05-06 VITALS — BP 129/84 | HR 85

## 2013-05-06 DIAGNOSIS — E538 Deficiency of other specified B group vitamins: Secondary | ICD-10-CM | POA: Insufficient documentation

## 2013-05-06 MED ORDER — CYANOCOBALAMIN 1000 MCG/ML IJ SOLN
1000.0000 ug | Freq: Once | INTRAMUSCULAR | Status: AC
  Administered 2013-05-06: 1000 ug via INTRAMUSCULAR
  Filled 2013-05-06: qty 1

## 2013-05-06 NOTE — Progress Notes (Signed)
Sheri Jones presents today for injection per MD orders. B12 1000 mcg administered SQ in right Gluteal area. Administration without incident. Patient tolerated well.  

## 2013-05-08 ENCOUNTER — Ambulatory Visit (HOSPITAL_COMMUNITY): Payer: Federal, State, Local not specified - PPO

## 2013-06-01 ENCOUNTER — Other Ambulatory Visit (HOSPITAL_COMMUNITY): Payer: Self-pay

## 2013-06-01 DIAGNOSIS — E538 Deficiency of other specified B group vitamins: Secondary | ICD-10-CM

## 2013-06-04 NOTE — Progress Notes (Signed)
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   INTERVAL HISTORY: Sheri Jones 68 y.o. 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   I personally reviewed and went over laboratory results with the patient.  The results are noted within this dictation.  I personally reviewed and went over radiographic studies with the patient.  The results are noted within this dictation.  Last mammogram on 11/19/2012 was BIRADS 1 and she will be due for her next screening mammogram in July 2015.   She denies any hematologic complaints. She denies any blood in her stool, black sticky stools, hematuria, blood loss, etc.  I provided the patient education regarding her B12 deficiency. This is secondary to her gastric bypass surgery. I will check iron studies and serum copper in the future with her next laboratory work.  The patient try to lose weight and I've encouraged her to continue working on this.  She reports that she's also with her primary care physician every 6 months or so have encouraged her to continue followup care with Dr. Moshe Cipro.  Hematologically, the patient denies any complaints and ROS questioning is negative.  Past Medical History  Diagnosis Date  . Allergic rhinitis due to pollen   . Osteoarthrosis, unspecified whether generalized or localized, lower leg   . Obesity, unspecified   . Unspecified essential hypertension   . Anemia   . Diabetes mellitus without  complication   . 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    has B12 DEFICIENCY; OBESITY; HYPERTENSION; ALLERGIC RHINITIS, SEASONAL; KNEE, ARTHRITIS, DEGEN./OSTEO; SPONDYLOLITHESIS; FATIGUE; PERSONAL HX COLONIC POLYPS; Abnormal facial hair; Prediabetes; Unspecified vitamin D deficiency; Routine general medical examination at a health care facility; and Gastric bypass status for obesity on her problem list.     has No Known Allergies.  Sheri Jones does not currently have medications on file.  Past Surgical History  Procedure Laterality Date  . Gastric bypass  2004  . Colonoscopy  2008    Denies any headaches, dizziness, double vision, fevers, chills, night sweats, nausea, vomiting, diarrhea, constipation, chest pain, heart palpitations, shortness of breath, blood in stool, black tarry stool, urinary pain, urinary burning, urinary frequency, hematuria.   PHYSICAL EXAMINATION  ECOG PERFORMANCE STATUS: 0 - Asymptomatic  There were no vitals filed for this visit.  GENERAL:alert, healthy, no distress, well nourished, well developed, comfortable, cooperative, obese and smiling SKIN: skin color, texture, turgor are normal, no rashes or significant lesions HEAD: Normocephalic, No masses, lesions, tenderness or abnormalities EYES: normal, PERRLA, EOMI, Conjunctiva are pink and non-injected EARS: External ears normal OROPHARYNX:mucous membranes are moist  NECK: supple, no adenopathy, thyroid normal size, non-tender, without nodularity, no stridor, non-tender, trachea midline LYMPH:  no palpable lymphadenopathy, no hepatosplenomegaly BREAST:not examined LUNGS: clear to auscultation and percussion HEART: regular rate & rhythm, no murmurs, no gallops, S1 normal and S2 normal ABDOMEN:abdomen soft, non-tender, obese, normal bowel sounds, no masses or organomegaly and no hepatosplenomegaly BACK: Back symmetric, no curvature., No CVA  tenderness EXTREMITIES:less then 2 second capillary refill, no  joint deformities, effusion, or inflammation, no edema, no skin discoloration, no clubbing, no cyanosis  NEURO: alert & oriented x 3 with fluent speech, no focal motor/sensory deficits, gait normal   LABORATORY DATA: CBC    Component Value Date/Time   WBC 5.1 06/05/2013 1049   RBC 4.16 06/05/2013 1049   HGB 13.4 06/05/2013 1049   HCT 38.4 06/05/2013 1049   PLT 167 06/05/2013 1049   MCV 92.3 06/05/2013 1049   MCH 32.2 06/05/2013 1049   MCHC 34.9 06/05/2013 1049   RDW 15.1 06/05/2013 1049   LYMPHSABS 1.8 06/05/2013 1049   MONOABS 0.5 06/05/2013 1049   EOSABS 0.2 06/05/2013 1049   BASOSABS 0.0 06/05/2013 1049      RADIOGRAPHIC STUDIES:  11/19/2012  *RADIOLOGY REPORT*  Clinical Data: Screening.  DIGITAL SCREENING BILATERAL MAMMOGRAM WITH CAD  Comparison: Previous exam(s).  FINDINGS:  ACR Breast Density Category a: The breast tissue is almost  entirely fatty.  There are no findings suspicious for malignancy.  Images were processed with CAD.  IMPRESSION:  No mammographic evidence of malignancy.  A result letter of this screening mammogram will be mailed directly  to the patient.  RECOMMENDATION:  Screening mammogram in one year. (Code:SM-B-01Y)  BI-RADS CATEGORY 1: Negative.  Original Report Authenticated By: Claudie Revering, M.D.    ASSESSMENT:  1. 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 2. Gastric bypass surgery January 2004 3. Obesity 4. HTN  Patient Active Problem List   Diagnosis Date Noted  . 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 Routine general medical examination at a health care facility 04/05/2012  . Prediabetes 08/07/2011  . Unspecified vitamin D deficiency 08/07/2011  . Abnormal facial hair 03/29/2011  . PERSONAL HX COLONIC POLYPS 07/05/2010  . KNEE, ARTHRITIS, DEGEN./OSTEO 01/16/2010  . FATIGUE 01/27/2009    . B12 DEFICIENCY 10/28/2008  . 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 OBESITY 08/15/2007  . HYPERTENSION 08/15/2007  . ALLERGIC RHINITIS, SEASONAL 08/15/2007    PLAN:  1. I personally reviewed and went over radiographic studies with the patient.  The results are noted within this dictation.   2. I personally reviewed and went over laboratory results with the patient.  The results are noted within this dictation. 3. Next screening mammogram is due in July 2015 4. Continue Vitamin B 12 1000 mcg every 4 weeks.  5. Vitamin B 12 injection today 6. Labs in 1 year: CBC diff, Vit B12, Iron/TIBC, ferritin, copper level 7. Return in 1 year for follow-up.   THERAPY PLAN:  We will continue to support her blood counts and administer monthly Vitamin B 12 injections.   All questions were answered. The patient knows to call the clinic with any problems, questions or concerns. We can certainly see the patient much sooner if necessary.  Patient and plan discussed with Dr. Farrel Gobble and he is in agreement with the aforementioned.   KEFALAS,Glogowski

## 2013-06-05 ENCOUNTER — Encounter (HOSPITAL_BASED_OUTPATIENT_CLINIC_OR_DEPARTMENT_OTHER): Payer: Federal, State, Local not specified - PPO

## 2013-06-05 ENCOUNTER — Encounter (HOSPITAL_COMMUNITY): Payer: Self-pay | Admitting: Oncology

## 2013-06-05 ENCOUNTER — Encounter (HOSPITAL_BASED_OUTPATIENT_CLINIC_OR_DEPARTMENT_OTHER): Payer: Federal, State, Local not specified - PPO | Admitting: Oncology

## 2013-06-05 ENCOUNTER — Encounter (HOSPITAL_COMMUNITY): Payer: Federal, State, Local not specified - PPO | Attending: Oncology

## 2013-06-05 DIAGNOSIS — E538 Deficiency of other specified B group vitamins: Secondary | ICD-10-CM

## 2013-06-05 DIAGNOSIS — C50219 Malignant neoplasm of upper-inner quadrant of unspecified female breast: Secondary | ICD-10-CM

## 2013-06-05 DIAGNOSIS — Z9884 Bariatric surgery status: Secondary | ICD-10-CM | POA: Insufficient documentation

## 2013-06-05 HISTORY — DX: Bariatric surgery status: Z98.84

## 2013-06-05 LAB — CBC WITH DIFFERENTIAL/PLATELET
BASOS PCT: 0 % (ref 0–1)
Basophils Absolute: 0 10*3/uL (ref 0.0–0.1)
Eosinophils Absolute: 0.2 10*3/uL (ref 0.0–0.7)
Eosinophils Relative: 5 % (ref 0–5)
HCT: 38.4 % (ref 36.0–46.0)
Hemoglobin: 13.4 g/dL (ref 12.0–15.0)
Lymphocytes Relative: 34 % (ref 12–46)
Lymphs Abs: 1.8 10*3/uL (ref 0.7–4.0)
MCH: 32.2 pg (ref 26.0–34.0)
MCHC: 34.9 g/dL (ref 30.0–36.0)
MCV: 92.3 fL (ref 78.0–100.0)
MONO ABS: 0.5 10*3/uL (ref 0.1–1.0)
Monocytes Relative: 9 % (ref 3–12)
NEUTROS ABS: 2.7 10*3/uL (ref 1.7–7.7)
NEUTROS PCT: 52 % (ref 43–77)
PLATELETS: 167 10*3/uL (ref 150–400)
RBC: 4.16 MIL/uL (ref 3.87–5.11)
RDW: 15.1 % (ref 11.5–15.5)
WBC: 5.1 10*3/uL (ref 4.0–10.5)

## 2013-06-05 MED ORDER — CYANOCOBALAMIN 1000 MCG/ML IJ SOLN
1000.0000 ug | Freq: Once | INTRAMUSCULAR | Status: AC
  Administered 2013-06-05: 1000 ug via INTRAMUSCULAR
  Filled 2013-06-05: qty 1

## 2013-06-05 NOTE — Patient Instructions (Signed)
[facility] Discharge Instructions  RECOMMENDATIONS MADE BY THE CONSULTANT AND ANY TEST RESULTS WILL BE SENT TO YOUR REFERRING PHYSICIAN.  MEDICATIONS PRESCRIBED:  None.   Continue Vitamin B12 1000 mcg every 4 weeks  INSTRUCTIONS GIVEN AND DISCUSSED: None  SPECIAL INSTRUCTIONS/FOLLOW-UP: Return in 1 year for follow-up and labs.  Follow-up with Primary Care Physician as directed.  Thank you for choosing [facility] to provide your oncology and hematology care.  To afford each patient quality time with our providers, please arrive at least 15 minutes before your scheduled appointment time.  With your help, our goal is to use those 15 minutes to complete the necessary work-up to ensure our physicians have the information they need to help with your evaluation and healthcare recommendations.    Effective January 1st, 2014, we ask that you re-schedule your appointment with our physicians should you arrive 10 or more minutes late for your appointment.  We strive to give you quality time with our providers, and arriving late affects you and other patients whose appointments are after yours.    Again, thank you for choosing [facility].  Our hope is that these requests will decrease the amount of time that you wait before being seen by our physicians.       _____________________________________________________________  Should you have questions after your visit to [facility], please contact our office at [phone] between the hours of 8:30 a.m. and 5:00 p.m.  Voicemails left after 4:30 p.m. will not be returned until the following business day.  For prescription refill requests, have your pharmacy contact our office with your prescription refill request.

## 2013-06-05 NOTE — Progress Notes (Signed)
Labs drawn today for cbc/diff 

## 2013-06-05 NOTE — Progress Notes (Signed)
Sheri Jones presents today for injection per MD orders. B12 1000 mcg  administered IM in right upper outer Gluteal. Administration without incident. Patient tolerated well.  

## 2013-06-08 ENCOUNTER — Ambulatory Visit (HOSPITAL_COMMUNITY): Payer: Federal, State, Local not specified - PPO | Admitting: Oncology

## 2013-07-03 ENCOUNTER — Encounter (HOSPITAL_COMMUNITY): Payer: Federal, State, Local not specified - PPO | Attending: Oncology

## 2013-07-03 VITALS — BP 141/85 | HR 90

## 2013-07-03 DIAGNOSIS — E538 Deficiency of other specified B group vitamins: Secondary | ICD-10-CM

## 2013-07-03 MED ORDER — CYANOCOBALAMIN 1000 MCG/ML IJ SOLN
INTRAMUSCULAR | Status: AC
  Filled 2013-07-03: qty 1

## 2013-07-03 MED ORDER — CYANOCOBALAMIN 1000 MCG/ML IJ SOLN
1000.0000 ug | Freq: Once | INTRAMUSCULAR | Status: AC
  Administered 2013-07-03: 1000 ug via INTRAMUSCULAR

## 2013-07-03 NOTE — Progress Notes (Signed)
Sheri Jones presents today for injection per MD orders. B12 1000 mcg administered SQ in right gluteal. Administration without incident. Patient tolerated well.

## 2013-07-07 ENCOUNTER — Ambulatory Visit: Payer: Federal, State, Local not specified - PPO | Admitting: Family Medicine

## 2013-07-11 ENCOUNTER — Other Ambulatory Visit: Payer: Self-pay | Admitting: Family Medicine

## 2013-07-22 ENCOUNTER — Ambulatory Visit (INDEPENDENT_AMBULATORY_CARE_PROVIDER_SITE_OTHER): Payer: Federal, State, Local not specified - PPO | Admitting: Family Medicine

## 2013-07-22 ENCOUNTER — Encounter: Payer: Self-pay | Admitting: Family Medicine

## 2013-07-22 VITALS — BP 132/82 | HR 78 | Resp 16 | Wt 202.4 lb

## 2013-07-22 DIAGNOSIS — I10 Essential (primary) hypertension: Secondary | ICD-10-CM

## 2013-07-22 DIAGNOSIS — Z79899 Other long term (current) drug therapy: Secondary | ICD-10-CM

## 2013-07-22 DIAGNOSIS — R5381 Other malaise: Secondary | ICD-10-CM

## 2013-07-22 DIAGNOSIS — R5383 Other fatigue: Secondary | ICD-10-CM

## 2013-07-22 DIAGNOSIS — E669 Obesity, unspecified: Secondary | ICD-10-CM

## 2013-07-22 DIAGNOSIS — E538 Deficiency of other specified B group vitamins: Secondary | ICD-10-CM

## 2013-07-22 DIAGNOSIS — E559 Vitamin D deficiency, unspecified: Secondary | ICD-10-CM

## 2013-07-22 DIAGNOSIS — R7303 Prediabetes: Secondary | ICD-10-CM

## 2013-07-22 DIAGNOSIS — Z13820 Encounter for screening for osteoporosis: Secondary | ICD-10-CM

## 2013-07-22 DIAGNOSIS — R7309 Other abnormal glucose: Secondary | ICD-10-CM

## 2013-07-22 LAB — LIPID PANEL
CHOL/HDL RATIO: 2.7 ratio
Cholesterol: 191 mg/dL (ref 0–200)
HDL: 72 mg/dL (ref >39)
LDL Cholesterol: 106 mg/dL — ABNORMAL HIGH (ref 0–99)
Triglycerides: 64 mg/dL (ref <150)
VLDL: 13 mg/dL (ref 0–40)

## 2013-07-22 LAB — BASIC METABOLIC PANEL
BUN: 19 mg/dL (ref 6–23)
CHLORIDE: 105 meq/L (ref 96–112)
CO2: 29 mEq/L (ref 19–32)
Calcium: 9.2 mg/dL (ref 8.4–10.5)
Creat: 0.78 mg/dL (ref 0.50–1.10)
GLUCOSE: 74 mg/dL (ref 70–99)
POTASSIUM: 3.8 meq/L (ref 3.5–5.3)
Sodium: 139 mEq/L (ref 135–145)

## 2013-07-22 LAB — HEMOGLOBIN A1C
Hgb A1c MFr Bld: 5.7 % — ABNORMAL HIGH (ref <5.7)
Mean Plasma Glucose: 117 mg/dL — ABNORMAL HIGH (ref <117)

## 2013-07-22 MED ORDER — PHENTERMINE HCL 37.5 MG PO TABS: 37.5000 mg | ORAL_TABLET | Freq: Every day | ORAL | Status: DC

## 2013-07-22 MED ORDER — HYDROCHLOROTHIAZIDE 25 MG PO TABS: ORAL_TABLET | ORAL | Status: DC

## 2013-07-22 NOTE — Patient Instructions (Signed)
F/u in 4 month, call if you need me before  Congrats on 5 pound weight loss   Increase in phentermine to ONE daily, goal  Of  6 to 8 pounds in the next 4 month. Cut out candy and chips, increase vegetable and fruit   Labs today, hBA1C, chem 7 , lipid, TSH

## 2013-07-23 LAB — TSH: TSH: 0.883 u[IU]/mL (ref 0.350–4.500)

## 2013-07-25 NOTE — Assessment & Plan Note (Signed)
Improved. Pt applauded on succesful weight loss through lifestyle change, and encouraged to continue same. Weight loss goal set for the next several months.  

## 2013-07-25 NOTE — Assessment & Plan Note (Signed)
Controlled, no change in medication DASH diet and commitment to daily physical activity for a minimum of 30 minutes discussed and encouraged, as a part of hypertension management. The importance of attaining a healthy weight is also discussed.  

## 2013-07-25 NOTE — Progress Notes (Signed)
   Subjective:    Patient ID: Sheri Jones, female    DOB: 1946/03/26, 68 y.o.   MRN: [id number]  HPI The PT is here for follow up and re-evaluation of chronic medical conditions, medication management and review of any available recent lab and radiology data.  Preventive health is updated, specifically  Cancer screening and Immunization.    The PT denies any adverse reactions to current medications since the last visit.  There are no new concerns.Spomewwhat frustrated with slow rate of weight loss but intends to continue to pursue this  There are no specific complaints       Review of Systems See HPI Denies recent fever or chills. Denies sinus pressure, nasal congestion, ear pain or sore throat. Denies chest congestion, productive cough or wheezing. Denies chest pains, palpitations and leg swelling Denies abdominal pain, nausea, vomiting,diarrhea or constipation.   Denies dysuria, frequency, hesitancy or incontinence. Chronic joint pain,specifically the knee and limitation in mobility.Followed by ortho Denies headaches, seizures, numbness, or tingling. Denies depression has increased , anxiety as she continues to help with caring for her elderly Mom denies  insomnia. Denies skin break down or rash.        Objective:   Physical Exam BP 132/82  Pulse 78  Resp 16  Wt 202 lb 6.4 oz (91.808 kg)  SpO2 97% Patient alert and oriented and in no cardiopulmonary distress.  HEENT: No facial asymmetry, EOMI, no sinus tenderness,  oropharynx pink and moist.  Neck supple no adenopathy.  Chest: Clear to auscultation bilaterally.  CVS: S1, S2 no murmurs, no S3.  ABD: Soft non tender. Bowel sounds normal.  Ext: No edema  MS: Adequate ROM spine, shoulders, hips andreduced in knees.  Skin: Intact, no ulcerations or rash noted.  Psych: Good eye contact, normal affect. Memory intact not anxious or depressed appearing.  CNS: CN 2-12 intact, power, tone and sensation normal  throughout.        Assessment & Plan:  Prediabetes Unchanged. Patient educated about the importance of limiting  Carbohydrate intake , the need to commit to daily physical activity for a minimum of 30 minutes , and to commit weight loss. The fact that changes in all these areas will reduce or eliminate all together the development of diabetes is stressed.     OBESITY Improved. Pt applauded on succesful weight loss through lifestyle change, and encouraged to continue same. Weight loss goal set for the next several months.   Unspecified vitamin D deficiency Needs to continue vit D supplement   HYPERTENSION Controlled, no change in medication DASH diet and commitment to daily physical activity for a minimum of 30 minutes discussed and encouraged, as a part of hypertension management. The importance of attaining a healthy weight is also discussed.   B12 DEFICIENCY Receives treatment through hematology clinic, s/p gastric bypass on replacement

## 2013-07-25 NOTE — Assessment & Plan Note (Signed)
Receives treatment through hematology clinic, s/p gastric bypass on replacement

## 2013-07-25 NOTE — Assessment & Plan Note (Signed)
Needs to continue vit D supplement

## 2013-07-25 NOTE — Assessment & Plan Note (Signed)
Unchanged Patient educated about the importance of limiting  Carbohydrate intake , the need to commit to daily physical activity for a minimum of 30 minutes , and to commit weight loss. The fact that changes in all these areas will reduce or eliminate all together the development of diabetes is stressed.    

## 2013-07-26 ENCOUNTER — Other Ambulatory Visit: Payer: Self-pay | Admitting: Family Medicine

## 2013-07-30 ENCOUNTER — Other Ambulatory Visit: Payer: Self-pay | Admitting: Family Medicine

## 2013-07-30 ENCOUNTER — Telehealth: Payer: Self-pay | Admitting: Family Medicine

## 2013-07-30 MED ORDER — METFORMIN HCL 500 MG PO TABS: 500.0000 mg | ORAL_TABLET | Freq: Every day | ORAL | Status: DC

## 2013-07-30 NOTE — Telephone Encounter (Signed)
Refilled her metformin 

## 2013-07-31 ENCOUNTER — Encounter (HOSPITAL_COMMUNITY): Payer: Federal, State, Local not specified - PPO | Attending: Oncology

## 2013-07-31 VITALS — BP 120/76 | HR 89 | Resp 18

## 2013-07-31 DIAGNOSIS — E538 Deficiency of other specified B group vitamins: Secondary | ICD-10-CM | POA: Insufficient documentation

## 2013-07-31 MED ORDER — CYANOCOBALAMIN 1000 MCG/ML IJ SOLN
1000.0000 ug | Freq: Once | INTRAMUSCULAR | Status: AC
  Administered 2013-07-31: 1000 ug via INTRAMUSCULAR
  Filled 2013-07-31: qty 1

## 2013-07-31 NOTE — Progress Notes (Signed)
Sheri Jones presents today for injection per MD orders. B12 1000 mcg administered IM in left gluteal. Administration without incident. Patient tolerated well.

## 2013-08-03 ENCOUNTER — Other Ambulatory Visit (HOSPITAL_COMMUNITY): Payer: Federal, State, Local not specified - PPO

## 2013-08-07 ENCOUNTER — Other Ambulatory Visit: Payer: Self-pay | Admitting: Family Medicine

## 2013-08-11 ENCOUNTER — Ambulatory Visit (INDEPENDENT_AMBULATORY_CARE_PROVIDER_SITE_OTHER): Payer: Federal, State, Local not specified - PPO | Admitting: Orthopedic Surgery

## 2013-08-11 ENCOUNTER — Ambulatory Visit (HOSPITAL_COMMUNITY)
Admission: RE | Admit: 2013-08-11 | Discharge: 2013-08-11 | Disposition: A | Discharge status: Home / Self Care | Payer: Federal, State, Local not specified - PPO | Source: Ambulatory Visit | Attending: Family Medicine | Admitting: Family Medicine

## 2013-08-11 ENCOUNTER — Ambulatory Visit (INDEPENDENT_AMBULATORY_CARE_PROVIDER_SITE_OTHER): Payer: Federal, State, Local not specified - PPO

## 2013-08-11 ENCOUNTER — Encounter: Payer: Self-pay | Admitting: Orthopedic Surgery

## 2013-08-11 VITALS — BP 131/83 | Ht 63.0 in | Wt 202.0 lb

## 2013-08-11 DIAGNOSIS — M25569 Pain in unspecified knee: Secondary | ICD-10-CM

## 2013-08-11 DIAGNOSIS — M1710 Unilateral primary osteoarthritis, unspecified knee: Secondary | ICD-10-CM

## 2013-08-11 DIAGNOSIS — M25561 Pain in right knee: Secondary | ICD-10-CM

## 2013-08-11 DIAGNOSIS — IMO0002 Reserved for concepts with insufficient information to code with codable children: Secondary | ICD-10-CM

## 2013-08-11 DIAGNOSIS — M1711 Unilateral primary osteoarthritis, right knee: Secondary | ICD-10-CM

## 2013-08-11 DIAGNOSIS — Z13820 Encounter for screening for osteoporosis: Secondary | ICD-10-CM | POA: Insufficient documentation

## 2013-08-11 NOTE — Progress Notes (Signed)
Subjective:     Patient ID: Sheri Jones, female   DOB: Nov 06, 1945, 68 y.o.   MRN: [id number] Chief Complaint  Patient presents with  . Follow-up    Increasing right knee pain, but not hurting today    HPI 68 years old history of osteoarthritis right knee with valgus deformity presents after treatment with diclofenac for several months now. Complains of increasing knee pain which actually resolved after she took some Tylenol with her diclofenac. She's not having severe pain she is functioning well she is walking for exercise and she assists her 31 year old mother with care.  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  . Right knee pain   . Osteoarthritis of right knee Yes       Plan:     Recommend followup in August. She's not having severe enough pain to have surgery. Although she is grade 4 arthritis she does not have any bone loss so no need to rush to surgery at this point

## 2013-08-12 ENCOUNTER — Encounter: Payer: Self-pay | Admitting: Family Medicine

## 2013-08-12 DIAGNOSIS — M8580 Other specified disorders of bone density and structure, unspecified site: Secondary | ICD-10-CM | POA: Insufficient documentation

## 2013-08-31 ENCOUNTER — Ambulatory Visit (HOSPITAL_COMMUNITY): Payer: Federal, State, Local not specified - PPO

## 2013-09-01 ENCOUNTER — Encounter (HOSPITAL_COMMUNITY): Payer: Federal, State, Local not specified - PPO | Attending: Oncology

## 2013-09-01 VITALS — BP 130/73 | HR 88

## 2013-09-01 DIAGNOSIS — E538 Deficiency of other specified B group vitamins: Secondary | ICD-10-CM | POA: Insufficient documentation

## 2013-09-01 MED ORDER — CYANOCOBALAMIN 1000 MCG/ML IJ SOLN
INTRAMUSCULAR | Status: AC
  Filled 2013-09-01: qty 1

## 2013-09-01 MED ORDER — CYANOCOBALAMIN 1000 MCG/ML IJ SOLN
1000.0000 ug | Freq: Once | INTRAMUSCULAR | Status: AC
  Administered 2013-09-01: 1000 ug via INTRAMUSCULAR

## 2013-09-01 NOTE — Progress Notes (Signed)
Alma Mohiuddin Franey's reason for visit today is for an injection and labs as scheduled per MD order.   [name] also received Vit B12 per MD orders; see [facility] for administration details.  JEANMARIE MCCOWEN tolerated all procedures well and without incident; questions were answered and patient was discharged.

## 2013-09-21 ENCOUNTER — Other Ambulatory Visit: Payer: Self-pay | Admitting: Family Medicine

## 2013-09-26 ENCOUNTER — Other Ambulatory Visit: Payer: Self-pay | Admitting: Family Medicine

## 2013-10-06 ENCOUNTER — Encounter (HOSPITAL_COMMUNITY): Payer: Federal, State, Local not specified - PPO | Attending: Oncology

## 2013-10-06 VITALS — BP 113/80 | HR 79 | Temp 97.3°F | Resp 18

## 2013-10-06 DIAGNOSIS — E538 Deficiency of other specified B group vitamins: Secondary | ICD-10-CM

## 2013-10-06 MED ORDER — CYANOCOBALAMIN 1000 MCG/ML IJ SOLN
INTRAMUSCULAR | Status: AC
  Filled 2013-10-06: qty 1

## 2013-10-06 MED ORDER — CYANOCOBALAMIN 1000 MCG/ML IJ SOLN
1000.0000 ug | Freq: Once | INTRAMUSCULAR | Status: AC
  Administered 2013-10-06: 1000 ug via INTRAMUSCULAR

## 2013-10-06 NOTE — Progress Notes (Signed)
Sheri Jones presents today for injection per MD orders. B12 1000 mcg administered SQ in right Gluteal. Administration without incident. Patient tolerated well.  

## 2013-10-13 ENCOUNTER — Ambulatory Visit: Payer: Federal, State, Local not specified - PPO | Admitting: Orthopedic Surgery

## 2013-10-13 ENCOUNTER — Other Ambulatory Visit: Payer: Self-pay | Admitting: Family Medicine

## 2013-10-13 DIAGNOSIS — Z1231 Encounter for screening mammogram for malignant neoplasm of breast: Secondary | ICD-10-CM

## 2013-11-03 ENCOUNTER — Encounter (HOSPITAL_COMMUNITY): Payer: Federal, State, Local not specified - PPO | Attending: Oncology

## 2013-11-03 ENCOUNTER — Ambulatory Visit (HOSPITAL_COMMUNITY): Payer: Federal, State, Local not specified - PPO

## 2013-11-03 VITALS — BP 132/72 | HR 84 | Resp 16

## 2013-11-03 DIAGNOSIS — E538 Deficiency of other specified B group vitamins: Secondary | ICD-10-CM | POA: Insufficient documentation

## 2013-11-03 MED ORDER — CYANOCOBALAMIN 1000 MCG/ML IJ SOLN
1000.0000 ug | Freq: Once | INTRAMUSCULAR | Status: AC
  Administered 2013-11-03: 1000 ug via INTRAMUSCULAR
  Filled 2013-11-03: qty 1

## 2013-11-03 NOTE — Progress Notes (Signed)
Sheri Jones presents today for injection per MD orders. B12 1000 mcg administered IM in right Gluteal. Administration without incident. Patient tolerated well.

## 2013-11-06 ENCOUNTER — Ambulatory Visit (HOSPITAL_COMMUNITY): Payer: Federal, State, Local not specified - PPO

## 2013-11-20 ENCOUNTER — Ambulatory Visit (HOSPITAL_COMMUNITY)
Admission: RE | Admit: 2013-11-20 | Discharge: 2013-11-20 | Disposition: A | Discharge status: Home / Self Care | Payer: Federal, State, Local not specified - PPO | Source: Ambulatory Visit | Attending: Family Medicine | Admitting: Family Medicine

## 2013-11-20 DIAGNOSIS — Z1231 Encounter for screening mammogram for malignant neoplasm of breast: Secondary | ICD-10-CM | POA: Insufficient documentation

## 2013-11-24 ENCOUNTER — Encounter: Payer: Self-pay | Admitting: Family Medicine

## 2013-11-24 ENCOUNTER — Ambulatory Visit (INDEPENDENT_AMBULATORY_CARE_PROVIDER_SITE_OTHER): Payer: Medicare Other | Admitting: Family Medicine

## 2013-11-24 VITALS — BP 120/78 | HR 84 | Resp 16 | Ht 63.0 in | Wt 192.1 lb

## 2013-11-24 DIAGNOSIS — R5381 Other malaise: Secondary | ICD-10-CM

## 2013-11-24 DIAGNOSIS — E669 Obesity, unspecified: Secondary | ICD-10-CM

## 2013-11-24 DIAGNOSIS — IMO0002 Reserved for concepts with insufficient information to code with codable children: Secondary | ICD-10-CM

## 2013-11-24 DIAGNOSIS — E538 Deficiency of other specified B group vitamins: Secondary | ICD-10-CM

## 2013-11-24 DIAGNOSIS — E559 Vitamin D deficiency, unspecified: Secondary | ICD-10-CM

## 2013-11-24 DIAGNOSIS — L678 Other hair color and hair shaft abnormalities: Secondary | ICD-10-CM

## 2013-11-24 DIAGNOSIS — R7303 Prediabetes: Secondary | ICD-10-CM

## 2013-11-24 DIAGNOSIS — I10 Essential (primary) hypertension: Secondary | ICD-10-CM

## 2013-11-24 DIAGNOSIS — R7309 Other abnormal glucose: Secondary | ICD-10-CM

## 2013-11-24 DIAGNOSIS — Z23 Encounter for immunization: Secondary | ICD-10-CM | POA: Insufficient documentation

## 2013-11-24 DIAGNOSIS — M1710 Unilateral primary osteoarthritis, unspecified knee: Secondary | ICD-10-CM

## 2013-11-24 DIAGNOSIS — L680 Hirsutism: Secondary | ICD-10-CM

## 2013-11-24 DIAGNOSIS — R5383 Other fatigue: Secondary | ICD-10-CM

## 2013-11-24 MED ORDER — PHENTERMINE HCL 37.5 MG PO TABS: 37.5000 mg | ORAL_TABLET | Freq: Every day | ORAL | Status: DC

## 2013-11-24 MED ORDER — METFORMIN HCL 500 MG PO TABS: ORAL_TABLET | ORAL | Status: DC

## 2013-11-24 MED ORDER — HYDROCHLOROTHIAZIDE 25 MG PO TABS: ORAL_TABLET | ORAL | Status: DC

## 2013-11-24 MED ORDER — POTASSIUM CHLORIDE ER 10 MEQ PO TBCR: EXTENDED_RELEASE_TABLET | ORAL | Status: DC

## 2013-11-24 NOTE — Patient Instructions (Signed)
Annual wellness in 4 month, call if you need me before  Congrats on 10 pound weight loss, keep it up!  Chem 7 , hBA1C and Vit D non fasting, 3 to 5 days before next visit   Prevnar today  Discontinue weekly vit d on completion of current , take calcium with Vit D daily   Phentermine HALF tablet daily for next 4 months, should plan to discontinue after that . Weight loss goal of 10 pounds in next 4 month

## 2013-11-24 NOTE — Assessment & Plan Note (Signed)
Patient educated about the importance of limiting  Carbohydrate intake , the need to commit to daily physical activity for a minimum of 30 minutes , and to commit weight loss. The fact that changes in all these areas will reduce or eliminate all together the development of diabetes is stressed.   Updated lab needed at/ before next visit.  

## 2013-11-24 NOTE — Assessment & Plan Note (Signed)
Unhappy with vaniqa due to

## 2013-11-24 NOTE — Assessment & Plan Note (Signed)
Improved. Pt applauded on succesful weight loss through lifestyle change, and encouraged to continue same. Weight loss goal set for the next several months.  

## 2013-11-24 NOTE — Assessment & Plan Note (Signed)
Will discontinue weekly on completion of current  Updated lab needed at/ before next visit.

## 2013-11-24 NOTE — Assessment & Plan Note (Signed)
Controlled, no change in medication DASH diet and commitment to daily physical activity for a minimum of 30 minutes discussed and encouraged, as a part of hypertension management. The importance of attaining a healthy weight is also discussed.  

## 2013-11-24 NOTE — Assessment & Plan Note (Signed)
vaccine administered

## 2013-11-24 NOTE — Progress Notes (Signed)
   Subjective:    Patient ID: Sheri Jones, female    DOB: 08/26/1945, 68 y.o.   MRN: [id number]  HPI The PT is here for follow up and re-evaluation of chronic medical conditions, medication management and review of any available recent lab and radiology data.  Preventive health is updated, specifically  Cancer screening and Immunization.   Seeing ortho re knee pain, maintained on meds, no interest in or need for surgery currently. The PT denies any adverse reactions to current medications since the last visit.  There are no new concerns.  There are no specific complaints       Review of Systems See HPI Denies recent fever or chills. Denies sinus pressure, nasal congestion, ear pain or sore throat. Denies chest congestion, productive cough or wheezing. Denies chest pains, palpitations and leg swelling Denies abdominal pain, nausea, vomiting,diarrhea or constipation.   Denies dysuria, frequency, hesitancy or incontinence. Right knee pain and stiffness with occasional instability, no falls Denies headaches, seizures, numbness, or tingling. Denies depression, anxiety or insomnia. Denies skin break down or rash.        Objective:   Physical Exam BP 120/78  Pulse 84  Resp 16  Ht 5\' 3"  (1.6 m)  Wt 192 lb 1.9 oz (87.145 kg)  BMI 34.04 kg/m2  SpO2 98% Patient alert and oriented and in no cardiopulmonary distress.  HEENT: No facial asymmetry, EOMI,   oropharynx pink and moist.  Neck supple no JVD, no mass.  Chest: Clear to auscultation bilaterally.  CVS: S1, S2 no murmurs, no S3.Regular rate.  ABD: Soft non tender.   Ext: No edema  MS: Adequate ROM spine, shoulders, hips and knees.  Skin: Intact, no ulcerations or rash noted.  Psych: Good eye contact, normal affect. Memory intact not anxious or depressed appearing.  CNS: CN 2-12 intact, power,  normal throughout.no focal deficits noted.        Assessment & Plan:  HYPERTENSION Controlled, no change in  medication DASH diet and commitment to daily physical activity for a minimum of 30 minutes discussed and encouraged, as a part of hypertension management. The importance of attaining a healthy weight is also discussed.    Prediabetes Patient educated about the importance of limiting  Carbohydrate intake , the need to commit to daily physical activity for a minimum of 30 minutes , and to commit weight loss. The fact that changes in all these areas will reduce or eliminate all together the development of diabetes is stressed.   Updated lab needed at/ before next visit.   Need for vaccination with 13-polyvalent pneumococcal conjugate vaccine vaccine administered   OBESITY Improved. Pt applauded on succesful weight loss through lifestyle change, and encouraged to continue same. Weight loss goal set for the next several months.   KNEE, ARTHRITIS, DEGEN./OSTEO Chronic right knee pain, good response to med by ortho, buckling but no falls  Abnormal facial hair Unhappy with vaniqa due to   Unspecified vitamin D deficiency Will discontinue weekly on completion of current  Updated lab needed at/ before next visit.

## 2013-11-24 NOTE — Assessment & Plan Note (Signed)
Chronic right knee pain, good response to med by ortho, buckling but no falls

## 2013-11-28 NOTE — Assessment & Plan Note (Signed)
Monthly B12 injections through hematology clinic

## 2013-12-01 ENCOUNTER — Encounter (HOSPITAL_COMMUNITY): Payer: Federal, State, Local not specified - PPO | Attending: Oncology

## 2013-12-01 VITALS — BP 127/71 | HR 79

## 2013-12-01 DIAGNOSIS — E538 Deficiency of other specified B group vitamins: Secondary | ICD-10-CM | POA: Insufficient documentation

## 2013-12-01 MED ORDER — CYANOCOBALAMIN 1000 MCG/ML IJ SOLN
1000.0000 ug | Freq: Once | INTRAMUSCULAR | Status: AC
  Administered 2013-12-01: 1000 ug via INTRAMUSCULAR

## 2013-12-01 MED ORDER — CYANOCOBALAMIN 1000 MCG/ML IJ SOLN
INTRAMUSCULAR | Status: AC
  Filled 2013-12-01: qty 1

## 2013-12-01 NOTE — Progress Notes (Signed)
Sheri Jones presents today for injection per MD orders. B12 1000 mcg administered IM in right Gluteal. Administration without incident. Patient tolerated well.

## 2013-12-11 ENCOUNTER — Other Ambulatory Visit: Payer: Self-pay | Admitting: Family Medicine

## 2013-12-14 ENCOUNTER — Telehealth: Payer: Self-pay | Admitting: Family Medicine

## 2013-12-15 ENCOUNTER — Ambulatory Visit: Payer: Federal, State, Local not specified - PPO | Admitting: Orthopedic Surgery

## 2013-12-15 NOTE — Telephone Encounter (Signed)
Noted that med sent on 8/4.  Will call and check to see if pharmacy received.

## 2013-12-15 NOTE — Telephone Encounter (Signed)
Med refilled.

## 2013-12-22 ENCOUNTER — Other Ambulatory Visit: Payer: Self-pay

## 2013-12-22 ENCOUNTER — Telehealth: Payer: Self-pay | Admitting: *Deleted

## 2013-12-22 NOTE — Telephone Encounter (Signed)
Patient aware that med was refaxed

## 2013-12-22 NOTE — Telephone Encounter (Signed)
Pt called an [facility] stating she is waiting to get her refill and she has not heard anything. Per pt Dr. Moshe Cipro wanted her on this medication for 6 months. Please advise [phone]

## 2013-12-29 ENCOUNTER — Encounter (HOSPITAL_COMMUNITY): Payer: Federal, State, Local not specified - PPO

## 2013-12-29 ENCOUNTER — Ambulatory Visit: Payer: Federal, State, Local not specified - PPO

## 2013-12-31 ENCOUNTER — Encounter (HOSPITAL_COMMUNITY): Payer: Self-pay

## 2013-12-31 ENCOUNTER — Ambulatory Visit (INDEPENDENT_AMBULATORY_CARE_PROVIDER_SITE_OTHER): Payer: Federal, State, Local not specified - PPO

## 2013-12-31 ENCOUNTER — Encounter (HOSPITAL_COMMUNITY): Payer: Federal, State, Local not specified - PPO | Attending: Oncology

## 2013-12-31 VITALS — BP 129/73 | HR 79 | Temp 97.6°F | Resp 16

## 2013-12-31 DIAGNOSIS — Z23 Encounter for immunization: Secondary | ICD-10-CM

## 2013-12-31 DIAGNOSIS — E538 Deficiency of other specified B group vitamins: Secondary | ICD-10-CM | POA: Insufficient documentation

## 2013-12-31 MED ORDER — CYANOCOBALAMIN 1000 MCG/ML IJ SOLN
1000.0000 ug | Freq: Once | INTRAMUSCULAR | Status: AC
  Administered 2013-12-31: 1000 ug via INTRAMUSCULAR
  Filled 2013-12-31: qty 1

## 2013-12-31 NOTE — Progress Notes (Signed)
Sheri Jones presents today for injection per MD orders. B12 1000 mcg administered SQ in right Gluteal area. Administration without incident. Patient tolerated well.

## 2014-01-05 ENCOUNTER — Ambulatory Visit (INDEPENDENT_AMBULATORY_CARE_PROVIDER_SITE_OTHER): Payer: Federal, State, Local not specified - PPO | Admitting: Orthopedic Surgery

## 2014-01-05 VITALS — BP 130/88 | Ht 63.0 in | Wt 192.1 lb

## 2014-01-05 DIAGNOSIS — M25561 Pain in right knee: Secondary | ICD-10-CM

## 2014-01-05 DIAGNOSIS — M25569 Pain in unspecified knee: Secondary | ICD-10-CM

## 2014-01-05 DIAGNOSIS — M1711 Unilateral primary osteoarthritis, right knee: Secondary | ICD-10-CM

## 2014-01-05 DIAGNOSIS — M1710 Unilateral primary osteoarthritis, unspecified knee: Secondary | ICD-10-CM

## 2014-01-05 MED ORDER — DICLOFENAC POTASSIUM 50 MG PO TABS: 50.0000 mg | ORAL_TABLET | Freq: Two times a day (BID) | ORAL | Status: DC

## 2014-01-05 NOTE — Progress Notes (Signed)
Chief Complaint  Patient presents with  . Follow-up    recheck right knee, discuss TKA    Established problem improved stable the patient will require prescription medication today.  The patient's right knee pain is only intermittent and at night.  She's been followed for knee arthritis with x-rays and she does have significant disease on x-ray but her symptoms are minimal and she ambulates greater than a mile and a half a day. Review of systems is negative for any other musculoskeletal complaints

## 2014-01-19 ENCOUNTER — Other Ambulatory Visit (HOSPITAL_COMMUNITY): Payer: Self-pay | Admitting: Oncology

## 2014-01-19 ENCOUNTER — Encounter (HOSPITAL_COMMUNITY): Payer: Self-pay | Admitting: Oncology

## 2014-01-25 ENCOUNTER — Other Ambulatory Visit: Payer: Self-pay | Admitting: *Deleted

## 2014-01-25 DIAGNOSIS — M25561 Pain in right knee: Secondary | ICD-10-CM

## 2014-01-25 DIAGNOSIS — M1711 Unilateral primary osteoarthritis, right knee: Secondary | ICD-10-CM

## 2014-01-25 MED ORDER — DICLOFENAC POTASSIUM 50 MG PO TABS: 50.0000 mg | ORAL_TABLET | Freq: Two times a day (BID) | ORAL | Status: DC

## 2014-01-26 ENCOUNTER — Encounter (HOSPITAL_COMMUNITY): Payer: Federal, State, Local not specified - PPO | Attending: Oncology

## 2014-01-26 VITALS — BP 116/67 | HR 85 | Resp 18

## 2014-01-26 DIAGNOSIS — E538 Deficiency of other specified B group vitamins: Secondary | ICD-10-CM

## 2014-01-26 MED ORDER — CYANOCOBALAMIN 1000 MCG/ML IJ SOLN
INTRAMUSCULAR | Status: AC
  Filled 2014-01-26: qty 1

## 2014-01-26 MED ORDER — CYANOCOBALAMIN 1000 MCG/ML IJ SOLN
1000.0000 ug | Freq: Once | INTRAMUSCULAR | Status: AC
  Administered 2014-01-26: 1000 ug via INTRAMUSCULAR

## 2014-01-26 NOTE — Progress Notes (Signed)
Deannah Rossi Hipps's reason for visit today is for an injection .  [name] also received vit b12 injection per MD orders; see [facility] for administration details.  SARYN CHERRY tolerated all procedures well and without incident; questions were answered and patient was discharged.

## 2014-01-26 NOTE — Patient Instructions (Signed)
[facility] Discharge Instructions  RECOMMENDATIONS MADE BY THE CONSULTANT AND ANY TEST RESULTS WILL BE SENT TO YOUR REFERRING PHYSICIAN.  You received vitamin b12 injection. Please call the clinic with any questions or concerns  Thank you for choosing [facility] to provide your oncology and hematology care.  To afford each patient quality time with our providers, please arrive at least 15 minutes before your scheduled appointment time.  With your help, our goal is to use those 15 minutes to complete the necessary work-up to ensure our physicians have the information they need to help with your evaluation and healthcare recommendations.    Effective January 1st, 2014, we ask that you re-schedule your appointment with our physicians should you arrive 10 or more minutes late for your appointment.  We strive to give you quality time with our providers, and arriving late affects you and other patients whose appointments are after yours.    Again, thank you for choosing [facility].  Our hope is that these requests will decrease the amount of time that you wait before being seen by our physicians.       _____________________________________________________________  Should you have questions after your visit to [facility], please contact our office at [phone] between the hours of 8:30 a.m. and 5:00 p.m.  Voicemails left after 4:30 p.m. will not be returned until the following business day.  For prescription refill requests, have your pharmacy contact our office with your prescription refill request.

## 2014-02-23 ENCOUNTER — Encounter (HOSPITAL_COMMUNITY): Payer: Federal, State, Local not specified - PPO | Attending: Oncology

## 2014-02-23 ENCOUNTER — Encounter (HOSPITAL_COMMUNITY): Payer: Self-pay

## 2014-02-23 DIAGNOSIS — E538 Deficiency of other specified B group vitamins: Secondary | ICD-10-CM | POA: Insufficient documentation

## 2014-02-23 MED ORDER — CYANOCOBALAMIN 1000 MCG/ML IJ SOLN
INTRAMUSCULAR | Status: AC
  Filled 2014-02-23: qty 1

## 2014-02-23 MED ORDER — CYANOCOBALAMIN 1000 MCG/ML IJ SOLN
1000.0000 ug | Freq: Once | INTRAMUSCULAR | Status: AC
  Administered 2014-02-23: 1000 ug via INTRAMUSCULAR

## 2014-02-23 NOTE — Progress Notes (Unsigned)
Sheri Jones presents today for injection per MD orders. B12 1000 mcg administered IM in right Gluteal. Administration without incident. Patient tolerated well.

## 2014-03-23 ENCOUNTER — Encounter (HOSPITAL_COMMUNITY): Payer: Self-pay

## 2014-03-23 ENCOUNTER — Encounter (HOSPITAL_COMMUNITY): Payer: Federal, State, Local not specified - PPO | Attending: Oncology

## 2014-03-23 ENCOUNTER — Other Ambulatory Visit: Payer: Self-pay | Admitting: Family Medicine

## 2014-03-23 DIAGNOSIS — E538 Deficiency of other specified B group vitamins: Secondary | ICD-10-CM

## 2014-03-23 MED ORDER — CYANOCOBALAMIN 1000 MCG/ML IJ SOLN
1000.0000 ug | Freq: Once | INTRAMUSCULAR | Status: AC
  Administered 2014-03-23: 1000 ug via INTRAMUSCULAR
  Filled 2014-03-23: qty 1

## 2014-03-23 NOTE — Progress Notes (Signed)
Sheri Jones's reason for visit today is for an injection and labs as scheduled per MD orders.  Labs were drawn prior to administration of ordered medication Sheri Jones also received vitamin b12 per MD orders; see [facility] for administration details.  Sheri Jones tolerated all procedures well and without incident; questions were answered and patient was discharged.

## 2014-03-23 NOTE — Patient Instructions (Signed)
You had a vitamin b12 injection today.  Please call the clinic if you have any questions or concerns

## 2014-03-31 LAB — BASIC METABOLIC PANEL
BUN: 20 mg/dL (ref 6–23)
CHLORIDE: 104 meq/L (ref 96–112)
CO2: 28 meq/L (ref 19–32)
Calcium: 8.8 mg/dL (ref 8.4–10.5)
Creat: 0.88 mg/dL (ref 0.50–1.10)
Glucose, Bld: 80 mg/dL (ref 70–99)
Potassium: 3.9 mEq/L (ref 3.5–5.3)
Sodium: 141 mEq/L (ref 135–145)

## 2014-03-31 LAB — HEMOGLOBIN A1C
Hgb A1c MFr Bld: 5.8 % — ABNORMAL HIGH (ref <5.7)
Mean Plasma Glucose: 120 mg/dL — ABNORMAL HIGH (ref <117)

## 2014-03-31 LAB — VITAMIN D 25 HYDROXY (VIT D DEFICIENCY, FRACTURES): Vit D, 25-Hydroxy: 22 ng/mL — ABNORMAL LOW (ref 30–100)

## 2014-04-06 ENCOUNTER — Ambulatory Visit (INDEPENDENT_AMBULATORY_CARE_PROVIDER_SITE_OTHER): Payer: Federal, State, Local not specified - PPO | Admitting: Family Medicine

## 2014-04-06 ENCOUNTER — Encounter: Payer: Self-pay | Admitting: Family Medicine

## 2014-04-06 VITALS — BP 114/80 | HR 69 | Resp 16 | Ht 63.0 in | Wt 191.0 lb

## 2014-04-06 DIAGNOSIS — I10 Essential (primary) hypertension: Secondary | ICD-10-CM

## 2014-04-06 DIAGNOSIS — R7303 Prediabetes: Secondary | ICD-10-CM

## 2014-04-06 DIAGNOSIS — Z0000 Encounter for general adult medical examination without abnormal findings: Secondary | ICD-10-CM

## 2014-04-06 DIAGNOSIS — H9191 Unspecified hearing loss, right ear: Secondary | ICD-10-CM | POA: Insufficient documentation

## 2014-04-06 DIAGNOSIS — Z13220 Encounter for screening for lipoid disorders: Secondary | ICD-10-CM

## 2014-04-06 MED ORDER — ERGOCALCIFEROL 1.25 MG (50000 UT) PO CAPS
50000.0000 [IU] | ORAL_CAPSULE | ORAL | Status: DC
Start: 2014-04-06 — End: 2014-07-29

## 2014-04-06 NOTE — Patient Instructions (Addendum)
Annual physical exam, call if you need me before  You are referred for hearing test  Use diclofenac one daily as needed, for knee pain, reduce to maybe 3 per week, use topical rub and tylenol;  19 pound weight loss in last 12 months, excellent, continue half phentermine daily  Change food choice consistently  Work on living will  Start once weekly vit d this is prescribed  Fasting labs in 4 month

## 2014-04-06 NOTE — Assessment & Plan Note (Signed)

## 2014-04-06 NOTE — Progress Notes (Signed)
Subjective:    Patient ID: Sheri Jones, female    DOB: 09/21/1945, 68 y.o.   MRN: [id number]  HPI  Preventive Screening-Counseling & Management   Patient present here today for a Medicare annual wellness visit.   Current Problems (verified)   Medications Prior to Visit Allergies (verified)   PAST HISTORY  Family History (verified)   Social History widow, no children, retired from [employer], production    Risk Factors  Current exercise habits:  Walks everyday for an hour   Dietary issues discussed: Limits sugar and drinks plenty of water. Heart healthy diet discussed, limits fried foods and eats a lot of vegetables    Cardiac risk factors: Father died of MI at age 35 , pt has hTN and is prediabetic  Depression Screen  (Note: if answer to either of the following is "Yes", a more complete depression screening is indicated)   Over the past two weeks, have you felt down, depressed or hopeless? No  Over the past two weeks, have you felt little interest or pleasure in doing things? No  Have you lost interest or pleasure in daily life? No  Do you often feel hopeless? No  Do you cry easily over simple problems? No   Activities of Daily Living  In your present state of health, do you have any difficulty performing the following activities?  Driving?: No Managing money?: No Feeding yourself?:No Getting from bed to chair?:No Climbing a flight of stairs?:yes at times due to right knee Preparing food and eating?:No Bathing or showering?:No Getting dressed?:No Getting to the toilet?:No Using the toilet?:No Moving around from place to place?: yes at times, rigth knee is painful and at times is unstable  Fall Risk Assessment In the past year have you fallen or had a near fall?:No Are you currently taking any medications that make you dizzy?:No   Hearing Difficulties: No Do you often ask people to speak up or repeat themselves?:sometimes in right ear  Do you experience ringing  or noises in your ears?:No Do you have difficulty understanding soft or whispered voices?: not really   Cognitive Testing  Alert? Yes Normal Appearance?Yes  Oriented to person? Yes Place? Yes  Time? Yes  Displays appropriate judgment?Yes  Can read the correct time from a watch face? yes Are you having problems remembering things?No  Advanced Directives have been discussed with the patient?Yes, in the process of getting living will, brochure given   Full code  List the Names of Other Physician/Practitioners you currently use:  Dr Aline Brochure (ortho)  Sheldon Silvan (oncology)   Indicate any recent Medical Services you may have received from other than [facility] providers in the past year (date may be approximate).   Assessment:    Annual Wellness Exam   Plan:    .  Medicare Attestation  I have personally reviewed:  The patient's medical and social history  Their use of alcohol, tobacco or illicit drugs  Their current medications and supplements  The patient's functional ability including ADLs,fall risks, home safety risks, cognitive, and hearing and visual impairment  Diet and physical activities  Evidence for depression or mood disorders  The patient's weight, height, BMI, and visual acuity have been recorded in the chart. I have made referrals, counseling, and provided education to the patient based on review of the above and I have provided the patient with a written personalized care plan for preventive services.     Review of Systems     Objective:   Physical Exam  BP 114/80 mmHg  Pulse 69  Resp 16  Ht 5\' 3"  (1.6 m)  Wt 191 lb (86.637 kg)  BMI 33.84 kg/m2  SpO2 99%        Assessment & Plan:  Medicare annual wellness visit, subsequent Annual exam as documented. Counseling done  re healthy lifestyle involving commitment to 150 minutes exercise per week, heart healthy diet, and attaining healthy weight.The importance of adequate sleep also discussed. Regular seat belt use  and home safety, is also discussed. Changes in health habits are decided on by the patient with goals and time frames  set for achieving them. Immunization and cancer screening needs are specifically addressed at this visit.

## 2014-04-22 ENCOUNTER — Ambulatory Visit (HOSPITAL_COMMUNITY): Payer: Federal, State, Local not specified - PPO

## 2014-04-26 ENCOUNTER — Encounter (HOSPITAL_COMMUNITY): Payer: Federal, State, Local not specified - PPO | Attending: Oncology

## 2014-04-26 DIAGNOSIS — E538 Deficiency of other specified B group vitamins: Secondary | ICD-10-CM

## 2014-04-26 MED ORDER — CYANOCOBALAMIN 1000 MCG/ML IJ SOLN
1000.0000 ug | Freq: Once | INTRAMUSCULAR | Status: AC
  Administered 2014-04-26: 1000 ug via INTRAMUSCULAR

## 2014-04-26 MED ORDER — CYANOCOBALAMIN 1000 MCG/ML IJ SOLN
INTRAMUSCULAR | Status: AC
  Filled 2014-04-26: qty 1

## 2014-04-26 NOTE — Progress Notes (Signed)
Sheri Jones presents today for injection per MD orders. B12 1000 mcg administered SQ in right Gluteal. Administration without incident. Patient tolerated well.

## 2014-04-26 NOTE — Patient Instructions (Signed)
[place] Discharge Instructions  RECOMMENDATIONS MADE BY THE CONSULTANT AND ANY TEST RESULTS WILL BE SENT TO YOUR REFERRING PHYSICIAN.  MEDICATIONS PRESCRIBED:  Vitamin B12 injection today.  INSTRUCTIONS/FOLLOW-UP: Continue monthly B12 injection. Return as scheduled.  Thank you for choosing [facility] to provide your oncology and hematology care.  To afford each patient quality time with our providers, please arrive at least 15 minutes before your scheduled appointment time.  With your help, our goal is to use those 15 minutes to complete the necessary work-up to ensure our physicians have the information they need to help with your evaluation and healthcare recommendations.    Effective January 1st, 2014, we ask that you re-schedule your appointment with our physicians should you arrive 10 or more minutes late for your appointment.  We strive to give you quality time with our providers, and arriving late affects you and other patients whose appointments are after yours.    Again, thank you for choosing [facility].  Our hope is that these requests will decrease the amount of time that you wait before being seen by our physicians.       _____________________________________________________________  Should you have questions after your visit to [facility], please contact our office at [phone] between the hours of 8:30 a.m. and 4:30 p.m.  Voicemails left after 4:30 p.m. will not be returned until the following business day.  For prescription refill requests, have your pharmacy contact our office with your prescription refill request.    _______________________________________________________________  We hope that we have given you very good care.  You may receive a patient satisfaction survey in the mail, please complete it and return it as soon as possible.  We value your  feedback!  _______________________________________________________________  Have you asked about our STAR program?  STAR stands for Survivorship Training and Rehabilitation, and this is a nationally recognized cancer care program that focuses on survivorship and rehabilitation.  Cancer and cancer treatments may cause problems, such as, pain, making you feel tired and keeping you from doing the things that you need or want to do. Cancer rehabilitation can help. Our goal is to reduce these troubling effects and help you have the best quality of life possible.  You may receive a survey from a nurse that asks questions about your current state of health.  Based on the survey results, all eligible patients will be referred to the [facility] program for an evaluation so we can better serve you!  A frequently asked questions sheet is available upon request.

## 2014-05-24 ENCOUNTER — Encounter (HOSPITAL_COMMUNITY): Payer: Federal, State, Local not specified - PPO | Attending: Oncology

## 2014-05-24 ENCOUNTER — Ambulatory Visit (HOSPITAL_COMMUNITY): Payer: Federal, State, Local not specified - PPO

## 2014-05-24 ENCOUNTER — Encounter (HOSPITAL_COMMUNITY): Payer: Self-pay

## 2014-05-24 DIAGNOSIS — I10 Essential (primary) hypertension: Secondary | ICD-10-CM | POA: Insufficient documentation

## 2014-05-24 DIAGNOSIS — E669 Obesity, unspecified: Secondary | ICD-10-CM | POA: Insufficient documentation

## 2014-05-24 DIAGNOSIS — E538 Deficiency of other specified B group vitamins: Secondary | ICD-10-CM

## 2014-05-24 DIAGNOSIS — E559 Vitamin D deficiency, unspecified: Secondary | ICD-10-CM | POA: Insufficient documentation

## 2014-05-24 DIAGNOSIS — E119 Type 2 diabetes mellitus without complications: Secondary | ICD-10-CM | POA: Insufficient documentation

## 2014-05-24 DIAGNOSIS — Z9884 Bariatric surgery status: Secondary | ICD-10-CM | POA: Insufficient documentation

## 2014-05-24 MED ORDER — CYANOCOBALAMIN 1000 MCG/ML IJ SOLN
1000.0000 ug | Freq: Once | INTRAMUSCULAR | Status: AC
  Administered 2014-05-24: 1000 ug via INTRAMUSCULAR
  Filled 2014-05-24: qty 1

## 2014-05-24 NOTE — Progress Notes (Signed)
Keleigh Kazee Micale's reason for visit today is for an injection and labs as scheduled per MD orders.  Labs were drawn prior to administration of ordered medication. [name] also received vitamin b12 injection per MD orders; see [facility] for administration details.  JAQUELYNE FIRKUS tolerated all procedures well and without incident; questions were answered and patient was discharged.

## 2014-05-24 NOTE — Patient Instructions (Signed)
[place] at [facility]  Discharge Instructions:  You had a b12 injection today. Call the clinic if you have any question or conerns _______________________________________________________________  Thank you for choosing [place] at [facility] to provide your oncology and hematology care.  To afford each patient quality time with our providers, please arrive at least 15 minutes before your scheduled appointment.  You need to re-schedule your appointment if you arrive 10 or more minutes late.  We strive to give you quality time with our providers, and arriving late affects you and other patients whose appointments are after yours.  Also, if you no show three or more times for appointments you may be dismissed from the clinic.  Again, thank you for choosing [place] at [place] hope is that these requests will allow you access to exceptional care and in a timely manner. _______________________________________________________________  If you have questions after your visit, please contact our office at [phone] between the hours of 8:30 a.m. and 5:00 p.m. Voicemails left after 4:30 p.m. will not be returned until the following business day. _______________________________________________________________  For prescription refill requests, have your pharmacy contact our office. _______________________________________________________________  Recommendations made by the consultant and any test results will be sent to your referring physician. _______________________________________________________________

## 2014-06-04 ENCOUNTER — Encounter (HOSPITAL_COMMUNITY): Payer: Self-pay | Admitting: Oncology

## 2014-06-04 ENCOUNTER — Encounter (HOSPITAL_BASED_OUTPATIENT_CLINIC_OR_DEPARTMENT_OTHER): Payer: Federal, State, Local not specified - PPO | Admitting: Oncology

## 2014-06-04 ENCOUNTER — Other Ambulatory Visit: Payer: Self-pay | Admitting: Family Medicine

## 2014-06-04 ENCOUNTER — Encounter (HOSPITAL_BASED_OUTPATIENT_CLINIC_OR_DEPARTMENT_OTHER): Payer: Federal, State, Local not specified - PPO

## 2014-06-04 VITALS — BP 134/68 | HR 70 | Temp 97.9°F | Resp 20 | Wt 189.6 lb

## 2014-06-04 DIAGNOSIS — E559 Vitamin D deficiency, unspecified: Secondary | ICD-10-CM

## 2014-06-04 DIAGNOSIS — E538 Deficiency of other specified B group vitamins: Secondary | ICD-10-CM | POA: Diagnosis present

## 2014-06-04 DIAGNOSIS — E539 Vitamin B deficiency, unspecified: Secondary | ICD-10-CM

## 2014-06-04 DIAGNOSIS — Z9884 Bariatric surgery status: Secondary | ICD-10-CM

## 2014-06-04 LAB — CBC WITH DIFFERENTIAL/PLATELET
Basophils Absolute: 0 10*3/uL (ref 0.0–0.1)
Basophils Relative: 0 % (ref 0–1)
EOS ABS: 0.3 10*3/uL (ref 0.0–0.7)
Eosinophils Relative: 6 % — ABNORMAL HIGH (ref 0–5)
HCT: 37.8 % (ref 36.0–46.0)
HEMOGLOBIN: 12.8 g/dL (ref 12.0–15.0)
LYMPHS ABS: 1.8 10*3/uL (ref 0.7–4.0)
Lymphocytes Relative: 38 % (ref 12–46)
MCH: 31.1 pg (ref 26.0–34.0)
MCHC: 33.9 g/dL (ref 30.0–36.0)
MCV: 92 fL (ref 78.0–100.0)
MONOS PCT: 10 % (ref 3–12)
Monocytes Absolute: 0.5 10*3/uL (ref 0.1–1.0)
NEUTROS ABS: 2.2 10*3/uL (ref 1.7–7.7)
Neutrophils Relative %: 46 % (ref 43–77)
Platelets: 179 10*3/uL (ref 150–400)
RBC: 4.11 MIL/uL (ref 3.87–5.11)
RDW: 14.9 % (ref 11.5–15.5)
WBC: 4.7 10*3/uL (ref 4.0–10.5)

## 2014-06-04 NOTE — Assessment & Plan Note (Signed)
Gastric bypass surgery January 2004.  Labs today: CBC diff, Vit B12, iron/TIBC, ferritin, and serum copper.  Defer management of Vit D deficiency to primary care provider.  Repeat same labs in 12 months.  Return in 12 months for follow-up.

## 2014-06-04 NOTE — Patient Instructions (Signed)
[place] at [facility]  Discharge Instructions:  We will continue with monthly B12 injections. Labs performed today are pending. We will call you with lab results on Monday or Tuesday. Continue follow-up with primary care provider as directed.  Return in 12 months for follow-up. _______________________________________________________________  Thank you for choosing [place] at [facility] to provide your oncology and hematology care.  To afford each patient quality time with our providers, please arrive at least 15 minutes before your scheduled appointment.  You need to re-schedule your appointment if you arrive 10 or more minutes late.  We strive to give you quality time with our providers, and arriving late affects you and other patients whose appointments are after yours.  Also, if you no show three or more times for appointments you may be dismissed from the clinic.  Again, thank you for choosing [place] at [place] hope is that these requests will allow you access to exceptional care and in a timely manner. _______________________________________________________________  If you have questions after your visit, please contact our office at [phone] between the hours of 8:30 a.m. and 5:00 p.m. Voicemails left after 4:30 p.m. will not be returned until the following business day. _______________________________________________________________  For prescription refill requests, have your pharmacy contact our office. _______________________________________________________________  Recommendations made by the consultant and any test results will be sent to your referring physician. _______________________________________________________________

## 2014-06-04 NOTE — Assessment & Plan Note (Signed)
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  Continue Vit B12 injections monthly.  Return in 12 months for follow-up.

## 2014-06-04 NOTE — Progress Notes (Signed)
Labs for cuser,ferr,b12,fol,cbcd,iron/tibc

## 2014-06-04 NOTE — Assessment & Plan Note (Signed)
On Vit D replacement.  Managed by primary care provider, Dr. Moshe Cipro.

## 2014-06-04 NOTE — Progress Notes (Signed)
Sheri Nakayama, MD [address], [address] [place] [address]  Vitamin B12 deficiency disease - Plan: CBC with Differential, Vitamin B12  Vitamin D deficiency  Gastric bypass status for obesity - Plan: CBC with Differential, Vitamin B12, Folate, Iron and TIBC, Ferritin, Copper, serum, Folate  CURRENT THERAPY: Vitamin B 12 injections 1000 mcg every 4 weeks.   INTERVAL HISTORY: Sheri Jones 69 y.o. 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   I personally reviewed and went over laboratory results with the patient.  The results are noted within this dictation.  I personally reviewed and went over radiographic studies with the patient.  The results are noted within this dictation.    Hematologically, she denies any complaints and ROS questioning is negative.  She notes that she has an upcoming appointment again with Dr. Moshe Cipro and an appointment with Dr. Benjamine Mola for her right ear.  Past Medical History  Diagnosis Date  . Allergic rhinitis due to pollen   . Osteoarthrosis, unspecified whether generalized or localized, lower leg   . Obesity, unspecified   . Unspecified essential hypertension   . Anemia   . Diabetes mellitus without complication   . 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 Vitamin B12 deficiency disease 10/28/2008    Qualifier: Diagnosis of  By: Moshe Cipro MD, Joycelyn Schmid      has Vitamin B12 deficiency disease; OBESITY; Essential hypertension; KNEE, ARTHRITIS, DEGEN./OSTEO; SPONDYLOLITHESIS; FATIGUE; PERSONAL HX COLONIC POLYPS; Abnormal facial hair; Prediabetes; Vitamin D deficiency; Routine general medical examination at a health care facility; Gastric bypass status for obesity; Osteopenia; Need for vaccination with 13-polyvalent pneumococcal conjugate vaccine; Medicare annual  wellness visit, subsequent; and Hearing loss of right ear on her problem list.     has No Known Allergies.  Ms. Ruffini does not currently have medications on file.  Past Surgical History  Procedure Laterality Date  . Gastric bypass  2004  . Colonoscopy  2008    Denies any headaches, dizziness, double vision, fevers, chills, night sweats, nausea, vomiting, diarrhea, constipation, chest pain, heart palpitations, shortness of breath, blood in stool, black tarry stool, urinary pain, urinary burning, urinary frequency, hematuria.   PHYSICAL EXAMINATION  ECOG PERFORMANCE STATUS: 0 - Asymptomatic  Filed Vitals:   06/04/14 1100  BP: 134/68  Pulse: 70  Temp: 97.9 F (36.6 C)  Resp: 20    GENERAL:alert, no distress, well nourished, well developed, comfortable, cooperative, obese and smiling SKIN: skin color, texture, turgor are normal, no rashes or significant lesions HEAD: Normocephalic, No masses, lesions, tenderness or abnormalities EYES: normal, PERRLA, EOMI, Conjunctiva are pink and non-injected EARS: External ears normal OROPHARYNX:lips, buccal mucosa, and tongue normal and mucous membranes are moist  NECK: supple, thyroid normal size, non-tender, without nodularity, no stridor, non-tender, trachea midline LYMPH:  no palpable lymphadenopathy BREAST:not examined LUNGS: clear to auscultation and percussion HEART: regular rate & rhythm, no murmurs, no gallops, S1 normal and S2 normal ABDOMEN:abdomen soft, non-tender, obese, normal bowel sounds, no masses or organomegaly and no hepatosplenomegaly BACK: Back symmetric, no curvature., No CVA tenderness EXTREMITIES:less then 2 second capillary refill, no joint deformities, effusion, or inflammation, no edema, no skin discoloration, no clubbing, no cyanosis  NEURO: alert & oriented x 3 with fluent speech, no focal motor/sensory deficits, gait normal   LABORATORY DATA:  CBC    Component Value Date/Time   WBC 5.1 06/05/2013 1049    RBC 4.16 06/05/2013 1049   HGB 13.4 06/05/2013 1049   HCT 38.4 06/05/2013 1049   PLT 167 06/05/2013 1049   MCV 92.3 06/05/2013 1049   MCH 32.2 06/05/2013 1049   MCHC 34.9 06/05/2013 1049   RDW 15.1 06/05/2013 1049   LYMPHSABS 1.8 06/05/2013 1049   MONOABS 0.5 06/05/2013 1049   EOSABS 0.2 06/05/2013 1049   BASOSABS 0.0 06/05/2013 1049      Chemistry      Component Value Date/Time   NA 141 03/30/2014 1254   K 3.9 03/30/2014 1254   CL 104 03/30/2014 1254   CO2 28 03/30/2014 1254   BUN 20 03/30/2014 1254   CREATININE 0.88 03/30/2014 1254   CREATININE 0.82 02/21/2010 2244      Component Value Date/Time   CALCIUM 8.8 03/30/2014 1254       RADIOGRAPHIC STUDIES:  11/23/2013  CLINICAL DATA: Screening.  EXAM: DIGITAL SCREENING BILATERAL MAMMOGRAM WITH CAD  COMPARISON: Previous exam(s)  ACR Breast Density Category a: The breast tissue is almost entirely fatty.  FINDINGS: There are no findings suspicious for malignancy. Images were processed with CAD.  IMPRESSION: No mammographic evidence of malignancy. A result letter of this screening mammogram will be mailed directly to the patient.  RECOMMENDATION: Screening mammogram in one year. (Code:[id number]-B-01Y)  BI-RADS CATEGORY 1: Negative.   Electronically Signed  By: Lajean Manes M.D.  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  Continue Vit B12 injections monthly.  Return in 12 months for follow-up.   Vitamin D deficiency On Vit D replacement.  Managed by primary care provider, Dr. Moshe Cipro.   Gastric bypass status for obesity Gastric bypass surgery January 2004.  Labs today: CBC diff, Vit B12, iron/TIBC, ferritin, and serum copper.  Defer management of Vit D deficiency to primary  care provider.  Repeat same labs in 12 months.  Return in 12 months for follow-up.   THERAPY PLAN:  We will continue to monitor blood counts and vitamin levels.  Continue monthly B12 injections.  Return in 12 months for annual follow-up and lab work.  All questions were answered. The patient knows to call the clinic with any problems, questions or concerns. We can certainly see the patient much sooner if necessary.  Patient and plan discussed with Dr. Ancil Linsey and she is in agreement with the aforementioned.   This note is electronically signed by: Robynn Pane 06/04/2014 11:51 AM

## 2014-06-05 LAB — FOLATE: Folate: >20 ng/mL

## 2014-06-05 LAB — IRON AND TIBC
IRON: 73 ug/dL (ref 42–145)
Saturation Ratios: 26 % (ref 20–55)
TIBC: 279 ug/dL (ref 250–470)
UIBC: 206 ug/dL (ref 125–400)

## 2014-06-05 LAB — VITAMIN B12: VITAMIN B 12: 1075 pg/mL — AB (ref 211–911)

## 2014-06-05 LAB — FERRITIN: Ferritin: 62 ng/mL (ref 10–291)

## 2014-06-08 ENCOUNTER — Telehealth (HOSPITAL_COMMUNITY): Payer: Self-pay | Admitting: Emergency Medicine

## 2014-06-08 LAB — COPPER, SERUM: COPPER: 133 ug/dL (ref 70–175)

## 2014-06-08 NOTE — Telephone Encounter (Signed)
-----   Message from [facility], PA-C sent at 06/08/2014  8:23 AM EST ----- Excellent!

## 2014-06-08 NOTE — Telephone Encounter (Signed)
Left message for pt that lab work was good

## 2014-06-21 ENCOUNTER — Encounter (HOSPITAL_BASED_OUTPATIENT_CLINIC_OR_DEPARTMENT_OTHER): Payer: Federal, State, Local not specified - PPO

## 2014-06-21 ENCOUNTER — Encounter (HOSPITAL_COMMUNITY): Payer: Self-pay

## 2014-06-21 DIAGNOSIS — E538 Deficiency of other specified B group vitamins: Secondary | ICD-10-CM

## 2014-06-21 MED ORDER — CYANOCOBALAMIN 1000 MCG/ML IJ SOLN
INTRAMUSCULAR | Status: AC
  Filled 2014-06-21: qty 1

## 2014-06-21 MED ORDER — CYANOCOBALAMIN 1000 MCG/ML IJ SOLN
1000.0000 ug | Freq: Once | INTRAMUSCULAR | Status: AC
  Administered 2014-06-21: 1000 ug via INTRAMUSCULAR

## 2014-06-21 NOTE — Progress Notes (Signed)
Sheri Jones's reason for visit today is for an injection  as scheduled per MD orders.   Sheri Jones also received vitamin b12 injection per MD orders; see [facility] for administration details.  Sheri Jones tolerated all procedures well and without incident; questions were answered and patient was discharged.

## 2014-06-21 NOTE — Patient Instructions (Signed)
[place] at [facility]  Discharge Instructions:  You had a b12 injection today.  Follow up as scheduled.  If you have any questions or concerns please call the clinic _______________________________________________________________  Thank you for choosing [place] at [facility] to provide your oncology and hematology care.  To afford each patient quality time with our providers, please arrive at least 15 minutes before your scheduled appointment.  You need to re-schedule your appointment if you arrive 10 or more minutes late.  We strive to give you quality time with our providers, and arriving late affects you and other patients whose appointments are after yours.  Also, if you no show three or more times for appointments you may be dismissed from the clinic.  Again, thank you for choosing [place] at [place] hope is that these requests will allow you access to exceptional care and in a timely manner. _______________________________________________________________  If you have questions after your visit, please contact our office at [phone] between the hours of 8:30 a.m. and 5:00 p.m. Voicemails left after 4:30 p.m. will not be returned until the following business day. _______________________________________________________________  For prescription refill requests, have your pharmacy contact our office. _______________________________________________________________  Recommendations made by the consultant and any test results will be sent to your referring physician. _______________________________________________________________

## 2014-07-05 ENCOUNTER — Other Ambulatory Visit: Payer: Self-pay | Admitting: Family Medicine

## 2014-07-06 ENCOUNTER — Ambulatory Visit (INDEPENDENT_AMBULATORY_CARE_PROVIDER_SITE_OTHER): Payer: Federal, State, Local not specified - PPO

## 2014-07-06 ENCOUNTER — Ambulatory Visit (INDEPENDENT_AMBULATORY_CARE_PROVIDER_SITE_OTHER): Payer: Federal, State, Local not specified - PPO | Admitting: Orthopedic Surgery

## 2014-07-06 VITALS — BP 120/77 | Ht 63.0 in | Wt 189.6 lb

## 2014-07-06 DIAGNOSIS — M1711 Unilateral primary osteoarthritis, right knee: Secondary | ICD-10-CM

## 2014-07-06 DIAGNOSIS — M25561 Pain in right knee: Secondary | ICD-10-CM

## 2014-07-06 NOTE — Progress Notes (Signed)
Follow-up visit  Chief Complaint  Patient presents with  . Follow-up    6 months follow up and xray Right knee    The patient resents for six-month x-ray right knee with history of severe valgus arthritis but minimal symptoms  Patient continues with minimal symptoms able to ambulate more than a mild did her exercises today not having any pain or dysfunction she does notice that she is more knock kneed  Review of systems negative  Physical exam shows valgus knee pseudo-laxity in the coronal plane knee flexion past 90 knee extension full neurovascular intact walks without assistive device general appearance normal oriented 3 mood pleasant  X-ray show no change from previous x-rays with severe valgus arthritis  Follow-up 6 months

## 2014-07-07 ENCOUNTER — Other Ambulatory Visit: Payer: Self-pay | Admitting: Family Medicine

## 2014-07-13 ENCOUNTER — Encounter (HOSPITAL_COMMUNITY): Payer: Federal, State, Local not specified - PPO | Attending: Oncology

## 2014-07-13 ENCOUNTER — Encounter (HOSPITAL_COMMUNITY): Payer: Self-pay

## 2014-07-13 DIAGNOSIS — E538 Deficiency of other specified B group vitamins: Secondary | ICD-10-CM | POA: Diagnosis not present

## 2014-07-13 MED ORDER — CYANOCOBALAMIN 1000 MCG/ML IJ SOLN
INTRAMUSCULAR | Status: AC
  Filled 2014-07-13: qty 1

## 2014-07-13 MED ORDER — CYANOCOBALAMIN 1000 MCG/ML IJ SOLN
1000.0000 ug | Freq: Once | INTRAMUSCULAR | Status: AC
  Administered 2014-07-13: 1000 ug via INTRAMUSCULAR

## 2014-07-13 NOTE — Progress Notes (Signed)
Sheri Jones presents today for injection per MD orders. B12 1017mcg administered SQ in left Gluteal. Administration without incident. Patient tolerated well.

## 2014-07-13 NOTE — Patient Instructions (Signed)
[place] at [facility] Discharge Instructions  RECOMMENDATIONS MADE BY THE CONSULTANT AND ANY TEST RESULTS WILL BE SENT TO YOUR REFERRING PHYSICIAN.  You received your B12 shot today. Call for any concerns or questions.  Will see you at your next appt.   Thank you for choosing [place] at [facility] to provide your oncology and hematology care.  To afford each patient quality time with our provider, please arrive at least 15 minutes before your scheduled appointment time.    You need to re-schedule your appointment should you arrive 10 or more minutes late.  We strive to give you quality time with our providers, and arriving late affects you and other patients whose appointments are after yours.  Also, if you no show three or more times for appointments you may be dismissed from the clinic at the providers discretion.     Again, thank you for choosing [facility].  Our hope is that these requests will decrease the amount of time that you wait before being seen by our physicians.       _____________________________________________________________  Should you have questions after your visit to [facility], please contact our office at [phone] between the hours of 8:30 a.m. and 4:30 p.m.  Voicemails left after 4:30 p.m. will not be returned until the following business day.  For prescription refill requests, have your pharmacy contact our office.

## 2014-07-21 ENCOUNTER — Other Ambulatory Visit: Payer: Self-pay | Admitting: Family Medicine

## 2014-07-22 LAB — CBC WITH DIFFERENTIAL/PLATELET
BASOS ABS: 0 10*3/uL (ref 0.0–0.1)
BASOS PCT: 1 % (ref 0–1)
EOS PCT: 6 % — AB (ref 0–5)
Eosinophils Absolute: 0.3 10*3/uL (ref 0.0–0.7)
HCT: 40.1 % (ref 36.0–46.0)
Hemoglobin: 13.2 g/dL (ref 12.0–15.0)
LYMPHS ABS: 1.8 10*3/uL (ref 0.7–4.0)
LYMPHS PCT: 42 % (ref 12–46)
MCH: 30.5 pg (ref 26.0–34.0)
MCHC: 32.9 g/dL (ref 30.0–36.0)
MCV: 92.6 fL (ref 78.0–100.0)
MPV: 10.4 fL (ref 9.4–12.4)
Monocytes Absolute: 0.4 10*3/uL (ref 0.1–1.0)
Monocytes Relative: 10 % (ref 3–12)
NEUTROS ABS: 1.8 10*3/uL (ref 1.7–7.7)
Neutrophils Relative %: 41 % — ABNORMAL LOW (ref 43–77)
PLATELETS: 179 10*3/uL (ref 150–400)
RBC: 4.33 MIL/uL (ref 3.87–5.11)
RDW: 14.7 % (ref 11.5–15.5)
WBC: 4.3 10*3/uL (ref 4.0–10.5)

## 2014-07-23 LAB — COMPREHENSIVE METABOLIC PANEL
ALBUMIN: 3.9 g/dL (ref 3.5–5.2)
ALT: 22 U/L (ref 0–35)
AST: 24 U/L (ref 0–37)
Alkaline Phosphatase: 60 U/L (ref 39–117)
BUN: 22 mg/dL (ref 6–23)
CALCIUM: 9.2 mg/dL (ref 8.4–10.5)
CHLORIDE: 104 meq/L (ref 96–112)
CO2: 30 meq/L (ref 19–32)
Creat: 0.85 mg/dL (ref 0.50–1.10)
Glucose, Bld: 85 mg/dL (ref 70–99)
Potassium: 3.8 mEq/L (ref 3.5–5.3)
SODIUM: 143 meq/L (ref 135–145)
TOTAL PROTEIN: 6.4 g/dL (ref 6.0–8.3)
Total Bilirubin: 0.6 mg/dL (ref 0.2–1.2)

## 2014-07-23 LAB — LIPID PANEL
Cholesterol: 185 mg/dL (ref 0–200)
HDL: 76 mg/dL (ref >=46)
LDL Cholesterol: 97 mg/dL (ref 0–99)
Total CHOL/HDL Ratio: 2.4 Ratio
Triglycerides: 60 mg/dL (ref <150)
VLDL: 12 mg/dL (ref 0–40)

## 2014-07-23 LAB — HEMOGLOBIN A1C
Hgb A1c MFr Bld: 5.8 % — ABNORMAL HIGH (ref <5.7)
MEAN PLASMA GLUCOSE: 120 mg/dL — AB (ref <117)

## 2014-07-23 LAB — TSH: TSH: 1.449 u[IU]/mL (ref 0.350–4.500)

## 2014-07-29 ENCOUNTER — Ambulatory Visit (INDEPENDENT_AMBULATORY_CARE_PROVIDER_SITE_OTHER): Payer: Federal, State, Local not specified - PPO | Admitting: Family Medicine

## 2014-07-29 ENCOUNTER — Encounter: Payer: Self-pay | Admitting: Family Medicine

## 2014-07-29 VITALS — BP 120/82 | HR 75 | Resp 15 | Ht 63.0 in | Wt 192.8 lb

## 2014-07-29 DIAGNOSIS — H6123 Impacted cerumen, bilateral: Secondary | ICD-10-CM | POA: Insufficient documentation

## 2014-07-29 DIAGNOSIS — R7303 Prediabetes: Secondary | ICD-10-CM

## 2014-07-29 DIAGNOSIS — Z0000 Encounter for general adult medical examination without abnormal findings: Secondary | ICD-10-CM | POA: Insufficient documentation

## 2014-07-29 DIAGNOSIS — R7309 Other abnormal glucose: Secondary | ICD-10-CM

## 2014-07-29 DIAGNOSIS — E669 Obesity, unspecified: Secondary | ICD-10-CM

## 2014-07-29 MED ORDER — VITAMIN D (ERGOCALCIFEROL) 1.25 MG (50000 UNIT) PO CAPS: 50000.0000 [IU] | ORAL_CAPSULE | ORAL | Status: DC

## 2014-07-29 MED ORDER — PHENTERMINE HCL 37.5 MG PO TBDP: 1.0000 | ORAL_TABLET | Freq: Every day | ORAL | Status: DC

## 2014-07-29 MED ORDER — POTASSIUM CHLORIDE ER 10 MEQ PO TBCR: 10.0000 meq | EXTENDED_RELEASE_TABLET | Freq: Two times a day (BID) | ORAL | Status: DC

## 2014-07-29 NOTE — Progress Notes (Signed)
   Subjective:    Patient ID: Sheri Jones, female    DOB: 25-Apr-1945, 69 y.o.   MRN: [id number]  HPI Patient is in for annual Pelvic and breast. No other health concerns are expressed or addressed at the visit. Recent labs, if available are reviewed. Immunization is reviewed , and  updated if needed.    Review of Systems See HPI     Objective:   Physical Exam BP 120/82 mmHg  Pulse 75  Resp 15  Ht 5\' 3"  (1.6 m)  Wt 192 lb 12.8 oz (87.454 kg)  BMI 34.16 kg/m2  SpO2 99%  Pleasant obese female, alert and oriented x 3, in no cardio-pulmonary distress. Afebrile.  Breast: No asymetry,no masses or lumps. No tenderness. No nipple discharge or inversion. No axillary or supraclavicular adenopathy   Rectal:  Normal sphincter tone. No mass.No rectal masses.  Guaiac negative stool.  GU: External genitalia normal female genitalia , female distribution of hair. No lesions. Urethral meatus normal in size, no  Prolapse, no lesions visibly  Present. Bladder non tender. Vagina pink and moist , with no visible lesions , discharge present . Adequate pelvic support no  cystocele or rectocele noted Cervix pink and appears healthy, no lesions or ulcerations noted, no discharge noted from os Uterus normal size, no adnexal masses, no cervical motion or adnexal tenderness.           Assessment & Plan:  Routine general medical examination at a health care facility     Annual physical exam Annual exam as documented. Counseling done  re healthy lifestyle involving commitment to 150 minutes exercise per week, heart healthy diet, and attaining healthy weight.The importance of adequate sleep also discussed. Regular seat belt use and home safety, is also discussed. Changes in health habits are decided on by the patient with goals and time frames  set for achieving them. Immunization and cancer screening needs are specifically addressed at this  visit.    Obesity Deteriorated. Patient re-educated about  the importance of commitment to a  minimum of 150 minutes of exercise per week.  The importance of healthy food choices with portion control discussed. Encouraged to start a food diary, count calories and to consider  joining a support group. Sample diet sheets offered. Goals set by the patient for the next several months.   Wt Readings from Last 3 Encounters:  07/29/14 192 lb 12.8 oz (87.454 kg)  07/06/14 189 lb 9.6 oz (86.002 kg)  06/04/14 189 lb 9.6 oz (86.002 kg)    Body mass index is 34.16 kg/(m^2).  Current exercise per week 240 minutes.    Prediabetes Unchanged since 03/2014 Patient educated about the importance of limiting  Carbohydrate intake , the need to commit to daily physical activity for a minimum of 30 minutes , and to commit weight loss. The fact that changes in all these areas will reduce or eliminate all together the development of diabetes is stressed.   Updated lab needed at/ before next visit.    Bilateral impacted cerumen Bilateral ear flush by nursing staff, atraumatic and successful

## 2014-07-29 NOTE — Assessment & Plan Note (Signed)
Deteriorated. Patient re-educated about  the importance of commitment to a  minimum of 150 minutes of exercise per week.  The importance of healthy food choices with portion control discussed. Encouraged to start a food diary, count calories and to consider  joining a support group. Sample diet sheets offered. Goals set by the patient for the next several months.   Wt Readings from Last 3 Encounters:  07/29/14 192 lb 12.8 oz (87.454 kg)  07/06/14 189 lb 9.6 oz (86.002 kg)  06/04/14 189 lb 9.6 oz (86.002 kg)    Body mass index is 34.16 kg/(m^2).  Current exercise per week 240 minutes.

## 2014-07-29 NOTE — Assessment & Plan Note (Signed)

## 2014-07-29 NOTE — Assessment & Plan Note (Addendum)
Bilateral ear flush by nursing staff, atraumatic and successful

## 2014-07-29 NOTE — Assessment & Plan Note (Signed)
Unchanged since 03/2014 Patient educated about the importance of limiting  Carbohydrate intake , the need to commit to daily physical activity for a minimum of 30 minutes , and to commit weight loss. The fact that changes in all these areas will reduce or eliminate all together the development of diabetes is stressed.   Updated lab needed at/ before next visit.

## 2014-07-29 NOTE — Patient Instructions (Addendum)
F/u in  4 month, call if you need me before  Pls reduce potatoes and pasta and commit to  60 mins every day of exercise  Weight loss goal of 6 pounds in the next 4 months  Continue half phentermine daily  HBa1C and chem 7 and EGFR in 4 months  Bilateral ear flush today

## 2014-08-17 ENCOUNTER — Encounter (HOSPITAL_COMMUNITY): Payer: Self-pay

## 2014-08-17 ENCOUNTER — Encounter (HOSPITAL_COMMUNITY): Payer: Federal, State, Local not specified - PPO | Attending: Hematology & Oncology

## 2014-08-17 DIAGNOSIS — E538 Deficiency of other specified B group vitamins: Secondary | ICD-10-CM

## 2014-08-17 MED ORDER — CYANOCOBALAMIN 1000 MCG/ML IJ SOLN
1000.0000 ug | Freq: Once | INTRAMUSCULAR | Status: AC
  Administered 2014-08-17: 1000 ug via INTRAMUSCULAR

## 2014-08-17 NOTE — Progress Notes (Signed)
Sheri Jones's reason for visit today is for an injection as scheduled per MD orders.Sheri Jones also received vitamin b12 injection per MD orders; see [facility] for administration details.  Sheri Jones tolerated all procedures well and without incident; questions were answered and patient was discharged.

## 2014-08-17 NOTE — Patient Instructions (Signed)
[place] at [facility]  Discharge Instructions:  You had a vitamin b12 injection today  Please follow up as scheduled Call the clinic if you have any questions or concerns _______________________________________________________________  Thank you for choosing [place] at [facility] to provide your oncology and hematology care.  To afford each patient quality time with our providers, please arrive at least 15 minutes before your scheduled appointment.  You need to re-schedule your appointment if you arrive 10 or more minutes late.  We strive to give you quality time with our providers, and arriving late affects you and other patients whose appointments are after yours.  Also, if you no show three or more times for appointments you may be dismissed from the clinic.  Again, thank you for choosing [place] [facility] [place] hope is that these requests will allow you access to exceptional care and in a timely manner. _______________________________________________________________  If you have questions after your visit, please contact our office at [phone] between the hours of 8:30 a.m. and 5:00 p.m. Voicemails left after 4:30 p.m. will not be returned until the following business day. _______________________________________________________________  For prescription refill requests, have your pharmacy contact our office. _______________________________________________________________  Recommendations made by the consultant and any test results will be sent to your referring physician. _______________________________________________________________

## 2014-09-09 ENCOUNTER — Other Ambulatory Visit: Payer: Self-pay | Admitting: Family Medicine

## 2014-09-14 ENCOUNTER — Encounter (HOSPITAL_COMMUNITY): Payer: Medicare Other

## 2014-09-16 ENCOUNTER — Encounter (HOSPITAL_COMMUNITY): Payer: Self-pay

## 2014-09-16 ENCOUNTER — Encounter (HOSPITAL_COMMUNITY): Payer: Federal, State, Local not specified - PPO | Attending: Oncology

## 2014-09-16 VITALS — BP 109/77 | HR 88 | Temp 97.6°F | Resp 18

## 2014-09-16 DIAGNOSIS — E538 Deficiency of other specified B group vitamins: Secondary | ICD-10-CM | POA: Diagnosis not present

## 2014-09-16 MED ORDER — CYANOCOBALAMIN 1000 MCG/ML IJ SOLN
1000.0000 ug | Freq: Once | INTRAMUSCULAR | Status: AC
  Administered 2014-09-16: 1000 ug via INTRAMUSCULAR

## 2014-09-16 NOTE — Patient Instructions (Signed)
[facility] at [facility] Discharge Instructions  RECOMMENDATIONS MADE BY THE CONSULTANT AND ANY TEST RESULTS WILL BE SENT TO YOUR REFERRING PHYSICIAN.  Vitamin b12 injection today Follow up as scheduled Please call the clinic if you have any questions or concerns  Thank you for choosing [facility] at [facility] to provide your oncology and hematology care.  To afford each patient quality time with our provider, please arrive at least 15 minutes before your scheduled appointment time.    You need to re-schedule your appointment should you arrive 10 or more minutes late.  We strive to give you quality time with our providers, and arriving late affects you and other patients whose appointments are after yours.  Also, if you no show three or more times for appointments you may be dismissed from the clinic at the providers discretion.     Again, thank you for choosing [facility].  Our hope is that these requests will decrease the amount of time that you wait before being seen by our physicians.       _____________________________________________________________  Should you have questions after your visit to [facility], please contact our office at [phone] between the hours of 8:30 a.m. and 4:30 p.m.  Voicemails left after 4:30 p.m. will not be returned until the following business day.  For prescription refill requests, have your pharmacy contact our office.     

## 2014-09-16 NOTE — Progress Notes (Signed)
Sheri Jones's reason for visit today is for an injection as scheduled per MD orders.     Sheri Jones also received Vitamin b12 injection  per MD orders; see [facility] for administration details.  Sheri Jones tolerated all procedures well and without incident; questions were answered and patient was discharged.

## 2014-10-19 ENCOUNTER — Encounter: Payer: Self-pay | Admitting: Family Medicine

## 2014-10-19 ENCOUNTER — Ambulatory Visit (HOSPITAL_COMMUNITY)
Admission: RE | Admit: 2014-10-19 | Discharge: 2014-10-19 | Disposition: A | Discharge status: Home / Self Care | Payer: Federal, State, Local not specified - PPO | Source: Ambulatory Visit | Attending: Family Medicine | Admitting: Family Medicine

## 2014-10-19 ENCOUNTER — Encounter (HOSPITAL_COMMUNITY): Payer: Federal, State, Local not specified - PPO

## 2014-10-19 ENCOUNTER — Ambulatory Visit (INDEPENDENT_AMBULATORY_CARE_PROVIDER_SITE_OTHER): Payer: Federal, State, Local not specified - PPO | Admitting: Family Medicine

## 2014-10-19 ENCOUNTER — Encounter (HOSPITAL_COMMUNITY): Payer: Medicare Other | Attending: Hematology & Oncology

## 2014-10-19 VITALS — BP 117/76 | HR 81 | Temp 98.2°F

## 2014-10-19 VITALS — BP 110/78 | HR 86 | Resp 16 | Ht 63.0 in | Wt 189.1 lb

## 2014-10-19 DIAGNOSIS — E669 Obesity, unspecified: Secondary | ICD-10-CM | POA: Diagnosis not present

## 2014-10-19 DIAGNOSIS — I10 Essential (primary) hypertension: Secondary | ICD-10-CM

## 2014-10-19 DIAGNOSIS — Z87891 Personal history of nicotine dependence: Secondary | ICD-10-CM | POA: Diagnosis not present

## 2014-10-19 DIAGNOSIS — R1032 Left lower quadrant pain: Secondary | ICD-10-CM | POA: Insufficient documentation

## 2014-10-19 DIAGNOSIS — R1012 Left upper quadrant pain: Secondary | ICD-10-CM

## 2014-10-19 DIAGNOSIS — E538 Deficiency of other specified B group vitamins: Secondary | ICD-10-CM

## 2014-10-19 LAB — POC HEMOCCULT BLD/STL (OFFICE/1-CARD/DIAGNOSTIC): Fecal Occult Blood, POC: NEGATIVE

## 2014-10-19 MED ORDER — CYANOCOBALAMIN 1000 MCG/ML IJ SOLN
1000.0000 ug | Freq: Once | INTRAMUSCULAR | Status: AC
  Administered 2014-10-19: 1000 ug via INTRAMUSCULAR
  Filled 2014-10-19: qty 1

## 2014-10-19 NOTE — Patient Instructions (Signed)
F/u as before, call if you need me sooner  Abdominal X ray today  elDiverticulitis Diverticulitis is when small pockets that have formed in your colon (large intestine) become infected or swollen. HOME CARE  Follow your doctor's instructions.  Follow a special diet if told by your doctor.  When you feel better, your doctor may tell you to change your diet. You may be told to eat a lot of fiber. Fruits and vegetables are good sources of fiber. Fiber makes it easier to poop (have bowel movements).  Take supplements or probiotics as told by your doctor.  Only take medicines as told by your doctor.  Keep all follow-up visits with your doctor. GET HELP IF:  Your pain does not get better.  You have a hard time eating food.  You are not pooping like normal. GET HELP RIGHT AWAY IF:  Your pain gets worse.  Your problems do not get better.  Your problems suddenly get worse.  You have a fever.  You keep throwing up (vomiting).  You have bloody or black, tarry poop (stool). MAKE SURE YOU:   Understand these instructions.  Will watch your condition.  Will get help right away if you are not doing well or get worse. Document Released: 09/26/2007 Document Revised: 04/14/2013 Document Reviewed: 03/04/2013 [facility] Patient Information 2015 [place], [place]. This information is not intended to replace advice given to you by your health care provider. Make sure you discuss any questions you have with your health care provider. ieve pain is from possible flare of diverticulosis, follow liquid diet , gradually atarting solids as able over the next 2 to 3 days  Rectal exam today

## 2014-10-19 NOTE — Progress Notes (Signed)
Sheri Jones presents today for injection per MD orders. B12 1059mcg administered SQ in right Lower Back. Administration without incident. Patient tolerated well.

## 2014-10-19 NOTE — Progress Notes (Signed)
   Subjective:    Patient ID: Sheri Jones, female    DOB: May 28, 1945, 69 y.o.   MRN: [id number]  HPI 3 days ago pt had cramping lUQ pain drank a lot of vinegar and ever since then she has had intermittent crampiong pains in lower abdomen no change in BM, no blood or mucus in stool, thinks that she has diverticulosis Denies fever , chills or urinary symptoms Prior to this has been well  Review of Systems See HPI Denies recent fever or chills. Denies sinus pressure, nasal congestion, ear pain or sore throat. Denies chest congestion, productive cough or wheezing. Denies chest pains, palpitations and leg swelling  Chronic  joint pain, swelling sometimes causing  limitation in mobility. Denies headaches, seizures, numbness, or tingling. Denies depression, anxiety or insomnia. Denies skin break down or rash.        Objective:   Physical Exam BP 110/78 mmHg  Pulse 86  Resp 16  Ht 5\' 3"  (1.6 m)  Wt 189 lb 1.9 oz (85.784 kg)  BMI 33.51 kg/m2  SpO2 99% Patient alert and oriented and in no cardiopulmonary distress.  HEENT: No facial asymmetry, EOMI,   oropharynx pink and moist.  Neck supple no JVD, no mass.  Chest: Clear to auscultation bilaterally.  CVS: S1, S2 no murmurs, no S3.Regular rate.  ABD: Soft , left lower quadrant tenderness, no guarding or rebound, bowel sounds normal, heme negative stool on exam  Ext: No edema  MS: Adequate ROM spine, shoulders, hips and knees.  Skin: Intact, no ulcerations or rash noted.  Psych: Good eye contact, normal affect. Memory intact not anxious or depressed appearing.  CNS: CN 2-12 intact, power,  normal throughout.no focal deficits noted.        Assessment & Plan:  Abdominal pain, left lower quadrant Reports h/o diverticulosis , not certain of this however, , abdominal exam negative for acute abdomen, , advised gradually increasing from liquid to solid diet. Abdominal xray to ensure no ileus or perf Call back if persists  or worsens Heme negative stool on exam  Essential hypertension Controlled, no change in medication DASH diet and commitment to daily physical activity for a minimum of 30 minutes discussed and encouraged, as a part of hypertension management. The importance of attaining a healthy weight is also discussed.  BP/Weight 10/19/2014 10/19/2014 09/16/2014 07/29/2014 07/13/2014 07/06/2014 5/70/1779  Systolic BP 390 300 923 300 762 263 335  Diastolic BP 78 76 77 82 65 77 85  Wt. (Lbs) 189.12 - - 192.8 - 189.6 -  BMI 33.51 - - 34.16 - 33.59 -        Obesity Deteriorated. Patient re-educated about  the importance of commitment to a  minimum of 150 minutes of exercise per week.  The importance of healthy food choices with portion control discussed. Encouraged to start a food diary, count calories and to consider  joining a support group. Sample diet sheets offered. Goals set by the patient for the next several months.   Weight /BMI 10/19/2014 07/29/2014 07/06/2014  WEIGHT 189 lb 1.9 oz 192 lb 12.8 oz 189 lb 9.6 oz  HEIGHT 5\' 3"  5\' 3"  5\' 3"   BMI 33.51 kg/m2 34.16 kg/m2 33.59 kg/m2    Current exercise per week 120 minutes.

## 2014-10-22 NOTE — Assessment & Plan Note (Signed)
Controlled, no change in medication DASH diet and commitment to daily physical activity for a minimum of 30 minutes discussed and encouraged, as a part of hypertension management. The importance of attaining a healthy weight is also discussed.  BP/Weight 10/19/2014 10/19/2014 09/16/2014 07/29/2014 07/13/2014 07/06/2014 4/56/2563  Systolic BP 893 734 287 681 157 262 035  Diastolic BP 78 76 77 82 65 77 85  Wt. (Lbs) 189.12 - - 192.8 - 189.6 -  BMI 33.51 - - 34.16 - 33.59 -

## 2014-10-22 NOTE — Assessment & Plan Note (Signed)
Deteriorated. Patient re-educated about  the importance of commitment to a  minimum of 150 minutes of exercise per week.  The importance of healthy food choices with portion control discussed. Encouraged to start a food diary, count calories and to consider  joining a support group. Sample diet sheets offered. Goals set by the patient for the next several months.   Weight /BMI 10/19/2014 07/29/2014 07/06/2014  WEIGHT 189 lb 1.9 oz 192 lb 12.8 oz 189 lb 9.6 oz  HEIGHT 5\' 3"  5\' 3"  5\' 3"   BMI 33.51 kg/m2 34.16 kg/m2 33.59 kg/m2    Current exercise per week 120 minutes.

## 2014-10-22 NOTE — Assessment & Plan Note (Addendum)
Reports h/o diverticulosis , not certain of this however, , abdominal exam negative for acute abdomen, , advised gradually increasing from liquid to solid diet. Abdominal xray to ensure no ileus or perf Call back if persists or worsens Heme negative stool on exam

## 2014-11-16 ENCOUNTER — Encounter (HOSPITAL_COMMUNITY): Payer: Self-pay

## 2014-11-16 ENCOUNTER — Encounter (HOSPITAL_COMMUNITY): Payer: Federal, State, Local not specified - PPO | Attending: Hematology & Oncology

## 2014-11-16 VITALS — BP 125/71 | HR 82 | Temp 98.1°F | Resp 18

## 2014-11-16 DIAGNOSIS — E538 Deficiency of other specified B group vitamins: Secondary | ICD-10-CM | POA: Diagnosis not present

## 2014-11-16 MED ORDER — CYANOCOBALAMIN 1000 MCG/ML IJ SOLN
1000.0000 ug | Freq: Once | INTRAMUSCULAR | Status: AC
  Administered 2014-11-16: 1000 ug via INTRAMUSCULAR

## 2014-11-16 NOTE — Progress Notes (Signed)
Sheri Jones presents today for injection per the provider's orders.  B12 administration without incident; see MAR for injection details.  Patient tolerated procedure well and without incident.  No questions or complaints noted at this time.

## 2014-11-16 NOTE — Patient Instructions (Signed)
[place] at [facility] Discharge Instructions  RECOMMENDATIONS MADE BY THE CONSULTANT AND ANY TEST RESULTS WILL BE SENT TO YOUR REFERRING PHYSICIAN.  B12 injection today. Return as scheduled for injections.  Thank you for choosing [place] at [facility] to provide your oncology and hematology care.  To afford each patient quality time with our provider, please arrive at least 15 minutes before your scheduled appointment time.    You need to re-schedule your appointment should you arrive 10 or more minutes late.  We strive to give you quality time with our providers, and arriving late affects you and other patients whose appointments are after yours.  Also, if you no show three or more times for appointments you may be dismissed from the clinic at the providers discretion.     Again, thank you for choosing [facility].  Our hope is that these requests will decrease the amount of time that you wait before being seen by our physicians.       _____________________________________________________________  Should you have questions after your visit to [facility], please contact our office at [phone] between the hours of 8:30 a.m. and 4:30 p.m.  Voicemails left after 4:30 p.m. will not be returned until the following business day.  For prescription refill requests, have your pharmacy contact our office.

## 2014-11-23 ENCOUNTER — Other Ambulatory Visit: Payer: Self-pay | Admitting: Family Medicine

## 2014-11-23 DIAGNOSIS — Z1231 Encounter for screening mammogram for malignant neoplasm of breast: Secondary | ICD-10-CM

## 2014-11-24 LAB — HEMOGLOBIN A1C
Hgb A1c MFr Bld: 5.7 % — ABNORMAL HIGH (ref <5.7)
MEAN PLASMA GLUCOSE: 117 mg/dL — AB (ref <117)

## 2014-11-29 ENCOUNTER — Ambulatory Visit (HOSPITAL_COMMUNITY): Payer: Federal, State, Local not specified - PPO

## 2014-11-29 ENCOUNTER — Ambulatory Visit (INDEPENDENT_AMBULATORY_CARE_PROVIDER_SITE_OTHER): Payer: Federal, State, Local not specified - PPO | Admitting: Family Medicine

## 2014-11-29 ENCOUNTER — Encounter: Payer: Self-pay | Admitting: Family Medicine

## 2014-11-29 VITALS — BP 126/74 | HR 86 | Resp 18 | Ht 63.0 in | Wt 188.0 lb

## 2014-11-29 DIAGNOSIS — L987 Excessive and redundant skin and subcutaneous tissue: Secondary | ICD-10-CM | POA: Insufficient documentation

## 2014-11-29 DIAGNOSIS — M25561 Pain in right knee: Secondary | ICD-10-CM

## 2014-11-29 DIAGNOSIS — R7303 Prediabetes: Secondary | ICD-10-CM

## 2014-11-29 DIAGNOSIS — E559 Vitamin D deficiency, unspecified: Secondary | ICD-10-CM | POA: Diagnosis not present

## 2014-11-29 DIAGNOSIS — Z1159 Encounter for screening for other viral diseases: Secondary | ICD-10-CM

## 2014-11-29 DIAGNOSIS — I10 Essential (primary) hypertension: Secondary | ICD-10-CM

## 2014-11-29 DIAGNOSIS — R7309 Other abnormal glucose: Secondary | ICD-10-CM

## 2014-11-29 DIAGNOSIS — E538 Deficiency of other specified B group vitamins: Secondary | ICD-10-CM

## 2014-11-29 DIAGNOSIS — E669 Obesity, unspecified: Secondary | ICD-10-CM

## 2014-11-29 DIAGNOSIS — L918 Other hypertrophic disorders of the skin: Secondary | ICD-10-CM | POA: Diagnosis not present

## 2014-11-29 MED ORDER — TRAMADOL HCL 50 MG PO TABS: ORAL_TABLET | ORAL | Status: DC

## 2014-11-29 MED ORDER — METHYLPREDNISOLONE ACETATE 80 MG/ML IJ SUSP
80.0000 mg | Freq: Once | INTRAMUSCULAR | Status: AC
  Administered 2014-11-29: 80 mg via INTRAMUSCULAR

## 2014-11-29 MED ORDER — KETOROLAC TROMETHAMINE 60 MG/2ML IM SOLN
60.0000 mg | Freq: Once | INTRAMUSCULAR | Status: AC
  Administered 2014-11-29: 60 mg via INTRAMUSCULAR

## 2014-11-29 NOTE — Assessment & Plan Note (Signed)
Controlled, no change in medication DASH diet and commitment to daily physical activity for a minimum of 30 minutes discussed and encouraged, as a part of hypertension management. The importance of attaining a healthy weight is also discussed.  BP/Weight 11/29/2014 11/16/2014 10/19/2014 10/19/2014 09/16/2014 07/29/2014 1/95/9747  Systolic BP 185 501 586 825 749 355 217  Diastolic BP 74 71 78 76 77 82 65  Wt. (Lbs) 188.04 - 189.12 - - 192.8 -  BMI 33.32 - 33.51 - - 34.16 -

## 2014-11-29 NOTE — Assessment & Plan Note (Signed)
Uncontrolled.Toradol and depo medrol administered IM in the office , pt top start tramadol at bed time

## 2014-11-29 NOTE — Patient Instructions (Signed)
Annual wellness in January 2nd week , call for flu vaccine end August, call if you need me before  STOP phentermine  Injections in office for right knee  pain and new additional med at bed time, tramadol ONE OR TWO TABLETS  Non fast chem 7 Vit D and hepatitis screen in January  You are referred to plastic surgeon for evaluation per your request  Please work on good  health habits so that your health will improve. 1. Commitment to daily physical activity for 30 to 60  minutes, if you are able to do this.  2. Commitment to wise food choices. Aim for half of your  food intake to be vegetable and fruit, one quarter starchy foods, and one quarter protein. Try to eat on a regular schedule  3 meals per day, snacking between meals should be limited to vegetables or fruits or small portions of nuts. 64 ounces of water per day is generally recommended, unless you have specific health conditions, like heart failure or kidney failure where you will need to limit fluid intake.  3. Commitment to sufficient and a  good quality of physical and mental rest daily, generally between 6 to 8 hours per day.  WITH PERSISTANCE AND PERSEVERANCE, THE IMPOSSIBLE , BECOMES THE NORM! Thanks for choosing [facility], we consider it a privelige to serve you.

## 2014-11-30 NOTE — Assessment & Plan Note (Signed)
Gets monthly B12 at specialty clinic

## 2014-11-30 NOTE — Progress Notes (Signed)
Sheri Jones     MRN: [id number]      DOB: Jun 19, 1945   HPI Sheri Jones is here for follow up and re-evaluation of chronic medical conditions, medication management and review of any available recent lab and radiology data.  Preventive health is updated, specifically  Cancer screening and Immunization.   Questions or concerns regarding consultations or procedures which the PT has had in the interim are  addressed. The PT denies any adverse reactions to current medications since the last visit.  Increased right knee pain and instability, thinks she may need surgery, no falls. Disturbs her sleep Wants to be referred to plastic surgeon for evaluation re excess loose skin under her arms following weight loss,,states that despite muscle toning and building unable to change this Still commited to exercise and healthy diet, weight essentially stable x 1 year , will stop phentermine  ROS Denies recent fever or chills. Denies sinus pressure, nasal congestion, ear pain or sore throat. Denies chest congestion, productive cough or wheezing. Denies chest pains, palpitations and leg swelling Denies abdominal pain, nausea, vomiting,diarrhea or constipation.   Denies dysuria, frequency, hesitancy or incontinence.  Denies headaches, seizures, numbness, or tingling. Denies depression, anxiety or insomnia. Denies skin break down or rash.   PE  BP 126/74 mmHg  Pulse 86  Resp 18  Ht 5\' 3"  (1.6 m)  Wt 188 lb 0.6 oz (85.294 kg)  BMI 33.32 kg/m2  SpO2 98%  Patient alert and oriented and in no cardiopulmonary distress.  HEENT: No facial asymmetry, EOMI,   oropharynx pink and moist.  Neck supple no JVD, no mass.  Chest: Clear to auscultation bilaterally.  CVS: S1, S2 no murmurs, no S3.Regular rate.  ABD: Soft non tender.   Ext: No edema  MS: Adequate ROM spine, shoulders, hips and markedly reduced in right  Knee which is swollen, deformed and tender with crepitus  Skin: Intact, no  ulcerations or rash noted.  Psych: Good eye contact, normal affect. Memory intact not anxious or depressed appearing.  CNS: CN 2-12 intact, power,  normal throughout.no focal deficits noted.   Assessment & Plan   Essential hypertension Controlled, no change in medication DASH diet and commitment to daily physical activity for a minimum of 30 minutes discussed and encouraged, as a part of hypertension management. The importance of attaining a healthy weight is also discussed.  BP/Weight 11/29/2014 11/16/2014 10/19/2014 10/19/2014 09/16/2014 07/29/2014 0/12/3816  Systolic BP 299 371 696 789 381 017 510  Diastolic BP 74 71 78 76 77 82 65  Wt. (Lbs) 188.04 - 189.12 - - 192.8 -  BMI 33.32 - 33.51 - - 34.16 -        Knee pain, right Uncontrolled.Toradol and depo medrol administered IM in the office , pt top start tramadol at bed time   Excess skin of arm Excess skin under both arms following significant weight loss, despite muscle strengthening  Exercises, pt  Unable to firm and tighten, this concerns her, she requests referral to Plastic surgeon to see if surgical help is affordable, referral entered  Obesity Unchanged, stop phentermine Patient re-educated about  the importance of commitment to a  minimum of 150 minutes of exercise per week.  The importance of healthy food choices with portion control discussed. Encouraged to start a food diary, count calories and to consider  joining a support group. Sample diet sheets offered. Goals set by the patient for the next several months.   Weight /BMI 11/29/2014 10/19/2014 07/29/2014  WEIGHT 188 lb 0.6 oz 189 lb 1.9 oz 192 lb 12.8 oz  HEIGHT 5\' 3"  5\' 3"  5\' 3"   BMI 33.32 kg/m2 33.51 kg/m2 34.16 kg/m2    Current exercise per week 150  minutes.   Prediabetes Patient educated about the importance of limiting  Carbohydrate intake , the need to commit to daily physical activity for a minimum of 30 minutes , and to commit weight loss. The fact  that changes in all these areas will reduce or eliminate all together the development of diabetes is stressed.  Improved  Diabetic Labs Latest Ref Rng 11/23/2014 07/22/2014 03/30/2014 07/22/2013 03/16/2013  HbA1c <5.7 % 5.7(H) 5.8(H) 5.8(H) 5.7(H) 5.7(H)  Chol 0 - 200 mg/dL - 185 - 191 -  HDL >=46 mg/dL - 76 - 72 -  Calc LDL 0 - 99 mg/dL - 97 - 106(H) -  Triglycerides <150 mg/dL - 60 - 64 -  Creatinine 0.50 - 1.10 mg/dL - 0.85 0.88 0.78 0.82   BP/Weight 11/29/2014 11/16/2014 10/19/2014 10/19/2014 09/16/2014 07/29/2014 5/73/2202  Systolic BP 542 706 237 628 315 176 160  Diastolic BP 74 71 78 76 77 82 65  Wt. (Lbs) 188.04 - 189.12 - - 192.8 -  BMI 33.32 - 33.51 - - 34.16 -   No flowsheet data found.

## 2014-11-30 NOTE — Assessment & Plan Note (Signed)
Patient educated about the importance of limiting  Carbohydrate intake , the need to commit to daily physical activity for a minimum of 30 minutes , and to commit weight loss. The fact that changes in all these areas will reduce or eliminate all together the development of diabetes is stressed.  Improved  Diabetic Labs Latest Ref Rng 11/23/2014 07/22/2014 03/30/2014 07/22/2013 03/16/2013  HbA1c <5.7 % 5.7(H) 5.8(H) 5.8(H) 5.7(H) 5.7(H)  Chol 0 - 200 mg/dL - 185 - 191 -  HDL >=46 mg/dL - 76 - 72 -  Calc LDL 0 - 99 mg/dL - 97 - 106(H) -  Triglycerides <150 mg/dL - 60 - 64 -  Creatinine 0.50 - 1.10 mg/dL - 0.85 0.88 0.78 0.82   BP/Weight 11/29/2014 11/16/2014 10/19/2014 10/19/2014 09/16/2014 07/29/2014 6/80/3212  Systolic BP 248 250 037 048 889 169 450  Diastolic BP 74 71 78 76 77 82 65  Wt. (Lbs) 188.04 - 189.12 - - 192.8 -  BMI 33.32 - 33.51 - - 34.16 -   No flowsheet data found.

## 2014-11-30 NOTE — Assessment & Plan Note (Signed)
Unchanged, stop phentermine Patient re-educated about  the importance of commitment to a  minimum of 150 minutes of exercise per week.  The importance of healthy food choices with portion control discussed. Encouraged to start a food diary, count calories and to consider  joining a support group. Sample diet sheets offered. Goals set by the patient for the next several months.   Weight /BMI 11/29/2014 10/19/2014 07/29/2014  WEIGHT 188 lb 0.6 oz 189 lb 1.9 oz 192 lb 12.8 oz  HEIGHT 5\' 3"  5\' 3"  5\' 3"   BMI 33.32 kg/m2 33.51 kg/m2 34.16 kg/m2    Current exercise per week 150  minutes.

## 2014-11-30 NOTE — Assessment & Plan Note (Signed)
Excess skin under both arms following significant weight loss, despite muscle strengthening  Exercises, pt  Unable to firm and tighten, this concerns her, she requests referral to Plastic surgeon to see if surgical help is affordable, referral entered

## 2014-12-02 ENCOUNTER — Ambulatory Visit (HOSPITAL_COMMUNITY)
Admission: RE | Admit: 2014-12-02 | Discharge: 2014-12-02 | Disposition: A | Discharge status: Home / Self Care | Payer: Federal, State, Local not specified - PPO | Source: Ambulatory Visit | Attending: Family Medicine | Admitting: Family Medicine

## 2014-12-02 DIAGNOSIS — Z1231 Encounter for screening mammogram for malignant neoplasm of breast: Secondary | ICD-10-CM

## 2014-12-14 ENCOUNTER — Encounter (HOSPITAL_COMMUNITY): Payer: Medicare Other | Attending: Hematology & Oncology

## 2014-12-14 VITALS — BP 122/72 | HR 81 | Temp 98.0°F | Resp 20

## 2014-12-14 DIAGNOSIS — E538 Deficiency of other specified B group vitamins: Secondary | ICD-10-CM | POA: Diagnosis not present

## 2014-12-14 MED ORDER — CYANOCOBALAMIN 1000 MCG/ML IJ SOLN
INTRAMUSCULAR | Status: AC
  Filled 2014-12-14: qty 1

## 2014-12-14 MED ORDER — CYANOCOBALAMIN 1000 MCG/ML IJ SOLN
1000.0000 ug | Freq: Once | INTRAMUSCULAR | Status: AC
  Administered 2014-12-14: 1000 ug via INTRAMUSCULAR

## 2014-12-14 NOTE — Progress Notes (Signed)
Sheri Jones presents today for injection per MD orders. B12 1000 mcg  administered IM in right upper outer Gluteal. Administration without incident. Patient tolerated well.  

## 2014-12-14 NOTE — Patient Instructions (Signed)
[facility] at [facility] Discharge Instructions  RECOMMENDATIONS MADE BY THE CONSULTANT AND ANY TEST RESULTS WILL BE SENT TO YOUR REFERRING PHYSICIAN.  Vitamin B12 1000 mcg injection given today as ordered. Return as scheduled.  Thank you for choosing [facility] at [facility] to provide your oncology and hematology care.  To afford each patient quality time with our provider, please arrive at least 15 minutes before your scheduled appointment time.    You need to re-schedule your appointment should you arrive 10 or more minutes late.  We strive to give you quality time with our providers, and arriving late affects you and other patients whose appointments are after yours.  Also, if you no show three or more times for appointments you may be dismissed from the clinic at the providers discretion.     Again, thank you for choosing [facility].  Our hope is that these requests will decrease the amount of time that you wait before being seen by our physicians.       _____________________________________________________________  Should you have questions after your visit to [facility], please contact our office at [phone] between the hours of 8:30 a.m. and 4:30 p.m.  Voicemails left after 4:30 p.m. will not be returned until the following business day.  For prescription refill requests, have your pharmacy contact our office.    

## 2014-12-20 ENCOUNTER — Ambulatory Visit (INDEPENDENT_AMBULATORY_CARE_PROVIDER_SITE_OTHER): Payer: Federal, State, Local not specified - PPO

## 2014-12-20 DIAGNOSIS — Z23 Encounter for immunization: Secondary | ICD-10-CM | POA: Diagnosis not present

## 2014-12-27 ENCOUNTER — Other Ambulatory Visit: Payer: Self-pay | Admitting: Family Medicine

## 2015-01-06 ENCOUNTER — Ambulatory Visit (INDEPENDENT_AMBULATORY_CARE_PROVIDER_SITE_OTHER): Payer: Federal, State, Local not specified - PPO

## 2015-01-06 ENCOUNTER — Ambulatory Visit (INDEPENDENT_AMBULATORY_CARE_PROVIDER_SITE_OTHER): Payer: Federal, State, Local not specified - PPO | Admitting: Orthopedic Surgery

## 2015-01-06 VITALS — BP 118/71 | Ht 63.0 in | Wt 188.0 lb

## 2015-01-06 DIAGNOSIS — M25561 Pain in right knee: Secondary | ICD-10-CM | POA: Diagnosis not present

## 2015-01-06 DIAGNOSIS — M1711 Unilateral primary osteoarthritis, right knee: Secondary | ICD-10-CM

## 2015-01-06 NOTE — Patient Instructions (Signed)
SURGERY-NOV 2 RT TKA    You have decided to proceed with knee replacement surgery. You have decided not to continue with nonoperative measures such as but not limited to oral medication, weight loss, activity modification, physical therapy, bracing, or injection.  We will perform the procedure commonly known as total knee replacement. Some of the risks associated with knee replacement surgery include but are not limited to Bleeding Infection Swelling Stiffness Blood clot Pain that persists even after surgery  Infection is especially devastating complication of knee surgery although rare. If infection does occur your implant will usually have to be removed and several surgeries will be needed to eradicate the infection prior to performing a repeat replacement.   If you're not comfortable with these risks and would like to continue with nonoperative treatment please let Dr. Aline Brochure know prior to your surgery.

## 2015-01-06 NOTE — Progress Notes (Signed)
Patient ID: Sheri Jones, female   DOB: 09/12/1945, 69 y.o.   MRN: [id number]  Follow up visit  Chief Complaint  Patient presents with  . Follow-up    6 month follow up + xray right knee    BP 118/71 mmHg  Ht 5\' 3"  (1.6 m)  Wt 188 lb 0.7 oz (85.295 kg)  BMI 33.32 kg/m2  Encounter Diagnosis  Name Primary?  . Right knee pain Yes    Routine follow-up discuss ongoing problems right knee. Patient now complains of pain more severe over the lateral joint line and peripatellar areas with issues with activities of daily living such as kneeling climbing squatting bending  System review negative  Exam shows a severe valgus knee no evidence of frank laxity of the collateral ligaments but flexion limited to 85 extension is full. Lateral joint line is tender significant crepitance is noted on range of motion skin looks good her mood is pleasant she is oriented 3 appearances normal vital signs are stable  X-ray show severe valgus knee  She will come back in October for preop visit for right total knee

## 2015-01-07 ENCOUNTER — Other Ambulatory Visit: Payer: Self-pay | Admitting: *Deleted

## 2015-01-18 ENCOUNTER — Encounter (HOSPITAL_COMMUNITY): Payer: Federal, State, Local not specified - PPO | Attending: Oncology

## 2015-01-18 VITALS — BP 104/72 | HR 74 | Temp 98.2°F | Resp 18

## 2015-01-18 DIAGNOSIS — E538 Deficiency of other specified B group vitamins: Secondary | ICD-10-CM | POA: Diagnosis not present

## 2015-01-18 MED ORDER — CYANOCOBALAMIN 1000 MCG/ML IJ SOLN: 1000.0000 ug | Freq: Once | INTRAMUSCULAR | Status: DC

## 2015-01-18 MED ORDER — CYANOCOBALAMIN 1000 MCG/ML IJ SOLN
INTRAMUSCULAR | Status: AC
  Filled 2015-01-18: qty 1

## 2015-01-18 NOTE — Progress Notes (Signed)
Sheri Jones presents today for injection per MD orders. B12 1067mcg administered SQ in left buttock. Administration without incident. Patient tolerated well.

## 2015-01-19 ENCOUNTER — Telehealth: Payer: Self-pay | Admitting: Orthopedic Surgery

## 2015-01-19 NOTE — Telephone Encounter (Signed)
Regarding in-patient/admit surgery scheduled 02/23/15 at [facility], [place], ICD-10 M17.11 and M25.561; per Roseanna Rainbow, primary insurer, no pre-authorization required, as patient also has (for hospital, Part A Medicare); Medicare guidelines therefore apply; per Amado Coe, 01/19/15, 2:36p.m, for reference.

## 2015-01-23 ENCOUNTER — Other Ambulatory Visit: Payer: Self-pay | Admitting: Family Medicine

## 2015-02-10 ENCOUNTER — Ambulatory Visit: Payer: Federal, State, Local not specified - PPO | Admitting: Orthopedic Surgery

## 2015-02-10 ENCOUNTER — Ambulatory Visit (INDEPENDENT_AMBULATORY_CARE_PROVIDER_SITE_OTHER): Payer: Federal, State, Local not specified - PPO | Admitting: Orthopedic Surgery

## 2015-02-10 VITALS — BP 119/75 | Ht 63.0 in | Wt 188.0 lb

## 2015-02-10 DIAGNOSIS — M1711 Unilateral primary osteoarthritis, right knee: Secondary | ICD-10-CM

## 2015-02-10 NOTE — Patient Instructions (Signed)
Surgery 11/2, follow up 11/14

## 2015-02-10 NOTE — Progress Notes (Signed)
Patient ID: Sheri Jones, female   DOB: Oct 15, 1945, 69 y.o.   MRN: [id number]  Follow up visit  Chief Complaint  Patient presents with  . Follow-up    follow up Rt knee, pre op TKA 02/23/15    BP 119/75 mmHg  Ht 5\' 3"  (1.6 m)  Wt 188 lb 0.6 oz (85.294 kg)  BMI 33.32 kg/m2  No diagnosis found.  Mr. Garciagarcia comes in for her preoperative visit she has a right total knee scheduled for November. We reviewed her pre-surgical, surgical and postsurgical course reviewed risks and benefits of surgery reviewed the procedure she is agreeable to continue with right total knee on November 2. Nothing else to add. Time spent 11 minutes   Repeat evaluation and review of her x-rays show severe valgus deformity of 10 between the tibia and femur

## 2015-02-15 ENCOUNTER — Encounter (HOSPITAL_COMMUNITY): Payer: Self-pay

## 2015-02-15 ENCOUNTER — Encounter (HOSPITAL_COMMUNITY): Payer: Medicare Other | Attending: Hematology & Oncology

## 2015-02-15 VITALS — BP 112/66 | HR 68 | Temp 97.7°F | Resp 18

## 2015-02-15 DIAGNOSIS — E538 Deficiency of other specified B group vitamins: Secondary | ICD-10-CM | POA: Diagnosis not present

## 2015-02-15 MED ORDER — CYANOCOBALAMIN 1000 MCG/ML IJ SOLN
INTRAMUSCULAR | Status: AC
  Filled 2015-02-15: qty 1

## 2015-02-15 MED ORDER — CYANOCOBALAMIN 1000 MCG/ML IJ SOLN
1000.0000 ug | Freq: Once | INTRAMUSCULAR | Status: AC
  Administered 2015-02-15: 1000 ug via INTRAMUSCULAR

## 2015-02-15 NOTE — Patient Instructions (Signed)
[facility] at [facility] Discharge Instructions  RECOMMENDATIONS MADE BY THE CONSULTANT AND ANY TEST RESULTS WILL BE SENT TO YOUR REFERRING PHYSICIAN.  B12 injection today. Return as scheduled for injections, lab work, and office visit.  Thank you for choosing [facility] at [facility] to provide your oncology and hematology care.  To afford each patient quality time with our provider, please arrive at least 15 minutes before your scheduled appointment time.    You need to re-schedule your appointment should you arrive 10 or more minutes late.  We strive to give you quality time with our providers, and arriving late affects you and other patients whose appointments are after yours.  Also, if you no show three or more times for appointments you may be dismissed from the clinic at the providers discretion.     Again, thank you for choosing [facility].  Our hope is that these requests will decrease the amount of time that you wait before being seen by our physicians.       _____________________________________________________________  Should you have questions after your visit to  Cancer Center, please contact our office at [phone] between the hours of 8:30 a.m. and 4:30 p.m.  Voicemails left after 4:30 p.m. will not be returned until the following business day.  For prescription refill requests, have your pharmacy contact our office.    

## 2015-02-15 NOTE — Progress Notes (Signed)
Sheri Jones presents today for injection per the provider's orders.  B12 administration without incident; see MAR for injection details.  Patient tolerated procedure well and without incident.  No questions or complaints noted at this time. 

## 2015-02-17 NOTE — Patient Instructions (Addendum)
Sheri Jones  02/17/2015     @PREFPERIOPPHARMACY @   Your procedure is scheduled on 02/23/2015  Report to [facility] at 6:15 am       Call this number if you have any concerns   [phone]   Remember:  Do not eat food or drink liquids after midnight.  Take these medicines the morning of surgery with A SIP OF WATER Zyrtec   Do not wear jewelry, make-up or nail polish.  Do not wear lotions, powders, or perfumes.  You may wear deodorant.  Do not shave 48 hours prior to surgery.  Men may shave face and neck.  Do not bring valuables to the hospital.  [facility] is not responsible for any belongings or valuables.  Contacts, dentures or bridgework may not be worn into surgery.  Leave your suitcase in the car.  After surgery it may be brought to your room.  For patients admitted to the hospital, discharge time will be determined by your treatment team.  Patients discharged the day of surgery will not be allowed to drive home.   Name and phone number of your driver:   family -pecial instructions:  n/a  Please read over the following fact sheets that you were given. Care and Recovery After Surgery   Total Knee Replacement Total knee replacement is a procedure to replace your knee joint with an artificial knee joint (prosthetic knee joint). The purpose of this surgery is to reduce pain and improve your knee function. LET [facility] CARE PROVIDER KNOW ABOUT:   Any allergies you have.  All medicines you are taking, including vitamins, herbs, eye drops, creams, and over-the-counter medicines.  Previous problems you or members of your family have had with the use of anesthetics.  Any blood disorders you have.  Previous surgeries you have had.  Medical conditions you have. RISKS AND COMPLICATIONS  Generally, total knee replacement is a safe procedure. However, problems can occur, including:  Loss of range of motion of the knee or instability of the knee.  Loosening of  the prosthesis.  Infection.  Persistent pain. BEFORE THE PROCEDURE   Plan to have someone take you home after the procedure.  Do not eat or drink anything after midnight on the night before the procedure or as directed by your health care provider.  Ask your health care provider about:  Changing or stopping your regular medicines. This is especially important if you are taking diabetes medicines or blood thinners.  Taking medicines such as aspirin and ibuprofen. These medicines can thin your blood. Do not take these medicines before your procedure if your health care provider asks you not to.  Ask your health care provider about how your surgical site will be marked or identified.  You may be given antibiotic medicines to help prevent infection. PROCEDURE   To reduce your risk of infection:  Your health care team will wash or sanitize their hands.  Your skin will be washed with soap.  An IV tube will be inserted into one of your veins. You will be given one or more of the following:  A medicine that makes you drowsy (sedative).  A medicine that makes you fall asleep (general anesthetic).  A medicine injected into your spine that numbs your body below the waist (spinal anesthetic).  A medicine to block feeling in your leg (nerve block) to help ease pain after surgery.  An incision will be made in your knee. Your surgeon will take out  any damaged cartilage and bone by sawing off the damaged surfaces.  The surgeon will then put a new metal liner over the sawed-off portion of your thigh bone (femur) and a plastic liner over the sawed-off portion of one of the bones of your lower leg (tibia). This is to restore alignment and function to your knee. A plastic piece is often used to restore the surface of your knee cap. AFTER THE PROCEDURE   You will stay in a recovery area until your medicines wear off.  You may have drainage tubes to drain excess fluid from your knee. These  tubes attach to a device that removes these fluids.  Once you are awake, stable, and taking fluids well, you will be taken to your hospital room.  You may be directed to take actions to help prevent blood clots. These may include:  Walking shortly after surgery, with someone assisting you. Moving around after surgery helps to improve blood flow.  Wearing compression stockings or using different types of devices.  You will receive physical therapy as prescribed by your health care provider.   This information is not intended to replace advice given to you by your health care provider. Make sure you discuss any questions you have with your health care provider.   Document Released: 07/16/2000 Document Revised: 12/29/2014 Document Reviewed: 05/20/2011 Elsevier Interactive Patient Education [employer].

## 2015-02-18 ENCOUNTER — Encounter (HOSPITAL_COMMUNITY)
Admission: RE | Admit: 2015-02-18 | Discharge: 2015-02-18 | Disposition: A | Discharge status: Home / Self Care | Payer: Federal, State, Local not specified - PPO | Source: Ambulatory Visit | Attending: Orthopedic Surgery | Admitting: Orthopedic Surgery

## 2015-02-18 ENCOUNTER — Other Ambulatory Visit: Payer: Self-pay

## 2015-02-18 ENCOUNTER — Encounter (HOSPITAL_COMMUNITY): Payer: Self-pay

## 2015-02-18 DIAGNOSIS — Z01818 Encounter for other preprocedural examination: Secondary | ICD-10-CM | POA: Insufficient documentation

## 2015-02-18 DIAGNOSIS — M13861 Other specified arthritis, right knee: Secondary | ICD-10-CM | POA: Diagnosis not present

## 2015-02-18 LAB — BASIC METABOLIC PANEL
Anion gap: 7 (ref 5–15)
BUN: 20 mg/dL (ref 6–20)
CO2: 30 mmol/L (ref 22–32)
CREATININE: 0.94 mg/dL (ref 0.44–1.00)
Calcium: 9.1 mg/dL (ref 8.9–10.3)
Chloride: 103 mmol/L (ref 101–111)
GFR calc Af Amer: >60 mL/min (ref >60)
GFR calc non Af Amer: >60 mL/min (ref >60)
GLUCOSE: 72 mg/dL (ref 65–99)
Potassium: 4.3 mmol/L (ref 3.5–5.1)
Sodium: 140 mmol/L (ref 135–145)

## 2015-02-18 LAB — CBC WITH DIFFERENTIAL/PLATELET
BASOS ABS: 0 10*3/uL (ref 0.0–0.1)
BASOS PCT: 0 %
EOS PCT: 8 %
Eosinophils Absolute: 0.4 10*3/uL (ref 0.0–0.7)
HCT: 37.3 % (ref 36.0–46.0)
Hemoglobin: 12.7 g/dL (ref 12.0–15.0)
LYMPHS PCT: 42 %
Lymphs Abs: 1.9 10*3/uL (ref 0.7–4.0)
MCH: 31.4 pg (ref 26.0–34.0)
MCHC: 34 g/dL (ref 30.0–36.0)
MCV: 92.3 fL (ref 78.0–100.0)
MONO ABS: 0.5 10*3/uL (ref 0.1–1.0)
Monocytes Relative: 11 %
Neutro Abs: 1.8 10*3/uL (ref 1.7–7.7)
Neutrophils Relative %: 39 %
PLATELETS: 160 10*3/uL (ref 150–400)
RBC: 4.04 MIL/uL (ref 3.87–5.11)
RDW: 14.8 % (ref 11.5–15.5)
WBC: 4.6 10*3/uL (ref 4.0–10.5)

## 2015-02-18 LAB — SURGICAL PCR SCREEN
MRSA, PCR: NEGATIVE
STAPHYLOCOCCUS AUREUS: NEGATIVE

## 2015-02-18 LAB — APTT: aPTT: 26 seconds (ref 24–37)

## 2015-02-18 LAB — PROTIME-INR
INR: 1.05 (ref 0.00–1.49)
Prothrombin Time: 13.9 seconds (ref 11.6–15.2)

## 2015-02-21 ENCOUNTER — Other Ambulatory Visit: Payer: Self-pay | Admitting: Family Medicine

## 2015-02-21 LAB — ABO/RH: ABO/RH(D): O POS

## 2015-02-21 NOTE — H&P (Signed)
TOTAL KNEE ADMISSION H&P  Patient is being admitted for right total knee arthroplasty.  Subjective:  Chief Complaint:right knee pain.  HPI: Sheri Jones, 69 y.o. female, has a history of pain and functional disability in the right knee due to arthritis and has failed non-surgical conservative treatments for greater than 12 weeks to includeNSAID's and/or analgesics, corticosteriod injections, flexibility and strengthening excercises, use of assistive devices, weight reduction as appropriate and activity modification.  Onset of symptoms was gradual, starting Several years ago with gradually worsening course since that time. The patient noted no past surgery on the right knee(s).  Patient currently rates pain in the right knee(s) at 7 out of 10 with activity. Patient has worsening of pain with activity and weight bearing, pain that interferes with activities of daily living, pain with passive range of motion and crepitus.  Patient has evidence of subchondral cysts, subchondral sclerosis, periarticular osteophytes and joint space narrowing by imaging studies. There is no active infection.  Patient Active Problem List   Diagnosis Date Noted  . Knee pain, right 11/29/2014  . Excess skin of arm 11/29/2014  . Osteopenia 08/12/2013  . 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 Prediabetes 08/07/2011  . Vitamin D deficiency 08/07/2011  . Abnormal facial hair 03/29/2011  . PERSONAL HX COLONIC POLYPS 07/05/2010  . KNEE, ARTHRITIS, DEGEN./OSTEO 01/16/2010  . Vitamin B12 deficiency disease 10/28/2008  . 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 Obesity 08/15/2007  . Essential hypertension 08/15/2007   Past Medical History  Diagnosis Date  . Allergic rhinitis due to pollen   . Osteoarthrosis, unspecified whether generalized or localized, lower leg   . Obesity, unspecified   . Unspecified essential hypertension   . Anemia   . Diabetes mellitus without complication ([place])   . Gastric bypass status for  obesity 06/05/2013  . Vitamin B12 deficiency disease 10/28/2008    Qualifier: Diagnosis of  By: Moshe Cipro MD, Joycelyn Schmid      Past Surgical History  Procedure Laterality Date  . Gastric bypass  2004  . Colonoscopy  2008    No prescriptions prior to admission   Allergies  Allergen Reactions  . Eggs Or Egg-Derived Products Diarrhea    Social History  Substance Use Topics  . Smoking status: Never Smoker   . Smokeless tobacco: Not on file  . Alcohol Use: No    Family History  Problem Relation Age of Onset  . Hypertension Mother   . Hypertension Sister   . Hypertension Sister   . Stroke Sister 62  . Hypertension Sister   . Breast cancer Maternal Aunt   . Lung cancer Maternal Aunt   . Heart disease Father 15    massive heart attack      Review of Systems  Musculoskeletal: Positive for joint pain.  All other systems reviewed and are negative.   Objective:  Physical Exam  Nursing note and vitals reviewed. Constitutional: She is oriented to person, place, and time. She appears well-developed and well-nourished. No distress.  HENT:  Head: Normocephalic and atraumatic.  Eyes: Conjunctivae and EOM are normal. Pupils are equal, round, and reactive to light. Right eye exhibits no discharge. Left eye exhibits no discharge. No scleral icterus.  Neck: Normal range of motion. No JVD present. No tracheal deviation present. No thyromegaly present.  Cardiovascular: Normal rate.   Respiratory: Effort normal. No stridor. She has no wheezes.  GI: Soft.  Musculoskeletal:       Right shoulder: Normal.       Left  shoulder: Normal.       Right hip: Normal.       Left hip: Normal.       Right knee: She exhibits decreased range of motion, effusion, abnormal alignment, bony tenderness and MCL laxity. She exhibits no swelling, no ecchymosis, no laceration, no erythema, no LCL laxity, normal patellar mobility and normal meniscus. Tenderness found. Lateral joint line and patellar tendon tenderness  noted. No medial joint line, no MCL and no LCL tenderness noted.       Right ankle: Normal.       Left ankle: Normal.       Right foot: Normal.       Left foot: Normal.  Lymphadenopathy:    She has no cervical adenopathy.  Neurological: She is alert and oriented to person, place, and time. She has normal reflexes. She displays normal reflexes. No cranial nerve deficit. She exhibits normal muscle tone. Coordination normal.  Skin: Skin is warm and dry. No rash noted. She is not diaphoretic. No erythema. No pallor.  Psychiatric: She has a normal mood and affect. Her behavior is normal. Judgment and thought content normal.    Vital signs in last 24 hours:    Labs:   Estimated body mass index is 33.32 kg/(m^2) as calculated from the following:   Height as of 01/06/15: 5\' 3"  (1.6 m).   Weight as of 01/06/15: 188 lb 0.7 oz (85.295 kg).   Imaging Review Plain radiographs demonstrate severe degenerative joint disease of the right knee(s). The overall alignment issignificant valgus. The bone quality appears to be good for age and reported activity level.  Assessment/Plan:  End stage arthritis, right knee   The patient history, physical examination, clinical judgment of the provider and imaging studies are consistent with end stage degenerative joint disease of the right knee(s) and total knee arthroplasty is deemed medically necessary. The treatment options including medical management, injection therapy arthroscopy and arthroplasty were discussed at length. The risks and benefits of total knee arthroplasty were presented and reviewed. The risks due to aseptic loosening, infection, stiffness, patella tracking problems, thromboembolic complications and other imponderables were discussed. The patient acknowledged the explanation, agreed to proceed with the plan and consent was signed. Patient is being admitted for inpatient treatment for surgery, pain control, PT, OT, prophylactic antibiotics, VTE  prophylaxis, progressive ambulation and ADL's and discharge planning. The patient is planning to be discharged home with home health services  RIGHT TKA

## 2015-02-22 MED ORDER — TRANEXAMIC ACID 1000 MG/10ML IV SOLN
1000.0000 mg | INTRAVENOUS | Status: AC
  Administered 2015-02-23: 1000 mg via INTRAVENOUS
  Filled 2015-02-22: qty 10

## 2015-02-23 ENCOUNTER — Inpatient Hospital Stay (HOSPITAL_COMMUNITY)
Admission: RE | Admit: 2015-02-23 | Discharge: 2015-02-26 | DRG: 470 | Disposition: A | Discharge status: Home Health | Payer: Medicare Other | Source: Ambulatory Visit | Attending: Orthopedic Surgery | Admitting: Orthopedic Surgery

## 2015-02-23 ENCOUNTER — Encounter (HOSPITAL_COMMUNITY)
Admission: RE | Disposition: A | Discharge status: Home Health | Payer: Self-pay | Source: Ambulatory Visit | Attending: Orthopedic Surgery

## 2015-02-23 ENCOUNTER — Inpatient Hospital Stay (HOSPITAL_COMMUNITY): Payer: Medicare Other | Admitting: Anesthesiology

## 2015-02-23 ENCOUNTER — Encounter (HOSPITAL_COMMUNITY): Payer: Self-pay

## 2015-02-23 ENCOUNTER — Inpatient Hospital Stay (HOSPITAL_COMMUNITY): Payer: Medicare Other

## 2015-02-23 DIAGNOSIS — Z8249 Family history of ischemic heart disease and other diseases of the circulatory system: Secondary | ICD-10-CM | POA: Diagnosis not present

## 2015-02-23 DIAGNOSIS — E119 Type 2 diabetes mellitus without complications: Secondary | ICD-10-CM | POA: Diagnosis present

## 2015-02-23 DIAGNOSIS — I10 Essential (primary) hypertension: Secondary | ICD-10-CM | POA: Diagnosis present

## 2015-02-23 DIAGNOSIS — Z9884 Bariatric surgery status: Secondary | ICD-10-CM | POA: Diagnosis not present

## 2015-02-23 DIAGNOSIS — Z803 Family history of malignant neoplasm of breast: Secondary | ICD-10-CM

## 2015-02-23 DIAGNOSIS — Z96659 Presence of unspecified artificial knee joint: Secondary | ICD-10-CM

## 2015-02-23 DIAGNOSIS — Z823 Family history of stroke: Secondary | ICD-10-CM | POA: Diagnosis not present

## 2015-02-23 DIAGNOSIS — M8580 Other specified disorders of bone density and structure, unspecified site: Secondary | ICD-10-CM | POA: Diagnosis present

## 2015-02-23 DIAGNOSIS — M1711 Unilateral primary osteoarthritis, right knee: Secondary | ICD-10-CM | POA: Diagnosis present

## 2015-02-23 DIAGNOSIS — Z801 Family history of malignant neoplasm of trachea, bronchus and lung: Secondary | ICD-10-CM

## 2015-02-23 DIAGNOSIS — M25561 Pain in right knee: Secondary | ICD-10-CM | POA: Diagnosis present

## 2015-02-23 HISTORY — PX: TOTAL KNEE ARTHROPLASTY: SHX125

## 2015-02-23 LAB — GLUCOSE, CAPILLARY: Glucose-Capillary: 72 mg/dL (ref 65–99)

## 2015-02-23 SURGERY — ARTHROPLASTY, KNEE, TOTAL
Anesthesia: General | Laterality: Right

## 2015-02-23 MED ORDER — KETOROLAC TROMETHAMINE 15 MG/ML IJ SOLN
15.0000 mg | Freq: Four times a day (QID) | INTRAMUSCULAR | Status: AC
  Administered 2015-02-23 – 2015-02-24 (×2): 15 mg via INTRAVENOUS
  Filled 2015-02-23 (×3): qty 1

## 2015-02-23 MED ORDER — BUPIVACAINE LIPOSOME 1.3 % IJ SUSP: Freq: Once | INTRAMUSCULAR | Status: DC

## 2015-02-23 MED ORDER — ALUM & MAG HYDROXIDE-SIMETH 200-200-20 MG/5ML PO SUSP: 30.0000 mL | ORAL | Status: DC | PRN

## 2015-02-23 MED ORDER — FENTANYL CITRATE (PF) 250 MCG/5ML IJ SOLN
INTRAMUSCULAR | Status: AC
  Filled 2015-02-23: qty 25

## 2015-02-23 MED ORDER — BISACODYL 10 MG RE SUPP: 10.0000 mg | Freq: Every day | RECTAL | Status: DC | PRN

## 2015-02-23 MED ORDER — 0.9 % SODIUM CHLORIDE (POUR BTL) OPTIME
TOPICAL | Status: DC | PRN
  Administered 2015-02-23: 1000 mL

## 2015-02-23 MED ORDER — HYDROMORPHONE HCL 1 MG/ML IJ SOLN
0.5000 mg | INTRAMUSCULAR | Status: DC | PRN
  Administered 2015-02-23 – 2015-02-24 (×2): 0.5 mg via INTRAVENOUS
  Filled 2015-02-23 (×2): qty 1

## 2015-02-23 MED ORDER — CYANOCOBALAMIN 1000 MCG/ML IJ SOLN
1000.0000 ug | INTRAMUSCULAR | Status: DC
  Filled 2015-02-23: qty 1

## 2015-02-23 MED ORDER — ONDANSETRON HCL 4 MG/2ML IJ SOLN
INTRAMUSCULAR | Status: DC | PRN
  Administered 2015-02-23: 4 mg via INTRAVENOUS

## 2015-02-23 MED ORDER — ONDANSETRON HCL 4 MG/2ML IJ SOLN
4.0000 mg | Freq: Once | INTRAMUSCULAR | Status: AC
  Administered 2015-02-23: 4 mg via INTRAVENOUS
  Filled 2015-02-23: qty 2

## 2015-02-23 MED ORDER — VITAMIN D (ERGOCALCIFEROL) 1.25 MG (50000 UNIT) PO CAPS: 50000.0000 [IU] | ORAL_CAPSULE | ORAL | Status: DC

## 2015-02-23 MED ORDER — CELECOXIB 400 MG PO CAPS
400.0000 mg | ORAL_CAPSULE | Freq: Once | ORAL | Status: AC
  Administered 2015-02-23: 400 mg via ORAL
  Filled 2015-02-23: qty 1

## 2015-02-23 MED ORDER — MENTHOL 3 MG MT LOZG: 1.0000 | LOZENGE | OROMUCOSAL | Status: DC | PRN

## 2015-02-23 MED ORDER — LIDOCAINE HCL (CARDIAC) 20 MG/ML IV SOLN
INTRAVENOUS | Status: DC | PRN
  Administered 2015-02-23: 30 mg via INTRAVENOUS

## 2015-02-23 MED ORDER — CHLORHEXIDINE GLUCONATE 4 % EX LIQD: 60.0000 mL | Freq: Once | CUTANEOUS | Status: DC

## 2015-02-23 MED ORDER — ONDANSETRON HCL 4 MG/2ML IJ SOLN: 4.0000 mg | Freq: Once | INTRAMUSCULAR | Status: DC | PRN

## 2015-02-23 MED ORDER — CEFAZOLIN SODIUM-DEXTROSE 2-3 GM-% IV SOLR
INTRAVENOUS | Status: AC
Start: 2015-02-23 — End: 2015-02-23
  Filled 2015-02-23: qty 50

## 2015-02-23 MED ORDER — ASPIRIN EC 325 MG PO TBEC
325.0000 mg | DELAYED_RELEASE_TABLET | Freq: Every day | ORAL | Status: DC
  Administered 2015-02-24 – 2015-02-26 (×3): 325 mg via ORAL
  Filled 2015-02-23 (×3): qty 1

## 2015-02-23 MED ORDER — BUPIVACAINE LIPOSOME 1.3 % IJ SUSP
INTRAMUSCULAR | Status: AC
  Filled 2015-02-23: qty 20

## 2015-02-23 MED ORDER — METFORMIN HCL 500 MG PO TABS
500.0000 mg | ORAL_TABLET | Freq: Two times a day (BID) | ORAL | Status: DC
  Administered 2015-02-23 – 2015-02-26 (×6): 500 mg via ORAL
  Filled 2015-02-23 (×6): qty 1

## 2015-02-23 MED ORDER — FENTANYL CITRATE (PF) 250 MCG/5ML IJ SOLN
INTRAMUSCULAR | Status: DC | PRN
  Administered 2015-02-23 (×4): 25 ug via INTRAVENOUS
  Administered 2015-02-23 (×2): 50 ug via INTRAVENOUS
  Administered 2015-02-23: 100 ug via INTRAVENOUS
  Administered 2015-02-23 (×4): 50 ug via INTRAVENOUS

## 2015-02-23 MED ORDER — PREGABALIN 50 MG PO CAPS
50.0000 mg | ORAL_CAPSULE | Freq: Once | ORAL | Status: AC
  Administered 2015-02-23: 50 mg via ORAL
  Filled 2015-02-23: qty 2

## 2015-02-23 MED ORDER — LORATADINE 10 MG PO TABS
10.0000 mg | ORAL_TABLET | Freq: Every day | ORAL | Status: DC
  Administered 2015-02-23 – 2015-02-26 (×4): 10 mg via ORAL
  Filled 2015-02-23 (×4): qty 1

## 2015-02-23 MED ORDER — DEXAMETHASONE SODIUM PHOSPHATE 10 MG/ML IJ SOLN
10.0000 mg | Freq: Once | INTRAMUSCULAR | Status: AC
  Administered 2015-02-24: 10 mg via INTRAVENOUS
  Filled 2015-02-23: qty 1

## 2015-02-23 MED ORDER — BUPIVACAINE LIPOSOME 1.3 % IJ SUSP
INTRAMUSCULAR | Status: DC | PRN
  Administered 2015-02-23: 60 mL

## 2015-02-23 MED ORDER — MIDAZOLAM HCL 2 MG/2ML IJ SOLN
1.0000 mg | INTRAMUSCULAR | Status: DC | PRN
  Administered 2015-02-23: 2 mg via INTRAVENOUS

## 2015-02-23 MED ORDER — CEFAZOLIN SODIUM-DEXTROSE 2-3 GM-% IV SOLR
2.0000 g | Freq: Four times a day (QID) | INTRAVENOUS | Status: AC
Start: 2015-02-23 — End: 2015-02-23
  Administered 2015-02-23 (×2): 2 g via INTRAVENOUS
  Filled 2015-02-23 (×2): qty 50

## 2015-02-23 MED ORDER — BUPIVACAINE-EPINEPHRINE (PF) 0.5% -1:200000 IJ SOLN
INTRAMUSCULAR | Status: DC | PRN
  Administered 2015-02-23: 30 mL

## 2015-02-23 MED ORDER — SODIUM CHLORIDE 0.9 % IV SOLN
INTRAVENOUS | Status: DC
  Administered 2015-02-23: 15:00:00 via INTRAVENOUS

## 2015-02-23 MED ORDER — ONDANSETRON HCL 4 MG/2ML IJ SOLN: 4.0000 mg | Freq: Four times a day (QID) | INTRAMUSCULAR | Status: DC | PRN

## 2015-02-23 MED ORDER — METHOCARBAMOL 500 MG PO TABS: 500.0000 mg | ORAL_TABLET | Freq: Four times a day (QID) | ORAL | Status: DC | PRN

## 2015-02-23 MED ORDER — MAGNESIUM CITRATE PO SOLN: 1.0000 | Freq: Once | ORAL | Status: DC | PRN

## 2015-02-23 MED ORDER — METHOCARBAMOL 1000 MG/10ML IJ SOLN: 500.0000 mg | Freq: Four times a day (QID) | INTRAVENOUS | Status: DC | PRN

## 2015-02-23 MED ORDER — BUPIVACAINE-EPINEPHRINE (PF) 0.5% -1:200000 IJ SOLN
INTRAMUSCULAR | Status: AC
  Filled 2015-02-23: qty 30

## 2015-02-23 MED ORDER — DIPHENHYDRAMINE HCL 12.5 MG/5ML PO ELIX: 12.5000 mg | ORAL_SOLUTION | ORAL | Status: DC | PRN

## 2015-02-23 MED ORDER — OXYCODONE HCL 5 MG PO TABS
5.0000 mg | ORAL_TABLET | Freq: Once | ORAL | Status: AC
  Administered 2015-02-23: 5 mg via ORAL
  Filled 2015-02-23: qty 1

## 2015-02-23 MED ORDER — OXYCODONE HCL 5 MG PO TABS: 5.0000 mg | ORAL_TABLET | ORAL | Status: DC | PRN

## 2015-02-23 MED ORDER — PREGABALIN 50 MG PO CAPS
50.0000 mg | ORAL_CAPSULE | Freq: Three times a day (TID) | ORAL | Status: DC
  Administered 2015-02-23 – 2015-02-26 (×9): 50 mg via ORAL
  Filled 2015-02-23 (×9): qty 1

## 2015-02-23 MED ORDER — CELECOXIB 100 MG PO CAPS
200.0000 mg | ORAL_CAPSULE | Freq: Two times a day (BID) | ORAL | Status: DC
  Administered 2015-02-23 – 2015-02-26 (×6): 200 mg via ORAL
  Filled 2015-02-23 (×6): qty 2

## 2015-02-23 MED ORDER — HYDROCODONE-ACETAMINOPHEN 10-325 MG PO TABS
1.0000 | ORAL_TABLET | ORAL | Status: DC
  Administered 2015-02-23 – 2015-02-26 (×17): 1 via ORAL
  Filled 2015-02-23 (×17): qty 1

## 2015-02-23 MED ORDER — METOCLOPRAMIDE HCL 5 MG/ML IJ SOLN
5.0000 mg | Freq: Three times a day (TID) | INTRAMUSCULAR | Status: DC | PRN
  Administered 2015-02-23: 5 mg via INTRAVENOUS
  Filled 2015-02-23: qty 2

## 2015-02-23 MED ORDER — LACTATED RINGERS IV SOLN
INTRAVENOUS | Status: DC
  Administered 2015-02-23 (×2): via INTRAVENOUS

## 2015-02-23 MED ORDER — PREGABALIN 25 MG PO CAPS
ORAL_CAPSULE | ORAL | Status: AC
  Filled 2015-02-23: qty 1

## 2015-02-23 MED ORDER — METOCLOPRAMIDE HCL 10 MG PO TABS: 5.0000 mg | ORAL_TABLET | Freq: Three times a day (TID) | ORAL | Status: DC | PRN

## 2015-02-23 MED ORDER — HYDROCHLOROTHIAZIDE 25 MG PO TABS
25.0000 mg | ORAL_TABLET | Freq: Every day | ORAL | Status: DC
  Administered 2015-02-23 – 2015-02-26 (×4): 25 mg via ORAL
  Filled 2015-02-23 (×4): qty 1

## 2015-02-23 MED ORDER — MIDAZOLAM HCL 2 MG/2ML IJ SOLN
INTRAMUSCULAR | Status: AC
  Filled 2015-02-23: qty 2

## 2015-02-23 MED ORDER — CENTRUM ULTRA WOMENS PO TABS: ORAL_TABLET | Freq: Every day | ORAL | Status: DC

## 2015-02-23 MED ORDER — PHENOL 1.4 % MT LIQD: 1.0000 | OROMUCOSAL | Status: DC | PRN

## 2015-02-23 MED ORDER — PROPOFOL 10 MG/ML IV BOLUS
INTRAVENOUS | Status: AC
  Filled 2015-02-23: qty 20

## 2015-02-23 MED ORDER — SODIUM CHLORIDE 0.9 % IR SOLN
Status: DC | PRN
  Administered 2015-02-23: 3000 mL

## 2015-02-23 MED ORDER — CALCIUM-VITAMIN D 500-125 MG-UNIT PO TABS: ORAL_TABLET | Freq: Three times a day (TID) | ORAL | Status: DC

## 2015-02-23 MED ORDER — METHOCARBAMOL 1000 MG/10ML IJ SOLN
500.0000 mg | Freq: Once | INTRAVENOUS | Status: DC
  Filled 2015-02-23: qty 5

## 2015-02-23 MED ORDER — SODIUM CHLORIDE 0.9 % IJ SOLN
INTRAMUSCULAR | Status: AC
  Filled 2015-02-23: qty 40

## 2015-02-23 MED ORDER — POTASSIUM CHLORIDE CRYS ER 10 MEQ PO TBCR
10.0000 meq | EXTENDED_RELEASE_TABLET | Freq: Two times a day (BID) | ORAL | Status: DC
  Administered 2015-02-23 – 2015-02-26 (×7): 10 meq via ORAL
  Filled 2015-02-23 (×18): qty 1

## 2015-02-23 MED ORDER — DOCUSATE SODIUM 100 MG PO CAPS
100.0000 mg | ORAL_CAPSULE | Freq: Two times a day (BID) | ORAL | Status: DC
  Administered 2015-02-23 – 2015-02-26 (×7): 100 mg via ORAL
  Filled 2015-02-23 (×7): qty 1

## 2015-02-23 MED ORDER — FENTANYL CITRATE (PF) 100 MCG/2ML IJ SOLN
25.0000 ug | INTRAMUSCULAR | Status: DC | PRN
  Administered 2015-02-23 (×2): 50 ug via INTRAVENOUS

## 2015-02-23 MED ORDER — POLYETHYLENE GLYCOL 3350 17 G PO PACK
17.0000 g | PACK | Freq: Every day | ORAL | Status: DC
Start: 2015-02-23 — End: 2015-02-26
  Administered 2015-02-23 – 2015-02-26 (×4): 17 g via ORAL
  Filled 2015-02-23 (×4): qty 1

## 2015-02-23 MED ORDER — ROCURONIUM BROMIDE 100 MG/10ML IV SOLN
INTRAVENOUS | Status: DC | PRN
  Administered 2015-02-23: 30 mg via INTRAVENOUS

## 2015-02-23 MED ORDER — ADULT MULTIVITAMIN W/MINERALS CH
1.0000 | ORAL_TABLET | Freq: Every day | ORAL | Status: DC
  Administered 2015-02-23 – 2015-02-26 (×4): 1 via ORAL
  Filled 2015-02-23 (×4): qty 1

## 2015-02-23 MED ORDER — FENTANYL CITRATE (PF) 100 MCG/2ML IJ SOLN
INTRAMUSCULAR | Status: AC
  Filled 2015-02-23: qty 2

## 2015-02-23 MED ORDER — ONDANSETRON HCL 4 MG PO TABS: 4.0000 mg | ORAL_TABLET | Freq: Four times a day (QID) | ORAL | Status: DC | PRN

## 2015-02-23 MED ORDER — FENTANYL CITRATE (PF) 100 MCG/2ML IJ SOLN
INTRAMUSCULAR | Status: AC
  Filled 2015-02-23: qty 4

## 2015-02-23 MED ORDER — CALCIUM CARBONATE-VITAMIN D 500-200 MG-UNIT PO TABS
1.0000 | ORAL_TABLET | Freq: Three times a day (TID) | ORAL | Status: DC
  Administered 2015-02-23 – 2015-02-26 (×10): 1 via ORAL
  Filled 2015-02-23 (×10): qty 1

## 2015-02-23 MED ORDER — CEFAZOLIN SODIUM-DEXTROSE 2-3 GM-% IV SOLR
2.0000 g | INTRAVENOUS | Status: AC
  Administered 2015-02-23: 2 g via INTRAVENOUS

## 2015-02-23 SURGICAL SUPPLY — 61 items
BAG HAMPER (MISCELLANEOUS) ×3 IMPLANT
BANDAGE ESMARK 6X9 LF (GAUZE/BANDAGES/DRESSINGS) ×1 IMPLANT
BLADE HEX COATED 2.75 (ELECTRODE) ×3 IMPLANT
BNDG CMPR 9X6 STRL LF SNTH (GAUZE/BANDAGES/DRESSINGS) ×1
BNDG ESMARK 6X9 LF (GAUZE/BANDAGES/DRESSINGS) ×3
CAP KNEE TOTAL 3 SIGMA ×2 IMPLANT
CEMENT HV SMART SET (Cement) ×5 IMPLANT
CLOTH BEACON ORANGE TIMEOUT ST (SAFETY) ×3 IMPLANT
COOLER CRYO CUFF IC AND MOTOR (MISCELLANEOUS) ×3 IMPLANT
COVER LIGHT HANDLE STERIS (MISCELLANEOUS) ×6 IMPLANT
COVER PROBE W GEL 5X96 (DRAPES) ×3 IMPLANT
CUFF CRYO KNEE18X23 MED (MISCELLANEOUS) ×2 IMPLANT
CUFF TOURNIQUET SINGLE 34IN LL (TOURNIQUET CUFF) ×2 IMPLANT
DECANTER SPIKE VIAL GLASS SM (MISCELLANEOUS) ×3 IMPLANT
DRAPE BACK TABLE (DRAPES) ×3 IMPLANT
DRAPE EXTREMITY T 121X128X90 (DRAPE) ×3 IMPLANT
DRSG AQUACEL AG ADV 3.5X10 (GAUZE/BANDAGES/DRESSINGS) ×3 IMPLANT
DURAPREP 26ML APPLICATOR (WOUND CARE) ×6 IMPLANT
ELECT REM PT RETURN 9FT ADLT (ELECTROSURGICAL) ×3
ELECTRODE REM PT RTRN 9FT ADLT (ELECTROSURGICAL) ×1 IMPLANT
EVACUATOR 3/16  PVC DRAIN (DRAIN) ×2
EVACUATOR 3/16 PVC DRAIN (DRAIN) ×1 IMPLANT
GLOVE BIO SURGEON STRL SZ7 (GLOVE) ×8 IMPLANT
GLOVE BIOGEL PI IND STRL 7.0 (GLOVE) IMPLANT
GLOVE BIOGEL PI INDICATOR 7.0 (GLOVE) ×8
GLOVE SKINSENSE NS SZ8.0 LF (GLOVE) ×4
GLOVE SKINSENSE STRL SZ8.0 LF (GLOVE) ×2 IMPLANT
GLOVE SS N UNI LF 8.5 STRL (GLOVE) ×3 IMPLANT
GOWN STRL REUS W/ TWL LRG LVL3 (GOWN DISPOSABLE) ×1 IMPLANT
GOWN STRL REUS W/TWL LRG LVL3 (GOWN DISPOSABLE) ×9 IMPLANT
GOWN STRL REUS W/TWL XL LVL3 (GOWN DISPOSABLE) ×5 IMPLANT
HANDPIECE INTERPULSE COAX TIP (DISPOSABLE) ×3
HOOD W/PEELAWAY (MISCELLANEOUS) ×12 IMPLANT
INST SET MAJOR BONE (KITS) ×3 IMPLANT
IV NS IRRIG 3000ML ARTHROMATIC (IV SOLUTION) ×3 IMPLANT
KIT BLADEGUARD II DBL (SET/KITS/TRAYS/PACK) ×3 IMPLANT
KIT ROOM TURNOVER APOR (KITS) ×3 IMPLANT
MANIFOLD NEPTUNE II (INSTRUMENTS) ×3 IMPLANT
MARKER SKIN DUAL TIP RULER LAB (MISCELLANEOUS) ×3 IMPLANT
NDL HYPO 21X1.5 SAFETY (NEEDLE) ×1 IMPLANT
NEEDLE HYPO 21X1.5 SAFETY (NEEDLE) ×3 IMPLANT
NS IRRIG 1000ML POUR BTL (IV SOLUTION) ×3 IMPLANT
PACK TOTAL JOINT (CUSTOM PROCEDURE TRAY) ×3 IMPLANT
PAD ARMBOARD 7.5X6 YLW CONV (MISCELLANEOUS) ×3 IMPLANT
PAD DANNIFLEX CPM (ORTHOPEDIC SUPPLIES) ×3 IMPLANT
SAW OSC TIP CART 19.5X105X1.3 (SAW) ×3 IMPLANT
SET BASIN LINEN APH (SET/KITS/TRAYS/PACK) ×3 IMPLANT
SET HNDPC FAN SPRY TIP SCT (DISPOSABLE) ×1 IMPLANT
STAPLER VISISTAT 35W (STAPLE) ×3 IMPLANT
SUT BRALON NAB BRD #1 30IN (SUTURE) ×6 IMPLANT
SUT MNCRL 0 VIOLET CTX 36 (SUTURE) IMPLANT
SUT MON AB 0 CT1 (SUTURE) ×3 IMPLANT
SUT MONOCRYL 0 CTX 36 (SUTURE) ×2
SYR 20CC LL (SYRINGE) ×6 IMPLANT
SYR 30ML LL (SYRINGE) ×3 IMPLANT
SYR BULB IRRIGATION 50ML (SYRINGE) ×3 IMPLANT
TOWEL OR 17X26 4PK STRL BLUE (TOWEL DISPOSABLE) ×3 IMPLANT
TOWER CARTRIDGE SMART MIX (DISPOSABLE) ×2 IMPLANT
TRAY FOLEY CATH SILVER 16FR (SET/KITS/TRAYS/PACK) ×3 IMPLANT
WATER STERILE IRR 1000ML POUR (IV SOLUTION) ×8 IMPLANT
YANKAUER SUCT 12FT TUBE ARGYLE (SUCTIONS) ×3 IMPLANT

## 2015-02-23 NOTE — Anesthesia Procedure Notes (Signed)
Procedure Name: Intubation Date/Time: 02/23/2015 7:45 AM Performed by: Tressie Stalker E Pre-anesthesia Checklist: Patient identified, Patient being monitored, Timeout performed, Emergency Drugs available and Suction available Patient Re-evaluated:Patient Re-evaluated prior to inductionOxygen Delivery Method: Circle System Utilized Preoxygenation: Pre-oxygenation with 100% oxygen Intubation Type: IV induction Ventilation: Mask ventilation without difficulty Laryngoscope Size: Mac and 3 Grade View: Grade II Tube type: Oral Tube size: 7.0 mm Number of attempts: 1 Airway Equipment and Method: stylet Placement Confirmation: ETT inserted through vocal cords under direct vision,  positive ETCO2 and breath sounds checked- equal and bilateral Secured at: 21 cm Tube secured with: Tape Dental Injury: Teeth and Oropharynx as per pre-operative assessment  Comments: Patient felt tight during mask ventilation with oral airway, felt ETT would be safer for this longer procedure.

## 2015-02-23 NOTE — Brief Op Note (Signed)
02/23/2015  9:39 AM  PATIENT:  Sheri Jones  69 y.o. female  PRE-OPERATIVE DIAGNOSIS:  End Stage Arthritis, Right Knee  POST-OPERATIVE DIAGNOSIS:  End Stage Arthritis, Right Knee  PROCEDURE:  Procedure(s): RIGHT TOTAL KNEE ARTHROPLASTY (Right)   2.84F  2.5T  10 POLY PS 38 X 8.5 P    * Carole Civil, MD - Primary  PHYSICIAN ASSISTANT:   ASSISTANTS: BETTY ASHLEY AND CATHERINE PAGE    ANESTHESIA:   general  EBL:  Total I/O In: 500 [I.V.:500] Out: 0   BLOOD ADMINISTERED:none  DRAINS:1 HEMO0VAC IN JOINT   LOCAL MEDICATIONS USED:  MARCAINE   , Amount: W EPI 30 CC ml and OTHER EXPAREL 20   SPECIMEN:  No Specimen  DISPOSITION OF SPECIMEN:  N/A  COUNTS:  YES  TOURNIQUET:   Total Tourniquet Time Documented: Thigh (Right) - 85 minutes Total: Thigh (Right) - 85 minutes   DICTATION: .Viviann Spare Dictation  PLAN OF CARE: Admit to inpatient   PATIENT DISPOSITION:  PACU - hemodynamically stable.   Delay start of Pharmacological VTE agent (>24hrs) due to surgical blood loss or risk of bleeding: NO

## 2015-02-23 NOTE — Op Note (Signed)
02/23/2015  9:51 AM  PATIENT:  Sheri Jones  69 y.o. female  PRE-OPERATIVE DIAGNOSIS:  End Stage Arthritis, Right Knee  POST-OPERATIVE DIAGNOSIS:  End Stage Arthritis, Right Knee  IMPLANTS DEPUY SIGMA FIXED BEARING POSTERIOR STABILIZED  2.5 F  2.5 T 10 PS POLY  38 X 8.5 P  Operative report for right total knee arthroplasty  The patient was identified in the preoperative holding area using 2 methods of identification. The operative extremity was evaluated and found to be acceptable for surgery. The chart was reviewed. The surgical site was marked RIGHT KNEE  The patient was taken to the operating room for GENERAL anesthesia. Antibiotic per SCIP protocol was used. (ANCEF). A Foley catheter was inserted. The RIGHT KNEE AND  lower extremity was then prepped and draped for surgery using standard sterile technique.   The surgical team was gowned in [employer]"  The timeout was executed and the surgical site RIGHT  Knee confirmed. Implants were present. X-rays were reviewed and available.  The RIGHT lower extremity was exsanguinated with a six-inch Esmarch and the tourniquet was inflated to 300 mm of mercury. A standard midline incision was made. The incision was carried down to the extensor mechanism. A medial arthrotomy was performed and the patella was everted. The patellofemoral ligament was released.  The medial soft tissue sleeve was elevated to the mid coronal plane. The anterior cruciate ligament and PCL were resected along with the medial and lateral meniscus.  A 3/8  inch drill bit was used to open up the femoral canal which was decompressed with suction and  irrigation until the irrigation was clear. The distal resection was set for 11 mm 5 RIGHT  but to get a good resection of the lateral femoral condyle I increased the distal resection to 12 mm.  The distal resection guide was pinned in place and an oscillating saw was used to remove the distal 12 mm of bone.  The femur  sizing guide measured the femur to a size 2.5 . A size 2.5  4-in-1 cutting block was pinned to the distal femur; with ligament retractors in place the 4 femoral cuts were made.  The external alignment guide was secured to the tibia. The stylus was set for 4 mm resection from the lower side of the tibia, in this case lateral. Appropriate slope and rotation alignment was confirmed. Landmarks include the medial mid tibial tubercle junction, malleoli AND the second toe and tibial shaft. The proximal tibia was then resected. The tibia measured a size 2.5   Spacer blocks were placed and the knee balanced with a 10 mm spacer block.  The appropriate-sized femoral notch cutting guide was secured to the distal femur and the notch cut was completed.  Trial components were placed and range of motion was checked along with stability. The prosthesis was stable with the following components  Femur 2.5 Tibia 2.5 Spacer 10 mm   With the trial components in place any residual bone or osteophytes from the posterior condyles was removed with a curved osteotome.  The patella was skeletonized, measured for thickness found to be 20 mm and then the patella was resected using the cutting guide set at 14. The patella was then remeasured and had a final thickness of 12. It was then sized to a size 38 x 8.5. The holes were then drilled in the patella.  The tibial tray was then attached to the proximal tibia and prepared with reaming and punching.  ExParel 20 cc  was injected into the knee with specific attention paid to the posterior capsule  The knee was then irrigated while the cement was prepared. The prosthesis was then cemented into place  Excess cement was removed. The cement was allowed to cure under pressurization  Repeat irrigation and any residual cement fragments were removed  The knee was again checked for range of motion, patellar tracking and stability with the trial spacer in place. A polyethylene  insert was then placed using a size 10MM  Irrigation was performed again. A drain was placed. 40 cc of dilute EXPAREL was then injected.  Closure was performed with #1 Bralon for the extensor mechanism. 30 cc of Marcaine with epinephrine was injected into the joint followed by 0 Monocryl for the subcutaneous tissue.  Skin was reapproximated with skin staples.   A sterile bandage was applied along with a TED hose.  The patient taken recovery in stable condition

## 2015-02-23 NOTE — Plan of Care (Signed)
Problem: Acute Rehab PT Goals(only PT should resolve) Goal: Pt Will Go Supine/Side To Sit Pt will demonstrate supervision bed mobility supine to sitting edge-of-bed to return to PLOF and to decrease caregiver burden.     Goal: Patient Will Transfer Sit To/From Stand Pt will transfer sit to/from-stand with RW at supervision without loss-of-balance to demonstrate good safety awareness for independent mobility in home.     Goal: Pt Will Ambulate Pt will ambulate with RW at Supervision using a step-through pattern and equal step length for a distances greater than 257ft to demonstrate the ability to perform safe household distance ambulation at discharge.

## 2015-02-23 NOTE — Transfer of Care (Signed)
Immediate Anesthesia Transfer of Care Note  Patient: Sheri Jones  Procedure(s) Performed: Procedure(s): RIGHT TOTAL KNEE ARTHROPLASTY (Right)  Patient Location: PACU  Anesthesia Type:General  Level of Consciousness: awake and sedated  Airway & Oxygen Therapy: Patient Spontanous Breathing and Patient connected to face mask oxygen  Post-op Assessment: Report given to RN  Post vital signs: Reviewed and stable  Last Vitals:  Filed Vitals:   02/23/15 0656  BP: 145/70  Pulse: 66  Temp: 82.5 C    Complications: No apparent anesthesia complications

## 2015-02-23 NOTE — Anesthesia Preprocedure Evaluation (Addendum)
Anesthesia Evaluation  Patient identified by MRN, date of birth, ID band Patient awake    Reviewed: Allergy & Precautions, NPO status , Patient's Chart, lab work & pertinent test results  History of Anesthesia Complications Negative for: history of anesthetic complications  Airway Mallampati: II  TM Distance: >3 FB     Dental  (+) Missing,    Pulmonary    Pulmonary exam normal        Cardiovascular Exercise Tolerance: Good hypertension, Pt. on medications Normal cardiovascular exam     Neuro/Psych    GI/Hepatic   Endo/Other  diabetes, Type 2, Oral Hypoglycemic Agents  Renal/GU      Musculoskeletal   Abdominal (+) + obese,   Peds  Hematology   Anesthesia Other Findings   Reproductive/Obstetrics                            Anesthesia Physical Anesthesia Plan  ASA: III  Anesthesia Plan: General   Post-op Pain Management:    Induction: Intravenous  Airway Management Planned: Oral ETT  Additional Equipment:   Intra-op Plan:   Post-operative Plan: Extubation in OR  Informed Consent: I have reviewed the patients History and Physical, chart, labs and discussed the procedure including the risks, benefits and alternatives for the proposed anesthesia with the patient or authorized representative who has indicated his/her understanding and acceptance.   Dental advisory given  Plan Discussed with: CRNA  Anesthesia Plan Comments:        Anesthesia Quick Evaluation

## 2015-02-23 NOTE — Interval H&P Note (Signed)
History and Physical Interval Note:  02/23/2015 7:20 AM  Sheri Jones  has presented today for surgery, with the diagnosis of RIGHT KNEE OSTEOARTHRITIS  The various methods of treatment have been discussed with the patient and family. After consideration of risks, benefits and other options for treatment, the patient has consented to  Procedure(s): TOTAL KNEE ARTHROPLASTY (Right) as a surgical intervention .  The patient's history has been reviewed, patient examined, no change in status, stable for surgery.  I have reviewed the patient's chart and labs.  Questions were answered to the patient's satisfaction.     [name]

## 2015-02-23 NOTE — Evaluation (Signed)
Physical Therapy Evaluation Patient Details Name: Sheri Jones MRN: [id number] DOB: 06/07/1945 Today's Date: 02/23/2015   History of Present Illness  Pt is a 69yo black female who present s/p R TKA. Pt reports chronic decline in R knee function over time the last few years, but with minimal changes to activity tolerance, mostly pain.    Clinical Impression  Pt is received semirecumbent in bed upon entry, awake, drowsy, and willing to participate. No acute distress noted. Pt is A&Ox3 and pleasant.Pt reports independent mobility prior to surgery. Pt strength as assessed during functional activity and therex performance demonstrates mild-moderate weakness, mostly in the R quads, with strong compensation patterns in hip extensor and flexors. Patient presenting with impairment of strength, range of motion, and activity tolerance, limiting ability to perform ADL and mobility tasks at  baseline level of function. Patient will benefit from skilled intervention to address the above impairments and limitations, in order to restore to prior level of function, improve patient safety upon discharge, and to decrease falls risk.       Follow Up Recommendations Home health PT;Supervision/Assistance - 24 hour    Equipment Recommendations  Rolling walker with 5" wheels;3in1 (PT)    Recommendations for Other Services       Precautions / Restrictions Restrictions Weight Bearing Restrictions: Yes RLE Weight Bearing: Weight bearing as tolerated      Mobility  Bed Mobility               General bed mobility comments: defered at this time; patient is drowsy, with difficulty maintaining eyes open.   Transfers                    Ambulation/Gait                Stairs            Wheelchair Mobility    Modified Rankin (Stroke Patients Only)       Balance                                             Pertinent Vitals/Pain Pain Assessment: 0-10 Pain  Score: 8  Pain Location: Right Knee Pain Intervention(s): Limited activity within patient's tolerance;Premedicated before session;Monitored during session;Repositioned;Ice applied    Home Living Family/patient expects to be discharged to:: Private residence Living Arrangements: Alone Available Help at Discharge: Family;Available 24 hours/day (Niece) Type of Home: House Home Access:  (threshold only. )     Home Layout: One level Home Equipment: Cane - single point      Prior Function Level of Independence: Independent               Hand Dominance   Dominant Hand: Right    Extremity/Trunk Assessment               Lower Extremity Assessment: Overall WFL for tasks assessed;RLE deficits/detail RLE Deficits / Details: Presence of hip motor compensation for quads weakness, both with hip flexors and extensors.        Communication   Communication: No difficulties  Cognition Arousal/Alertness: Lethargic Behavior During Therapy: WFL for tasks assessed/performed Overall Cognitive Status: Within Functional Limits for tasks assessed                      General Comments      Exercises Total Joint Exercises Ankle  Circles/Pumps: AROM;Both;20 reps;Supine Quad Sets: Supine;AROM;Right;15 reps (largely becomes a glute set due to quads inhibition. ) Gluteal Sets: AROM;Right;15 reps;Supine (*see above) Short Arc Quad: AAROM;Right;15 reps;Supine (very limited ROM, weak MaxA for end-range) Heel Slides: AAROM;Right;15 reps;Supine Hip ABduction/ADduction: AAROM;Right;15 reps;Supine Straight Leg Raises: AAROM;Right;15 reps;Supine (significant extensor lag, and compenation from psoas. MaxA to perform. ) Goniometric ROM: 14-77 degree R knee flexion.       Assessment/Plan    PT Assessment Patient needs continued PT services  PT Diagnosis Generalized weakness;Acute pain   PT Problem List Decreased strength;Decreased range of motion;Decreased activity  tolerance;Decreased mobility;Decreased coordination;Pain  PT Treatment Interventions DME instruction;Gait training;Stair training;Functional mobility training;Therapeutic activities;Therapeutic exercise;Balance training;Patient/family education   PT Goals (Current goals can be found in the Care Plan section) Acute Rehab PT Goals Patient Stated Goal: Return to home, with care of family.  PT Goal Formulation: With patient Time For Goal Achievement: 03/09/15 Potential to Achieve Goals: Good    Frequency BID   Barriers to discharge        Co-evaluation               End of Session   Activity Tolerance: Patient tolerated treatment well;No increased pain;Patient limited by lethargy Patient left: in bed;with bed alarm set;with call bell/phone within reach;with family/visitor present;with SCD's reapplied Nurse Communication: Mobility status;Other (comment) (Wound vac needs to be reset, found with postiive pressure upon entry (mostly gas))         Time: 1400-1423 PT Time Calculation (min) (ACUTE ONLY): 23 min   Charges:   PT Evaluation $Initial PT Evaluation Tier I: 1 Procedure PT Treatments $Therapeutic Exercise: 8-22 mins   PT G Codes:        Buccola,Allan C Mar 02, 2015, 2:39 PM  2:42 PM  Etta Grandchild, PT, DPT [place] License # [id number]

## 2015-02-23 NOTE — Brief Op Note (Addendum)
02/23/2015  9:51 AM  PATIENT:  Sheri Jones  69 y.o. female  PRE-OPERATIVE DIAGNOSIS:  End Stage Arthritis, Right Knee  POST-OPERATIVE DIAGNOSIS:  End Stage Arthritis, Right Knee  IMPLANTS DEPUY SIGMA FIXED BEARING POSTERIOR STABILIZED  2.5 F  2.5 T 10 PS POLY  38 X 8.5 P  Operative report for right total knee arthroplasty  The patient was identified in the preoperative holding area using 2 methods of identification. The operative extremity was evaluated and found to be acceptable for surgery. The chart was reviewed. The surgical site was marked RIGHT KNEE  The patient was taken to the operating room for GENERAL anesthesia. Antibiotic per SCIP protocol was used. (ANCEF). A Foley catheter was inserted. The RIGHT KNEE AND  lower extremity was then prepped and draped for surgery using standard sterile technique.   The surgical team was gowned in [employer]"  The timeout was executed and the surgical site RIGHT  Knee confirmed. Implants were present. X-rays were reviewed and available.  The RIGHT lower extremity was exsanguinated with a six-inch Esmarch and the tourniquet was inflated to 300 mm of mercury. A standard midline incision was made. The incision was carried down to the extensor mechanism. A medial arthrotomy was performed and the patella was everted. The patellofemoral ligament was released.  The medial soft tissue sleeve was elevated to the mid coronal plane. The anterior cruciate ligament and PCL were resected along with the medial and lateral meniscus.  A 3/8  inch drill bit was used to open up the femoral canal which was decompressed with suction and  irrigation until the irrigation was clear. The distal resection was set for 11 mm 5 RIGHT  but to get a good resection of the lateral femoral condyle I increased the distal resection to 12 mm.  The distal resection guide was pinned in place and an oscillating saw was used to remove the distal 12 mm of bone.  The femur  sizing guide measured the femur to a size 2.5 . A size 2.5  4-in-1 cutting block was pinned to the distal femur; with ligament retractors in place the 4 femoral cuts were made.  The external alignment guide was secured to the tibia. The stylus was set for 4 mm resection from the lower side of the tibia, in this case lateral. Appropriate slope and rotation alignment was confirmed. Landmarks include the medial mid tibial tubercle junction, malleoli AND the second toe and tibial shaft. The proximal tibia was then resected. The tibia measured a size 2.5   Spacer blocks were placed and the knee balanced with a 10 mm spacer block.  The appropriate-sized femoral notch cutting guide was secured to the distal femur and the notch cut was completed.  Trial components were placed and range of motion was checked along with stability. The prosthesis was stable with the following components  Femur 2.5 Tibia 2.5 Spacer 10 mm   With the trial components in place any residual bone or osteophytes from the posterior condyles was removed with a curved osteotome.  The patella was skeletonized, measured for thickness found to be 20 mm and then the patella was resected using the cutting guide set at 14. The patella was then remeasured and had a final thickness of 12. It was then sized to a size 38 x 8.5. The holes were then drilled in the patella.  The tibial tray was then attached to the proximal tibia and prepared with reaming and punching.  ExParel 20 cc  was injected into the knee with specific attention paid to the posterior capsule  The knee was then irrigated while the cement was prepared. The prosthesis was then cemented into place  Excess cement was removed. The cement was allowed to cure under pressurization  Repeat irrigation and any residual cement fragments were removed  The knee was again checked for range of motion, patellar tracking and stability with the trial spacer in place. A polyethylene  insert was then placed using a size 10MM  Irrigation was performed again. A drain was placed. 40 cc of dilute EXPAREL was then injected.  Closure was performed with #1 Bralon for the extensor mechanism. 30 cc of Marcaine with epinephrine was injected into the joint followed by 0 Monocryl for the subcutaneous tissue.  Skin was reapproximated with skin staples.   A sterile bandage was applied along with a TED hose.  The patient taken recovery in stable condition

## 2015-02-23 NOTE — Anesthesia Postprocedure Evaluation (Signed)
  Anesthesia Post-op Note  Patient: Sheri Jones  Procedure(s) Performed: Procedure(s): RIGHT TOTAL KNEE ARTHROPLASTY (Right)  Patient Location: PACU  Anesthesia Type:General  Level of Consciousness: awake, alert  and oriented  Airway and Oxygen Therapy: Patient Spontanous Breathing  Post-op Pain: mild  Post-op Assessment: Post-op Vital signs reviewed, Patient's Cardiovascular Status Stable, Respiratory Function Stable, Patent Airway, No signs of Nausea or vomiting and Adequate PO intake              Post-op Vital Signs: Reviewed and stable  Last Vitals:  Filed Vitals:   02/23/15 1015  BP: 132/77  Pulse: 75  Temp:   Resp: 10    Complications: No apparent anesthesia complications

## 2015-02-23 NOTE — Care Management Note (Signed)
Case Management Note  Patient Details  Name: Sheri Jones MRN: [id number] Date of Birth: [birth year]  Subjective/Objective:                  Pt post knee repair today. Pt lives alone but has strong family support.   Action/Plan: Pt plans to return home with [facility] services. Anticipate pt will need CPM, RW, 3 in 1. Pt request [facility] for DME needs. Will cont to follow.   Expected Discharge Date:   02/26/2015               Expected Discharge Plan:  [place]  In-House Referral:  NA  Discharge planning Services  CM Consult  Post Acute Care Choice:  Home Health, Durable Medical Equipment Choice offered to:  Patient  DME Arranged:    DME Agency:  [place]:  PT [facility] Agency:  [place]  Status of Service:  In process, will continue to follow  Medicare Important Message Given:    Date Medicare IM Given:    Medicare IM give by:    Date Additional Medicare IM Given:    Additional Medicare Important Message give by:     If discussed at [place] of Stay Meetings, dates discussed:    Additional Comments:  Sherald Barge, RN 02/23/2015, 3:26 PM

## 2015-02-24 ENCOUNTER — Encounter (HOSPITAL_COMMUNITY): Payer: Self-pay | Admitting: Orthopedic Surgery

## 2015-02-24 LAB — CBC
HCT: 33.9 % — ABNORMAL LOW (ref 36.0–46.0)
HEMOGLOBIN: 11.4 g/dL — AB (ref 12.0–15.0)
MCH: 31.3 pg (ref 26.0–34.0)
MCHC: 33.6 g/dL (ref 30.0–36.0)
MCV: 93.1 fL (ref 78.0–100.0)
Platelets: 129 10*3/uL — ABNORMAL LOW (ref 150–400)
RBC: 3.64 MIL/uL — AB (ref 3.87–5.11)
RDW: 14.8 % (ref 11.5–15.5)
WBC: 5.9 10*3/uL (ref 4.0–10.5)

## 2015-02-24 LAB — BASIC METABOLIC PANEL
Anion gap: 7 (ref 5–15)
BUN: 8 mg/dL (ref 6–20)
CHLORIDE: 103 mmol/L (ref 101–111)
CO2: 26 mmol/L (ref 22–32)
CREATININE: 0.8 mg/dL (ref 0.44–1.00)
Calcium: 8.6 mg/dL — ABNORMAL LOW (ref 8.9–10.3)
Glucose, Bld: 118 mg/dL — ABNORMAL HIGH (ref 65–99)
Potassium: 3.7 mmol/L (ref 3.5–5.1)
SODIUM: 136 mmol/L (ref 135–145)

## 2015-02-24 LAB — GLUCOSE, CAPILLARY
GLUCOSE-CAPILLARY: 126 mg/dL — AB (ref 65–99)
Glucose-Capillary: 147 mg/dL — ABNORMAL HIGH (ref 65–99)

## 2015-02-24 NOTE — Progress Notes (Signed)
Postoperative 1 status post right total knee  BP 137/62 mmHg  Pulse 78  Temp(Src) 97.4 F (36.3 C) (Axillary)  Resp 20  Ht 5\' 3"  (1.6 m)  SpO2 100%  Patient is doing well stable well-controlled pain  Neurovascular motor exam intact   Progressive physical therapy as tolerated

## 2015-02-24 NOTE — Anesthesia Postprocedure Evaluation (Signed)
  Anesthesia Post-op Note  Patient: Sheri Jones  Procedure(s) Performed: Procedure(s): RIGHT TOTAL KNEE ARTHROPLASTY (Right)  Patient Location: Room 320  Anesthesia Type:General  Level of Consciousness: awake and patient cooperative  Airway and Oxygen Therapy: Patient Spontanous Breathing  Post-op Pain: 3 /10, mild  Post-op Assessment: Post-op Vital signs reviewed, Patient's Cardiovascular Status Stable, Respiratory Function Stable, Patent Airway, No signs of Nausea or vomiting and Pain level controlled              Post-op Vital Signs: Reviewed and stable  Last Vitals:  Filed Vitals:   02/24/15 0608  BP: 137/62  Pulse: 78  Temp: 36.3 C  Resp: 20    Complications: No apparent anesthesia complications

## 2015-02-24 NOTE — Evaluation (Signed)
Occupational Therapy Evaluation Patient Details Name: Sheri Jones MRN: [id number] DOB: 11-28-1945 Today's Date: 02/24/2015    History of Present Illness Pt is a 69yo black female who present s/p R TKA. Pt reports chronic decline in R knee function over time the last few years, but with minimal changes to activity tolerance, mostly pain.     Clinical Impression   Pt awake, alert, and oriented this am. Pt is doing very well this am, great attitude. Pt mod independent in bed mobility, min assist for sit to stand. Pt required min guard for functional mobility to bathroom, min guard for toilet transfer. Pt reports she will have niece present to assist with ADL tasks on discharge. Pt plans on HHPT. No further acute OT needs at this time.     Follow Up Recommendations  No OT follow up;Supervision/Assistance - 24 hour    Equipment Recommendations  None recommended by OT       Precautions / Restrictions Restrictions RLE Weight Bearing: Weight bearing as tolerated      Mobility Bed Mobility Overal bed mobility: Modified Independent                Transfers Overall transfer level: Needs assistance Equipment used: Rolling walker (2 wheeled) Transfers: Sit to/from Stand Sit to Stand: Min assist                   ADL Overall ADL's : Needs assistance/impaired                     Lower Body Dressing: Maximal assistance;Bed level   Toilet Transfer: Min guard;Grab bars;RW   Toileting- Clothing Manipulation and Hygiene: Modified independent;Sit to/from stand       Functional mobility during ADLs: Min guard;Cueing for safety;Rolling walker       Vision Vision Assessment?: No apparent visual deficits          Pertinent Vitals/Pain Pain Assessment: 0-10 Pain Score: 7  Pain Location: right knee Pain Descriptors / Indicators: Aching Pain Intervention(s): Limited activity within patient's tolerance;Monitored during session     Hand Dominance Right    Extremity/Trunk Assessment Upper Extremity Assessment Upper Extremity Assessment: Overall WFL for tasks assessed   Lower Extremity Assessment Lower Extremity Assessment: Defer to PT evaluation       Communication Communication Communication: No difficulties   Cognition Arousal/Alertness: Awake/alert Behavior During Therapy: WFL for tasks assessed/performed Overall Cognitive Status: Within Functional Limits for tasks assessed                                Home Living Family/patient expects to be discharged to:: Private residence Living Arrangements: Alone Available Help at Discharge: Family;Available 24 hours/day (niece)               Bathroom Shower/Tub: Teacher, early years/pre: Handicapped height     Home Equipment: Cane - single point;Tub bench          Prior Functioning/Environment Level of Independence: Independent             OT Diagnosis: Acute pain   OT Problem List: Pain    End of Session Equipment Utilized During Treatment: Gait belt;Rolling walker  Activity Tolerance: Patient tolerated treatment well Patient left: in chair;with call bell/phone within reach;with nursing/sitter in room   Time: 9937-1696 OT Time Calculation (min): 28 min Charges:  OT General Charges $OT Visit: 1 Procedure OT Evaluation $Initial  OT Evaluation Tier I: 1 Procedure  Guadelupe Sabin, OTR/L  [phone]  02/24/2015, 10:49 AM

## 2015-02-24 NOTE — Progress Notes (Signed)
Physical Therapy Treatment Patient Details Name: Sheri Jones MRN: [id number] DOB: [birth year] Today's Date: 02/24/2015    History of Present Illness Pt is a 69yo black female who present s/p R TKA. Pt reports chronic decline in R knee function over time the last few years, but with minimal changes to activity tolerance, mostly pain.      PT Comments    Pt was found up in the recliner chair feeling very well, no pain.  She has moderate edema in the right knee and the use of ice was reinforced.  She was able to tolerate therapeutic exercise per protocol to the right knee with 20-90* active assisted ROM.  The importance of developing increased extension was stressed and she was shown bridging technique.  She was able to ambulate 40' with a walker, stable gait pattern.  Ice was reapplied at end of session.  She was instructed in knee precautions and we initiated instruction in the operation of the cryovac.  Follow Up Recommendations  Home health PT;Supervision/Assistance - 24 hour     Equipment Recommendations  Rolling walker with 5" wheels;3in1 (PT)    Recommendations for Other Services  none     Precautions / Restrictions Precautions Precautions: Fall Precaution Booklet Issued: Yes (comment) Restrictions Weight Bearing Restrictions: No RLE Weight Bearing: Weight bearing as tolerated    Mobility  Bed Mobility Overal bed mobility: Modified Independent                Transfers Overall transfer level: Needs assistance Equipment used: Rolling walker (2 wheeled) Transfers: Sit to/from Stand Sit to Stand: Min assist         General transfer comment: instructed in correct hand placement  Ambulation/Gait Ambulation/Gait assistance: Supervision Ambulation Distance (Feet): 70 Feet Assistive device: Rolling walker (2 wheeled) Gait Pattern/deviations: Decreased dorsiflexion - right Gait velocity: appropriate for situation Gait velocity interpretation: <1.8 ft/sec,  indicative of risk for recurrent falls General Gait Details: tends to take too large a step with the right LE but is able to correct for this with cues   Stairs            Wheelchair Mobility    Modified Rankin (Stroke Patients Only)       Balance Overall balance assessment: No apparent balance deficits (not formally assessed)                                  Cognition Arousal/Alertness: Awake/alert Behavior During Therapy: WFL for tasks assessed/performed Overall Cognitive Status: Within Functional Limits for tasks assessed                      Exercises Total Joint Exercises Ankle Circles/Pumps: AROM;Both;20 reps;Seated Quad Sets: AROM;Right;15 reps;Seated Short Arc Quad: AAROM;Right;15 reps;Seated Heel Slides: AAROM;Right;15 reps;Seated Goniometric ROM: 20-90*, AA right knee    General Comments        Pertinent Vitals/Pain Pain Assessment: No/denies pain Pain Score: 7  Pain Location: right knee Pain Descriptors / Indicators: Aching Pain Intervention(s): Premedicated before session    Home Living Family/patient expects to be discharged to:: Private residence Living Arrangements: Alone Available Help at Discharge: Family;Available 24 hours/day Type of Home: House     Home Layout: One level Home Equipment: Cane - single point;Tub bench      Prior Function Level of Independence: Independent          PT Goals (current goals can now be  found in the care plan section) Progress towards PT goals: Progressing toward goals    Frequency  BID    PT Plan Current plan remains appropriate    Co-evaluation             End of Session Equipment Utilized During Treatment: Gait belt Activity Tolerance: Patient tolerated treatment well;No increased pain Patient left: in chair;with call bell/phone within reach;with family/visitor present     Time: 9476-5465 PT Time Calculation (min) (ACUTE ONLY): 42 min  Charges:  $Gait  Training: 8-22 mins $Therapeutic Exercise: 8-22 mins $Self Care/Home Management: 8-22                    G CodesSable Feil  PT 02/24/2015, 12:33 PM [phone]

## 2015-02-24 NOTE — Care Management Note (Signed)
Case Management Note  Patient Details  Name: Sheri Jones MRN: [id number] Date of Birth: Sep 08, 1945  Subjective/Objective:                  Pt's CPM has been ordered by Dr. Ruthe Mannan office through medical modalities and will be delivered to pt's home at DC. Pt has agreed to [facility] services through [facility]. [facility] Go rep has visited with pt and will obtain pt info from chart. Pt will need RW and 3 in 1 and pt would like those from [facility]. Romualdo Bolk of [facility] is aware of referral and will obtain info from chart and have DME delivered to pt room prior to DC.   Action/Plan: Will cont to follow.   Expected Discharge Date:                  Expected Discharge Plan:  [place]  In-House Referral:  NA  Discharge planning Services  CM Consult  Post Acute Care Choice:  Home Health, Durable Medical Equipment Choice offered to:  Patient  DME Arranged:  3-N-1, Walker rolling DME Agency:  [place]:  PT [place]:  [place]  Status of Service:  In process, will continue to follow  Medicare Important Message Given:    Date Medicare IM Given:    Medicare IM give by:    Date Additional Medicare IM Given:    Additional Medicare Important Message give by:     If discussed at [place] of Stay Meetings, dates discussed:    Additional Comments:  Sherald Barge, RN 02/24/2015, 4:25 PM

## 2015-02-24 NOTE — Progress Notes (Signed)
Physical Therapy Treatment Patient Details Name: Sheri Jones MRN: [id number] DOB: 1945-10-14 Today's Date: 02/24/2015    History of Present Illness Pt is a 69yo black female who present s/p R TKA. Pt reports chronic decline in R knee function over time the last few years, but with minimal changes to activity tolerance, mostly pain.      PT Comments    Pt awake and willing to participate, stated somewhat drowsy from pain medication given prior therapy but able to keep eyes open and respond appropriate to all questions asked.  Session focus on Rt knee ROM and strengthening, transfer and gait training.  Pt required verbal cueing for correct musculature activation with quad sets and heel slides.  AAROM 18-92 degrees today.  Transfer training with min cueing required for hand position prior sit to stand from chair and toilet.  Stable balance with no HHA during hand washing with no LOB episodes.  Increased distance with gait training with cueing to increase stance phase Rt LE WBAT, increase step length with Lt LE and improve heel to toe pattern.  Upon return to room pt left in bed with CPM set for 0-60* and time for stop written on dry erase board.  Ice was applied to knee and pt positioned in semirecumbent for comfort.  Call bell was placed within reach.  No reports of increased pain through session.    Follow Up Recommendations  Home health PT;Supervision/Assistance - 24 hour     Equipment Recommendations  Rolling walker with 5" wheels;3in1 (PT)    Recommendations for Other Services       Precautions / Restrictions Precautions Precautions: Fall Precaution Booklet Issued: Yes (comment) Precaution Comments: Knee replacement book issued Restrictions Weight Bearing Restrictions: No RLE Weight Bearing: Weight bearing as tolerated    Mobility  Bed Mobility Overal bed mobility: Modified Independent                Transfers Overall transfer level: Needs assistance Equipment used:  Rolling walker (2 wheeled) Transfers: Sit to/from Stand Sit to Stand: Min assist         General transfer comment: Cueing for proper hand placement  Ambulation/Gait Ambulation/Gait assistance: Supervision Ambulation Distance (Feet): 85 Feet Assistive device: Rolling walker (2 wheeled) Gait Pattern/deviations: Decreased dorsiflexion - right;Decreased stance time - right;Decreased step length - left Gait velocity: appropriate for situation Gait velocity interpretation: <1.8 ft/sec, indicative of risk for recurrent falls General Gait Details: tends to take too large a step with the right LE but is able to correct for this with cues   Stairs            Wheelchair Mobility    Modified Rankin (Stroke Patients Only)       Balance Overall balance assessment: No apparent balance deficits (not formally assessed)                                  Cognition Arousal/Alertness: Awake/alert Behavior During Therapy: WFL for tasks assessed/performed Overall Cognitive Status: Within Functional Limits for tasks assessed                      Exercises Total Joint Exercises Ankle Circles/Pumps: AROM;Both;20 reps;Seated Quad Sets: AROM;Right;15 reps;Seated Gluteal Sets: AROM;Right;15 reps;Supine Short Arc Quad: AAROM;Right;15 reps;Seated Heel Slides: AAROM;Right;15 reps;Seated Hip ABduction/ADduction: AAROM;Right;15 reps;Supine Goniometric ROM: 18-92*, AAROM Rt knee    General Comments  Pertinent Vitals/Pain Pain Assessment: No/denies pain Pain Intervention(s): Premedicated before session;Limited activity within patient's tolerance;Monitored during session    [place] expects to be discharged to:: Private residence Living Arrangements: Alone Available Help at Discharge: Family;Available 24 hours/day Type of Home: House     Home Layout: One level Home Equipment: Cane - single point;Tub bench      Prior Function Level of  Independence: Independent          PT Goals (current goals can now be found in the care plan section) Progress towards PT goals: Progressing toward goals    Frequency  BID    PT Plan Current plan remains appropriate    Co-evaluation             End of Session Equipment Utilized During Treatment: Gait belt Activity Tolerance: Patient tolerated treatment well;No increased pain Patient left: in bed;in CPM     Time: 1320-1403 PT Time Calculation (min) (ACUTE ONLY): 43 min  Charges:  $Gait Training: 8-22 mins $Therapeutic Exercise: 8-22 mins $Therapeutic Activity: 8-22 mins $Self Care/Home Management: Dec 18, 2022                    G Codes:      Aldona Lento 02/24/2015, 2:55 PM

## 2015-02-24 NOTE — Clinical Social Work Note (Signed)
CSW received consult for possible SNF. PT has evaluated pt and pt plans to return home with home health. CSW will sign off, but can be reconsulted if needed.   Benay Pike, [place]

## 2015-02-24 NOTE — Addendum Note (Signed)
Addendum  created 02/24/15 1450 by Charmaine Downs, CRNA   Modules edited: Notes Section   Notes Section:  File: [id number]

## 2015-02-25 LAB — GLUCOSE, CAPILLARY
GLUCOSE-CAPILLARY: 84 mg/dL (ref 65–99)
GLUCOSE-CAPILLARY: 104 mg/dL — AB (ref 65–99)
Glucose-Capillary: 102 mg/dL — ABNORMAL HIGH (ref 65–99)

## 2015-02-25 LAB — CBC
HCT: 29.1 % — ABNORMAL LOW (ref 36.0–46.0)
Hemoglobin: 10 g/dL — ABNORMAL LOW (ref 12.0–15.0)
MCH: 31.3 pg (ref 26.0–34.0)
MCHC: 34.4 g/dL (ref 30.0–36.0)
MCV: 91.2 fL (ref 78.0–100.0)
PLATELETS: 139 10*3/uL — AB (ref 150–400)
RBC: 3.19 MIL/uL — ABNORMAL LOW (ref 3.87–5.11)
RDW: 14.8 % (ref 11.5–15.5)
WBC: 6.4 10*3/uL (ref 4.0–10.5)

## 2015-02-25 NOTE — Care Management Important Message (Signed)
Important Message  Patient Details  Name: Sheri Jones MRN: [id number] Date of Birth: [birth year]   Medicare Important Message Given:  Yes-second notification given    Sherald Barge, RN 02/25/2015, 4:13 PM

## 2015-02-25 NOTE — Progress Notes (Signed)
Physical Therapy Treatment Patient Details Name: Sheri Jones MRN: [id number] DOB: 04/23/1946 Today's Date: 02/25/2015    History of Present Illness Pt is a 69yo black female who present s/p R TKA. Pt reports chronic decline in R knee function over time the last few years, but with minimal changes to activity tolerance, mostly pain.      PT Comments    Pt was seen for 2nd visit today.  She tolerated sitting in recliner with no difficulty.  She is now independent in all transfers, ROM of right knee =12-95* AA.  She is able to ambulate 150' with a walker excellent gait pattern.  She was placed in the CPM set at 0-70* which was tolerated well.  Her TED stockings were changed to correct size and they are now more comfortable.  I spoke with RN and asked her to order the knee immobilizer and asked her to place it on the RLE at bedtime.  All home instructions were reviewed with pt as well as operation of the cryocuff.  She should transition well to home.  All PT goals have been met.  Follow Up Recommendations  Home health PT;Supervision/Assistance - 24 hour     Equipment Recommendations  Rolling walker with 5" wheels;3in1 (PT)    Recommendations for Other Services   none    Precautions / Restrictions Precautions Precautions: None Precaution Booklet Issued: Yes (comment) Precaution Comments: Knee replacement book issued Restrictions Weight Bearing Restrictions: No RLE Weight Bearing: Weight bearing as tolerated    Mobility  Bed Mobility Overal bed mobility: Independent                Transfers Overall transfer level: Independent Equipment used: Rolling walker (2 wheeled) Transfers: Sit to/from Stand Sit to Stand: Independent            Ambulation/Gait Ambulation/Gait assistance: Modified independent (Device/Increase time) Ambulation Distance (Feet): 150 Feet Assistive device: Rolling walker (2 wheeled) Gait Pattern/deviations: WFL(Within Functional Limits) Gait  velocity: appropriate for situation Gait velocity interpretation: >2.62 ft/sec, indicative of independent community ambulator General Gait Details: very good gait pattern today, nearly full weight bearing RLE   Stairs Stairs: Yes Stairs assistance: Supervision Stair Management: One rail Right;Sideways Number of Stairs: 10    Wheelchair Mobility    Modified Rankin (Stroke Patients Only)       Balance Overall balance assessment: No apparent balance deficits (not formally assessed)                                  Cognition Arousal/Alertness: Awake/alert Behavior During Therapy: WFL for tasks assessed/performed Overall Cognitive Status: Within Functional Limits for tasks assessed                      Exercises Total Joint Exercises Ankle Circles/Pumps: AROM;Both;20 reps;Seated Quad Sets: AROM;Right;15 reps;Seated Short Arc Quad: AAROM;Right;15 reps;Seated Heel Slides: AAROM;Right;15 reps;Seated Straight Leg Raises: AAROM;Right;15 reps;Supine Long Arc Quad: AROM;Right;10 reps;Seated Knee Flexion: AROM;AAROM;Right;10 reps;Seated Goniometric ROM: 12-95* right knee    General Comments        Pertinent Vitals/Pain Pain Assessment: No/denies pain    Home Living                      Prior Function            PT Goals (current goals can now be found in the care plan section) Progress towards PT goals:  Goals met/education completed, patient discharged from PT    Frequency  BID    PT Plan Current plan remains appropriate    Co-evaluation             End of Session Equipment Utilized During Treatment: Gait belt Activity Tolerance: Patient tolerated treatment well;No increased pain Patient left: in bed;in CPM     Time: 1311-1404 PT Time Calculation (min) (ACUTE ONLY): 53 min  Charges:  $Gait Training: 8-22 mins $Therapeutic Exercise: 8-22 mins $Self Care/Home Management: 8-22                    G CodesDemetrios Isaacs L  PT 02/25/2015, 3:01 PM [phone]

## 2015-02-25 NOTE — Progress Notes (Signed)
Postop day 2 status post right total knee patient had some nausea when she was exercising on the stairs she has not had a bowel movement  Pain is well-controlled  BP 109/64 mmHg  Pulse 75  Temp(Src) 97.9 F (36.6 C) (Oral)  Resp 20  Ht 5\' 3"  (1.6 m)  SpO2 100%  I pulled her Hemovac drain out.  She will go home in the morning  I would like to see a knee brace on her at night to help with her extension.

## 2015-02-25 NOTE — Care Management Note (Signed)
Case Management Note  Patient Details  Name: PAIZLIE KLAUS MRN: [id number] Date of Birth: [birth year]  Subjective/Objective:                  [place] home over weekend. DME has been delivered to room and CPM will be delivered to pt's home at DC.   Action/Plan: Weekend RN to notify [facility] Go of when pt DC's. No further CM needs noted.   Expected Discharge Date:     02/26/2015             Expected Discharge Plan:  [place]  In-House Referral:  NA  Discharge planning Services  CM Consult  Post Acute Care Choice:  Home Health, Durable Medical Equipment Choice offered to:  Patient  DME Arranged:  3-N-1, Walker rolling DME Agency:  [place]:  PT [place]:  [place]  Status of Service:  completed  Medicare Important Message Given:  Yes-second notification given Date Medicare IM Given:    Medicare IM give by:    Date Additional Medicare IM Given:    Additional Medicare Important Message give by:     If discussed at [place] of Stay Meetings, dates discussed:    Additional Comments:  Sherald Barge, RN 02/25/2015, 4:13 PM

## 2015-02-25 NOTE — Progress Notes (Signed)
Physical Therapy Treatment Patient Details Name: Sheri Jones MRN: [id number] DOB: 1945-12-04 Today's Date: 02/25/2015    History of Present Illness Pt is a 69yo black female who present s/p R TKA. Pt reports chronic decline in R knee function over time the last few years, but with minimal changes to activity tolerance, mostly pain.      PT Comments    Pt is progressing very well.  She has minimal pain in the knee, knee ROM is 15-95* AA.  She is able to ambulate 150' with a walker and excellent gait pattern and was able to ascend/descend a flight of steps with supervision.  She is having increased oozing of blood from the hemovac insertion site.  She is hoping to go home tomorrow when her family member will be able to stay with her.  Follow Up Recommendations  Home health PT;Supervision/Assistance - 24 hour     Equipment Recommendations  Rolling walker with 5" wheels;3in1 (PT)    Recommendations for Other Services  none     Precautions / Restrictions Precautions Precautions: Fall Precaution Booklet Issued: Yes (comment) Precaution Comments: Knee replacement book issued Restrictions Weight Bearing Restrictions: No RLE Weight Bearing: Weight bearing as tolerated    Mobility  Bed Mobility                  Transfers Overall transfer level: Modified independent Equipment used: Rolling walker (2 wheeled) Transfers: Sit to/from Stand              Ambulation/Gait Ambulation/Gait assistance: Modified independent (Device/Increase time) Ambulation Distance (Feet): 150 Feet Assistive device: Rolling walker (2 wheeled) Gait Pattern/deviations: WFL(Within Functional Limits)   Gait velocity interpretation: >2.62 ft/sec, indicative of independent community ambulator General Gait Details: very good gait pattern today, nearly full weight bearing RLE   Stairs Stairs: Yes Stairs assistance: Supervision Stair Management: One rail Right;Sideways Number of Stairs: 10     Wheelchair Mobility    Modified Rankin (Stroke Patients Only)       Balance Overall balance assessment: No apparent balance deficits (not formally assessed)                                  Cognition Arousal/Alertness: Awake/alert Behavior During Therapy: WFL for tasks assessed/performed Overall Cognitive Status: Within Functional Limits for tasks assessed                      Exercises Total Joint Exercises Ankle Circles/Pumps: AROM;Both;20 reps;Seated Quad Sets: AROM;Right;15 reps;Seated Long Arc Quad: AROM;Right;10 reps;Seated Knee Flexion: AROM;AAROM;Right;10 reps;Seated Goniometric ROM: 15-95* right knee    General Comments        Pertinent Vitals/Pain Pain Assessment: No/denies pain    Home Living                      Prior Function            PT Goals (current goals can now be found in the care plan section) Progress towards PT goals: Progressing toward goals    Frequency  BID    PT Plan Current plan remains appropriate    Co-evaluation             End of Session Equipment Utilized During Treatment: Gait belt Activity Tolerance: Patient tolerated treatment well;No increased pain Patient left: in chair;with call bell/phone within reach     Time: 0942-1017 PT Time Calculation (min) (ACUTE  ONLY): 35 min  Charges:  $Gait Training: 8-22 mins $Therapeutic Exercise: 8-22 mins                    G Codes:      Sable Feil  PT 02/25/2015, 11:12 AM [phone]

## 2015-02-25 NOTE — Progress Notes (Signed)
Pt's IV has disconnected for the 4th time during  previous shift. Md informed and advised this nurse to leave it out since pt is to be discharged tomorrow morning.

## 2015-02-26 LAB — CBC
HCT: 28.8 % — ABNORMAL LOW (ref 36.0–46.0)
Hemoglobin: 10.1 g/dL — ABNORMAL LOW (ref 12.0–15.0)
MCH: 32.4 pg (ref 26.0–34.0)
MCHC: 35.1 g/dL (ref 30.0–36.0)
MCV: 92.3 fL (ref 78.0–100.0)
PLATELETS: 126 10*3/uL — AB (ref 150–400)
RBC: 3.12 MIL/uL — AB (ref 3.87–5.11)
RDW: 14.5 % (ref 11.5–15.5)
WBC: 5.4 10*3/uL (ref 4.0–10.5)

## 2015-02-26 LAB — GLUCOSE, CAPILLARY
GLUCOSE-CAPILLARY: 80 mg/dL (ref 65–99)
Glucose-Capillary: 92 mg/dL (ref 65–99)

## 2015-02-26 MED ORDER — POLYETHYLENE GLYCOL 3350 17 G PO PACK: 17.0000 g | PACK | Freq: Every day | ORAL | Status: DC

## 2015-02-26 MED ORDER — HYDROCODONE-ACETAMINOPHEN 10-325 MG PO TABS: 1.0000 | ORAL_TABLET | ORAL | Status: DC

## 2015-02-26 MED ORDER — ASPIRIN 325 MG PO TBEC: 325.0000 mg | DELAYED_RELEASE_TABLET | Freq: Every day | ORAL | Status: DC

## 2015-02-26 MED ORDER — DOCUSATE SODIUM 100 MG PO CAPS: 100.0000 mg | ORAL_CAPSULE | Freq: Two times a day (BID) | ORAL | Status: DC

## 2015-02-26 NOTE — Discharge Summary (Signed)
Physician Discharge Summary  Patient ID: Sheri Jones MRN: [id number] DOB/AGE: [birth year] 69 y.o.  Admit date: 02/23/2015 Discharge date: 02/26/2015  Admission Diagnoses: Primary osteoarthritis right knee  Discharge Diagnoses: Primary osteoarthritis right knee Active Problems:   Primary osteoarthritis of right knee   Discharged Condition: stable  Hospital Course:  02/24/2015 patient was admitted had uncomplicated right total knee surgery with the [employer] posterior stabilized implant size 2.5 femur and tibia and a 10 polyethylene insert with a resurfaced patella. Done under general anesthesia and no complications  On 32/9 the patient progressed well with physical therapy  On 11 5 she was afebrile her incision had scant drainage her pain was controlled she was ambulating more than 100 feet and she was discharged home       Discharge Exam: Blood pressure 125/64, pulse 81, temperature 98.2 F (36.8 C), temperature source Oral, resp. rate 20, height 5\' 3"  (1.6 m), SpO2 97 %.   Disposition: 01-Home or Self Care  Discharge Instructions    CPM    Complete by:  As directed   Continuous passive motion machine (CPM):      Use the CPM from 0 to 75 for 6 hours per day.      You may increase by 10 per day.  You may break it up into 2 or 3 sessions per day.      Use CPM for 3 weeks or until you are told to stop.     Call MD / Call 911    Complete by:  As directed   If you experience chest pain or shortness of breath, CALL 911 and be transported to the hospital emergency room.  If you develope a fever above 101 F, pus (white drainage) or increased drainage or redness at the wound, or calf pain, call your surgeon's office.     Change dressing    Complete by:  As directed   Change dressing on 11/10, then change the dressing daily with sterile 4 x 4 inch gauze dressing and apply TED hose.  You may clean the incision with alcohol prior to redressing.     Constipation Prevention     Complete by:  As directed   Drink plenty of fluids.  Prune juice may be helpful.  You may use a stool softener, such as Colace (over the counter) 100 mg twice a day.  Use MiraLax (over the counter) for constipation as needed.     Diet - low sodium heart healthy    Complete by:  As directed      Do not put a pillow under the knee. Place it under the heel.    Complete by:  As directed      Driving restrictions    Complete by:  As directed   No driving for 4 weeks     Increase activity slowly as tolerated    Complete by:  As directed      TED hose    Complete by:  As directed   Use stockings (TED hose) for 2 weeks on both leg(s).  You may remove them at night for sleeping.            Medication List    STOP taking these medications        aspirin 81 MG tablet  Replaced by:  aspirin 325 MG EC tablet     diclofenac 50 MG EC tablet  Commonly known as:  VOLTAREN     traMADol 50 MG tablet  Commonly known as:  ULTRAM     TYLENOL 500 MG tablet  Generic drug:  acetaminophen      TAKE these medications        aspirin 325 MG EC tablet  Take 1 tablet (325 mg total) by mouth daily with breakfast.     Calcium-Vitamin D 500-125 MG-UNIT Tabs  Take by mouth 3 (three) times daily.     CENTRUM ULTRA WOMENS PO  Take 1 tablet by mouth daily.     cetirizine 10 MG tablet  Commonly known as:  ZYRTEC  Take 10 mg by mouth daily.     cyanocobalamin 1000 MCG/ML injection  Commonly known as:  (VITAMIN B-12)  Inject 1,000 mcg into the muscle every 30 (thirty) days.     docusate sodium 100 MG capsule  Commonly known as:  COLACE  Take 1 capsule (100 mg total) by mouth 2 (two) times daily.     hydrochlorothiazide 25 MG tablet  Commonly known as:  HYDRODIURIL  TAKE 1 TABLET BY MOUTH EVERY DAY     HYDROcodone-acetaminophen 10-325 MG tablet  Commonly known as:  NORCO  Take 1 tablet by mouth every 4 (four) hours.     metFORMIN 500 MG tablet  Commonly known as:  GLUCOPHAGE  TAKE 1 TABLET  (500 MG TOTAL) BY MOUTH DAILY WITH BREAKFAST.     polyethylene glycol packet  Commonly known as:  MIRALAX / GLYCOLAX  Take 17 g by mouth daily.     potassium chloride 10 MEQ tablet  Commonly known as:  KLOR-CON 10  Take 1 tablet (10 mEq total) by mouth 2 (two) times daily.     Vitamin D (Ergocalciferol) 50000 UNITS Caps capsule  Commonly known as:  DRISDOL  TAKE ONE CAPSULE BY MOUTH ONCE A WEEK           Follow-up Information    Follow up with [name], MD.   Specialties:  Orthopedic Surgery, Radiology   Contact information:   [address] [place] [address] [phone]       Signed: [name] 02/26/2015, 10:03 AM

## 2015-02-26 NOTE — Progress Notes (Signed)
Pt discharged to home with her niece who will be her PCG via private vehicle.  Pt stable upon discharge. VS:  WNL. Discharge instructions reviewed with pt/niece.  Prescriptions provided to pt/niece.  Pt/niece verbalized understanding.  [facility] agency contacted for home PT visits.  Home Health order and facesheet faxed to [phone] per Kendrick Fries.  Knee immobilizer sent home with pt.  Air cast ice system sent home with pt.  Pt verbalized exceptional service was provided to her by all the staff during her visit.

## 2015-02-26 NOTE — Progress Notes (Signed)
Subjective: 3 Days Post-Op Procedure(s) (LRB): RIGHT TOTAL KNEE ARTHROPLASTY (Right) Patient reports pain as mild.    Objective: Vital signs in last 24 hours: Temp:  [97.8 F (36.6 C)-98.8 F (37.1 C)] 98.2 F (36.8 C) (11/05 0000) Pulse Rate:  [80-86] 81 (11/05 0000) Resp:  [20] 20 (11/05 0000) BP: (116-132)/(61-69) 125/64 mmHg (11/05 0000) SpO2:  [97 %-99 %] 97 % (11/05 0000)  Intake/Output from previous day: 11/04 0701 - 11/05 0700 In: -  Out: 700 [Urine:700] Intake/Output this shift:     Recent Labs  02/24/15 0618 02/25/15 0554 02/26/15 0548  HGB 11.4* 10.0* 10.1*    Recent Labs  02/25/15 0554 02/26/15 0548  WBC 6.4 5.4  RBC 3.19* 3.12*  HCT 29.1* 28.8*  PLT 139* 126*    Recent Labs  02/24/15 0618  NA 136  K 3.7  CL 103  CO2 26  BUN 8  CREATININE 0.80  GLUCOSE 118*  CALCIUM 8.6*   No results for input(s): LABPT, INR in the last 72 hours.  Neurologically intact Neurovascular intact Sensation intact distally Intact pulses distally Dorsiflexion/Plantar flexion intact Incision: scant drainage Compartment soft  Assessment/Plan: 3 Days Post-Op Procedure(s) (LRB): RIGHT TOTAL KNEE ARTHROPLASTY (Right) Discharge home with home health  [name] 02/26/2015, 9:58 AM

## 2015-02-28 LAB — TYPE AND SCREEN
ABO/RH(D): O POS
ANTIBODY SCREEN: NEGATIVE
UNIT DIVISION: 0
UNIT DIVISION: 0
Unit division: 0
Unit division: 0

## 2015-03-07 ENCOUNTER — Ambulatory Visit (INDEPENDENT_AMBULATORY_CARE_PROVIDER_SITE_OTHER): Payer: Federal, State, Local not specified - PPO | Admitting: Orthopedic Surgery

## 2015-03-07 VITALS — BP 122/89 | Ht 63.0 in | Wt 191.0 lb

## 2015-03-07 DIAGNOSIS — Z96651 Presence of right artificial knee joint: Secondary | ICD-10-CM

## 2015-03-07 MED ORDER — HYDROCODONE-ACETAMINOPHEN 10-325 MG PO TABS: 1.0000 | ORAL_TABLET | ORAL | Status: DC | PRN

## 2015-03-07 NOTE — Progress Notes (Signed)
Chief Complaint  Patient presents with  . Follow-up    post op 1, right TKA, DOS 02/23/15    Encounter Diagnosis  Name Primary?  . Status post total right knee replacement Yes    Patient is doing well no calf edema no calf swelling no calf tenderness. Incision is clean dry and intact staples taken out Steri-Strips applied The patient to remove her TED hose at postop week #2  Continue aspirin postop week #4  Therapy notes as patient is only getting 70 of flexion but by next time we should be at least up to 90 there is no restrictions or impediments to flexion at surgery other than her long-standing disease  Refill prescription follow-up in 2 weeks to get a new prescription and checked the flexion

## 2015-03-07 NOTE — Patient Instructions (Signed)
Call [place] outpatient therapy dept @ [facility] to schedule therapy visits

## 2015-03-15 ENCOUNTER — Encounter (HOSPITAL_COMMUNITY): Payer: Federal, State, Local not specified - PPO | Attending: Oncology

## 2015-03-15 VITALS — BP 117/79 | HR 87 | Temp 98.0°F | Resp 18

## 2015-03-15 DIAGNOSIS — E538 Deficiency of other specified B group vitamins: Secondary | ICD-10-CM

## 2015-03-15 MED ORDER — CYANOCOBALAMIN 1000 MCG/ML IJ SOLN
1000.0000 ug | Freq: Once | INTRAMUSCULAR | Status: AC
  Administered 2015-03-15: 1000 ug via INTRAMUSCULAR

## 2015-03-15 MED ORDER — CYANOCOBALAMIN 1000 MCG/ML IJ SOLN
INTRAMUSCULAR | Status: AC
  Filled 2015-03-15: qty 1

## 2015-03-15 NOTE — Progress Notes (Signed)
Sheri Jones presents today for injection per MD orders. B12 1000 mcg administered IM in right Gluteal. Administration without incident. Patient tolerated well.  

## 2015-03-15 NOTE — Patient Instructions (Signed)
[facility] Discharge Instructions  RECOMMENDATIONS MADE BY THE CONSULTANT AND ANY TEST RESULTS WILL BE SENT TO YOUR REFERRING PHYSICIAN.  B12 today.    Thank you for choosing [facility] at [facility] to provide your oncology and hematology care.  To afford each patient quality time with our provider, please arrive at least 15 minutes before your scheduled appointment time.    You need to re-schedule your appointment should you arrive 10 or more minutes late.  We strive to give you quality time with our providers, and arriving late affects you and other patients whose appointments are after yours.  Also, if you no show three or more times for appointments you may be dismissed from the clinic at the providers discretion.     Again, thank you for choosing [facility].  Our hope is that these requests will decrease the amount of time that you wait before being seen by our physicians.       _____________________________________________________________  Should you have questions after your visit to  Cancer Center, please contact our office at [phone] between the hours of 8:30 a.m. and 4:30 p.m.  Voicemails left after 4:30 p.m. will not be returned until the following business day.  For prescription refill requests, have your pharmacy contact our office.      

## 2015-03-21 ENCOUNTER — Encounter: Payer: Self-pay | Admitting: Physical Therapy

## 2015-03-21 ENCOUNTER — Ambulatory Visit: Payer: Federal, State, Local not specified - PPO | Attending: Orthopedic Surgery | Admitting: Physical Therapy

## 2015-03-21 DIAGNOSIS — R262 Difficulty in walking, not elsewhere classified: Secondary | ICD-10-CM | POA: Insufficient documentation

## 2015-03-21 DIAGNOSIS — M7989 Other specified soft tissue disorders: Secondary | ICD-10-CM | POA: Insufficient documentation

## 2015-03-21 DIAGNOSIS — M25661 Stiffness of right knee, not elsewhere classified: Secondary | ICD-10-CM | POA: Insufficient documentation

## 2015-03-21 DIAGNOSIS — M25561 Pain in right knee: Secondary | ICD-10-CM | POA: Diagnosis not present

## 2015-03-21 NOTE — Therapy (Signed)
[address] [place], [place], [address] Phone: [phone]   Fax:  [phone]  Physical Therapy Evaluation  Patient Details  Name: Sheri Jones MRN: [id number] Date of Birth: 1945-07-02 Referring Provider: [name]  Encounter Date: 03/21/2015      PT End of Session - 03/21/15 1508    Visit Number 1   Date for PT Re-Evaluation 05/21/15   PT Start Time 1445   PT Stop Time 1535   PT Time Calculation (min) 50 min   Activity Tolerance Patient tolerated treatment well   Behavior During Therapy Anxious      Past Medical History  Diagnosis Date  . Allergic rhinitis due to pollen   . Osteoarthrosis, unspecified whether generalized or localized, lower leg   . Obesity, unspecified   . Unspecified essential hypertension   . Anemia   . Diabetes mellitus without complication ([place])   . 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 Vitamin B12 deficiency disease 10/28/2008    Qualifier: Diagnosis of  By: Moshe Cipro MD, Joycelyn Schmid      Past Surgical History  Procedure Laterality Date  . Gastric bypass  2004  . Colonoscopy  2008  . Total knee arthroplasty Right 02/23/2015    Procedure: RIGHT TOTAL KNEE ARTHROPLASTY;  Surgeon: Carole Civil, MD;  Location: [facility];  Service: Orthopedics;  Laterality: Right;    There were no vitals filed for this visit.  Visit Diagnosis:  Right knee pain - Plan: PT plan of care cert/re-cert  Knee stiffness, right - Plan: PT plan of care cert/re-cert  Difficulty walking - Plan: PT plan of care cert/re-cert  Swelling of limb - Plan: PT plan of care cert/re-cert      Subjective Assessment - 03/21/15 1449    Subjective Patient underwent a right TKR on 02/23/15.  Reports that she hse been having difficulty with bending the knee.  She reports that she had home PT until 03/11/15.   Limitations Walking   Patient Stated Goals walk , have better ROM and less painm   Currently in Pain? Yes   Pain Score 4    Pain Location Knee   Pain Orientation Right   Pain Descriptors / Indicators Aching;Sore;Tightness   Pain Type Surgical pain   Pain Onset 1 to 4 weeks ago   Pain Frequency Constant   Aggravating Factors  trying to bend the knee pain was up to 9/10   Pain Relieving Factors pain meds, rest and some ice will decrease pain to 2/10   Effect of Pain on Daily Activities difficulty with ADL's            [facility] PT Assessment - 03/21/15 0001    Assessment   Medical Diagnosis s/p right TKR    Referring Provider [name]   Onset Date/Surgical Date 02/23/15   Prior Therapy home   Precautions   Precautions None   Balance Screen   Has the patient fallen in the past 6 months No   Has the patient had a decrease in activity level because of a fear of falling?  No   Is the patient reluctant to leave their home because of a fear of falling?  No   Home Environment   Additional Comments single level home, does the housework, does some gardening   Prior Function   Level of Independence Independent   Vocation Retired   Leisure walked daily 3045 minutes   AROM   Overall  AROM Comments AROM at edge of bed was 25-70 degrees flexion, PROM 20-75 degrees flexion, she is very fearful to ROM   Strength   Overall Strength Comments 4/5   Palpation   Palpation comment scar is healing, some decreased mobility distally, mild tenderness, swelling, she has a spot in the mid scar laterlly that is puckered and very tight   Ambulation/Gait   Gait Comments walks with a walker, slow, very stiff right knee                   [facility] Adult PT Treatment/Exercise - 2015/03/25 0001    Exercises   Exercises Knee/Hip   Knee/Hip Exercises: Aerobic   Nustep Level 4 x 5 minutes   Knee/Hip Exercises: Machines for Strengthening   Cybex Knee Extension 5# 2x15   Cybex Knee Flexion 20# 2x15                PT Education - 03/25/2015 1507    Education provided Yes    Education Details Low load long duration for flexion and extension   Person(s) Educated Patient   Methods Explanation;Demonstration;Handout   Comprehension Verbalized understanding          PT Short Term Goals - 25-Mar-2015 1521    PT SHORT TERM GOAL #1   Title independent with initial HEP   Time 1   Period Weeks   Status New           PT Long Term Goals - 2015/03/25 1522    PT LONG TERM GOAL #1   Title understand and perform RICE   Time 8   Period Weeks   Status New   PT LONG TERM GOAL #2   Title be independent with scar mobilization   Time 8   Period Weeks   Status New   PT LONG TERM GOAL #3   Title walk 500 feet without assistive device and miniaml deviation   Time 8   Period Weeks   Status New   PT LONG TERM GOAL #4   Title increase AROM to 10-110 degrees flexion   Time 8   Period Weeks   Status New   PT LONG TERM GOAL #5   Title decrease pain 50%   Time 8   Period Weeks   Status New               Plan - 03/25/2015 1509    Clinical Impression Statement Patient underwent a right TKR on 02/23/15, she is very tight with limited AROM and PROM, very scared to move, AROM was 25-70 degrees flexion.  ambulates with a walker and stiff right knee.  She is very scared of exercise and motions.   Pt will benefit from skilled therapeutic intervention in order to improve on the following deficits Abnormal gait;Decreased mobility;Decreased range of motion;Difficulty walking;Decreased scar mobility;Decreased strength;Increased edema;Impaired flexibility;Pain   Rehab Potential Good   PT Frequency 2x / week   PT Duration 8 weeks   PT Treatment/Interventions Electrical Stimulation;Cryotherapy;Gait training;Stair training;Functional mobility training;Therapeutic exercise;Manual techniques;Vasopneumatic Device;Patient/family education;Passive range of motion;Balance training   PT Next Visit Plan Slowly add exercises and gait.  Needs to gain ROM but she is very scared.  Can  use modalities as needed for pain   Consulted and Agree with Plan of Care Patient          G-Codes - 2015-03-25 1526    Functional Assessment Tool Used foto   Functional Limitation Mobility: Walking and moving around  Mobility: Walking and Moving Around Current Status [phone]) At least 60 percent but less than 80 percent impaired, limited or restricted   Mobility: Walking and Moving Around Goal Status [phone]) At least 40 percent but less than 60 percent impaired, limited or restricted       Problem List Patient Active Problem List   Diagnosis Date Noted  . Primary osteoarthritis of right knee   . Knee pain, right 11/29/2014  . Excess skin of arm 11/29/2014  . Osteopenia 08/12/2013  . 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 Prediabetes 08/07/2011  . Vitamin D deficiency 08/07/2011  . Abnormal facial hair 03/29/2011  . PERSONAL HX COLONIC POLYPS 07/05/2010  . KNEE, ARTHRITIS, DEGEN./OSTEO 01/16/2010  . Vitamin B12 deficiency disease 10/28/2008  . 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 Obesity 08/15/2007  . Essential hypertension 08/15/2007    Sumner Boast., PT 03/21/2015, 3:29 PM  [address] [place], [place], [address] Phone: [phone]   Fax:  [phone]  Name: VIVIAN SWARTZENTRUBER MRN: [id number] Date of Birth: Dec 13, 1945

## 2015-03-22 ENCOUNTER — Ambulatory Visit (INDEPENDENT_AMBULATORY_CARE_PROVIDER_SITE_OTHER): Payer: Federal, State, Local not specified - PPO | Admitting: Orthopedic Surgery

## 2015-03-22 VITALS — BP 107/71 | Ht 63.0 in | Wt 191.0 lb

## 2015-03-22 DIAGNOSIS — Z96651 Presence of right artificial knee joint: Secondary | ICD-10-CM

## 2015-03-22 DIAGNOSIS — Z4789 Encounter for other orthopedic aftercare: Secondary | ICD-10-CM

## 2015-03-22 MED ORDER — HYDROCODONE-ACETAMINOPHEN 10-325 MG PO TABS: 1.0000 | ORAL_TABLET | ORAL | Status: DC | PRN

## 2015-03-22 NOTE — Progress Notes (Signed)
Chief Complaint  Patient presents with  . Follow-up    post op Rt TKA, DOS 02/23/15    Postop week #4 status post right total knee  The patient is doing well but only has 70 of knee flexion. She was able to get 84 with gentle pressure in the office. I told her we don't have 90 in 2 weeks we'll have to do manipulation otherwise her incision looks good she has minimal to no swelling no significant pain  Follow-up 2 weeks

## 2015-03-25 ENCOUNTER — Ambulatory Visit: Payer: Federal, State, Local not specified - PPO | Attending: Orthopedic Surgery | Admitting: Physical Therapy

## 2015-03-25 ENCOUNTER — Encounter: Payer: Self-pay | Admitting: Physical Therapy

## 2015-03-25 DIAGNOSIS — M25661 Stiffness of right knee, not elsewhere classified: Secondary | ICD-10-CM | POA: Insufficient documentation

## 2015-03-25 DIAGNOSIS — M7989 Other specified soft tissue disorders: Secondary | ICD-10-CM | POA: Insufficient documentation

## 2015-03-25 DIAGNOSIS — M25561 Pain in right knee: Secondary | ICD-10-CM | POA: Diagnosis present

## 2015-03-25 DIAGNOSIS — R262 Difficulty in walking, not elsewhere classified: Secondary | ICD-10-CM | POA: Insufficient documentation

## 2015-03-25 NOTE — Therapy (Signed)
[address] [place], [place], [address] Phone: [phone]   Fax:  [phone]  Physical Therapy Treatment  Patient Details  Name: Sheri Jones MRN: [id number] Date of Birth: 1946/01/17 Referring Provider: [name]  Encounter Date: 03/25/2015      PT End of Session - 03/25/15 0840    Visit Number 2   Date for PT Re-Evaluation 05/21/15   PT Start Time 0759   PT Stop Time 0856   PT Time Calculation (min) 57 min      Past Medical History  Diagnosis Date  . Allergic rhinitis due to pollen   . Osteoarthrosis, unspecified whether generalized or localized, lower leg   . Obesity, unspecified   . Unspecified essential hypertension   . Anemia   . Diabetes mellitus without complication ([place])   . 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 Vitamin B12 deficiency disease 10/28/2008    Qualifier: Diagnosis of  By: Moshe Cipro MD, Joycelyn Schmid      Past Surgical History  Procedure Laterality Date  . Gastric bypass  2004  . Colonoscopy  2008  . Total knee arthroplasty Right 02/23/2015    Procedure: RIGHT TOTAL KNEE ARTHROPLASTY;  Surgeon: Carole Civil, MD;  Location: [facility];  Service: Orthopedics;  Laterality: Right;    There were no vitals filed for this visit.  Visit Diagnosis:  Knee stiffness, right  Difficulty walking  Swelling of limb  Right knee pain      Subjective Assessment - 03/25/15 0803    Subjective Pt reports that things are going on. Seen MD on 11/29 and he was not pleased with Pt progress regarding ROM. Pt reports that MD did release her to drive.   Currently in Pain? No/denies   Pain Score 0-No pain                         [facility] Adult PT Treatment/Exercise - 03/25/15 0001    Knee/Hip Exercises: Aerobic   Nustep Level 4 x 5 minutes   Knee/Hip Exercises: Machines for Strengthening   Cybex Knee Extension 5# 2x15   Cybex Knee Flexion 20# 2x15   Cybex  Leg Press #20 2x15, RLE only no weight x15    Knee/Hip Exercises: Supine   Other Supine Knee/Hip Exercises Seated TKE with blue Tband 2x15    Modalities   Modalities Vasopneumatic   Vasopneumatic   Number Minutes Vasopneumatic  15 minutes   Vasopnuematic Location  Knee   Vasopneumatic Pressure Medium   Vasopneumatic Temperature  34   Manual Therapy   Manual Therapy Soft tissue mobilization;Passive ROM   Soft tissue mobilization R hanstring    Passive ROM R knee Taken to end ranges and held                  PT Short Term Goals - 03/21/15 1521    PT SHORT TERM GOAL #1   Title independent with initial HEP   Time 1   Period Weeks   Status New           PT Long Term Goals - 03/21/15 1522    PT LONG TERM GOAL #1   Title understand and perform RICE   Time 8   Period Weeks   Status New   PT LONG TERM GOAL #2   Title be independent with scar mobilization   Time 8   Period Weeks   Status New  PT LONG TERM GOAL #3   Title walk 500 feet without assistive device and miniaml deviation   Time 8   Period Weeks   Status New   PT LONG TERM GOAL #4   Title increase AROM to 10-110 degrees flexion   Time 8   Period Weeks   Status New   PT LONG TERM GOAL #5   Title decrease pain 50%   Time 8   Period Weeks   Status New               Plan - 03/25/15 [id number]    Clinical Impression Statement Pt nervous this date but calm down as treatment went on. Pt reports being frighten to walk. Conservative treatment due pt nervousness. Pt tolerated all interventions this date, but guarded during manual therapy. Pt able to reach 90 deg  PROM.    Pt will benefit from skilled therapeutic intervention in order to improve on the following deficits Abnormal gait;Decreased mobility;Decreased range of motion;Difficulty walking;Decreased scar mobility;Decreased strength;Increased edema;Impaired flexibility;Pain   Rehab Potential Good   PT Frequency 2x / week   PT Duration 8 weeks    PT Next Visit Plan Slowly add exercises and gait.  Needs to gain ROM but she is very scared.  Can use modalities as needed for pain        Problem List Patient Active Problem List   Diagnosis Date Noted  . Primary osteoarthritis of right knee   . Knee pain, right 11/29/2014  . Excess skin of arm 11/29/2014  . Osteopenia 08/12/2013  . 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 Prediabetes 08/07/2011  . Vitamin D deficiency 08/07/2011  . Abnormal facial hair 03/29/2011  . PERSONAL HX COLONIC POLYPS 07/05/2010  . KNEE, ARTHRITIS, DEGEN./OSTEO 01/16/2010  . Vitamin B12 deficiency disease 10/28/2008  . 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 Obesity 08/15/2007  . Essential hypertension 08/15/2007    Scot Jun, PTA  03/25/2015, 8:45 AM  [address] [place], [place], [address] Phone: [phone]   Fax:  [phone]  Name: Sheri Jones MRN: [id number] Date of Birth: 02-28-1946

## 2015-03-28 ENCOUNTER — Encounter: Payer: Self-pay | Admitting: Physical Therapy

## 2015-03-28 ENCOUNTER — Ambulatory Visit: Payer: Federal, State, Local not specified - PPO | Admitting: Physical Therapy

## 2015-03-28 DIAGNOSIS — M7989 Other specified soft tissue disorders: Secondary | ICD-10-CM

## 2015-03-28 DIAGNOSIS — M25661 Stiffness of right knee, not elsewhere classified: Secondary | ICD-10-CM | POA: Diagnosis not present

## 2015-03-28 DIAGNOSIS — M25561 Pain in right knee: Secondary | ICD-10-CM

## 2015-03-28 DIAGNOSIS — R262 Difficulty in walking, not elsewhere classified: Secondary | ICD-10-CM

## 2015-03-28 NOTE — Therapy (Signed)
[address] [place], [place], [address] Phone: [phone]   Fax:  [phone]  Physical Therapy Treatment  Patient Details  Name: Sheri Jones MRN: [id number] Date of Birth: [birth year] Referring Provider: [name]  Encounter Date: 03/28/2015      PT End of Session - 03/28/15 0844    Visit Number 3   Date for PT Re-Evaluation 05/21/15   PT Start Time 0800   PT Stop Time 0854   PT Time Calculation (min) 54 min   Activity Tolerance Patient tolerated treatment well      Past Medical History  Diagnosis Date  . Allergic rhinitis due to pollen   . Osteoarthrosis, unspecified whether generalized or localized, lower leg   . Obesity, unspecified   . Unspecified essential hypertension   . Anemia   . Diabetes mellitus without complication ([place])   . 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 Vitamin B12 deficiency disease 10/28/2008    Qualifier: Diagnosis of  By: Moshe Cipro MD, Joycelyn Schmid      Past Surgical History  Procedure Laterality Date  . Gastric bypass  2004  . Colonoscopy  2008  . Total knee arthroplasty Right 02/23/2015    Procedure: RIGHT TOTAL KNEE ARTHROPLASTY;  Surgeon: Carole Civil, MD;  Location: [facility];  Service: Orthopedics;  Laterality: Right;    There were no vitals filed for this visit.  Visit Diagnosis:  Difficulty walking  Swelling of limb  Right knee pain  Knee stiffness, right      Subjective Assessment - 03/28/15 0802    Subjective Pt reports a good weekend without any issues   Currently in Pain? No/denies   Pain Score 0-No pain            [facility] PT Assessment - 03/28/15 0001    AROM   Overall AROM Comments AROM at edge of bed 18-85                     [facility] Adult PT Treatment/Exercise - 03/28/15 0001    Ambulation/Gait   Gait Comments walks with SPC, slow, very stiff right knee   Knee/Hip Exercises: Aerobic   Nustep Level 4 x 6  minutes   Knee/Hip Exercises: Machines for Strengthening   Cybex Knee Extension 5# 2x15   Cybex Leg Press #20 2x15, RLE only no weight x15    Knee/Hip Exercises: Seated   Abduction/Adduction  2 sets;15 reps   Abd/Adduction Limitations blue tband, ball squeeze   Sit to Sand 2 sets;5 reps;with UE support   Modalities   Modalities Vasopneumatic   Vasopneumatic   Number Minutes Vasopneumatic  15 minutes   Vasopnuematic Location  Knee   Vasopneumatic Pressure Medium   Vasopneumatic Temperature  34   Manual Therapy   Manual Therapy Soft tissue mobilization;Passive ROM   Soft tissue mobilization R hamstring, R quad    Passive ROM R knee Taken to end ranges and held                  PT Short Term Goals - 03/28/15 0844    PT SHORT TERM GOAL #1   Title independent with initial HEP   Status Achieved           PT Long Term Goals - 03/28/15 0844    PT LONG TERM GOAL #1   Title understand and perform RICE   Status Partially Met  Plan - 03/28/15 0844    Clinical Impression Statement Pt has met STG, Tolerated some functional interventions this date well. Minor increase in R knee AROM. Pt guarded with c/o minor pain with MT.    Pt will benefit from skilled therapeutic intervention in order to improve on the following deficits Abnormal gait;Decreased mobility;Decreased range of motion;Difficulty walking;Decreased scar mobility;Decreased strength;Increased edema;Impaired flexibility;Pain   Rehab Potential Good   PT Frequency 2x / week   PT Duration 8 weeks   PT Treatment/Interventions Electrical Stimulation;Cryotherapy;Gait training;Stair training;Functional mobility training;Therapeutic exercise;Manual techniques;Vasopneumatic Device;Patient/family education;Passive range of motion;Balance training   PT Next Visit Plan Slowly add exercises and gait.  Needs to gain ROM but she is very scared.  Can use modalities as needed for pain        Problem  List Patient Active Problem List   Diagnosis Date Noted  . Primary osteoarthritis of right knee   . Knee pain, right 11/29/2014  . Excess skin of arm 11/29/2014  . Osteopenia 08/12/2013  . 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 Prediabetes 08/07/2011  . Vitamin D deficiency 08/07/2011  . Abnormal facial hair 03/29/2011  . PERSONAL HX COLONIC POLYPS 07/05/2010  . KNEE, ARTHRITIS, DEGEN./OSTEO 01/16/2010  . Vitamin B12 deficiency disease 10/28/2008  . 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 Obesity 08/15/2007  . Essential hypertension 08/15/2007    Scot Jun, PTA  03/28/2015, 8:48 AM  [place] [address] [place], [place], [address] Phone: [phone]   Fax:  [phone]  Name: Sheri Jones MRN: [id number] Date of Birth: 06/04/1945

## 2015-03-29 ENCOUNTER — Ambulatory Visit: Payer: Federal, State, Local not specified - PPO | Admitting: Physical Therapy

## 2015-03-31 ENCOUNTER — Encounter: Payer: Self-pay | Admitting: Physical Therapy

## 2015-03-31 ENCOUNTER — Ambulatory Visit: Payer: Federal, State, Local not specified - PPO | Admitting: Physical Therapy

## 2015-03-31 DIAGNOSIS — M25561 Pain in right knee: Secondary | ICD-10-CM

## 2015-03-31 DIAGNOSIS — M7989 Other specified soft tissue disorders: Secondary | ICD-10-CM

## 2015-03-31 DIAGNOSIS — M25661 Stiffness of right knee, not elsewhere classified: Secondary | ICD-10-CM

## 2015-03-31 DIAGNOSIS — R262 Difficulty in walking, not elsewhere classified: Secondary | ICD-10-CM

## 2015-03-31 NOTE — Therapy (Signed)
[address] [place], [place], [address] Phone: [phone]   Fax:  [phone]  Physical Therapy Treatment  Patient Details  Name: Sheri Jones MRN: [id number] Date of Birth: Feb 15, 1946 Referring Provider: [name]  Encounter Date: 03/31/2015      PT End of Session - 03/31/15 0841    Visit Number 4   Date for PT Re-Evaluation 05/21/15   PT Start Time 0758   PT Stop Time 0855   PT Time Calculation (min) 57 min   Activity Tolerance Patient tolerated treatment well      Past Medical History  Diagnosis Date  . Allergic rhinitis due to pollen   . Osteoarthrosis, unspecified whether generalized or localized, lower leg   . Obesity, unspecified   . Unspecified essential hypertension   . Anemia   . Diabetes mellitus without complication ([place])   . 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 Vitamin B12 deficiency disease 10/28/2008    Qualifier: Diagnosis of  By: Moshe Cipro MD, Joycelyn Schmid      Past Surgical History  Procedure Laterality Date  . Gastric bypass  2004  . Colonoscopy  2008  . Total knee arthroplasty Right 02/23/2015    Procedure: RIGHT TOTAL KNEE ARTHROPLASTY;  Surgeon: Carole Civil, MD;  Location: [facility];  Service: Orthopedics;  Laterality: Right;    There were no vitals filed for this visit.  Visit Diagnosis:  Right knee pain  Knee stiffness, right  Swelling of limb  Difficulty walking      Subjective Assessment - 03/31/15 0759    Subjective "Things are going good, I go the doctor Wednesday"   Currently in Pain? No/denies   Pain Score 0-No pain            [facility] PT Assessment - 03/31/15 0001    AROM   Overall AROM Comments AROM at edge of bed 19-83                     [facility] Adult PT Treatment/Exercise - 03/31/15 0001    Knee/Hip Exercises: Aerobic   Stationary Bike x3 min R hip hike to make full revolutions   Nustep Level 4 x 6 minutes    Knee/Hip Exercises: Machines for Strengthening   Cybex Leg Press #20 2x15, RLE only no weight x10    Knee/Hip Exercises: Standing   Forward Step Up 2 sets;Right;Step Height: 4";10 reps   Other Standing Knee Exercises Standing march R X10    Other Standing Knee Exercises squats at sink X10   Knee/Hip Exercises: Seated   Sit to Sand 2 sets;5 reps;with UE support   Modalities   Modalities Vasopneumatic   Vasopneumatic   Number Minutes Vasopneumatic  15 minutes   Vasopnuematic Location  Knee   Vasopneumatic Pressure Medium   Vasopneumatic Temperature  34   Manual Therapy   Manual Therapy Soft tissue mobilization;Passive ROM   Manual therapy comments fearful & guarded   Soft tissue mobilization R hamstring, R quad    Passive ROM R knee Taken to end ranges and held                  PT Short Term Goals - 03/28/15 0844    PT SHORT TERM GOAL #1   Title independent with initial HEP   Status Achieved           PT Long Term Goals - 03/28/15 0844    PT LONG  TERM GOAL #1   Title understand and perform RICE   Status Partially Met               Plan - 03/31/15 0842    Clinical Impression Statement Pt with a minor decrease in R knee AROM, Pt remains guarded and self limiting with MT. Pt does work hard during her exercises but does not like pain when it comes to stretching R knee into flex and Ext.    Pt will benefit from skilled therapeutic intervention in order to improve on the following deficits Abnormal gait;Decreased mobility;Decreased range of motion;Difficulty walking;Decreased scar mobility;Decreased strength;Increased edema;Impaired flexibility;Pain   Rehab Potential Good   PT Frequency 2x / week   PT Duration 8 weeks   PT Treatment/Interventions Electrical Stimulation;Cryotherapy;Gait training;Stair training;Functional mobility training;Therapeutic exercise;Manual techniques;Vasopneumatic Device;Patient/family education;Passive range of motion;Balance training    PT Next Visit Plan Slowly add exercises and gait.  Try to progress with ROM        Problem List Patient Active Problem List   Diagnosis Date Noted  . Primary osteoarthritis of right knee   . Knee pain, right 11/29/2014  . Excess skin of arm 11/29/2014  . Osteopenia 08/12/2013  . 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 Prediabetes 08/07/2011  . Vitamin D deficiency 08/07/2011  . Abnormal facial hair 03/29/2011  . PERSONAL HX COLONIC POLYPS 07/05/2010  . KNEE, ARTHRITIS, DEGEN./OSTEO 01/16/2010  . Vitamin B12 deficiency disease 10/28/2008  . 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 Obesity 08/15/2007  . Essential hypertension 08/15/2007    Scot Jun, PTA  03/31/2015, 8:45 AM  [place] [place], [place], [address] Phone: [phone]   Fax:  [phone]  Name: Sheri Jones MRN: [id number] Date of Birth: 08/08/1945

## 2015-04-05 ENCOUNTER — Ambulatory Visit: Payer: Federal, State, Local not specified - PPO | Admitting: Physical Therapy

## 2015-04-05 ENCOUNTER — Encounter: Payer: Self-pay | Admitting: Physical Therapy

## 2015-04-05 DIAGNOSIS — R262 Difficulty in walking, not elsewhere classified: Secondary | ICD-10-CM

## 2015-04-05 DIAGNOSIS — M25661 Stiffness of right knee, not elsewhere classified: Secondary | ICD-10-CM | POA: Diagnosis not present

## 2015-04-05 DIAGNOSIS — M25561 Pain in right knee: Secondary | ICD-10-CM

## 2015-04-05 DIAGNOSIS — M7989 Other specified soft tissue disorders: Secondary | ICD-10-CM

## 2015-04-05 NOTE — Therapy (Signed)
[address] [place], [place], [address] Phone: [phone]   Fax:  [phone]  Physical Therapy Treatment  Patient Details  Name: Sheri Jones MRN: [id number] Date of Birth: 21-Jan-1946 Referring Provider: [name]  Encounter Date: 04/05/2015      PT End of Session - 04/05/15 0833    Visit Number 5   Date for PT Re-Evaluation 05/21/15   PT Start Time 0800   PT Stop Time 0839   PT Time Calculation (min) 39 min      Past Medical History  Diagnosis Date  . Allergic rhinitis due to pollen   . Osteoarthrosis, unspecified whether generalized or localized, lower leg   . Obesity, unspecified   . Unspecified essential hypertension   . Anemia   . Diabetes mellitus without complication ([place])   . 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 Vitamin B12 deficiency disease 10/28/2008    Qualifier: Diagnosis of  By: Moshe Cipro MD, Joycelyn Schmid      Past Surgical History  Procedure Laterality Date  . Gastric bypass  2004  . Colonoscopy  2008  . Total knee arthroplasty Right 02/23/2015    Procedure: RIGHT TOTAL KNEE ARTHROPLASTY;  Surgeon: Carole Civil, MD;  Location: [facility];  Service: Orthopedics;  Laterality: Right;    There were no vitals filed for this visit.  Visit Diagnosis:  Knee stiffness, right  Swelling of limb  Difficulty walking  Right knee pain      Subjective Assessment - 04/05/15 0804    Subjective Pt report that she was sore from last treatment. Pt states that she feels sick this morning and both of her legs are hurting.    Currently in Pain? No/denies   Pain Score 0-No pain  pain eased up as she warmed up            [facility] PT Assessment - 04/05/15 0001    AROM   Overall AROM Comments AROM at edge of bed 18-85                     [facility] Adult PT Treatment/Exercise - 04/05/15 0001    Ambulation/Gait   Gait Comments walks with a limp   Knee/Hip  Exercises: Aerobic   Stationary Bike x3 min R hip hike to make full revolutions   Nustep Level 4 x 6 minutes   Knee/Hip Exercises: Machines for Strengthening   Cybex Knee Extension 5# 2x15   Cybex Knee Flexion 20# 2x15   Cybex Leg Press #20 2x15, RLE only no weight x10    Knee/Hip Exercises: Standing   Forward Step Up Right;5 reps;Step Height: 6";3 sets   Other Standing Knee Exercises Standing march R 2X10    Other Standing Knee Exercises squats at sink 2x10   Knee/Hip Exercises: Seated   Sit to Sand 2 sets;5 reps;with UE support   Knee/Hip Exercises: Supine   Other Supine Knee/Hip Exercises Seated TKE with blue Tband 2x10    Manual Therapy   Manual Therapy Soft tissue mobilization;Passive ROM   Soft tissue mobilization R hanstring, R quad    Passive ROM R knee Taken to end ranges and held                  PT Short Term Goals - 03/28/15 0844    PT SHORT TERM GOAL #1   Title independent with initial HEP   Status Achieved  PT Long Term Goals - 04/05/15 0833    PT LONG TERM GOAL #2   Title be independent with scar mobilization   Status Achieved   PT LONG TERM GOAL #5   Title decrease pain 50%   Status Achieved               Plan - 04/05/15 0834    Clinical Impression Statement Pt remains guarded with MT and has restricted R knee ROM. Pt continues to give good effort with exercises but does not like pain. Pt reports that she will ice her R Knee at home.   Pt will benefit from skilled therapeutic intervention in order to improve on the following deficits Abnormal gait;Decreased mobility;Decreased range of motion;Difficulty walking;Decreased scar mobility;Decreased strength;Increased edema;Impaired flexibility;Pain   Rehab Potential Good   PT Frequency 2x / week   PT Duration 8 weeks   PT Treatment/Interventions Electrical Stimulation;Cryotherapy;Gait training;Stair training;Functional mobility training;Therapeutic exercise;Manual  techniques;Vasopneumatic Device;Patient/family education;Passive range of motion;Balance training   PT Next Visit Plan Slowly add exercises and gait.  Try to progress with ROM        Problem List Patient Active Problem List   Diagnosis Date Noted  . Primary osteoarthritis of right knee   . Knee pain, right 11/29/2014  . Excess skin of arm 11/29/2014  . Osteopenia 08/12/2013  . 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 Prediabetes 08/07/2011  . Vitamin D deficiency 08/07/2011  . Abnormal facial hair 03/29/2011  . PERSONAL HX COLONIC POLYPS 07/05/2010  . KNEE, ARTHRITIS, DEGEN./OSTEO 01/16/2010  . Vitamin B12 deficiency disease 10/28/2008  . 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 Obesity 08/15/2007  . Essential hypertension 08/15/2007    Scot Jun, PTA  04/05/2015, 8:41 AM  [place], [place], [address] Phone: [phone]   Fax:  [phone]  Name: Sheri Jones MRN: [id number] Date of Birth: 1945/05/09

## 2015-04-06 ENCOUNTER — Ambulatory Visit (INDEPENDENT_AMBULATORY_CARE_PROVIDER_SITE_OTHER): Payer: Federal, State, Local not specified - PPO | Admitting: Orthopedic Surgery

## 2015-04-06 VITALS — BP 125/78 | Ht 63.0 in | Wt 191.0 lb

## 2015-04-06 DIAGNOSIS — Z96651 Presence of right artificial knee joint: Secondary | ICD-10-CM

## 2015-04-06 DIAGNOSIS — Z4789 Encounter for other orthopedic aftercare: Secondary | ICD-10-CM

## 2015-04-06 NOTE — Progress Notes (Signed)
Post op   Chief Complaint  Patient presents with  . Follow-up    2 week recheck on right knee post op TKA, DOS 02-23-15.    The patient is status post total knee she's had a tough time getting her motion back. Today I measure 82 she measured 85 in therapy at the improvement of 10 recommend two-week follow-up. She's also having trouble with extension she had a 5 flexion contracture needs to work on that as well. She is at high risk for need for manipulation  Reassess next visit  Encounter Diagnoses  Name Primary?  . Status post total right knee replacement   . Surgical aftercare, musculoskeletal system Yes

## 2015-04-07 ENCOUNTER — Ambulatory Visit: Payer: Federal, State, Local not specified - PPO | Admitting: Orthopedic Surgery

## 2015-04-07 ENCOUNTER — Encounter: Payer: Self-pay | Admitting: Physical Therapy

## 2015-04-07 ENCOUNTER — Ambulatory Visit: Payer: Federal, State, Local not specified - PPO | Admitting: Physical Therapy

## 2015-04-07 DIAGNOSIS — M7989 Other specified soft tissue disorders: Secondary | ICD-10-CM

## 2015-04-07 DIAGNOSIS — M25661 Stiffness of right knee, not elsewhere classified: Secondary | ICD-10-CM | POA: Diagnosis not present

## 2015-04-07 DIAGNOSIS — R262 Difficulty in walking, not elsewhere classified: Secondary | ICD-10-CM

## 2015-04-07 DIAGNOSIS — M25561 Pain in right knee: Secondary | ICD-10-CM

## 2015-04-07 NOTE — Therapy (Signed)
[address] [place], [place], [address] Phone: [phone]   Fax:  [phone]  Physical Therapy Treatment  Patient Details  Name: Sheri Jones MRN: [id number] Date of Birth: [birth year] Referring Provider: [name]  Encounter Date: 04/07/2015      PT End of Session - 04/07/15 1345    Visit Number 6   Date for PT Re-Evaluation 05/21/15   PT Start Time 1300   PT Stop Time 1345   PT Time Calculation (min) 45 min   Activity Tolerance Patient tolerated treatment well   Behavior During Therapy Anxious      Past Medical History  Diagnosis Date  . Allergic rhinitis due to pollen   . Osteoarthrosis, unspecified whether generalized or localized, lower leg   . Obesity, unspecified   . Unspecified essential hypertension   . Anemia   . Diabetes mellitus without complication ([place])   . 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 Vitamin B12 deficiency disease 10/28/2008    Qualifier: Diagnosis of  By: Moshe Cipro MD, Joycelyn Schmid      Past Surgical History  Procedure Laterality Date  . Gastric bypass  2004  . Colonoscopy  2008  . Total knee arthroplasty Right 02/23/2015    Procedure: RIGHT TOTAL KNEE ARTHROPLASTY;  Surgeon: Carole Civil, MD;  Location: [facility];  Service: Orthopedics;  Laterality: Right;    There were no vitals filed for this visit.  Visit Diagnosis:  Difficulty walking  Right knee pain  Swelling of limb  Knee stiffness, right      Subjective Assessment - 04/07/15 1259    Subjective Pt reports going to the MD yesterday at 11:30, MD pleased wit pt progress,    Currently in Pain? Yes   Pain Score 4    Pain Location Ankle   Pain Orientation Right                         [facility] Adult PT Treatment/Exercise - 04/07/15 0001    Ambulation/Gait   Gait Comments Worked on R hip and knee flexion with swing through.   High Level Balance   High Level Balance  Activities Side stepping   Knee/Hip Exercises: Aerobic   Stationary Bike x3 min R hip hike to make full revolutions   Nustep Level 4 x 6 minutes   Knee/Hip Exercises: Machines for Strengthening   Cybex Knee Extension 5# 2x15   Cybex Knee Flexion 20# 2x15   Cybex Leg Press #20 2x15   Knee/Hip Exercises: Standing   Forward Step Up 2 sets;Right;10 reps;Step Height: 6"   Other Standing Knee Exercises heel raises 2x15 black bar   Other Standing Knee Exercises standing march # 3 2x10; R hip abd/add #3 2x10; R hip  Ext #3  X10   Knee/Hip Exercises: Supine   Other Supine Knee/Hip Exercises Seated TKE with blue Tband 2x15    Manual Therapy   Manual Therapy Soft tissue mobilization;Passive ROM   Soft tissue mobilization R hanstring, R quad    Passive ROM R knee Taken to end ranges and held                  PT Short Term Goals - 03/28/15 0844    PT SHORT TERM GOAL #1   Title independent with initial HEP   Status Achieved           PT Long Term Goals - 04/05/15 [id number]  PT LONG TERM GOAL #2   Title be independent with scar mobilization   Status Achieved   PT LONG TERM GOAL #5   Title decrease pain 50%   Status Achieved               Plan - 04/07/15 1345    Clinical Impression Statement Pt anxius this Pt treatment, requires increased time tro expalin and demostrate interventions for pt to understand. Compleated all interventions, Work in gait, required VC's to perfrom hip and knee flex fro swing through with RLE.   Pt will benefit from skilled therapeutic intervention in order to improve on the following deficits Abnormal gait;Decreased mobility;Decreased range of motion;Difficulty walking;Decreased scar mobility;Decreased strength;Increased edema;Impaired flexibility;Pain   Rehab Potential Good   PT Frequency 2x / week   PT Duration 8 weeks   PT Treatment/Interventions Electrical Stimulation;Cryotherapy;Gait training;Stair training;Functional mobility  training;Therapeutic exercise;Manual techniques;Vasopneumatic Device;Patient/family education;Passive range of motion;Balance training   PT Next Visit Plan ROM and gait         Problem List Patient Active Problem List   Diagnosis Date Noted  . Primary osteoarthritis of right knee   . Knee pain, right 11/29/2014  . Excess skin of arm 11/29/2014  . Osteopenia 08/12/2013  . 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 Prediabetes 08/07/2011  . Vitamin D deficiency 08/07/2011  . Abnormal facial hair 03/29/2011  . PERSONAL HX COLONIC POLYPS 07/05/2010  . KNEE, ARTHRITIS, DEGEN./OSTEO 01/16/2010  . Vitamin B12 deficiency disease 10/28/2008  . 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 Obesity 08/15/2007  . Essential hypertension 08/15/2007    Scot Jun, PTA  04/07/2015, 1:49 PM  [place], [place], [address] Phone: [phone]   Fax:  [phone]  Name: Sheri Jones MRN: [id number] Date of Birth: 01/10/1946

## 2015-04-08 ENCOUNTER — Other Ambulatory Visit: Payer: Self-pay | Admitting: Family Medicine

## 2015-04-12 ENCOUNTER — Ambulatory Visit: Payer: Federal, State, Local not specified - PPO | Admitting: Physical Therapy

## 2015-04-12 ENCOUNTER — Encounter: Payer: Self-pay | Admitting: Physical Therapy

## 2015-04-12 DIAGNOSIS — M7989 Other specified soft tissue disorders: Secondary | ICD-10-CM

## 2015-04-12 DIAGNOSIS — M25661 Stiffness of right knee, not elsewhere classified: Secondary | ICD-10-CM | POA: Diagnosis not present

## 2015-04-12 DIAGNOSIS — M25561 Pain in right knee: Secondary | ICD-10-CM

## 2015-04-12 DIAGNOSIS — R262 Difficulty in walking, not elsewhere classified: Secondary | ICD-10-CM

## 2015-04-12 NOTE — Therapy (Signed)
[place], [place], [address] Phone: [phone]   Fax:  [phone]  Physical Therapy Treatment  Patient Details  Name: Sheri Jones MRN: [id number] Date of Birth: 02/27/1946 Referring Provider: [name]  Encounter Date: 04/12/2015      PT End of Session - 04/12/15 0840    Visit Number 7   Date for PT Re-Evaluation 05/21/15   PT Start Time 0800   PT Stop Time 0842   PT Time Calculation (min) 42 min   Activity Tolerance Patient tolerated treatment well   Behavior During Therapy Anxious      Past Medical History  Diagnosis Date  . Allergic rhinitis due to pollen   . Osteoarthrosis, unspecified whether generalized or localized, lower leg   . Obesity, unspecified   . Unspecified essential hypertension   . Anemia   . Diabetes mellitus without complication ([place])   . 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 Vitamin B12 deficiency disease 10/28/2008    Qualifier: Diagnosis of  By: Moshe Cipro MD, Joycelyn Schmid      Past Surgical History  Procedure Laterality Date  . Gastric bypass  2004  . Colonoscopy  2008  . Total knee arthroplasty Right 02/23/2015    Procedure: RIGHT TOTAL KNEE ARTHROPLASTY;  Surgeon: Carole Civil, MD;  Location: [facility];  Service: Orthopedics;  Laterality: Right;    There were no vitals filed for this visit.  Visit Diagnosis:  Knee stiffness, right  Swelling of limb  Right knee pain  Difficulty walking      Subjective Assessment - 04/12/15 0801    Subjective Pt reports that things are going good, "I feel like I can bend my knee a little more."   Pain Score 3    Pain Location Knee   Pain Orientation Right            [facility] PT Assessment - 04/12/15 0001    AROM   Overall AROM Comments Supine R knee AAROM 18-90 flexion                     [facility] Adult PT Treatment/Exercise - 04/12/15 0001    Knee/Hip Exercises: Aerobic   Stationary Bike x4 min R hip hike to make full revolutions   Nustep Level 5 x 6 minutes   Knee/Hip Exercises: Machines for Strengthening   Cybex Knee Extension 5# 2x15   Cybex Knee Flexion RLE #10 2x15    Cybex Leg Press #20 2x15 X6 no weight working on stretch   Knee/Hip Exercises: Standing   Other Standing Knee Exercises standing march # 3 2x10; R hip abd/add #3 2x10; R hip  Ext #3  X10   Knee/Hip Exercises: Seated   Long Arc Quad 1 set;Right;10 reps   Manual Therapy   Manual Therapy Soft tissue mobilization;Passive ROM   Soft tissue mobilization R hamstring, R quad    Passive ROM R knee Taken to end ranges and held                  PT Short Term Goals - 03/28/15 0844    PT SHORT TERM GOAL #1   Title independent with initial HEP   Status Achieved           PT Long Term Goals - 04/05/15 [id number]    PT LONG TERM GOAL #2   Title be independent with scar mobilization   Status Achieved   PT LONG  TERM GOAL #5   Title decrease pain 50%   Status Achieved               Plan - 04/12/15 0841    Clinical Impression Statement Pt guarded throughout session when bending knee. On each machine intervention time taken to stretch R knee using a mechanical advantage. Slight increase in R knee AROM. Pt lacks TKE and amb with a limp.   Pt will benefit from skilled therapeutic intervention in order to improve on the following deficits Abnormal gait;Decreased mobility;Decreased range of motion;Difficulty walking;Decreased scar mobility;Decreased strength;Increased edema;Impaired flexibility;Pain   Rehab Potential Good   PT Frequency 2x / week   PT Duration 8 weeks   PT Treatment/Interventions Electrical Stimulation;Cryotherapy;Gait training;Stair training;Functional mobility training;Therapeutic exercise;Manual techniques;Vasopneumatic Device;Patient/family education;Passive range of motion;Balance training   PT Next Visit Plan ROM and gait         Problem List Patient  Active Problem List   Diagnosis Date Noted  . Primary osteoarthritis of right knee   . Knee pain, right 11/29/2014  . Excess skin of arm 11/29/2014  . Osteopenia 08/12/2013  . 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 Prediabetes 08/07/2011  . Vitamin D deficiency 08/07/2011  . Abnormal facial hair 03/29/2011  . PERSONAL HX COLONIC POLYPS 07/05/2010  . KNEE, ARTHRITIS, DEGEN./OSTEO 01/16/2010  . Vitamin B12 deficiency disease 10/28/2008  . 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 Obesity 08/15/2007  . Essential hypertension 08/15/2007    Sheri Jones , Sheri Jones   04/12/2015, 8:45 AM  [place], [place], [address] Phone: [phone]   Fax:  [phone]  Name: Sheri Jones MRN: [id number] Date of Birth: [birth year]

## 2015-04-14 ENCOUNTER — Encounter: Payer: Self-pay | Admitting: Physical Therapy

## 2015-04-14 ENCOUNTER — Ambulatory Visit: Payer: Federal, State, Local not specified - PPO | Admitting: Physical Therapy

## 2015-04-14 DIAGNOSIS — R262 Difficulty in walking, not elsewhere classified: Secondary | ICD-10-CM

## 2015-04-14 DIAGNOSIS — M25661 Stiffness of right knee, not elsewhere classified: Secondary | ICD-10-CM | POA: Diagnosis not present

## 2015-04-14 DIAGNOSIS — M7989 Other specified soft tissue disorders: Secondary | ICD-10-CM

## 2015-04-14 DIAGNOSIS — M25561 Pain in right knee: Secondary | ICD-10-CM

## 2015-04-14 NOTE — Therapy (Signed)
[place] [address] [place], [place], [address] Phone: [phone]   Fax:  [phone]  Physical Therapy Treatment  Patient Details  Name: Sheri Jones MRN: [id number] Date of Birth: [birth year] Referring Provider: [name]  Encounter Date: 04/14/2015      PT End of Session - 04/14/15 0844    Visit Number 8   Date for PT Re-Evaluation 05/21/15   PT Start Time 0800   PT Stop Time 0844   PT Time Calculation (min) 44 min   Activity Tolerance Patient tolerated treatment well   Behavior During Therapy Anxious;WFL for tasks assessed/performed      Past Medical History  Diagnosis Date  . Allergic rhinitis due to pollen   . Osteoarthrosis, unspecified whether generalized or localized, lower leg   . Obesity, unspecified   . Unspecified essential hypertension   . Anemia   . Diabetes mellitus without complication ([place])   . 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 Vitamin B12 deficiency disease 10/28/2008    Qualifier: Diagnosis of  By: Moshe Cipro MD, Joycelyn Schmid      Past Surgical History  Procedure Laterality Date  . Gastric bypass  2004  . Colonoscopy  2008  . Total knee arthroplasty Right 02/23/2015    Procedure: RIGHT TOTAL KNEE ARTHROPLASTY;  Surgeon: Carole Civil, MD;  Location: [facility];  Service: Orthopedics;  Laterality: Right;    There were no vitals filed for this visit.  Visit Diagnosis:  Swelling of limb  Difficulty walking  Right knee pain  Knee stiffness, right      Subjective Assessment - 04/14/15 0800    Subjective Pt reports that things has been going fine     Currently in Pain? Yes   Pain Score 2    Pain Location Knee   Pain Orientation Right                         [facility] Adult PT Treatment/Exercise - 04/14/15 0001    Knee/Hip Exercises: Aerobic   Stationary Bike x4 min R hip hike to make full revolutions   Nustep Level 5 x 6 minutes   Knee/Hip  Exercises: Machines for Strengthening   Cybex Knee Extension 5# 2x15   Cybex Knee Flexion RLE #10 2x15    Cybex Leg Press #20 2x15 X6 no weight working on stretch   Knee/Hip Exercises: Standing   Forward Step Up Right;3 sets;5 reps   Other Standing Knee Exercises TKE with ball on wall x15   Manual Therapy   Manual Therapy Soft tissue mobilization;Passive ROM;Joint mobilization   Joint Mobilization Patella mobs, Tibia femoral jt mobs    Soft tissue mobilization R hamstring, R quad    Passive ROM R knee Taken to end ranges and held                  PT Short Term Goals - 03/28/15 0844    PT SHORT TERM GOAL #1   Title independent with initial HEP   Status Achieved           PT Long Term Goals - 04/05/15 0833    PT LONG TERM GOAL #2   Title be independent with scar mobilization   Status Achieved   PT LONG TERM GOAL #5   Title decrease pain 50%   Status Achieved               Plan - 04/14/15  [id number]    Clinical Impression Statement Pt continues to perform well with exercises but remain guarded with MT. Pt ROM remain limited. Pt uses compensatory methods when it comes to bending R knee throughout session. One lap around room performed after each intervention focusing on heel strike with RLE.   Pt will benefit from skilled therapeutic intervention in order to improve on the following deficits Abnormal gait;Decreased mobility;Decreased range of motion;Difficulty walking;Decreased scar mobility;Decreased strength;Increased edema;Impaired flexibility;Pain   Rehab Potential Good   PT Frequency 2x / week   PT Duration 8 weeks   PT Treatment/Interventions Electrical Stimulation;Cryotherapy;Gait training;Stair training;Functional mobility training;Therapeutic exercise;Manual techniques;Vasopneumatic Device;Patient/family education;Passive range of motion;Balance training   PT Next Visit Plan ROM and gait         Problem List Patient Active Problem List   Diagnosis  Date Noted  . Primary osteoarthritis of right knee   . Knee pain, right 11/29/2014  . Excess skin of arm 11/29/2014  . Osteopenia 08/12/2013  . 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 Prediabetes 08/07/2011  . Vitamin D deficiency 08/07/2011  . Abnormal facial hair 03/29/2011  . PERSONAL HX COLONIC POLYPS 07/05/2010  . KNEE, ARTHRITIS, DEGEN./OSTEO 01/16/2010  . Vitamin B12 deficiency disease 10/28/2008  . 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 Obesity 08/15/2007  . Essential hypertension 08/15/2007    Scot Jun, PTA  04/14/2015, 8:55 AM  [place], [place], [address] Phone: [phone]   Fax:  [phone]  Name: Sheri Jones MRN: [id number] Date of Birth: Mar 31, 1946

## 2015-04-19 ENCOUNTER — Encounter (HOSPITAL_COMMUNITY): Payer: Medicare Other

## 2015-04-20 ENCOUNTER — Encounter (HOSPITAL_COMMUNITY): Payer: Self-pay

## 2015-04-20 ENCOUNTER — Ambulatory Visit (HOSPITAL_COMMUNITY): Payer: Federal, State, Local not specified - PPO

## 2015-04-20 ENCOUNTER — Encounter (HOSPITAL_COMMUNITY): Payer: Medicare Other | Attending: Hematology & Oncology

## 2015-04-20 VITALS — BP 135/73 | HR 85 | Temp 97.5°F | Resp 18

## 2015-04-20 DIAGNOSIS — E538 Deficiency of other specified B group vitamins: Secondary | ICD-10-CM

## 2015-04-20 MED ORDER — CYANOCOBALAMIN 1000 MCG/ML IJ SOLN
1000.0000 ug | Freq: Once | INTRAMUSCULAR | Status: AC
  Administered 2015-04-20: 1000 ug via INTRAMUSCULAR

## 2015-04-20 MED ORDER — CYANOCOBALAMIN 1000 MCG/ML IJ SOLN
INTRAMUSCULAR | Status: AC
  Filled 2015-04-20: qty 1

## 2015-04-20 NOTE — Progress Notes (Signed)
Sheri Jones presents today for injection per the provider's orders.  B12 administration without incident; see MAR for injection details.  Patient tolerated procedure well and without incident.  No questions or complaints noted at this time. 

## 2015-04-20 NOTE — Patient Instructions (Signed)
[facility] at [facility] Discharge Instructions  RECOMMENDATIONS MADE BY THE CONSULTANT AND ANY TEST RESULTS WILL BE SENT TO YOUR REFERRING PHYSICIAN.  B12 injection today. Return as scheduled for injections. Return as scheduled for lab work and office visit.  Thank you for choosing [facility] at [facility] to provide your oncology and hematology care.  To afford each patient quality time with our provider, please arrive at least 15 minutes before your scheduled appointment time.    You need to re-schedule your appointment should you arrive 10 or more minutes late.  We strive to give you quality time with our providers, and arriving late affects you and other patients whose appointments are after yours.  Also, if you no show three or more times for appointments you may be dismissed from the clinic at the providers discretion.     Again, thank you for choosing [facility].  Our hope is that these requests will decrease the amount of time that you wait before being seen by our physicians.       _____________________________________________________________  Should you have questions after your visit to [facility], please contact our office at [phone] between the hours of 8:30 a.m. and 4:30 p.m.  Voicemails left after 4:30 p.m. will not be returned until the following business day.  For prescription refill requests, have your pharmacy contact our office.    

## 2015-04-21 ENCOUNTER — Ambulatory Visit (INDEPENDENT_AMBULATORY_CARE_PROVIDER_SITE_OTHER): Payer: Federal, State, Local not specified - PPO | Admitting: Orthopedic Surgery

## 2015-04-21 ENCOUNTER — Ambulatory Visit: Payer: Federal, State, Local not specified - PPO | Admitting: Orthopedic Surgery

## 2015-04-21 VITALS — BP 150/88 | Ht 63.0 in | Wt 191.0 lb

## 2015-04-21 DIAGNOSIS — Z96651 Presence of right artificial knee joint: Secondary | ICD-10-CM

## 2015-04-21 DIAGNOSIS — M24661 Ankylosis, right knee: Secondary | ICD-10-CM

## 2015-04-21 NOTE — Patient Instructions (Addendum)
Manipulation under anesthesia Right knee, call to schedule, looking at 05/10/14  Hold therapy until after manipulation

## 2015-04-21 NOTE — Progress Notes (Signed)
Chief Complaint  Patient presents with  . Follow-up    2 week follow up Rt TKA, DOS 02/23/15     6 postop appointment 8 weeks after right total knee replacement. Patient has been struggling with her knee flexion.  Today I could only get her knee to 90 she has a slight flexion contracture as well.  She did cut back on her pain medicine because it was making her sick on the stomach.  Recommend manipulation under anesthesia of the patient's earliest convenience. She has to get some things ironed out at home so that she'll have help and have a ride to get to the hospital. We probably will keep her overnight for pain control and send her home with a CPM machine , she should hold hold any therapy until after the procedure

## 2015-04-22 ENCOUNTER — Ambulatory Visit: Payer: Federal, State, Local not specified - PPO | Admitting: Physical Therapy

## 2015-04-22 ENCOUNTER — Telehealth: Payer: Self-pay

## 2015-04-22 NOTE — Telephone Encounter (Signed)
Patient cancelled PT until after manipulation on 05/11/15

## 2015-04-26 ENCOUNTER — Ambulatory Visit: Payer: Federal, State, Local not specified - PPO | Admitting: Physical Therapy

## 2015-04-27 ENCOUNTER — Other Ambulatory Visit: Payer: Self-pay | Admitting: *Deleted

## 2015-04-28 ENCOUNTER — Ambulatory Visit: Payer: Federal, State, Local not specified - PPO | Admitting: Physical Therapy

## 2015-05-03 ENCOUNTER — Encounter: Payer: Medicare Other | Admitting: Family Medicine

## 2015-05-03 NOTE — Patient Instructions (Signed)
      Sheri Jones  05/03/2015     @PREFPERIOPPHARMACY @   Your procedure is scheduled on  05/11/2015   Report to [facility] at  615  A.M.  Call this number if you have problems the morning of surgery:  [phone]   Remember:  Do not eat food or drink liquids after midnight.  Take these medicines the morning of surgery with A SIP OF WATER zyrtec, hydrocodone.   Do not wear jewelry, make-up or nail polish.  Do not wear lotions, powders, or perfumes.  You may wear deodorant.  Do not shave 48 hours prior to surgery.  Men may shave face and neck.  Do not bring valuables to the hospital.  [facility] is not responsible for any belongings or valuables.  Contacts, dentures or bridgework may not be worn into surgery.  Leave your suitcase in the car.  After surgery it may be brought to your room.  For patients admitted to the hospital, discharge time will be determined by your treatment team.  Patients discharged the day of surgery will not be allowed to drive home.   Name and phone number of your driver:   Family  Special instructions:  none  Please read over the following fact sheets that you were given. Pain Booklet, Coughing and Deep Breathing, Surgical Site Infection Prevention, Anesthesia Post-op Instructions and Care and Recovery After Surgery      PATIENT INSTRUCTIONS POST-ANESTHESIA  IMMEDIATELY FOLLOWING SURGERY:  Do not drive or operate machinery for the first twenty four hours after surgery.  Do not make any important decisions for twenty four hours after surgery or while taking narcotic pain medications or sedatives.  If you develop intractable nausea and vomiting or a severe headache please notify your doctor immediately.  FOLLOW-UP:  Please make an appointment with your surgeon as instructed. You do not need to follow up with anesthesia unless specifically instructed to do so.  WOUND CARE INSTRUCTIONS (if applicable):  Keep a dry clean dressing on the  anesthesia/puncture wound site if there is drainage.  Once the wound has quit draining you may leave it open to air.  Generally you should leave the bandage intact for twenty four hours unless there is drainage.  If the epidural site drains for more than 36-48 hours please call the anesthesia department.  QUESTIONS?:  Please feel free to call your physician or the hospital operator if you have any questions, and they will be happy to assist you.

## 2015-05-06 ENCOUNTER — Encounter (HOSPITAL_COMMUNITY): Payer: Self-pay

## 2015-05-06 ENCOUNTER — Encounter (HOSPITAL_COMMUNITY)
Admission: RE | Admit: 2015-05-06 | Discharge: 2015-05-06 | Disposition: A | Discharge status: Home / Self Care | Payer: Federal, State, Local not specified - PPO | Source: Ambulatory Visit | Attending: Orthopedic Surgery | Admitting: Orthopedic Surgery

## 2015-05-06 DIAGNOSIS — Z01812 Encounter for preprocedural laboratory examination: Secondary | ICD-10-CM | POA: Diagnosis present

## 2015-05-06 DIAGNOSIS — Z96651 Presence of right artificial knee joint: Secondary | ICD-10-CM | POA: Diagnosis not present

## 2015-05-06 DIAGNOSIS — M25661 Stiffness of right knee, not elsewhere classified: Secondary | ICD-10-CM | POA: Diagnosis not present

## 2015-05-06 LAB — BASIC METABOLIC PANEL
ANION GAP: 8 (ref 5–15)
BUN: 16 mg/dL (ref 6–20)
CALCIUM: 9.3 mg/dL (ref 8.9–10.3)
CO2: 27 mmol/L (ref 22–32)
Chloride: 107 mmol/L (ref 101–111)
Creatinine, Ser: 0.96 mg/dL (ref 0.44–1.00)
GFR calc Af Amer: >60 mL/min (ref >60)
GFR, EST NON AFRICAN AMERICAN: 60 mL/min — AB (ref >60)
GLUCOSE: 67 mg/dL (ref 65–99)
Potassium: 3.9 mmol/L (ref 3.5–5.1)
SODIUM: 142 mmol/L (ref 135–145)

## 2015-05-06 LAB — CBC
HEMATOCRIT: 35.9 % — AB (ref 36.0–46.0)
Hemoglobin: 12.1 g/dL (ref 12.0–15.0)
MCH: 30 pg (ref 26.0–34.0)
MCHC: 33.7 g/dL (ref 30.0–36.0)
MCV: 88.9 fL (ref 78.0–100.0)
PLATELETS: 230 10*3/uL (ref 150–400)
RBC: 4.04 MIL/uL (ref 3.87–5.11)
RDW: 15.4 % (ref 11.5–15.5)
WBC: 5.4 10*3/uL (ref 4.0–10.5)

## 2015-05-10 NOTE — H&P (Signed)
Sheri Jones is an 70 y.o. female.    Chief Complaint: stiff right knee   HPI: 70 year old female had a total knee about 12 weeks ago a little less maybe presents with stiffness and failure to progress in rehabilitation. She did not tolerate her CPM machine well and she did not participate in therapy as well as we would like.  She does not have a lot of pain in the knee. There are no associated factors no modifying factors. She understands that she has risk of fracture  Past Medical History  Diagnosis Date  . Allergic rhinitis due to pollen   . Osteoarthrosis, unspecified whether generalized or localized, lower leg   . Obesity, unspecified   . Unspecified essential hypertension   . Anemia   . Diabetes mellitus without complication ([place])   . 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 Vitamin B12 deficiency disease 10/28/2008    Qualifier: Diagnosis of  By: Moshe Cipro MD, Joycelyn Schmid      Past Surgical History  Procedure Laterality Date  . Gastric bypass  2004  . Colonoscopy  2008  . Total knee arthroplasty Right 02/23/2015    Procedure: RIGHT TOTAL KNEE ARTHROPLASTY;  Surgeon: Sheri Civil, MD;  Location: [facility];  Service: Orthopedics;  Laterality: Right;    Family History  Problem Relation Age of Onset  . Hypertension Mother   . Hypertension Sister   . Hypertension Sister   . Stroke Sister 68  . Hypertension Sister   . Breast cancer Maternal Aunt   . Lung cancer Maternal Aunt   . Heart disease Father 56    massive heart attack    Social History:  reports that she has never smoked. She does not have any smokeless tobacco history on file. She reports that she does not drink alcohol or use illicit drugs.  Allergies:  Allergies  Allergen Reactions  . Eggs Or Egg-Derived Products Diarrhea  . Milk-Related Compounds     Lactose intolerant     No prescriptions prior to admission    No results found for this or any previous visit (from the past 48 hour(s)). No results  found.  Review of Systems  All other systems reviewed and are negative.   There were no vitals taken for this visit. Physical Exam  Constitutional: She is oriented to person, place, and time. She appears well-developed and well-nourished. No distress.  HENT:  Head: Normocephalic and atraumatic.  Eyes: Conjunctivae and EOM are normal. Pupils are equal, round, and reactive to light. Right eye exhibits no discharge. Left eye exhibits no discharge. No scleral icterus.  Neck: Normal range of motion. Neck supple. No JVD present. No tracheal deviation present. No thyromegaly present.  Cardiovascular: Normal rate and intact distal pulses.   Respiratory: No stridor. No respiratory distress.  GI: Soft. Bowel sounds are normal. She exhibits distension.  Musculoskeletal:  Right knee incision clean dry and intact no signs of infection no effusion. Alignment normal. Range of motion only 90 flexion L7 a flexion contracture. Motor exam normal knee stable neurovascular exam intact  Lymphadenopathy:    She has no cervical adenopathy.  Neurological: She is alert and oriented to person, place, and time. She has normal reflexes. She displays normal reflexes. She exhibits normal muscle tone.  Skin: Skin is warm. No rash noted. She is diaphoretic. No erythema. No pallor.  Psychiatric: She has a normal mood and affect. Her behavior is normal. Judgment and thought content normal.     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

## 2015-05-11 ENCOUNTER — Inpatient Hospital Stay (HOSPITAL_COMMUNITY): Payer: Federal, State, Local not specified - PPO | Admitting: Anesthesiology

## 2015-05-11 ENCOUNTER — Telehealth: Payer: Self-pay | Admitting: Orthopedic Surgery

## 2015-05-11 ENCOUNTER — Encounter (HOSPITAL_COMMUNITY): Payer: Self-pay | Admitting: *Deleted

## 2015-05-11 ENCOUNTER — Observation Stay (HOSPITAL_COMMUNITY)
Admission: RE | Admit: 2015-05-11 | Discharge: 2015-05-12 | Disposition: A | Discharge status: Home / Self Care | Payer: Federal, State, Local not specified - PPO | Source: Ambulatory Visit | Attending: Orthopedic Surgery | Admitting: Orthopedic Surgery

## 2015-05-11 ENCOUNTER — Encounter (HOSPITAL_COMMUNITY)
Admission: RE | Disposition: A | Discharge status: Home / Self Care | Payer: Self-pay | Source: Ambulatory Visit | Attending: Orthopedic Surgery

## 2015-05-11 DIAGNOSIS — T8482XD Fibrosis due to internal orthopedic prosthetic devices, implants and grafts, subsequent encounter: Secondary | ICD-10-CM | POA: Diagnosis not present

## 2015-05-11 DIAGNOSIS — I10 Essential (primary) hypertension: Secondary | ICD-10-CM | POA: Diagnosis not present

## 2015-05-11 DIAGNOSIS — Z96651 Presence of right artificial knee joint: Secondary | ICD-10-CM | POA: Diagnosis not present

## 2015-05-11 DIAGNOSIS — T8482XA Fibrosis due to internal orthopedic prosthetic devices, implants and grafts, initial encounter: Secondary | ICD-10-CM | POA: Diagnosis present

## 2015-05-11 DIAGNOSIS — Z9884 Bariatric surgery status: Secondary | ICD-10-CM | POA: Diagnosis not present

## 2015-05-11 DIAGNOSIS — E119 Type 2 diabetes mellitus without complications: Secondary | ICD-10-CM | POA: Diagnosis not present

## 2015-05-11 DIAGNOSIS — Z7982 Long term (current) use of aspirin: Secondary | ICD-10-CM | POA: Diagnosis not present

## 2015-05-11 DIAGNOSIS — M25661 Stiffness of right knee, not elsewhere classified: Principal | ICD-10-CM | POA: Insufficient documentation

## 2015-05-11 DIAGNOSIS — Z79899 Other long term (current) drug therapy: Secondary | ICD-10-CM | POA: Diagnosis not present

## 2015-05-11 DIAGNOSIS — Z7984 Long term (current) use of oral hypoglycemic drugs: Secondary | ICD-10-CM | POA: Diagnosis not present

## 2015-05-11 DIAGNOSIS — E669 Obesity, unspecified: Secondary | ICD-10-CM | POA: Insufficient documentation

## 2015-05-11 DIAGNOSIS — M1990 Unspecified osteoarthritis, unspecified site: Secondary | ICD-10-CM | POA: Diagnosis not present

## 2015-05-11 HISTORY — PX: EXAM UNDER ANESTHESIA WITH MANIPULATION OF KNEE: SHX5816

## 2015-05-11 LAB — GLUCOSE, CAPILLARY
Glucose-Capillary: 101 mg/dL — ABNORMAL HIGH (ref 65–99)
Glucose-Capillary: 126 mg/dL — ABNORMAL HIGH (ref 65–99)

## 2015-05-11 SURGERY — MANIPULATION, JOINT, KNEE, WITH ANESTHESIA
Anesthesia: General | Site: Knee | Laterality: Right

## 2015-05-11 MED ORDER — CHLORHEXIDINE GLUCONATE 4 % EX LIQD: 60.0000 mL | Freq: Once | CUTANEOUS | Status: DC

## 2015-05-11 MED ORDER — LACTATED RINGERS IV SOLN
INTRAVENOUS | Status: DC
  Administered 2015-05-11: 1000 mL via INTRAVENOUS

## 2015-05-11 MED ORDER — FENTANYL CITRATE (PF) 250 MCG/5ML IJ SOLN
INTRAMUSCULAR | Status: AC
  Filled 2015-05-11: qty 5

## 2015-05-11 MED ORDER — ALUM & MAG HYDROXIDE-SIMETH 200-200-20 MG/5ML PO SUSP: 30.0000 mL | ORAL | Status: DC | PRN

## 2015-05-11 MED ORDER — CELECOXIB 100 MG PO CAPS
200.0000 mg | ORAL_CAPSULE | Freq: Two times a day (BID) | ORAL | Status: DC
  Administered 2015-05-11 – 2015-05-12 (×2): 200 mg via ORAL
  Filled 2015-05-11 (×2): qty 2

## 2015-05-11 MED ORDER — METFORMIN HCL 500 MG PO TABS
500.0000 mg | ORAL_TABLET | Freq: Every day | ORAL | Status: DC
  Administered 2015-05-12: 500 mg via ORAL
  Filled 2015-05-11: qty 1

## 2015-05-11 MED ORDER — SUCCINYLCHOLINE CHLORIDE 20 MG/ML IJ SOLN
INTRAMUSCULAR | Status: DC | PRN
  Administered 2015-05-11: 100 mg via INTRAVENOUS

## 2015-05-11 MED ORDER — SENNA 8.6 MG PO TABS
1.0000 | ORAL_TABLET | Freq: Two times a day (BID) | ORAL | Status: DC
  Administered 2015-05-11 – 2015-05-12 (×3): 8.6 mg via ORAL
  Filled 2015-05-11 (×3): qty 1

## 2015-05-11 MED ORDER — ROCURONIUM BROMIDE 50 MG/5ML IV SOLN
INTRAVENOUS | Status: AC
  Filled 2015-05-11: qty 1

## 2015-05-11 MED ORDER — PROPOFOL 10 MG/ML IV BOLUS
INTRAVENOUS | Status: AC
  Filled 2015-05-11: qty 20

## 2015-05-11 MED ORDER — LIDOCAINE HCL (PF) 1 % IJ SOLN
INTRAMUSCULAR | Status: AC
  Filled 2015-05-11: qty 5

## 2015-05-11 MED ORDER — PROPOFOL 10 MG/ML IV BOLUS
INTRAVENOUS | Status: DC | PRN
  Administered 2015-05-11: 150 mg via INTRAVENOUS

## 2015-05-11 MED ORDER — OXYCODONE HCL 5 MG PO TABS: 5.0000 mg | ORAL_TABLET | ORAL | Status: DC | PRN

## 2015-05-11 MED ORDER — ONDANSETRON HCL 4 MG/2ML IJ SOLN
4.0000 mg | Freq: Four times a day (QID) | INTRAMUSCULAR | Status: DC | PRN
  Administered 2015-05-11: 4 mg via INTRAVENOUS
  Filled 2015-05-11: qty 2

## 2015-05-11 MED ORDER — OXYCODONE-ACETAMINOPHEN 7.5-325 MG PO TABS
1.0000 | ORAL_TABLET | ORAL | Status: DC
  Administered 2015-05-11 – 2015-05-12 (×7): 1 via ORAL
  Filled 2015-05-11 (×7): qty 1

## 2015-05-11 MED ORDER — HYDROMORPHONE HCL 1 MG/ML IJ SOLN
1.0000 mg | INTRAMUSCULAR | Status: DC | PRN
  Administered 2015-05-11: 1 mg via INTRAVENOUS
  Filled 2015-05-11: qty 1

## 2015-05-11 MED ORDER — ROCURONIUM BROMIDE 100 MG/10ML IV SOLN
INTRAVENOUS | Status: DC | PRN
  Administered 2015-05-11: 5 mg via INTRAVENOUS

## 2015-05-11 MED ORDER — ASPIRIN 81 MG PO CHEW
81.0000 mg | CHEWABLE_TABLET | Freq: Every day | ORAL | Status: DC
  Administered 2015-05-11 – 2015-05-12 (×2): 81 mg via ORAL
  Filled 2015-05-11 (×2): qty 1

## 2015-05-11 MED ORDER — ONDANSETRON HCL 4 MG PO TABS: 4.0000 mg | ORAL_TABLET | Freq: Four times a day (QID) | ORAL | Status: DC | PRN

## 2015-05-11 MED ORDER — CENTRUM ULTRA WOMENS PO TABS: ORAL_TABLET | Freq: Every day | ORAL | Status: DC

## 2015-05-11 MED ORDER — ONDANSETRON HCL 4 MG/2ML IJ SOLN
4.0000 mg | Freq: Once | INTRAMUSCULAR | Status: AC
  Administered 2015-05-11: 4 mg via INTRAVENOUS

## 2015-05-11 MED ORDER — SUCCINYLCHOLINE CHLORIDE 20 MG/ML IJ SOLN
INTRAMUSCULAR | Status: AC
  Filled 2015-05-11: qty 1

## 2015-05-11 MED ORDER — PREGABALIN 50 MG PO CAPS
50.0000 mg | ORAL_CAPSULE | Freq: Three times a day (TID) | ORAL | Status: DC
  Administered 2015-05-11 – 2015-05-12 (×4): 50 mg via ORAL
  Filled 2015-05-11 (×4): qty 1

## 2015-05-11 MED ORDER — METHOCARBAMOL 1000 MG/10ML IJ SOLN: 500.0000 mg | Freq: Four times a day (QID) | INTRAVENOUS | Status: DC | PRN

## 2015-05-11 MED ORDER — METHOCARBAMOL 1000 MG/10ML IJ SOLN
500.0000 mg | Freq: Once | INTRAVENOUS | Status: AC
  Administered 2015-05-11: 500 mg via INTRAVENOUS
  Filled 2015-05-11: qty 5

## 2015-05-11 MED ORDER — VITAMIN D (ERGOCALCIFEROL) 1.25 MG (50000 UNIT) PO CAPS: 50000.0000 [IU] | ORAL_CAPSULE | ORAL | Status: DC

## 2015-05-11 MED ORDER — LIDOCAINE HCL 1 % IJ SOLN
INTRAMUSCULAR | Status: DC | PRN
  Administered 2015-05-11: 30 mg via INTRADERMAL

## 2015-05-11 MED ORDER — METOCLOPRAMIDE HCL 5 MG/ML IJ SOLN: 5.0000 mg | Freq: Three times a day (TID) | INTRAMUSCULAR | Status: DC | PRN

## 2015-05-11 MED ORDER — PREGABALIN 50 MG PO CAPS
ORAL_CAPSULE | ORAL | Status: AC
  Filled 2015-05-11: qty 1

## 2015-05-11 MED ORDER — SODIUM CHLORIDE 0.9 % IV SOLN
INTRAVENOUS | Status: DC
  Administered 2015-05-11: 11:00:00 via INTRAVENOUS

## 2015-05-11 MED ORDER — ONDANSETRON HCL 4 MG/2ML IJ SOLN: 4.0000 mg | Freq: Once | INTRAMUSCULAR | Status: DC | PRN

## 2015-05-11 MED ORDER — CYANOCOBALAMIN 1000 MCG/ML IJ SOLN: 1000.0000 ug | INTRAMUSCULAR | Status: DC

## 2015-05-11 MED ORDER — POTASSIUM CHLORIDE ER 10 MEQ PO TBCR
10.0000 meq | EXTENDED_RELEASE_TABLET | Freq: Two times a day (BID) | ORAL | Status: DC
  Administered 2015-05-11 – 2015-05-12 (×3): 10 meq via ORAL
  Filled 2015-05-11 (×8): qty 1

## 2015-05-11 MED ORDER — FENTANYL CITRATE (PF) 100 MCG/2ML IJ SOLN: 25.0000 ug | INTRAMUSCULAR | Status: DC | PRN

## 2015-05-11 MED ORDER — CELECOXIB 400 MG PO CAPS: 400.0000 mg | ORAL_CAPSULE | Freq: Once | ORAL | Status: DC

## 2015-05-11 MED ORDER — ADULT MULTIVITAMIN W/MINERALS CH
1.0000 | ORAL_TABLET | Freq: Every day | ORAL | Status: DC
  Administered 2015-05-11 – 2015-05-12 (×2): 1 via ORAL
  Filled 2015-05-11 (×2): qty 1

## 2015-05-11 MED ORDER — PREGABALIN 50 MG PO CAPS
50.0000 mg | ORAL_CAPSULE | Freq: Once | ORAL | Status: AC
  Administered 2015-05-11: 50 mg via ORAL

## 2015-05-11 MED ORDER — MIDAZOLAM HCL 2 MG/2ML IJ SOLN
1.0000 mg | INTRAMUSCULAR | Status: DC | PRN
  Administered 2015-05-11: 2 mg via INTRAVENOUS

## 2015-05-11 MED ORDER — ONDANSETRON HCL 4 MG/2ML IJ SOLN
INTRAMUSCULAR | Status: AC
  Filled 2015-05-11: qty 2

## 2015-05-11 MED ORDER — CEFAZOLIN SODIUM-DEXTROSE 2-3 GM-% IV SOLR
2.0000 g | INTRAVENOUS | Status: AC
  Administered 2015-05-11: 2 g via INTRAVENOUS
  Filled 2015-05-11: qty 50

## 2015-05-11 MED ORDER — LORATADINE 10 MG PO TABS
10.0000 mg | ORAL_TABLET | Freq: Every day | ORAL | Status: DC
  Administered 2015-05-11 – 2015-05-12 (×2): 10 mg via ORAL
  Filled 2015-05-11 (×2): qty 1

## 2015-05-11 MED ORDER — METOCLOPRAMIDE HCL 10 MG PO TABS: 5.0000 mg | ORAL_TABLET | Freq: Three times a day (TID) | ORAL | Status: DC | PRN

## 2015-05-11 MED ORDER — CALCIUM CARBONATE-VITAMIN D 500-200 MG-UNIT PO TABS
1.0000 | ORAL_TABLET | Freq: Every day | ORAL | Status: DC
  Administered 2015-05-12: 1 via ORAL
  Filled 2015-05-11: qty 1

## 2015-05-11 MED ORDER — METHOCARBAMOL 500 MG PO TABS: 500.0000 mg | ORAL_TABLET | Freq: Four times a day (QID) | ORAL | Status: DC | PRN

## 2015-05-11 MED ORDER — MIDAZOLAM HCL 2 MG/2ML IJ SOLN
INTRAMUSCULAR | Status: AC
  Filled 2015-05-11: qty 2

## 2015-05-11 MED ORDER — ONDANSETRON HCL 4 MG/2ML IJ SOLN
4.0000 mg | Freq: Once | INTRAMUSCULAR | Status: AC
  Administered 2015-05-11: 4 mg via INTRAVENOUS
  Filled 2015-05-11: qty 2

## 2015-05-11 MED ORDER — OXYCODONE HCL 5 MG PO TABS
5.0000 mg | ORAL_TABLET | Freq: Once | ORAL | Status: AC
  Administered 2015-05-11: 5 mg via ORAL
  Filled 2015-05-11: qty 1

## 2015-05-11 MED ORDER — KETOROLAC TROMETHAMINE 15 MG/ML IJ SOLN
15.0000 mg | Freq: Four times a day (QID) | INTRAMUSCULAR | Status: AC
  Administered 2015-05-11 – 2015-05-12 (×4): 15 mg via INTRAVENOUS
  Filled 2015-05-11 (×4): qty 1

## 2015-05-11 MED ORDER — HYDROCHLOROTHIAZIDE 25 MG PO TABS
25.0000 mg | ORAL_TABLET | Freq: Every day | ORAL | Status: DC
  Administered 2015-05-11 – 2015-05-12 (×2): 25 mg via ORAL
  Filled 2015-05-11 (×2): qty 1

## 2015-05-11 MED ORDER — FENTANYL CITRATE (PF) 250 MCG/5ML IJ SOLN
INTRAMUSCULAR | Status: DC | PRN
  Administered 2015-05-11 (×2): 50 ug via INTRAVENOUS
  Administered 2015-05-11 (×2): 25 ug via INTRAVENOUS

## 2015-05-11 MED ORDER — POTASSIUM CHLORIDE CRYS ER 10 MEQ PO TBCR
10.0000 meq | EXTENDED_RELEASE_TABLET | Freq: Two times a day (BID) | ORAL | Status: DC
  Administered 2015-05-11 – 2015-05-12 (×3): 10 meq via ORAL
  Filled 2015-05-11 (×3): qty 1

## 2015-05-11 MED ORDER — DIPHENHYDRAMINE HCL 12.5 MG/5ML PO ELIX: 12.5000 mg | ORAL_SOLUTION | ORAL | Status: DC | PRN

## 2015-05-11 MED ORDER — CELECOXIB 400 MG PO CAPS
ORAL_CAPSULE | ORAL | Status: AC
  Filled 2015-05-11: qty 1

## 2015-05-11 MED ORDER — ETOMIDATE 2 MG/ML IV SOLN
INTRAVENOUS | Status: AC
  Filled 2015-05-11: qty 10

## 2015-05-11 MED ORDER — PHENOL 1.4 % MT LIQD: 1.0000 | OROMUCOSAL | Status: DC | PRN

## 2015-05-11 MED ORDER — CALCIUM-VITAMIN D 500-125 MG-UNIT PO TABS: 1.0000 | ORAL_TABLET | Freq: Every day | ORAL | Status: DC

## 2015-05-11 MED ORDER — MENTHOL 3 MG MT LOZG: 1.0000 | LOZENGE | OROMUCOSAL | Status: DC | PRN

## 2015-05-11 SURGICAL SUPPLY — 3 items
CLOTH BEACON ORANGE TIMEOUT ST (SAFETY) ×2 IMPLANT
KIT ROOM TURNOVER APOR (KITS) ×3 IMPLANT
PAD ARMBOARD 7.5X6 YLW CONV (MISCELLANEOUS) ×2 IMPLANT

## 2015-05-11 NOTE — Plan of Care (Signed)
Problem: Acute Rehab PT Goals(only PT should resolve) Goal: Patient Will Transfer Sit To/From Stand Pt will transfer sit to/from-stand with RW at [place] without loss-of-balance to demonstrate good safety awareness for independent mobility in home.     Goal: Pt Will Ambulate Pt will ambulate with LRAD at Supervision using a step-through pattern and equal step length for a distances greater than 524ft to demonstrate the ability to perform safe household distance ambulation at discharge.

## 2015-05-11 NOTE — Brief Op Note (Signed)
05/11/2015  7:49 AM  PATIENT:  Sheri Jones  70 y.o. female  PRE-OPERATIVE DIAGNOSIS:  right knee stiffness s/p total knee replacement  POST-OPERATIVE DIAGNOSIS:  right knee stiffness s/p total knee replacement  PROCEDURE:  Procedure(s): EXAM UNDER ANESTHESIA WITH MANIPULATION OF KNEE (Right)   Preop range of motion 5-85 under anesthesia postop range of motion 0-115  Details of procedure Site confirmation and marking was performed in the preop area Chart update was completed Gen. anesthesia was administered Knee manipulation was performed with gentle flexion and gentle extension including hyperextension. We noted significant crepitance throughout the did several rounds of manipulation and settled at 115 of flexion  SURGEON:  Surgeon(s) and Role:    * Carole Civil, MD - Primary  PHYSICIAN ASSISTANT:   ASSISTANTS: none   ANESTHESIA:   general  EBL:     BLOOD ADMINISTERED:none  DRAINS: none   LOCAL MEDICATIONS USED:  MARCAINE     SPECIMEN:  No Specimen  DISPOSITION OF SPECIMEN:  N/A  COUNTS:  YES  TOURNIQUET:  * No tourniquets in log *  DICTATION: .Dragon Dictation  PLAN OF CARE: Admit for overnight observation  PATIENT DISPOSITION:  PACU - hemodynamically stable.   Delay start of Pharmacological VTE agent (>24hrs) due to surgical blood loss or risk of bleeding: no

## 2015-05-11 NOTE — Anesthesia Preprocedure Evaluation (Addendum)
Anesthesia Evaluation  Patient identified by MRN, date of birth, ID band Patient awake    Reviewed: Allergy & Precautions, NPO status , Patient's Chart, lab work & pertinent test results  History of Anesthesia Complications Negative for: history of anesthetic complications  Airway Mallampati: II  TM Distance: >3 FB     Dental  (+) Missing,    Pulmonary    Pulmonary exam normal        Cardiovascular Exercise Tolerance: Good hypertension, Pt. on medications Normal cardiovascular exam     Neuro/Psych    GI/Hepatic   Endo/Other  diabetes, Type 2, Oral Hypoglycemic Agents  Renal/GU      Musculoskeletal   Abdominal (+) + obese,   Peds  Hematology   Anesthesia Other Findings   Reproductive/Obstetrics                            Anesthesia Physical Anesthesia Plan  ASA: III  Anesthesia Plan: General   Post-op Pain Management:    Induction: Intravenous  Airway Management Planned: Oral ETT  Additional Equipment:   Intra-op Plan:   Post-operative Plan: Extubation in OR  Informed Consent: I have reviewed the patients History and Physical, chart, labs and discussed the procedure including the risks, benefits and alternatives for the proposed anesthesia with the patient or authorized representative who has indicated his/her understanding and acceptance.   Dental advisory given  Plan Discussed with: CRNA  Anesthesia Plan Comments:        Anesthesia Quick Evaluation  

## 2015-05-11 NOTE — Transfer of Care (Signed)
Immediate Anesthesia Transfer of Care Note  Patient: Sheri Jones  Procedure(s) Performed: Procedure(s): EXAM UNDER ANESTHESIA WITH MANIPULATION OF KNEE (Right)  Patient Location: PACU  Anesthesia Type:General  Level of Consciousness: awake and alert   Airway & Oxygen Therapy: Patient Spontanous Breathing and Patient connected to face mask oxygen  Post-op Assessment: Report given to RN  Post vital signs: Reviewed and stable  Last Vitals:  Filed Vitals:   05/11/15 0725 05/11/15 0730  BP: 133/80 133/73  Temp:    Resp: 14 19    Complications: No apparent anesthesia complications

## 2015-05-11 NOTE — Anesthesia Postprocedure Evaluation (Signed)
Lat Anesthesia Post Note  Patient: AZELYN WINKER Late entry Procedure(s) Performed: Procedure(s) (LRB): EXAM UNDER ANESTHESIA WITH MANIPULATION OF KNEE (Right)  Patient location during evaluation: PACU Anesthesia Type: General Level of consciousness: awake and alert Pain management: pain level controlled Vital Signs Assessment: post-procedure vital signs reviewed and stable Respiratory status: spontaneous breathing Cardiovascular status: blood pressure returned to baseline Postop Assessment: no signs of nausea or vomiting Anesthetic complications: no    Last Vitals:  Filed Vitals:   05/11/15 0845 05/11/15 0900  BP: 161/66 127/69  Pulse: 83 82  Temp:    Resp: 16 15    Last Pain:  Filed Vitals:   05/11/15 0903  PainSc: 3                  Townes Fuhs

## 2015-05-11 NOTE — Evaluation (Signed)
Physical Therapy Evaluation Patient Details Name: Sheri Jones MRN: [id number] DOB: December 24, 1945 Today's Date: 05/11/2015   History of Present Illness  70yo black female who underwent R TKA on 02/23/15 comes back to [place] for a knee maniuplation under anesthesia. Pt has previously DC to home with HHPT and subsequent outpatient PT with a gradual arthrofibrosis of the R knee.   Clinical Impression  Pt is received semirecumbent in bed upon entry, asleep but easily woken. Pt remains drowsy, somnolent, and nauseated, but willing to participate and motivated to get started. Pt is A&Ox3 and pleasant. Pt demonstrating some limitations in strength, gross coordination, and ROM in the RLE as detailed in this note. Pt falls risk is high as evidenced by slow gait speed, poor forward reach, and altered posturing during transfers and mobility. Pt on RA with Vitals WNL. Patient presenting with impairment of strength, range of motion, balance, coordination, and activity tolerance, limiting ability to perform ADL and mobility tasks at  baseline level of function. Patient will benefit from skilled intervention to address the above impairments and limitations, in order to restore to prior level of function, improve patient safety upon discharge, and to decrease falls risk.       Follow Up Recommendations Outpatient PT    Equipment Recommendations  None recommended by PT    Recommendations for Other Services       Precautions / Restrictions Precautions Precautions: None Restrictions Weight Bearing Restrictions: Yes RLE Weight Bearing: Weight bearing as tolerated      Mobility  Bed Mobility Overal bed mobility: Independent                Transfers   Equipment used: Rolling walker (2 wheeled)             General transfer comment: requires near maximal effort.   Ambulation/Gait Ambulation/Gait assistance: Supervision Ambulation Distance (Feet): 200 Feet Assistive device: Rolling walker (2  wheeled)       General Gait Details: limited mostly by nausea, speed is good.   Stairs            Wheelchair Mobility    Modified Rankin (Stroke Patients Only)       Balance Overall balance assessment: No apparent balance deficits (not formally assessed)                                           Pertinent Vitals/Pain Pain Score: 8  Pain Location: R knee, stable, does not seem to change with most activity.  Pain Intervention(s): Limited activity within patient's tolerance;Monitored during session;Premedicated before session;Repositioned;Patient requesting pain meds-RN notified;Ice applied    Home Living Family/patient expects to be discharged to:: Private residence Living Arrangements: Alone Available Help at Discharge: Family;Available 24 hours/day Type of Home: House Home Access: Level entry     Home Layout: One level Home Equipment: Cane - single point;Tub bench      Prior Function Level of Independence: Independent         Comments: Walking without DME last week.      Hand Dominance   Dominant Hand: Right    Extremity/Trunk Assessment   Upper Extremity Assessment: Overall WFL for tasks assessed           Lower Extremity Assessment: Overall WFL for tasks assessed;RLE deficits/detail RLE Deficits / Details: Generalized weakness in quads and hamstrings, difficulty activating, with continued altered motor patterns as  demonstrated on POD1, likely chronic.        Communication   Communication: No difficulties  Cognition Arousal/Alertness: Lethargic;Suspect due to medications Behavior During Therapy: WFL for tasks assessed/performed Overall Cognitive Status: Within Functional Limits for tasks assessed                      General Comments      Exercises Other Exercises Other Exercises: Knee extension stretches in supine 10x10 seconds Other Exercises: Knee flexion stretches in supine 10x10 seconds Other Exercises:  LAQ c Min-ModA for full range 1x10 Other Exercises: Seated knee flexion, manually resisted, gravity assisted 1x10 (very weak)  Other Exercises: ROM Assesed: 115-0 degrees.       Assessment/Plan    PT Assessment Patient needs continued PT services  PT Diagnosis Difficulty walking;Generalized weakness   PT Problem List Decreased strength;Decreased range of motion;Decreased mobility;Decreased coordination  PT Treatment Interventions DME instruction;Gait training;Functional mobility training;Therapeutic activities;Therapeutic exercise;Balance training;Patient/family education   PT Goals (Current goals can be found in the Care Plan section) Acute Rehab PT Goals Patient Stated Goal: progress mobility and funciton.  PT Goal Formulation: With patient Time For Goal Achievement: 05/25/15 Potential to Achieve Goals: Good    Frequency 7X/week   Barriers to discharge        Co-evaluation               End of Session Equipment Utilized During Treatment: Gait belt Activity Tolerance: Patient tolerated treatment well;Patient limited by pain;Patient limited by lethargy (Patient was very nauseated, dry heaving at EOB for 10 miutes waiting for antiemetics to take effect. ) Patient left: in bed;in CPM Nurse Communication: Mobility status;Other (comment)    Functional Assessment Tool Used: Clinical Judgment  Functional Limitation: Changing and maintaining body position;Mobility: Walking and moving around Mobility: Walking and Moving Around Current Status [phone]): At least 20 percent but less than 40 percent impaired, limited or restricted Mobility: Walking and Moving Around Goal Status [phone]): At least 20 percent but less than 40 percent impaired, limited or restricted    Time: 1424-1521 PT Time Calculation (min) (ACUTE ONLY): 57 min   Charges:   PT Evaluation $PT Eval Low Complexity: 1 Procedure PT Treatments $Therapeutic Exercise: 8-22 mins $Therapeutic Activity: 8-22 mins   PT  G Codes:   PT G-Codes **NOT FOR INPATIENT CLASS** Functional Assessment Tool Used: Clinical Judgment  Functional Limitation: Changing and maintaining body position;Mobility: Walking and moving around Mobility: Walking and Moving Around Current Status [id number]): At least 20 percent but less than 40 percent impaired, limited or restricted Mobility: Walking and Moving Around Goal Status [phone]): At least 20 percent but less than 40 percent impaired, limited or restricted    Josetta Wigal C 05/11/2015, 3:39 PM  3:41 PM, 05/11/2015 Etta Grandchild, PT, DPT PRN Physical Therapist at [place] License # [id number] (wireless)  [phone] (mobile)

## 2015-05-11 NOTE — Telephone Encounter (Signed)
Regarding surgery scheduled 05/11/15 at [facility], [place], patient's primary insurer [employer], and hospital only coverage, Medicare, Part A - no pre-authorization required, as patient also has (for hospital, Part A Medicare); Medicare guidelines therefore apply.

## 2015-05-11 NOTE — Interval H&P Note (Signed)
History and Physical Interval Note:  05/11/2015 7:23 AM  Sheri Jones  has presented today for surgery, with the diagnosis of right knee stiffness s/p total knee replacement  The various methods of treatment have been discussed with the patient and family. After consideration of risks, benefits and other options for treatment, the patient has consented to  Procedure(s): EXAM UNDER ANESTHESIA WITH MANIPULATION OF KNEE (Right) as a surgical intervention .  The patient's history has been reviewed, patient examined, no change in status, stable for surgery.  I have reviewed the patient's chart and labs.  Questions were answered to the patient's satisfaction.     [name]

## 2015-05-11 NOTE — Op Note (Signed)
05/11/2015  7:49 AM  PATIENT:  Sheri Jones  70 y.o. female  PRE-OPERATIVE DIAGNOSIS:  right knee stiffness s/p total knee replacement  POST-OPERATIVE DIAGNOSIS:  right knee stiffness s/p total knee replacement  PROCEDURE:  Procedure(s): EXAM UNDER ANESTHESIA WITH MANIPULATION OF KNEE (Right)   Preop range of motion 5-85 under anesthesia postop range of motion 0-115  Details of procedure Site confirmation and marking was performed in the preop area Chart update was completed Gen. anesthesia was administered Knee manipulation was performed with gentle flexion and gentle extension including hyperextension. We noted significant crepitance throughout the did several rounds of manipulation and settled at 115 of flexion  SURGEON:  Surgeon(s) and Role:    * Carole Civil, MD - Primary  PHYSICIAN ASSISTANT:   ASSISTANTS: none   ANESTHESIA:   general  EBL:     BLOOD ADMINISTERED:none  DRAINS: none   LOCAL MEDICATIONS USED:  MARCAINE     SPECIMEN:  No Specimen  DISPOSITION OF SPECIMEN:  N/A  COUNTS:  YES  TOURNIQUET:  * No tourniquets in log *  DICTATION: .Dragon Dictation  PLAN OF CARE: Admit for overnight observation  PATIENT DISPOSITION:  PACU - hemodynamically stable.   Delay start of Pharmacological VTE agent (>24hrs) due to surgical blood loss or risk of bleeding: no

## 2015-05-11 NOTE — Anesthesia Procedure Notes (Signed)
Procedure Name: Intubation Date/Time: 05/11/2015 7:42 AM Performed by: Tressie Stalker E Pre-anesthesia Checklist: Patient identified, Patient being monitored, Timeout performed, Emergency Drugs available and Suction available Patient Re-evaluated:Patient Re-evaluated prior to inductionOxygen Delivery Method: Circle System Utilized Preoxygenation: Pre-oxygenation with 100% oxygen Intubation Type: IV induction Ventilation: Mask ventilation without difficulty Laryngoscope Size: Mac and 3 Grade View: Grade I Tube type: Oral Tube size: 7.0 mm Number of attempts: 1 Airway Equipment and Method: Stylet Placement Confirmation: ETT inserted through vocal cords under direct vision,  positive ETCO2 and breath sounds checked- equal and bilateral Secured at: 21 cm Tube secured with: Tape Dental Injury: Teeth and Oropharynx as per pre-operative assessment

## 2015-05-11 NOTE — Clinical Social Work Note (Addendum)
CSW received consult for possible SNF. PT has evaluated pt and plan is for return home with outpatient PT. Will sign off, but can be reconsulted if needed.  Benay Pike, [place]

## 2015-05-12 ENCOUNTER — Encounter (HOSPITAL_COMMUNITY): Payer: Self-pay | Admitting: Orthopedic Surgery

## 2015-05-12 ENCOUNTER — Telehealth: Payer: Self-pay | Admitting: Orthopedic Surgery

## 2015-05-12 DIAGNOSIS — M25661 Stiffness of right knee, not elsewhere classified: Secondary | ICD-10-CM | POA: Diagnosis not present

## 2015-05-12 LAB — BASIC METABOLIC PANEL
ANION GAP: 6 (ref 5–15)
BUN: 15 mg/dL (ref 6–20)
CALCIUM: 8.3 mg/dL — AB (ref 8.9–10.3)
CO2: 28 mmol/L (ref 22–32)
Chloride: 107 mmol/L (ref 101–111)
Creatinine, Ser: 0.86 mg/dL (ref 0.44–1.00)
GFR calc Af Amer: >60 mL/min (ref >60)
GLUCOSE: 89 mg/dL (ref 65–99)
Potassium: 3.8 mmol/L (ref 3.5–5.1)
Sodium: 141 mmol/L (ref 135–145)

## 2015-05-12 MED ORDER — METHOCARBAMOL 500 MG PO TABS: 500.0000 mg | ORAL_TABLET | Freq: Four times a day (QID) | ORAL | Status: DC | PRN

## 2015-05-12 MED ORDER — OXYCODONE-ACETAMINOPHEN 7.5-325 MG PO TABS: 1.0000 | ORAL_TABLET | ORAL | Status: DC | PRN

## 2015-05-12 NOTE — Care Management Obs Status (Signed)
[place] NOTIFICATION   Patient Details  Name: Sheri Jones MRN: [id number] Date of Birth: October 07, 1945   Medicare Observation Status Notification Given:  No (Pt's DC'd <24hrs)    Sherald Barge, RN 05/12/2015, 2:19 PM

## 2015-05-12 NOTE — Telephone Encounter (Signed)
Case manager Janett Billow called back to further clarify - states patient cannot have BOTH Home health and Out-patient orders for therapy.  States if Dr Aline Brochure requests [facility], then he will need to place an order for it. Her ph# is [phone]

## 2015-05-12 NOTE — Telephone Encounter (Signed)
Received call from Janett Billow, case manager at [facility], requests to speak with Dr Aline Brochure regarding discharge orders; states has no discharge summary as of yet, and has questions, including patient not wanting to go home with CPM.  Please call her at direct ph#[phone]

## 2015-05-12 NOTE — Discharge Summary (Signed)
Physician Discharge Summary  Patient ID: Sheri Jones MRN: [id number] DOB/AGE: 09/12/1945 70 y.o.  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 She was on observation status and discharged on January 19 when she tolerated CPM well on Percocet 7.5 mg. She tolerated physical therapy.  On discharge did not want to take the CPM machine home.  I did not agree with this and insisted on it. Whether she uses her not is a different story. She will have therapy scheduled for next week at [place] as an outpatient  Discharge Exam: Blood pressure 117/66, pulse 74, temperature 98 F (36.7 C), temperature source Oral, resp. rate 18, SpO2 98 %. She is awake alert and oriented 3 this morning she is in the CPM machine tolerating it well at 120 although we are probably only getting 110 of active flexion  Disposition: 06-Home-Health Care Svc  Discharge Instructions    CPM    Complete by:  As directed   Continuous passive motion machine (CPM):    0-120 12 hrs per day x 3 weeks     Call MD / Call 911    Complete by:  As directed   If you experience chest pain or shortness of breath, CALL 911 and be transported to the hospital emergency room.  If you develope a fever above 101 F, pus (white drainage) or increased drainage or redness at the wound, or calf pain, call your surgeon's office.     Constipation Prevention    Complete by:  As directed   Drink plenty of fluids.   Prune juice may be helpful.  You may use a stool softener, such as Colace (over the counter) 100 mg twice a day.  Use MiraLax (over the counter) for constipation as needed.     Diet - low sodium heart healthy    Complete by:  As directed      Discharge instructions    Complete by:  As directed   cpm 12 hrs a day 0-120 x 3 weeks     Do not put a pillow under the knee. Place it under the heel.    Complete by:  As directed      Increase activity slowly as tolerated    Complete by:  As directed             Medication List    STOP taking these medications        HYDROcodone-acetaminophen 10-325 MG tablet  Commonly known as:  NORCO      TAKE these medications        aspirin 81 MG tablet  Take 81 mg by mouth daily.     Calcium-Vitamin D 500-125 MG-UNIT Tabs  Take 1 tablet by mouth daily.     CENTRUM ULTRA WOMENS PO  Take 1 tablet by mouth daily.     cetirizine 10 MG tablet  Commonly known as:  ZYRTEC  Take 10 mg by mouth daily.     cyanocobalamin 1000 MCG/ML injection  Commonly known  as:  (VITAMIN B-12)  Inject 1,000 mcg into the muscle every 30 (thirty) days.     hydrochlorothiazide 25 MG tablet  Commonly known as:  HYDRODIURIL  TAKE 1 TABLET BY MOUTH EVERY DAY     KLOR-CON 10 10 MEQ tablet  Generic drug:  potassium chloride  TAKE 1 TABLET BY MOUTH TWICE A DAY     metFORMIN 500 MG tablet  Commonly known as:  GLUCOPHAGE  TAKE 1 TABLET (500 MG TOTAL) BY MOUTH DAILY WITH BREAKFAST.     methocarbamol 500 MG tablet  Commonly known as:  ROBAXIN  Take 1 tablet (500 mg total) by mouth every 6 (six) hours as needed for muscle spasms.     oxyCODONE-acetaminophen 7.5-325 MG tablet  Commonly known as:  PERCOCET  Take 1 tablet by mouth every 4 (four) hours as needed for severe pain.     Vitamin D (Ergocalciferol) 50000 units Caps capsule  Commonly known as:  DRISDOL  TAKE ONE CAPSULE BY MOUTH ONCE A WEEK           Follow-up Information    Follow up with [name], MD.   Specialties:  Orthopedic Surgery, Radiology   Contact information:   [address] [place] [address] [phone]       Signed: [name] 05/12/2015, 11:29 AM

## 2015-05-12 NOTE — Addendum Note (Signed)
Addendum  created 05/12/15 1023 by Vista Deck, CRNA   Modules edited: Notes Section   Notes Section:  File: [id number]

## 2015-05-12 NOTE — Discharge Instructions (Addendum)
DO NOT DRIVE A CAR UNTIL IT IS APPROVED BY DR. HARRISON  DR. HARRISON'S OFFICE WILL MAKE YOUR PHYSICAL THERAPY APPOINTMENT AND WILL CONTACT YOU  FOLLOW UP WITH DR. HARRISON AS SCHEDULED  TAKE ALL MEDICATIONS AS DIRECTED  CALL DR. HARRISON FOR ANY QUESTIONS YOU MAY HAVE

## 2015-05-12 NOTE — Progress Notes (Signed)
OT Cancellation Note  Patient Details Name: MARIUM PETRI MRN: [id number] DOB: 04-02-1946   Cancelled Treatment:     Reason evaluation not completed: Pt screened, no OT needs identified at this time.   Guadelupe Sabin, OTR/L  [phone] 05/12/2015, 9:05 AM

## 2015-05-12 NOTE — Care Management Note (Signed)
Case Management Note  Patient Details  Name: Sheri Jones MRN: [id number] Date of Birth: [birth year]  Subjective/Objective:                  Pt is from home, admitted after knee surgery. Pt needs CPM, per pt should would like DME from [facility]. Pt is reluctant to use CPM at home, emphasized to pt that she MUST wear CPM as prescribed. Per MD pt will need to resume OP PT next week and his office will make arrangements. DC summary included HH has well, no HH ordered and pt does not wish to have [facility]. Call left at MD office to clarify what MD's wishes are.   Action/Plan: Romualdo Bolk, of [facility], made aware of referral and will obtain pt info from chart and have DME delivered to pt home after DC. No further CM needs. Pt understands to f/u with MD office on PT needs.   Expected Discharge Date:                  Expected Discharge Plan:  Home/Self Care  In-House Referral:  NA  Discharge planning Services  CM Consult  Post Acute Care Choice:  Durable Medical Equipment Choice offered to:  Patient  DME Arranged:  CPM DME Agency:  [place]:    [place]:     Status of Service:  Completed, signed off  Medicare Important Message Given:    Date Medicare IM Given:    Medicare IM give by:    Date Additional Medicare IM Given:    Additional Medicare Important Message give by:     If discussed at [place] of Stay Meetings, dates discussed:    Additional Comments:  Sherald Barge, RN 05/12/2015, 2:19 PM

## 2015-05-12 NOTE — Anesthesia Postprocedure Evaluation (Signed)
Anesthesia Post Note  Patient: Sheri Jones  Procedure(s) Performed: Procedure(s) (LRB): EXAM UNDER ANESTHESIA WITH MANIPULATION OF KNEE (Right)  Patient location during evaluation: Nursing Unit Anesthesia Type: General Level of consciousness: awake and alert Pain management: pain level controlled Vital Signs Assessment: post-procedure vital signs reviewed and stable Respiratory status: spontaneous breathing Cardiovascular status: blood pressure returned to baseline Anesthetic complications: no    Last Vitals:  Filed Vitals:   05/12/15 0058 05/12/15 0454  BP: 100/69 117/66  Pulse: 70 74  Temp: 36.5 C 36.7 C  Resp: 18 18    Last Pain:  Filed Vitals:   05/12/15 0456  PainSc: 2                  Balinda Heacock

## 2015-05-12 NOTE — Progress Notes (Signed)
Physical Therapy Treatment Patient Details Name: Sheri Jones MRN: [id number] DOB: 1946/03/26 Today's Date: 05/12/2015    History of Present Illness 70yo black female who underwent R TKA on 02/23/15 comes back to [place] for a knee maniuplation under anesthesia. Pt has previously DC to home with HHPT and subsequent outpatient PT with a gradual arthrofibrosis of the R knee.     PT Comments    Reviewed HEP stretches and quads/hamstrings exercises with pt and niece to prepare for DC. Pt is agreeable to perform 5x daily. Significant improved performance today in ambulation, mobility, balance, gait, and alertness. Pt with better controlled pain and free of nausea. No recommendations available on CPM use at this time, still awaiting instruction from ortho. Pt likely to DC later today.   Follow Up Recommendations  Outpatient PT     Equipment Recommendations  None recommended by PT    Recommendations for Other Services       Precautions / Restrictions Precautions Precautions: None Restrictions Weight Bearing Restrictions: Yes RLE Weight Bearing: Weight bearing as tolerated    Mobility  Bed Mobility Overal bed mobility: Independent                Transfers Overall transfer level: Independent Equipment used: None Transfers: Sit to/from Stand              Ambulation/Gait Ambulation/Gait assistance: Supervision Ambulation Distance (Feet): 500 Feet Assistive device: None       General Gait Details: gait with increasing symmetry with distance   Stairs            Wheelchair Mobility    Modified Rankin (Stroke Patients Only)       Balance Overall balance assessment: Modified Independent;No apparent balance deficits (not formally assessed)                                  Cognition Arousal/Alertness: Awake/alert Behavior During Therapy: WFL for tasks assessed/performed Overall Cognitive Status: Within Functional Limits for tasks assessed                       Exercises Other Exercises Other Exercises: Standing lunge stretch (foot in chair) for knee flexion 3x30 sec  Other Exercises: LAQ 1x10x3sec hold  Other Exercises: Seated knee flexion 1x10sec hold  Other Exercises: 5-125*     General Comments        Pertinent Vitals/Pain Pain Assessment: 0-10 Pain Score: 5  Pain Location: R knee, pain stable with all activity.  Pain Intervention(s): Limited activity within patient's tolerance;Monitored during session;Premedicated before session;Repositioned    Home Living                      Prior Function            PT Goals (current goals can now be found in the care plan section) Acute Rehab PT Goals Patient Stated Goal: progress mobility and funciton.  PT Goal Formulation: With patient Time For Goal Achievement: 05/25/15 Potential to Achieve Goals: Good Progress towards PT goals: Progressing toward goals    Frequency  7X/week    PT Plan Current plan remains appropriate    Co-evaluation             End of Session Equipment Utilized During Treatment: Gait belt Activity Tolerance: Patient tolerated treatment well;No increased pain Patient left: in CPM;in chair;with call bell/phone within reach;with family/visitor present  Time: [id number] PT Time Calculation (min) (ACUTE ONLY): 25 min  Charges:  $Gait Training: 8-22 mins $Therapeutic Exercise: 8-22 mins                    G Codes:      10:24 AM, 05/15/2015 Etta Grandchild, PT, DPT PRN Physical Therapist at [place] License # [id number] (wireless)  [phone] (mobile)

## 2015-05-13 ENCOUNTER — Other Ambulatory Visit: Payer: Self-pay | Admitting: *Deleted

## 2015-05-13 DIAGNOSIS — Z9889 Other specified postprocedural states: Secondary | ICD-10-CM

## 2015-05-17 ENCOUNTER — Encounter (HOSPITAL_COMMUNITY): Payer: Medicare Other | Attending: Hematology & Oncology

## 2015-05-17 VITALS — BP 163/88 | HR 85 | Temp 97.9°F | Resp 18

## 2015-05-17 DIAGNOSIS — E538 Deficiency of other specified B group vitamins: Secondary | ICD-10-CM | POA: Diagnosis not present

## 2015-05-17 MED ORDER — CYANOCOBALAMIN 1000 MCG/ML IJ SOLN
INTRAMUSCULAR | Status: AC
  Filled 2015-05-17: qty 1

## 2015-05-17 MED ORDER — CYANOCOBALAMIN 1000 MCG/ML IJ SOLN
1000.0000 ug | Freq: Once | INTRAMUSCULAR | Status: AC
  Administered 2015-05-17: 1000 ug via INTRAMUSCULAR

## 2015-05-17 NOTE — Patient Instructions (Signed)
[place] at [facility] Discharge Instructions  RECOMMENDATIONS MADE BY THE CONSULTANT AND ANY TEST RESULTS WILL BE SENT TO YOUR REFERRING PHYSICIAN.  B12 today.   Please return as scheduled.    Thank you for choosing [place] at [facility] to provide your oncology and hematology care.  To afford each patient quality time with our provider, please arrive at least 15 minutes before your scheduled appointment time.   Beginning January 23rd 2017 lab work for the [facility] will be done in the  Main lab at [facility] on 1st floor. If you have a lab appointment with the [facility] please come in thru the  Main Entrance and check in at the main information desk  You need to re-schedule your appointment should you arrive 10 or more minutes late.  We strive to give you quality time with our providers, and arriving late affects you and other patients whose appointments are after yours.  Also, if you no show three or more times for appointments you may be dismissed from the clinic at the providers discretion.     Again, thank you for choosing [facility].  Our hope is that these requests will decrease the amount of time that you wait before being seen by our physicians.       _____________________________________________________________  Should you have questions after your visit to [facility], please contact our office at [phone] between the hours of 8:30 a.m. and 4:30 p.m.  Voicemails left after 4:30 p.m. will not be returned until the following business day.  For prescription refill requests, have your pharmacy contact our office.

## 2015-05-17 NOTE — Progress Notes (Signed)
Sheri Jones presents today for injection per MD orders. B12 1000 mcg administered SQ in right Gluteal. Administration without incident. Patient tolerated well.  

## 2015-05-19 ENCOUNTER — Ambulatory Visit (INDEPENDENT_AMBULATORY_CARE_PROVIDER_SITE_OTHER): Payer: Self-pay | Admitting: Orthopedic Surgery

## 2015-05-19 VITALS — BP 128/85 | Ht 63.0 in | Wt 181.0 lb

## 2015-05-19 DIAGNOSIS — Z96651 Presence of right artificial knee joint: Secondary | ICD-10-CM

## 2015-05-19 DIAGNOSIS — M24661 Ankylosis, right knee: Secondary | ICD-10-CM

## 2015-05-19 NOTE — Progress Notes (Signed)
Chief Complaint  Patient presents with  . Follow-up    post op 1, Rt knee MUA, DOS 05/11/15    Postop visit 1 status post manipulation of the right knee. Index procedure was 02/23/2015  Patient was released from the hospital on January 19 presents today on January 26 and has not been in therapy. She says that they couldn't get her in. But she didn't call. She's been in her CPM she decreased it on her own to 6 hours a day versus 12. Today her knee is right back where it was prior to manipulation which is not surprising. She says the knee feels better in that she's bending it.  At this point were at impass she can try therapy follow-up 6 weeks we'll see where we are. I will check her motion at that time any problems let us know

## 2015-05-23 ENCOUNTER — Encounter: Payer: Self-pay | Admitting: Physical Therapy

## 2015-05-23 ENCOUNTER — Ambulatory Visit: Payer: Federal, State, Local not specified - PPO | Attending: Orthopedic Surgery | Admitting: Physical Therapy

## 2015-05-23 DIAGNOSIS — M25669 Stiffness of unspecified knee, not elsewhere classified: Secondary | ICD-10-CM

## 2015-05-23 DIAGNOSIS — R262 Difficulty in walking, not elsewhere classified: Secondary | ICD-10-CM | POA: Diagnosis present

## 2015-05-23 DIAGNOSIS — M24669 Ankylosis, unspecified knee: Secondary | ICD-10-CM | POA: Insufficient documentation

## 2015-05-23 NOTE — Therapy (Signed)
[place], [place], [address] Phone: [phone]   Fax:  [phone]  Physical Therapy Evaluation  Patient Details  Name: Sheri Jones MRN: [id number] Date of Birth: 1945-07-02 Referring Provider: [name]  Encounter Date: 05/23/2015      PT End of Session - 05/23/15 1444    Visit Number 1   Date for PT Re-Evaluation 07/21/15   PT Start Time 1355   PT Stop Time 1500   PT Time Calculation (min) 65 min   Activity Tolerance Patient tolerated treatment well   Behavior During Therapy [employer] for tasks assessed/performed      Past Medical History  Diagnosis Date  . Allergic rhinitis due to pollen   . Osteoarthrosis, unspecified whether generalized or localized, lower leg   . Obesity, unspecified   . Unspecified essential hypertension   . Anemia   . Diabetes mellitus without complication ([place])   . 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 Vitamin B12 deficiency disease 10/28/2008    Qualifier: Diagnosis of  By: Moshe Cipro MD, Joycelyn Schmid      Past Surgical History  Procedure Laterality Date  . Gastric bypass  2004  . Colonoscopy  2008  . Total knee arthroplasty Right 02/23/2015    Procedure: RIGHT TOTAL KNEE ARTHROPLASTY;  Surgeon: Carole Civil, MD;  Location: [facility];  Service: Orthopedics;  Laterality: Right;  . Exam under anesthesia with manipulation of knee Right 05/11/2015    Procedure: EXAM UNDER ANESTHESIA WITH MANIPULATION OF KNEE;  Surgeon: Carole Civil, MD;  Location: [facility];  Service: Orthopedics;  Laterality: Right;    There were no vitals filed for this visit.  Visit Diagnosis:  Decreased range of motion (ROM) of knee  Difficulty walking      Subjective Assessment - 05/23/15 1357    Subjective Pt states she feels like her knee is "locked up again" after recent surgery.    Limitations Walking   Patient Stated Goals get back to moving normal   Currently in  Pain? Yes   Pain Score 5    Pain Location Knee   Pain Orientation Right   Pain Descriptors / Indicators Aching;Throbbing   Pain Type Surgical pain   Pain Onset 1 to 4 weeks ago   Pain Frequency Intermittent   Aggravating Factors  knee flexion   Pain Relieving Factors pain meds, rest            [facility] PT Assessment - 05/23/15 0001    Assessment   Medical Diagnosis s/p right TKR with manipulation   Referring Provider [name]   Onset Date/Surgical Date 05/11/15   Prior Therapy home   Precautions   Precautions None   Balance Screen   Has the patient fallen in the past 6 months No   Has the patient had a decrease in activity level because of a fear of falling?  No   Is the patient reluctant to leave their home because of a fear of falling?  No   Home Environment   Additional Comments single level home, does the housework, does some gardening   Prior Function   Level of Independence Independent   Vocation Retired   Leisure walked daily 3045 minutes   ROM / Strength   AROM / PROM / Strength AROM;PROM;Strength   AROM   Overall AROM Comments right knee extension 13, right knee flexion 80   PROM   Overall PROM Comments right extension  7 degrees from full, PROM of right knee flexion 90   Strength   Overall Strength Comments knee extension 3+/5, knee flexion 4+/5 with hip flexion compensation                   [facility] Adult PT Treatment/Exercise - 05/29/15 0001    Knee/Hip Exercises: Aerobic   Nustep level 6 x 5 minutes   Knee/Hip Exercises: Machines for Strengthening   Cybex Knee Extension 5# 2x15   Cybex Knee Flexion #25 2 x 15   Cybex Leg Press 20# 2x10, x10 no weight to work on stretch   Modalities   Modalities Vasopneumatic   Vasopneumatic   Number Minutes Vasopneumatic  15 minutes   Vasopnuematic Location  Knee   Vasopneumatic Pressure Medium   Vasopneumatic Temperature  34                PT Education - 05/29/15 1451    Education  provided Yes   Education Details seated knee flexion and extension stretches   Person(s) Educated Patient   Methods Explanation;Demonstration;Handout   Comprehension Verbalized understanding          PT Short Term Goals - May 29, 2015 1452    PT SHORT TERM GOAL #1   Title Pt will be independent with initial HEP   Time 1   Period Weeks   Status New           PT Long Term Goals - 29-May-2015 1452    PT LONG TERM GOAL #1   Title Pt will increase right knee AROM to 5-110.   Time 6   Period Weeks   Status New   PT LONG TERM GOAL #2   Title Pt will walk 500 feet with minimal deviation.   Time 6   Period Weeks   Status New   PT LONG TERM GOAL #3   Title Pt will report no more than 2/10 pain with active knee flexion.   Time 6   Period Weeks   Status New               Plan - 05/29/15 1448    Clinical Impression Statement Pt is guarded with knee flexion and extension and has some hip flexion compensation during right knee flexion. Pt was able to tolerate some light exercises and PROM today.   Pt will benefit from skilled therapeutic intervention in order to improve on the following deficits Abnormal gait;Decreased range of motion;Difficulty walking;Pain;Decreased strength   Rehab Potential Good   PT Frequency 3x / week   PT Duration 6 weeks   PT Treatment/Interventions Cryotherapy;Electrical Stimulation;Moist Heat;Therapeutic exercise;Therapeutic activities;Functional mobility training;Gait training;Ultrasound;Patient/family education;Manual techniques;Vasopneumatic Device;Passive range of motion   PT Next Visit Plan progress strengthening exercises, PROM, TKE   PT Home Exercise Plan seated knee flexion and extension stretches   Consulted and Agree with Plan of Care Patient          G-Codes - 05-29-15 1436    Functional Assessment Tool Used Foto   Functional Limitation Mobility: Walking and moving around   Mobility: Walking and Moving Around Current Status [phone]) At  least 60 percent but less than 80 percent impaired, limited or restricted   Mobility: Walking and Moving Around Goal Status [phone]) At least 40 percent but less than 60 percent impaired, limited or restricted       Problem List Patient Active Problem List   Diagnosis Date Noted  . Arthrofibrosis of total knee arthroplasty ([place]) 05/11/2015  . Primary  osteoarthritis of right knee   . Knee pain, right 11/29/2014  . Excess skin of arm 11/29/2014  . Osteopenia 08/12/2013  . 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 Prediabetes 08/07/2011  . Vitamin D deficiency 08/07/2011  . Abnormal facial hair 03/29/2011  . PERSONAL HX COLONIC POLYPS 07/05/2010  . KNEE, ARTHRITIS, DEGEN./OSTEO 01/16/2010  . Vitamin B12 deficiency disease 10/28/2008  . 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 Obesity 08/15/2007  . Essential hypertension 08/15/2007    Barbette Merino, SPT 05/23/2015, 3:04 PM  [place], [place], [address] Phone: [phone]   Fax:  [phone]  Name: Sheri Jones MRN: [id number] Date of Birth: June 22, 1945

## 2015-05-26 ENCOUNTER — Encounter: Payer: Self-pay | Admitting: Physical Therapy

## 2015-05-26 ENCOUNTER — Ambulatory Visit: Payer: Federal, State, Local not specified - PPO | Attending: Orthopedic Surgery | Admitting: Physical Therapy

## 2015-05-26 DIAGNOSIS — M25669 Stiffness of unspecified knee, not elsewhere classified: Secondary | ICD-10-CM

## 2015-05-26 DIAGNOSIS — M7989 Other specified soft tissue disorders: Secondary | ICD-10-CM | POA: Diagnosis present

## 2015-05-26 DIAGNOSIS — R262 Difficulty in walking, not elsewhere classified: Secondary | ICD-10-CM

## 2015-05-26 DIAGNOSIS — M24669 Ankylosis, unspecified knee: Secondary | ICD-10-CM | POA: Diagnosis present

## 2015-05-26 DIAGNOSIS — M25661 Stiffness of right knee, not elsewhere classified: Secondary | ICD-10-CM | POA: Diagnosis present

## 2015-05-26 DIAGNOSIS — M25561 Pain in right knee: Secondary | ICD-10-CM | POA: Diagnosis present

## 2015-05-26 NOTE — Therapy (Signed)
[address] [place], [place], [address] Phone: [phone]   Fax:  [phone]  Physical Therapy Treatment  Patient Details  Name: Sheri Jones MRN: [id number] Date of Birth: 10/11/1945 Referring Provider: [name]  Encounter Date: 05/26/2015      PT End of Session - 05/26/15 1058    Visit Number 2   Date for PT Re-Evaluation 07/21/15   PT Start Time 1014   PT Stop Time 1113   PT Time Calculation (min) 59 min   Activity Tolerance Patient tolerated treatment well;Patient limited by pain   Behavior During Therapy [facility] for tasks assessed/performed      Past Medical History  Diagnosis Date  . Allergic rhinitis due to pollen   . Osteoarthrosis, unspecified whether generalized or localized, lower leg   . Obesity, unspecified   . Unspecified essential hypertension   . Anemia   . Diabetes mellitus without complication ([place])   . 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 Vitamin B12 deficiency disease 10/28/2008    Qualifier: Diagnosis of  By: Moshe Cipro MD, Joycelyn Schmid      Past Surgical History  Procedure Laterality Date  . Gastric bypass  2004  . Colonoscopy  2008  . Total knee arthroplasty Right 02/23/2015    Procedure: RIGHT TOTAL KNEE ARTHROPLASTY;  Surgeon: Carole Civil, MD;  Location: [facility];  Service: Orthopedics;  Laterality: Right;  . Exam under anesthesia with manipulation of knee Right 05/11/2015    Procedure: EXAM UNDER ANESTHESIA WITH MANIPULATION OF KNEE;  Surgeon: Carole Civil, MD;  Location: [facility];  Service: Orthopedics;  Laterality: Right;    There were no vitals filed for this visit.  Visit Diagnosis:  Decreased range of motion (ROM) of knee  Difficulty walking  Swelling of limb  Right knee pain  Knee stiffness, right      Subjective Assessment - 05/26/15 1012    Subjective "I feel good, I just try to take it easy'   Currently in Pain? No/denies   Pain  Score 0-No pain                         [facility] Adult PT Treatment/Exercise - 05/26/15 0001    Knee/Hip Exercises: Aerobic   Stationary Bike x6 min R hip hike to make full revolutions   Knee/Hip Exercises: Machines for Strengthening   Cybex Knee Extension 5# 2x15  RLE    Cybex Knee Flexion #5  2 x 15   Cybex Leg Press 20# 2x10, x10 no weight to work on stretch   Modalities   Modalities Vasopneumatic   Vasopneumatic   Number Minutes Vasopneumatic  15 minutes   Vasopnuematic Location  Knee   Vasopneumatic Pressure Medium   Vasopneumatic Temperature  34   Manual Therapy   Manual Therapy Soft tissue mobilization;Passive ROM;Joint mobilization   Manual therapy comments PROM taken to end range and hel   Joint Mobilization Patella mobes, Tibio femoral jt mobes    Passive ROM Agressive PROM                  PT Short Term Goals - 05/26/15 1101    PT SHORT TERM GOAL #1   Title Pt will be independent with initial HEP   Status Partially Met           PT Long Term Goals - 05/23/15 1452    PT LONG TERM GOAL #1  Title Pt will increase right knee AROM to 5-110.   Time 6   Period Weeks   Status New   PT LONG TERM GOAL #2   Title Pt will walk 500 feet with minimal deviation.   Time 6   Period Weeks   Status New   PT LONG TERM GOAL #3   Title Pt will report no more than 2/10 pain with active knee flexion.   Time 6   Period Weeks   Status New               Plan - 05/26/15 1059    Clinical Impression Statement Pt stated "I want you to push it until I yell", Pt is guarded and self limiting with MT. Pt often use compensatory techniques to avid pain throughout treatment.   Pt will benefit from skilled therapeutic intervention in order to improve on the following deficits Abnormal gait;Decreased range of motion;Difficulty walking;Pain;Decreased strength   Rehab Potential Good   PT Frequency 3x / week   PT Duration 6 weeks   PT  Treatment/Interventions Cryotherapy;Electrical Stimulation;Moist Heat;Therapeutic exercise;Therapeutic activities;Functional mobility training;Gait training;Ultrasound;Patient/family education;Manual techniques;Vasopneumatic Device;Passive range of motion   PT Next Visit Plan progress strengthening exercises, PROM, TKE        Problem List Patient Active Problem List   Diagnosis Date Noted  . 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 Primary osteoarthritis of right knee   . Knee pain, right 11/29/2014  . Excess skin of arm 11/29/2014  . Osteopenia 08/12/2013  . 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 Prediabetes 08/07/2011  . Vitamin D deficiency 08/07/2011  . Abnormal facial hair 03/29/2011  . PERSONAL HX COLONIC POLYPS 07/05/2010  . KNEE, ARTHRITIS, DEGEN./OSTEO 01/16/2010  . Vitamin B12 deficiency disease 10/28/2008  . 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 Obesity 08/15/2007  . Essential hypertension 08/15/2007    Scot Jun, PTA  05/26/2015, 11:02 AM  [place] [address] [place], [place], [address] Phone: [phone]   Fax:  [phone]  Name: Sheri Jones MRN: [id number] Date of Birth: 10-30-1945

## 2015-05-31 ENCOUNTER — Ambulatory Visit: Payer: Federal, State, Local not specified - PPO | Admitting: Physical Therapy

## 2015-05-31 ENCOUNTER — Encounter: Payer: Self-pay | Admitting: Physical Therapy

## 2015-05-31 DIAGNOSIS — R262 Difficulty in walking, not elsewhere classified: Secondary | ICD-10-CM

## 2015-05-31 DIAGNOSIS — M7989 Other specified soft tissue disorders: Secondary | ICD-10-CM

## 2015-05-31 DIAGNOSIS — M25561 Pain in right knee: Secondary | ICD-10-CM

## 2015-05-31 DIAGNOSIS — M25669 Stiffness of unspecified knee, not elsewhere classified: Secondary | ICD-10-CM

## 2015-05-31 DIAGNOSIS — M25661 Stiffness of right knee, not elsewhere classified: Secondary | ICD-10-CM

## 2015-05-31 DIAGNOSIS — M24669 Ankylosis, unspecified knee: Secondary | ICD-10-CM | POA: Diagnosis not present

## 2015-05-31 NOTE — Therapy (Signed)
[address] [place], [place], [address] Phone: [phone]   Fax:  [phone]  Physical Therapy Treatment  Patient Details  Name: Sheri Jones MRN: [id number] Date of Birth: [birth year] Referring Provider: [name]  Encounter Date: 05/31/2015      PT End of Session - 05/31/15 1346    Visit Number 3   Date for PT Re-Evaluation 07/21/15   PT Start Time 1300   PT Stop Time 1400   PT Time Calculation (min) 60 min   Activity Tolerance Patient tolerated treatment well;Patient limited by pain   Behavior During Therapy [facility] for tasks assessed/performed      Past Medical History  Diagnosis Date  . Allergic rhinitis due to pollen   . Osteoarthrosis, unspecified whether generalized or localized, lower leg   . Obesity, unspecified   . Unspecified essential hypertension   . Anemia   . Diabetes mellitus without complication ([place])   . 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 Vitamin B12 deficiency disease 10/28/2008    Qualifier: Diagnosis of  By: Moshe Cipro MD, Joycelyn Schmid      Past Surgical History  Procedure Laterality Date  . Gastric bypass  2004  . Colonoscopy  2008  . Total knee arthroplasty Right 02/23/2015    Procedure: RIGHT TOTAL KNEE ARTHROPLASTY;  Surgeon: Carole Civil, MD;  Location: [facility];  Service: Orthopedics;  Laterality: Right;  . Exam under anesthesia with manipulation of knee Right 05/11/2015    Procedure: EXAM UNDER ANESTHESIA WITH MANIPULATION OF KNEE;  Surgeon: Carole Civil, MD;  Location: [facility];  Service: Orthopedics;  Laterality: Right;    There were no vitals filed for this visit.  Visit Diagnosis:  Knee stiffness, right  Right knee pain  Swelling of limb  Difficulty walking  Decreased range of motion (ROM) of knee      Subjective Assessment - 05/31/15 1302    Subjective "My sister has been working with me all weekend"   Currently in Pain? No/denies    Pain Score 0-No pain            [facility] PT Assessment - 05/31/15 0001    AROM   Overall AROM Comments RLE 97 deg flex on legpress                     [facility] Adult PT Treatment/Exercise - 05/31/15 0001    Knee/Hip Exercises: Aerobic   Stationary Bike x6 min R hip hike to make full revolutions   Nustep level 4 x 6 minutes   Knee/Hip Exercises: Machines for Strengthening   Cybex Knee Extension 5# 2x15  RLE    Cybex Knee Flexion #10  2x10   Cybex Leg Press 20# 2x10,  multiple bouts no weight to work on stretch   Modalities   Modalities Vasopneumatic   Vasopneumatic   Number Minutes Vasopneumatic  15 minutes   Vasopnuematic Location  Knee   Vasopneumatic Pressure Medium   Vasopneumatic Temperature  34   Manual Therapy   Manual Therapy Soft tissue mobilization;Passive ROM;Joint mobilization   Manual therapy comments PROM taken to end range and held   Joint Mobilization Patella mobes, Tibio femoral jt mobes    Passive ROM Agressive PROM                  PT Short Term Goals - 05/26/15 1101    PT SHORT TERM GOAL #1   Title Pt will  be independent with initial HEP   Status Partially Met           PT Long Term Goals - 05/23/15 1452    PT LONG TERM GOAL #1   Title Pt will increase right knee AROM to 5-110.   Time 6   Period Weeks   Status New   PT LONG TERM GOAL #2   Title Pt will walk 500 feet with minimal deviation.   Time 6   Period Weeks   Status New   PT LONG TERM GOAL #3   Title Pt will report no more than 2/10 pain with active knee flexion.   Time 6   Period Weeks   Status New               Plan - 05/31/15 1347    Clinical Impression Statement Pt appears to be self limiting, continuous use of compensatory techniques with interventions to avoid bending RLE. Pt with reports that she has little pain but with intense grimisses.     Pt will benefit from skilled therapeutic intervention in order to improve on the following deficits  Abnormal gait;Decreased range of motion;Difficulty walking;Pain;Decreased strength   Rehab Potential Good   PT Frequency 3x / week   PT Duration 6 weeks   PT Treatment/Interventions Cryotherapy;Electrical Stimulation;Moist Heat;Therapeutic exercise;Therapeutic activities;Functional mobility training;Gait training;Ultrasound;Patient/family education;Manual techniques;Vasopneumatic Device;Passive range of motion   PT Next Visit Plan ROM  and strength        Problem List Patient Active Problem List   Diagnosis Date Noted  . 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 Primary osteoarthritis of right knee   . Knee pain, right 11/29/2014  . Excess skin of arm 11/29/2014  . Osteopenia 08/12/2013  . 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 Prediabetes 08/07/2011  . Vitamin D deficiency 08/07/2011  . Abnormal facial hair 03/29/2011  . PERSONAL HX COLONIC POLYPS 07/05/2010  . KNEE, ARTHRITIS, DEGEN./OSTEO 01/16/2010  . Vitamin B12 deficiency disease 10/28/2008  . 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 Obesity 08/15/2007  . Essential hypertension 08/15/2007    Scot Jun, PTA  05/31/2015, 1:54 PM  [place] [address] [place], [place], [address] Phone: [phone]   Fax:  [phone]  Name: OTIS BURRESS MRN: [id number] Date of Birth: 06-28-1945

## 2015-06-01 ENCOUNTER — Other Ambulatory Visit: Payer: Self-pay | Admitting: Orthopedic Surgery

## 2015-06-01 ENCOUNTER — Other Ambulatory Visit: Payer: Self-pay | Admitting: *Deleted

## 2015-06-01 ENCOUNTER — Telehealth: Payer: Self-pay | Admitting: Orthopedic Surgery

## 2015-06-01 MED ORDER — OXYCODONE-ACETAMINOPHEN 7.5-325 MG PO TABS: 1.0000 | ORAL_TABLET | ORAL | Status: DC | PRN

## 2015-06-01 MED ORDER — OXYCODONE-ACETAMINOPHEN 5-325 MG PO TABS: 1.0000 | ORAL_TABLET | Freq: Four times a day (QID) | ORAL | Status: DC | PRN

## 2015-06-01 NOTE — Telephone Encounter (Signed)
Patient requests a refill on Oxycodone/Acetaminophen 7.5-325 mgs.

## 2015-06-01 NOTE — Telephone Encounter (Signed)
Prescription available, patient aware  

## 2015-06-02 ENCOUNTER — Encounter: Payer: Self-pay | Admitting: Physical Therapy

## 2015-06-02 ENCOUNTER — Ambulatory Visit: Payer: Federal, State, Local not specified - PPO | Admitting: Physical Therapy

## 2015-06-02 DIAGNOSIS — M7989 Other specified soft tissue disorders: Secondary | ICD-10-CM

## 2015-06-02 DIAGNOSIS — M25661 Stiffness of right knee, not elsewhere classified: Secondary | ICD-10-CM

## 2015-06-02 DIAGNOSIS — M24669 Ankylosis, unspecified knee: Secondary | ICD-10-CM | POA: Diagnosis not present

## 2015-06-02 DIAGNOSIS — M25561 Pain in right knee: Secondary | ICD-10-CM

## 2015-06-02 DIAGNOSIS — M25669 Stiffness of unspecified knee, not elsewhere classified: Secondary | ICD-10-CM

## 2015-06-02 NOTE — Therapy (Signed)
[address] [place], [place], [address] Phone: [phone]   Fax:  [phone]  Physical Therapy Treatment  Patient Details  Name: Sheri Jones MRN: [id number] Date of Birth: April 29, 1945 Referring Provider: [name]  Encounter Date: 06/02/2015      PT End of Session - 06/02/15 1144    Visit Number 4   Date for PT Re-Evaluation 07/21/15   PT Start Time 1106   PT Stop Time 1159   PT Time Calculation (min) 53 min   Activity Tolerance Patient tolerated treatment well;Patient limited by pain      Past Medical History  Diagnosis Date  . Allergic rhinitis due to pollen   . Osteoarthrosis, unspecified whether generalized or localized, lower leg   . Obesity, unspecified   . Unspecified essential hypertension   . Anemia   . Diabetes mellitus without complication ([place])   . 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 Vitamin B12 deficiency disease 10/28/2008    Qualifier: Diagnosis of  By: Moshe Cipro MD, Joycelyn Schmid      Past Surgical History  Procedure Laterality Date  . Gastric bypass  2004  . Colonoscopy  2008  . Total knee arthroplasty Right 02/23/2015    Procedure: RIGHT TOTAL KNEE ARTHROPLASTY;  Surgeon: Carole Civil, MD;  Location: [facility];  Service: Orthopedics;  Laterality: Right;  . Exam under anesthesia with manipulation of knee Right 05/11/2015    Procedure: EXAM UNDER ANESTHESIA WITH MANIPULATION OF KNEE;  Surgeon: Carole Civil, MD;  Location: [facility];  Service: Orthopedics;  Laterality: Right;    There were no vitals filed for this visit.  Visit Diagnosis:  Decreased range of motion (ROM) of knee  Swelling of limb  Right knee pain  Knee stiffness, right      Subjective Assessment - 06/02/15 1107    Subjective "I wasn't going to some, I went walking yesterday my thighs are hurting"   Currently in Pain? Yes   Pain Score 4   Thighs   Pain Orientation Left;Right                          [facility] Adult PT Treatment/Exercise - 06/02/15 0001    Ambulation/Gait   Gait Comments Negotiated 3 flights of stairs one and two rails cues provided to establish alternating pattern.   Knee/Hip Exercises: Aerobic   Stationary Bike x6 min R hip hike to make full revolutions   Nustep L3 x4 min   LE only    Knee/Hip Exercises: Machines for Strengthening   Cybex Knee Extension 5# 3x10  RLE   Cybex Knee Flexion #10  3x10  RLE   Cybex Leg Press 20# 2x15,  multiple bouts no weight to work on stretch   Knee/Hip Exercises: Seated   Sit to [employer] 3 sets;5 reps;with UE support   Modalities   Modalities Vasopneumatic   Vasopneumatic   Number Minutes Vasopneumatic  15 minutes   Vasopnuematic Location  Knee   Vasopneumatic Pressure Medium   Vasopneumatic Temperature  34                  PT Short Term Goals - 05/26/15 1101    PT SHORT TERM GOAL #1   Title Pt will be independent with initial HEP   Status Partially Met           PT Long Term Goals - 05/23/15 1452  PT LONG TERM GOAL #1   Title Pt will increase right knee AROM to 5-110.   Time 6   Period Weeks   Status New   PT LONG TERM GOAL #2   Title Pt will walk 500 feet with minimal deviation.   Time 6   Period Weeks   Status New   PT LONG TERM GOAL #3   Title Pt will report no more than 2/10 pain with active knee flexion.   Time 6   Period Weeks   Status New               Plan - 06/02/15 1145    Clinical Impression Statement Pt remains self limiting, progressed to stair negotiation pt nervous when controlling the ascends and descends with RLE. Unable to properly control sit to stand interventions., pt tends to plop down and  use momentum to accent despite cues.   Pt will benefit from skilled therapeutic intervention in order to improve on the following deficits Abnormal gait;Decreased range of motion;Difficulty walking;Pain;Decreased strength   Rehab Potential Good    PT Frequency 3x / week   PT Duration 6 weeks   PT Treatment/Interventions Cryotherapy;Electrical Stimulation;Moist Heat;Therapeutic exercise;Therapeutic activities;Functional mobility training;Gait training;Ultrasound;Patient/family education;Manual techniques;Vasopneumatic Device;Passive range of motion   PT Next Visit Plan ROM  and strength        Problem List Patient Active Problem List   Diagnosis Date Noted  . 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 Primary osteoarthritis of right knee   . Knee pain, right 11/29/2014  . Excess skin of arm 11/29/2014  . Osteopenia 08/12/2013  . 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 Prediabetes 08/07/2011  . Vitamin D deficiency 08/07/2011  . Abnormal facial hair 03/29/2011  . PERSONAL HX COLONIC POLYPS 07/05/2010  . KNEE, ARTHRITIS, DEGEN./OSTEO 01/16/2010  . Vitamin B12 deficiency disease 10/28/2008  . 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 Obesity 08/15/2007  . Essential hypertension 08/15/2007    Scot Jun, PTA  06/02/2015, 11:48 AM  [place] [address] [place], [place], [address] Phone: [phone]   Fax:  [phone]  Name: Sheri Jones MRN: [id number] Date of Birth: 1946-04-21

## 2015-06-06 ENCOUNTER — Ambulatory Visit (HOSPITAL_COMMUNITY): Payer: Federal, State, Local not specified - PPO | Admitting: Oncology

## 2015-06-06 ENCOUNTER — Encounter (HOSPITAL_COMMUNITY): Payer: Self-pay | Admitting: Oncology

## 2015-06-06 ENCOUNTER — Encounter (HOSPITAL_BASED_OUTPATIENT_CLINIC_OR_DEPARTMENT_OTHER): Payer: Federal, State, Local not specified - PPO | Admitting: Oncology

## 2015-06-06 ENCOUNTER — Encounter (HOSPITAL_COMMUNITY): Payer: Federal, State, Local not specified - PPO | Attending: Oncology

## 2015-06-06 VITALS — BP 134/74 | HR 86 | Temp 97.9°F | Resp 18 | Wt 174.0 lb

## 2015-06-06 DIAGNOSIS — E538 Deficiency of other specified B group vitamins: Secondary | ICD-10-CM

## 2015-06-06 DIAGNOSIS — Z9884 Bariatric surgery status: Secondary | ICD-10-CM | POA: Diagnosis not present

## 2015-06-06 LAB — IRON AND TIBC
IRON: 57 ug/dL (ref 28–170)
SATURATION RATIOS: 18 % (ref 10.4–31.8)
TIBC: 325 ug/dL (ref 250–450)
UIBC: 268 ug/dL

## 2015-06-06 LAB — CBC WITH DIFFERENTIAL/PLATELET
BASOS ABS: 0 10*3/uL (ref 0.0–0.1)
BASOS PCT: 0 %
EOS PCT: 8 %
Eosinophils Absolute: 0.4 10*3/uL (ref 0.0–0.7)
HCT: 35.9 % — ABNORMAL LOW (ref 36.0–46.0)
Hemoglobin: 12.4 g/dL (ref 12.0–15.0)
LYMPHS PCT: 33 %
Lymphs Abs: 1.7 10*3/uL (ref 0.7–4.0)
MCH: 30.1 pg (ref 26.0–34.0)
MCHC: 34.5 g/dL (ref 30.0–36.0)
MCV: 87.1 fL (ref 78.0–100.0)
Monocytes Absolute: 0.4 10*3/uL (ref 0.1–1.0)
Monocytes Relative: 7 %
NEUTROS ABS: 2.6 10*3/uL (ref 1.7–7.7)
Neutrophils Relative %: 52 %
PLATELETS: 224 10*3/uL (ref 150–400)
RBC: 4.12 MIL/uL (ref 3.87–5.11)
RDW: 15.9 % — ABNORMAL HIGH (ref 11.5–15.5)
WBC: 5.1 10*3/uL (ref 4.0–10.5)

## 2015-06-06 LAB — FERRITIN: Ferritin: 32 ng/mL (ref 11–307)

## 2015-06-06 LAB — VITAMIN B12: Vitamin B-12: 1057 pg/mL — ABNORMAL HIGH (ref 180–914)

## 2015-06-06 LAB — FOLATE

## 2015-06-06 NOTE — Patient Instructions (Signed)
[place] at [facility] Discharge Instructions  RECOMMENDATIONS MADE BY THE CONSULTANT AND ANY TEST RESULTS WILL BE SENT TO YOUR REFERRING PHYSICIAN.  Exam and discussion with Kirby Crigler, PA. Lab work and office visit in 12 months. B12 injections as scheduled.   Thank you for choosing [facility] at [facility] to provide your oncology and hematology care.  To afford each patient quality time with our provider, please arrive at least 15 minutes before your scheduled appointment time.   Beginning January 23rd 2017 lab work for the [facility] will be done in the  Main lab at [facility] on 1st floor. If you have a lab appointment with the [facility] please come in thru the  Main Entrance and check in at the main information desk  You need to re-schedule your appointment should you arrive 10 or more minutes late.  We strive to give you quality time with our providers, and arriving late affects you and other patients whose appointments are after yours.  Also, if you no show three or more times for appointments you may be dismissed from the clinic at the providers discretion.     Again, thank you for choosing [facility].  Our hope is that these requests will decrease the amount of time that you wait before being seen by our physicians.       _____________________________________________________________  Should you have questions after your visit to [facility], please contact our office at [phone] between the hours of 8:30 a.m. and 4:30 p.m.  Voicemails left after 4:30 p.m. will not be returned until the following business day.  For prescription refill requests, have your pharmacy contact our office.

## 2015-06-06 NOTE — Progress Notes (Signed)
Sheri Nakayama, MD [address] [place] [address]  Vitamin B12 deficiency disease - Plan: CBC with Differential, Vitamin B12  Gastric bypass status for obesity - Plan: CBC with Differential, Folate, Iron and TIBC, Ferritin, Copper, serum  CURRENT THERAPY: Vitamin B 12 injections 1000 mcg every 4 weeks.   INTERVAL HISTORY: Sheri Jones 70 y.o. 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   I personally reviewed and went over laboratory results with the patient.  The results are noted within this dictation.  She is S/P right knee total arthroplasty by Dr. Aline Brochure on 05/11/2015.  She is currently involved with PT for recovery.  I personally reviewed and went over radiographic studies with the patient.  The results are noted within this dictation.  Her mammogram on 12/02/2014 was BIRADS 1.  She will be due this coming August for her next annual mammogram.  Hematologically, she denies any complaints and ROS questioning is negative.    Past Medical History  Diagnosis Date  . Allergic rhinitis due to pollen   . Osteoarthrosis, unspecified whether generalized or localized, lower leg   . Obesity, unspecified   . Unspecified essential hypertension   . Anemia   . Diabetes mellitus without complication ([place])   . 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     is allergic to eggs or egg-derived products and milk-related compounds.  [name] does not currently have medications on file.  Past Surgical History  Procedure Laterality Date  . Gastric bypass  2004  . Colonoscopy  2008  . Total knee arthroplasty Right 02/23/2015    Procedure: RIGHT TOTAL KNEE ARTHROPLASTY;  Surgeon: Carole Civil, MD;  Location: [facility];  Service: Orthopedics;  Laterality: Right;  . Exam under anesthesia with manipulation of knee Right 05/11/2015    Procedure: EXAM UNDER ANESTHESIA WITH MANIPULATION OF KNEE;  Surgeon: Carole Civil, MD;  Location: [facility];  Service: Orthopedics;  Laterality: Right;    Denies any headaches, dizziness, double vision, fevers, chills, night sweats, nausea, vomiting, diarrhea, constipation, chest pain, heart palpitations, shortness of breath, blood in stool, black tarry stool, urinary pain, urinary burning, urinary frequency, hematuria.   PHYSICAL EXAMINATION  ECOG PERFORMANCE STATUS: 0 - Asymptomatic  Filed Vitals:   06/06/15 1325  BP: 134/74  Pulse: 86  Temp: 97.9 F (36.6 C)  Resp: 18    GENERAL:alert, no distress, well nourished, well developed, comfortable, cooperative, and smiling SKIN: skin color, texture, turgor are normal, no rashes or significant lesions HEAD: Normocephalic, No masses, lesions, tenderness or abnormalities EYES: normal, PERRLA, EOMI, Conjunctiva are pink and non-injected EARS: External ears normal OROPHARYNX:lips, buccal mucosa, and tongue normal and mucous membranes are moist  NECK: supple, thyroid normal size, non-tender, without nodularity, no stridor, non-tender, trachea midline LYMPH:  no palpable lymphadenopathy BREAST:not examined LUNGS: clear to auscultation and  percussion HEART: regular rate & rhythm, no murmurs, no gallops, S1 normal and S2 normal ABDOMEN:abdomen soft, non-tender, obese, normal bowel sounds, no  masses or organomegaly and no hepatosplenomegaly BACK: Back symmetric, no curvature., No CVA tenderness EXTREMITIES:less then 2 second capillary refill, no joint deformities, effusion, or inflammation, no edema, no skin discoloration, no clubbing, no cyanosis, right knee with surgical scar that is well healed NEURO: alert & oriented x 3 with fluent speech, no focal motor/sensory deficits, gait normal   LABORATORY DATA: CBC    Component Value Date/Time   WBC 5.1 06/06/2015 1302   RBC 4.12 06/06/2015 1302   HGB 12.4 06/06/2015 1302   HCT 35.9* 06/06/2015 1302   PLT 224 06/06/2015 1302   MCV 87.1 06/06/2015 1302   MCH 30.1 06/06/2015 1302   MCHC 34.5 06/06/2015 1302   RDW 15.9* 06/06/2015 1302   LYMPHSABS 1.7 06/06/2015 1302   MONOABS 0.4 06/06/2015 1302   EOSABS 0.4 06/06/2015 1302   BASOSABS 0.0 06/06/2015 1302      Chemistry      Component Value Date/Time   NA 141 05/12/2015 0628   K 3.8 05/12/2015 0628   CL 107 05/12/2015 0628   CO2 28 05/12/2015 0628   BUN 15 05/12/2015 0628   CREATININE 0.86 05/12/2015 0628   CREATININE 0.85 07/22/2014 0911      Component Value Date/Time   CALCIUM 8.3* 05/12/2015 0628   ALKPHOS 60 07/22/2014 0911   AST 24 07/22/2014 0911   ALT 22 07/22/2014 0911   BILITOT 0.6 07/22/2014 0911     Lab Results  Component Value Date   IRON 73 06/04/2014   TIBC 279 06/04/2014   FERRITIN 62 06/04/2014   Lab Results  Component Value Date   VITAMINB12 1075* 06/04/2014   Lab Results  Component Value Date   FOLATE >20.0 06/04/2014     RADIOGRAPHIC STUDIES:  11/23/2013  CLINICAL DATA: Screening.  EXAM: DIGITAL SCREENING BILATERAL MAMMOGRAM WITH CAD  COMPARISON: Previous exam(s)  ACR Breast Density Category a: The breast tissue is almost entirely fatty.  FINDINGS: There are no findings suspicious for malignancy. Images were processed with CAD.  IMPRESSION: No mammographic evidence of malignancy. A result letter of  this screening mammogram will be mailed directly to the patient.  RECOMMENDATION: Screening mammogram in one year. (Code:SM-B-01Y)  BI-RADS CATEGORY 1: Negative.   Electronically Signed  By: Lajean Manes M.D.  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    Continue Vit B12 injections monthly.    Return in 12 months for follow-up.  Gastric bypass status for obesity Gastric bypass surgery January 2004.    Labs today and in 12 months: CBC diff, Vit B12, iron/TIBC, ferritin, folate, and serum copper.     THERAPY PLAN:  We will continue to monitor blood counts and vitamin levels.  Continue monthly B12 injections.  Return in 12 months for annual follow-up and lab work.  All questions were answered. The patient knows to call the clinic with any problems, questions or concerns. We can certainly see the patient much sooner if necessary.  Patient and plan discussed with Dr. Ancil Linsey and she is in agreement with the aforementioned.   This note is electronically signed by: Robynn Pane 06/06/2015 2:23 PM

## 2015-06-06 NOTE — Assessment & Plan Note (Signed)
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  Continue Vit B12 injections monthly.  Return in 12 months for follow-up. 

## 2015-06-06 NOTE — Assessment & Plan Note (Signed)
Gastric bypass surgery January 2004.    Labs today and in 12 months: CBC diff, Vit B12, iron/TIBC, ferritin, folate, and serum copper.    

## 2015-06-07 ENCOUNTER — Other Ambulatory Visit (HOSPITAL_COMMUNITY): Payer: Self-pay | Admitting: Oncology

## 2015-06-07 ENCOUNTER — Ambulatory Visit: Payer: Federal, State, Local not specified - PPO | Admitting: Physical Therapy

## 2015-06-07 ENCOUNTER — Encounter: Payer: Self-pay | Admitting: Physical Therapy

## 2015-06-07 DIAGNOSIS — M24669 Ankylosis, unspecified knee: Secondary | ICD-10-CM | POA: Diagnosis not present

## 2015-06-07 DIAGNOSIS — M25561 Pain in right knee: Secondary | ICD-10-CM

## 2015-06-07 DIAGNOSIS — M7989 Other specified soft tissue disorders: Secondary | ICD-10-CM

## 2015-06-07 DIAGNOSIS — R262 Difficulty in walking, not elsewhere classified: Secondary | ICD-10-CM

## 2015-06-07 DIAGNOSIS — M25669 Stiffness of unspecified knee, not elsewhere classified: Secondary | ICD-10-CM

## 2015-06-07 DIAGNOSIS — Z9884 Bariatric surgery status: Secondary | ICD-10-CM

## 2015-06-07 LAB — COPPER, SERUM: COPPER: 147 ug/dL (ref 72–166)

## 2015-06-07 NOTE — Therapy (Signed)
[place], [place], [address] Phone: [phone]   Fax:  [phone]  Physical Therapy Treatment  Patient Details  Name: Sheri Jones MRN: [id number] Date of Birth: Oct 27, 1945 Referring Provider: [name]  Encounter Date: 06/07/2015      PT End of Session - 06/07/15 1341    Visit Number 5   Date for PT Re-Evaluation 07/21/15   PT Start Time 1300   PT Stop Time 1354   PT Time Calculation (min) 54 min   Activity Tolerance Patient tolerated treatment well;Patient limited by pain   Behavior During Therapy [facility] for tasks assessed/performed      Past Medical History  Diagnosis Date  . Allergic rhinitis due to pollen   . Osteoarthrosis, unspecified whether generalized or localized, lower leg   . Obesity, unspecified   . Unspecified essential hypertension   . Anemia   . Diabetes mellitus without complication ([place])   . 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 Vitamin B12 deficiency disease 10/28/2008    Qualifier: Diagnosis of  By: Moshe Cipro MD, Joycelyn Schmid      Past Surgical History  Procedure Laterality Date  . Gastric bypass  2004  . Colonoscopy  2008  . Total knee arthroplasty Right 02/23/2015    Procedure: RIGHT TOTAL KNEE ARTHROPLASTY;  Surgeon: Carole Civil, MD;  Location: [facility];  Service: Orthopedics;  Laterality: Right;  . Exam under anesthesia with manipulation of knee Right 05/11/2015    Procedure: EXAM UNDER ANESTHESIA WITH MANIPULATION OF KNEE;  Surgeon: Carole Civil, MD;  Location: [facility];  Service: Orthopedics;  Laterality: Right;    There were no vitals filed for this visit.  Visit Diagnosis:  Decreased range of motion (ROM) of knee  Swelling of limb  Right knee pain  Difficulty walking      Subjective Assessment - 06/07/15 1301    Subjective "Im doing good, I had to go to the doctor yesterday my iron is low"   Currently in Pain? Yes   Pain  Score 4    Pain Location Knee   Pain Orientation Right   Pain Descriptors / Indicators --  sting            [facility] PT Assessment - 06/07/15 0001    AROM   Overall AROM Comments 90 seated                      [facility] Adult PT Treatment/Exercise - 06/07/15 0001    Knee/Hip Exercises: Aerobic   Stationary Bike 4 min R hip hike to make full revolutions   Nustep L4 x6 min    Knee/Hip Exercises: Machines for Strengthening   Cybex Leg Press 30# 3x10,  multiple bouts no weight to work on stretch   Knee/Hip Exercises: Standing   Lateral Step Up 2 sets;10 reps;Hand Hold: 0;Step Height: 6"   Forward Step Up 2 sets;10 reps;5 reps;Step Height: 6";Step Height: 8"   Modalities   Modalities Vasopneumatic   Vasopneumatic   Number Minutes Vasopneumatic  15 minutes   Vasopnuematic Location  Knee   Vasopneumatic Pressure Medium   Vasopneumatic Temperature  34   Manual Therapy   Manual Therapy Passive ROM;Joint mobilization   Manual therapy comments PROM taken to end range and held   Joint Mobilization Patella mobes, belt jt mobs   Passive ROM Agressive PROM  PT Short Term Goals - 05/26/15 1101    PT SHORT TERM GOAL #1   Title Pt will be independent with initial HEP   Status Partially Met           PT Long Term Goals - 05/23/15 1452    PT LONG TERM GOAL #1   Title Pt will increase right knee AROM to 5-110.   Time 6   Period Weeks   Status New   PT LONG TERM GOAL #2   Title Pt will walk 500 feet with minimal deviation.   Time 6   Period Weeks   Status New   PT LONG TERM GOAL #3   Title Pt will report no more than 2/10 pain with active knee flexion.   Time 6   Period Weeks   Status New               Plan - 06/07/15 1341    Clinical Impression Statement No increase in AROM, pt remains guarded with MT. Progressed with more functional interventions but does demo RLE weakness.   Pt will benefit from skilled therapeutic  intervention in order to improve on the following deficits Abnormal gait;Decreased range of motion;Difficulty walking;Pain;Decreased strength   Rehab Potential Good   PT Frequency 3x / week   PT Duration 6 weeks   PT Treatment/Interventions Cryotherapy;Electrical Stimulation;Moist Heat;Therapeutic exercise;Therapeutic activities;Functional mobility training;Gait training;Ultrasound;Patient/family education;Manual techniques;Vasopneumatic Device;Passive range of motion   PT Next Visit Plan functional strengthening        Problem List Patient Active Problem List   Diagnosis Date Noted  . 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 Primary osteoarthritis of right knee   . Knee pain, right 11/29/2014  . Excess skin of arm 11/29/2014  . Osteopenia 08/12/2013  . 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 Prediabetes 08/07/2011  . Vitamin D deficiency 08/07/2011  . Abnormal facial hair 03/29/2011  . PERSONAL HX COLONIC POLYPS 07/05/2010  . KNEE, ARTHRITIS, DEGEN./OSTEO 01/16/2010  . Vitamin B12 deficiency disease 10/28/2008  . 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 Obesity 08/15/2007  . Essential hypertension 08/15/2007    Scot Jun, PTA  06/07/2015, 1:43 PM  [place], [place], [address] Phone: [phone]   Fax:  [phone]  Name: Sheri Jones MRN: [id number] Date of Birth: [birth year]

## 2015-06-09 ENCOUNTER — Ambulatory Visit: Payer: Federal, State, Local not specified - PPO | Admitting: Physical Therapy

## 2015-06-09 ENCOUNTER — Encounter: Payer: Self-pay | Admitting: Physical Therapy

## 2015-06-09 DIAGNOSIS — M25669 Stiffness of unspecified knee, not elsewhere classified: Secondary | ICD-10-CM

## 2015-06-09 DIAGNOSIS — M25561 Pain in right knee: Secondary | ICD-10-CM

## 2015-06-09 DIAGNOSIS — M7989 Other specified soft tissue disorders: Secondary | ICD-10-CM

## 2015-06-09 DIAGNOSIS — M25661 Stiffness of right knee, not elsewhere classified: Secondary | ICD-10-CM

## 2015-06-09 DIAGNOSIS — R262 Difficulty in walking, not elsewhere classified: Secondary | ICD-10-CM

## 2015-06-09 DIAGNOSIS — M24669 Ankylosis, unspecified knee: Secondary | ICD-10-CM | POA: Diagnosis not present

## 2015-06-09 NOTE — Therapy (Signed)
[place], [place], [address] Phone: [phone]   Fax:  [phone]  Physical Therapy Treatment  Patient Details  Name: Sheri Jones MRN: [id number] Date of Birth: 11/19/1945 Referring Provider: [name]  Encounter Date: 06/09/2015      PT End of Session - 06/09/15 1142    Visit Number 6   Date for PT Re-Evaluation 07/21/15   PT Start Time 1103   PT Stop Time 1157   PT Time Calculation (min) 54 min   Activity Tolerance Patient tolerated treatment well;Patient limited by pain   Behavior During Therapy [facility] for tasks assessed/performed      Past Medical History  Diagnosis Date  . Allergic rhinitis due to pollen   . Osteoarthrosis, unspecified whether generalized or localized, lower leg   . Obesity, unspecified   . Unspecified essential hypertension   . Anemia   . Diabetes mellitus without complication ([place])   . 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 Vitamin B12 deficiency disease 10/28/2008    Qualifier: Diagnosis of  By: Moshe Cipro MD, Joycelyn Schmid      Past Surgical History  Procedure Laterality Date  . Gastric bypass  2004  . Colonoscopy  2008  . Total knee arthroplasty Right 02/23/2015    Procedure: RIGHT TOTAL KNEE ARTHROPLASTY;  Surgeon: Carole Civil, MD;  Location: [facility];  Service: Orthopedics;  Laterality: Right;  . Exam under anesthesia with manipulation of knee Right 05/11/2015    Procedure: EXAM UNDER ANESTHESIA WITH MANIPULATION OF KNEE;  Surgeon: Carole Civil, MD;  Location: [facility];  Service: Orthopedics;  Laterality: Right;    There were no vitals filed for this visit.  Visit Diagnosis:  Knee stiffness, right  Difficulty walking  Right knee pain  Swelling of limb  Decreased range of motion (ROM) of knee      Subjective Assessment - 06/09/15 1106    Subjective "Im tired today"   Currently in Pain? No/denies   Pain Score 0-No pain                          [facility] Adult PT Treatment/Exercise - 06/09/15 0001    Knee/Hip Exercises: Aerobic   Stationary Bike 4 min R hip hike to make full revolutions   Nustep L4 x6 min    Knee/Hip Exercises: Machines for Strengthening   Cybex Knee Extension 5# 2x10 RLE    Cybex Knee Flexion #10  2x15 RLE    Cybex Leg Press 30# 3x10,  multiple bouts no weight to work on stretch   Knee/Hip Exercises: Standing   Lateral Step Up 2 sets;10 reps;Hand Hold: 0;Step Height: 4"   Forward Step Up 2 sets;10 reps;Right;Hand Hold: 0;Step Height: 6"   Modalities   Modalities Vasopneumatic   Vasopneumatic   Number Minutes Vasopneumatic  15 minutes   Vasopnuematic Location  Knee   Vasopneumatic Pressure Medium   Vasopneumatic Temperature  34                  PT Short Term Goals - 05/26/15 1101    PT SHORT TERM GOAL #1   Title Pt will be independent with initial HEP   Status Partially Met           PT Long Term Goals - 05/23/15 1452    PT LONG TERM GOAL #1   Title Pt will increase right knee AROM to 5-110.  Time 6   Period Weeks   Status New   PT LONG TERM GOAL #2   Title Pt will walk 500 feet with minimal deviation.   Time 6   Period Weeks   Status New   PT LONG TERM GOAL #3   Title Pt will report no more than 2/10 pain with active knee flexion.   Time 6   Period Weeks   Status New               Plan - 06/09/15 1143    Clinical Impression Statement Pt enters clinic reporting that she was tired today. Pt expressed confidence on leg press this date with RLE ROM. R knee ROM still very limited.  Pt able to perform step up interventions without UE assist..   Pt will benefit from skilled therapeutic intervention in order to improve on the following deficits Abnormal gait;Decreased range of motion;Difficulty walking;Pain;Decreased strength   Rehab Potential Good   PT Frequency 3x / week   PT Duration 6 weeks   PT Treatment/Interventions  Cryotherapy;Electrical Stimulation;Moist Heat;Therapeutic exercise;Therapeutic activities;Functional mobility training;Gait training;Ultrasound;Patient/family education;Manual techniques;Vasopneumatic Device;Passive range of motion   PT Next Visit Plan functional strengthening        Problem List Patient Active Problem List   Diagnosis Date Noted  . 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 Primary osteoarthritis of right knee   . Knee pain, right 11/29/2014  . Excess skin of arm 11/29/2014  . Osteopenia 08/12/2013  . 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 Prediabetes 08/07/2011  . Vitamin D deficiency 08/07/2011  . Abnormal facial hair 03/29/2011  . PERSONAL HX COLONIC POLYPS 07/05/2010  . KNEE, ARTHRITIS, DEGEN./OSTEO 01/16/2010  . Vitamin B12 deficiency disease 10/28/2008  . 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 Obesity 08/15/2007  . Essential hypertension 08/15/2007    Scot Jun, PTA  06/09/2015, 11:46 AM  [place], [place], [address] Phone: [phone]   Fax:  [phone]  Name: Sheri Jones MRN: [id number] Date of Birth: 1946-02-04

## 2015-06-14 ENCOUNTER — Ambulatory Visit: Payer: Federal, State, Local not specified - PPO | Admitting: Physical Therapy

## 2015-06-14 ENCOUNTER — Encounter: Payer: Self-pay | Admitting: Physical Therapy

## 2015-06-14 DIAGNOSIS — M25669 Stiffness of unspecified knee, not elsewhere classified: Secondary | ICD-10-CM

## 2015-06-14 DIAGNOSIS — R262 Difficulty in walking, not elsewhere classified: Secondary | ICD-10-CM

## 2015-06-14 DIAGNOSIS — M25661 Stiffness of right knee, not elsewhere classified: Secondary | ICD-10-CM

## 2015-06-14 DIAGNOSIS — M24669 Ankylosis, unspecified knee: Secondary | ICD-10-CM | POA: Diagnosis not present

## 2015-06-14 DIAGNOSIS — M25561 Pain in right knee: Secondary | ICD-10-CM

## 2015-06-14 DIAGNOSIS — M7989 Other specified soft tissue disorders: Secondary | ICD-10-CM

## 2015-06-14 NOTE — Therapy (Signed)
[place] [address] [place], [place], [address] Phone: [phone]   Fax:  [phone]  Physical Therapy Treatment  Patient Details  Name: Sheri Jones MRN: [id number] Date of Birth: 05-25-1945 Referring Provider: [name]  Encounter Date: 06/14/2015      PT End of Session - 06/14/15 1342    Visit Number 7   Date for PT Re-Evaluation 07/21/15   PT Start Time 1301   PT Stop Time 1342   PT Time Calculation (min) 41 min   Activity Tolerance Patient tolerated treatment well;Patient limited by pain   Behavior During Therapy [facility] for tasks assessed/performed      Past Medical History  Diagnosis Date  . Allergic rhinitis due to pollen   . Osteoarthrosis, unspecified whether generalized or localized, lower leg   . Obesity, unspecified   . Unspecified essential hypertension   . Anemia   . Diabetes mellitus without complication ([place])   . 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 Vitamin B12 deficiency disease 10/28/2008    Qualifier: Diagnosis of  By: Moshe Cipro MD, Joycelyn Schmid      Past Surgical History  Procedure Laterality Date  . Gastric bypass  2004  . Colonoscopy  2008  . Total knee arthroplasty Right 02/23/2015    Procedure: RIGHT TOTAL KNEE ARTHROPLASTY;  Surgeon: Carole Civil, MD;  Location: [facility];  Service: Orthopedics;  Laterality: Right;  . Exam under anesthesia with manipulation of knee Right 05/11/2015    Procedure: EXAM UNDER ANESTHESIA WITH MANIPULATION OF KNEE;  Surgeon: Carole Civil, MD;  Location: [facility];  Service: Orthopedics;  Laterality: Right;    There were no vitals filed for this visit.  Visit Diagnosis:  Knee stiffness, right  Right knee pain  Swelling of limb  Difficulty walking  Decreased range of motion (ROM) of knee      Subjective Assessment - 06/14/15 1303    Subjective "Everything had been going ok"   Currently in Pain? Yes   Pain Score 4    Pain Location Knee   Pain Orientation Right                         [facility] Adult PT Treatment/Exercise - 06/14/15 0001    Knee/Hip Exercises: Aerobic   Stationary Bike 5 min R hip hike to make full revolutions   Nustep L4 x6 min    Knee/Hip Exercises: Machines for Strengthening   Cybex Leg Press 40# 3x15,  multiple bouts no weight to work on stretch   Knee/Hip Exercises: Standing   Lateral Step Up 2 sets;10 reps;Hand Hold: 0;Step Height: 6"   Knee/Hip Exercises: Seated   Long Arc Quad Right;10 reps;3 sets   [employer] Weight 2 lbs.   Manual Therapy   Manual Therapy Passive ROM   Manual therapy comments PROM taken to end range and hel   Passive ROM Agressive PROM                  PT Short Term Goals - 05/26/15 1101    PT SHORT TERM GOAL #1   Title Pt will be independent with initial HEP   Status Partially Met           PT Long Term Goals - 05/23/15 1452    PT LONG TERM GOAL #1   Title Pt will increase right knee AROM to 5-110.   Time 6   Period  Weeks   Status New   PT LONG TERM GOAL #2   Title Pt will walk 500 feet with minimal deviation.   Time 6   Period Weeks   Status New   PT LONG TERM GOAL #3   Title Pt will report no more than 2/10 pain with active knee flexion.   Time 6   Period Weeks   Status New               Plan - 06/14/15 1342    Clinical Impression Statement Continues with limited R knee ROM, demos increase strength with leg press. Pt remain very self limited and guarded with MT   Pt will benefit from skilled therapeutic intervention in order to improve on the following deficits Abnormal gait;Decreased range of motion;Difficulty walking;Pain;Decreased strength   Rehab Potential Good   PT Frequency 3x / week   PT Duration 6 weeks   PT Treatment/Interventions Cryotherapy;Electrical Stimulation;Moist Heat;Therapeutic exercise;Therapeutic activities;Functional mobility training;Gait training;Ultrasound;Patient/family  education;Manual techniques;Vasopneumatic Device;Passive range of motion   PT Next Visit Plan functional strengthening        Problem List Patient Active Problem List   Diagnosis Date Noted  . 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 Primary osteoarthritis of right knee   . Knee pain, right 11/29/2014  . Excess skin of arm 11/29/2014  . Osteopenia 08/12/2013  . 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 Prediabetes 08/07/2011  . Vitamin D deficiency 08/07/2011  . Abnormal facial hair 03/29/2011  . PERSONAL HX COLONIC POLYPS 07/05/2010  . KNEE, ARTHRITIS, DEGEN./OSTEO 01/16/2010  . Vitamin B12 deficiency disease 10/28/2008  . 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 Obesity 08/15/2007  . Essential hypertension 08/15/2007    Scot Jun, PTA  06/14/2015, 1:46 PM  [place] [address] [place], [place], [address] Phone: [phone]   Fax:  [phone]  Name: ULONDA KLOSOWSKI MRN: [id number] Date of Birth: April 30, 1945

## 2015-06-15 ENCOUNTER — Encounter (HOSPITAL_BASED_OUTPATIENT_CLINIC_OR_DEPARTMENT_OTHER): Payer: Federal, State, Local not specified - PPO

## 2015-06-15 VITALS — BP 137/75 | HR 81 | Temp 98.1°F | Resp 18

## 2015-06-15 DIAGNOSIS — D649 Anemia, unspecified: Secondary | ICD-10-CM

## 2015-06-15 DIAGNOSIS — Z9884 Bariatric surgery status: Secondary | ICD-10-CM

## 2015-06-15 MED ORDER — SODIUM CHLORIDE 0.9 % IV SOLN
INTRAVENOUS | Status: DC
  Administered 2015-06-15: 14:00:00 via INTRAVENOUS

## 2015-06-15 MED ORDER — SODIUM CHLORIDE 0.9 % IV SOLN
125.0000 mg | Freq: Once | INTRAVENOUS | Status: AC
  Administered 2015-06-15: 125 mg via INTRAVENOUS
  Filled 2015-06-15: qty 10

## 2015-06-15 NOTE — Patient Instructions (Signed)
[place] at [facility] Discharge Instructions  RECOMMENDATIONS MADE BY THE CONSULTANT AND ANY TEST RESULTS WILL BE SENT TO YOUR REFERRING PHYSICIAN.  IV ferric gluconate today.   Sodium Ferric Gluconate Complex injection What is this medicine? SODIUM FERRIC GLUCONATE COMPLEX (SOE dee um FER ik GLOO koe nate KOM pleks) is an iron replacement. It is used with epoetin therapy to treat low iron levels in patients who are receiving hemodialysis. This medicine may be used for other purposes; ask your health care provider or pharmacist if you have questions. What should I tell my health care provider before I take this medicine? They need to know if you have any of the following conditions: -anemia that is not from iron deficiency -high levels of iron in the body -an unusual or allergic reaction to iron, benzyl alcohol, other medicines, foods, dyes, or preservatives -pregnant or are trying to become pregnant -breast-feeding How should I use this medicine? This medicine is for infusion into a vein. It is given by a health care professional in a hospital or clinic setting. Talk to your pediatrician regarding the use of this medicine in children. While this drug may be prescribed for children as young as 75 years old for selected conditions, precautions do apply. Overdosage: If you think you have taken too much of this medicine contact a poison control center or emergency room at once. NOTE: This medicine is only for you. Do not share this medicine with others. What if I miss a dose? It is important not to miss your dose. Call your doctor or health care professional if you are unable to keep an appointment. What may interact with this medicine? Do not take this medicine with any of the following medications: -deferoxamine -dimercaprol -other iron products This medicine may also interact with the following medications: -chloramphenicol -deferasirox -medicine for blood  pressure like enalapril This list may not describe all possible interactions. Give your health care provider a list of all the medicines, herbs, non-prescription drugs, or dietary supplements you use. Also tell them if you smoke, drink alcohol, or use illegal drugs. Some items may interact with your medicine. What should I watch for while using this medicine? Your condition will be monitored carefully while you are receiving this medicine. Visit your doctor for check-ups as directed. What side effects may I notice from receiving this medicine? Side effects that you should report to your doctor or health care professional as soon as possible: -allergic reactions like skin rash, itching or hives, swelling of the face, lips, or tongue -breathing problems -changes in hearing -changes in vision -chills, flushing, or sweating -fast, irregular heartbeat -feeling faint or lightheaded, falls -fever, flu-like symptoms -high or low blood pressure -pain, tingling, numbness in the hands or feet -severe pain in the chest, back, flanks, or groin -swelling of the ankles, feet, hands -trouble passing urine or change in the amount of urine -unusually weak or tired Side effects that usually do not require medical attention (report to your doctor or health care professional if they continue or are bothersome): -cramps -dark colored stools -diarrhea -headache -nausea, vomiting -stomach upset This list may not describe all possible side effects. Call your doctor for medical advice about side effects. You may report side effects to FDA at 1-800-FDA-1088. Where should I keep my medicine? This drug is given in a hospital or clinic and will not be stored at home. NOTE: This sheet is a summary. It may not cover all possible information.  If you have questions about this medicine, talk to your doctor, pharmacist, or health care provider.    2016, Elsevier/Gold Standard. (2007-12-10 15:58:57)     Thank you  for choosing [place] at [facility] to provide your oncology and hematology care.  To afford each patient quality time with our provider, please arrive at least 15 minutes before your scheduled appointment time.   Beginning January 23rd 2017 lab work for the [facility] will be done in the  Main lab at [facility] on 1st floor. If you have a lab appointment with the [place] please come in thru the  Main Entrance and check in at the main information desk  You need to re-schedule your appointment should you arrive 10 or more minutes late.  We strive to give you quality time with our providers, and arriving late affects you and other patients whose appointments are after yours.  Also, if you no show three or more times for appointments you may be dismissed from the clinic at the providers discretion.     Again, thank you for choosing [facility].  Our hope is that these requests will decrease the amount of time that you wait before being seen by our physicians.       _____________________________________________________________  Should you have questions after your visit to [facility], please contact our office at [phone] between the hours of 8:30 a.m. and 4:30 p.m.  Voicemails left after 4:30 p.m. will not be returned until the following business day.  For prescription refill requests, have your pharmacy contact our office.

## 2015-06-15 NOTE — Progress Notes (Signed)
Patient tolerated infusion well.  VSS.   

## 2015-06-16 ENCOUNTER — Ambulatory Visit: Payer: Federal, State, Local not specified - PPO | Admitting: Physical Therapy

## 2015-06-16 ENCOUNTER — Encounter: Payer: Self-pay | Admitting: Physical Therapy

## 2015-06-16 DIAGNOSIS — R262 Difficulty in walking, not elsewhere classified: Secondary | ICD-10-CM

## 2015-06-16 DIAGNOSIS — M25669 Stiffness of unspecified knee, not elsewhere classified: Secondary | ICD-10-CM

## 2015-06-16 DIAGNOSIS — M25561 Pain in right knee: Secondary | ICD-10-CM

## 2015-06-16 DIAGNOSIS — M25661 Stiffness of right knee, not elsewhere classified: Secondary | ICD-10-CM

## 2015-06-16 DIAGNOSIS — M24669 Ankylosis, unspecified knee: Secondary | ICD-10-CM | POA: Diagnosis not present

## 2015-06-16 DIAGNOSIS — M7989 Other specified soft tissue disorders: Secondary | ICD-10-CM

## 2015-06-16 NOTE — Therapy (Signed)
[place], [place], [address] Phone: [phone]   Fax:  [phone]  Physical Therapy Treatment  Patient Details  Name: Sheri Jones MRN: [id number] Date of Birth: Feb 13, 1946 Referring Provider: [name]  Encounter Date: 06/16/2015      PT End of Session - 06/16/15 1150    Visit Number 8   Date for PT Re-Evaluation 07/21/15   PT Start Time 1104   PT Stop Time 1147   PT Time Calculation (min) 43 min   Activity Tolerance Patient tolerated treatment well;Patient limited by pain   Behavior During Therapy [facility] for tasks assessed/performed      Past Medical History  Diagnosis Date  . Allergic rhinitis due to pollen   . Osteoarthrosis, unspecified whether generalized or localized, lower leg   . Obesity, unspecified   . Unspecified essential hypertension   . Anemia   . Diabetes mellitus without complication ([place])   . 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 Vitamin B12 deficiency disease 10/28/2008    Qualifier: Diagnosis of  By: Moshe Cipro MD, Joycelyn Schmid      Past Surgical History  Procedure Laterality Date  . Gastric bypass  2004  . Colonoscopy  2008  . Total knee arthroplasty Right 02/23/2015    Procedure: RIGHT TOTAL KNEE ARTHROPLASTY;  Surgeon: Carole Civil, MD;  Location: [facility];  Service: Orthopedics;  Laterality: Right;  . Exam under anesthesia with manipulation of knee Right 05/11/2015    Procedure: EXAM UNDER ANESTHESIA WITH MANIPULATION OF KNEE;  Surgeon: Carole Civil, MD;  Location: [facility];  Service: Orthopedics;  Laterality: Right;    There were no vitals filed for this visit.  Visit Diagnosis:  Knee stiffness, right  Right knee pain  Difficulty walking  Decreased range of motion (ROM) of knee  Swelling of limb      Subjective Assessment - 06/16/15 1106    Subjective "Im doing good, I left the house a little late"   Currently in Pain? Yes   Pain Score 4    Pain Location Knee   Pain Orientation Right            [facility] PT Assessment - 06/16/15 0001    AROM   Overall AROM Comments R knee 28-85 deg  flex                     [facility] Adult PT Treatment/Exercise - 06/16/15 0001    Knee/Hip Exercises: Aerobic   Stationary Bike 5 min R hip hike to make full revolutions   Nustep L4 x6 min    Knee/Hip Exercises: Machines for Strengthening   Cybex Knee Extension 5# 2x10 RLE    Cybex Knee Flexion #10  2x15 RLE    Cybex Leg Press 40# 3x10,  multiple bouts no weight to work on stretch   Knee/Hip Exercises: Seated   Long Arc Quad Right;10 reps;3 sets   [employer] Weight 3 lbs.   Other Seated Knee/Hip Exercises 3x10   Marching Weights 3 lbs.   Manual Therapy   Manual Therapy Passive ROM   Manual therapy comments PROM taken to end range and held, Pt very guarded.   Passive ROM PROM                  PT Short Term Goals - 05/26/15 1101    PT SHORT TERM GOAL #1   Title Pt will be independent with  initial HEP   Status Partially Met           PT Long Term Goals - 05/23/15 1452    PT LONG TERM GOAL #1   Title Pt will increase right knee AROM to 5-110.   Time 6   Period Weeks   Status New   PT LONG TERM GOAL #2   Title Pt will walk 500 feet with minimal deviation.   Time 6   Period Weeks   Status New   PT LONG TERM GOAL #3   Title Pt will report no more than 2/10 pain with active knee flexion.   Time 6   Period Weeks   Status New               Plan - 06/16/15 1151    Clinical Impression Statement No improvement in R knee ROM. Pt does do exercises well but in a small ROM. Pt continues to be guarded ands self limiting with her ROM. Pt often uses compensatory strategies when moving around clinic.   Pt will benefit from skilled therapeutic intervention in order to improve on the following deficits Abnormal gait;Decreased range of motion;Difficulty walking;Pain;Decreased strength   Rehab  Potential Good   PT Frequency 3x / week   PT Duration 6 weeks   PT Treatment/Interventions Cryotherapy;Electrical Stimulation;Moist Heat;Therapeutic exercise;Therapeutic activities;Functional mobility training;Gait training;Ultrasound;Patient/family education;Manual techniques;Vasopneumatic Device;Passive range of motion   PT Next Visit Plan functional strengthening        Problem List Patient Active Problem List   Diagnosis Date Noted  . 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 Primary osteoarthritis of right knee   . Knee pain, right 11/29/2014  . Excess skin of arm 11/29/2014  . Osteopenia 08/12/2013  . 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 Prediabetes 08/07/2011  . Vitamin D deficiency 08/07/2011  . Abnormal facial hair 03/29/2011  . PERSONAL HX COLONIC POLYPS 07/05/2010  . KNEE, ARTHRITIS, DEGEN./OSTEO 01/16/2010  . Vitamin B12 deficiency disease 10/28/2008  . 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 Obesity 08/15/2007  . Essential hypertension 08/15/2007    Scot Jun, PTA 06/16/2015, 11:58 AM  [place], [place], [address] Phone: [phone]   Fax:  [phone]  Name: MYALEE STENGEL MRN: [id number] Date of Birth: [birth year]

## 2015-06-21 ENCOUNTER — Encounter: Payer: Self-pay | Admitting: Physical Therapy

## 2015-06-21 ENCOUNTER — Ambulatory Visit: Payer: Federal, State, Local not specified - PPO | Admitting: Physical Therapy

## 2015-06-21 ENCOUNTER — Encounter (HOSPITAL_BASED_OUTPATIENT_CLINIC_OR_DEPARTMENT_OTHER): Payer: Federal, State, Local not specified - PPO

## 2015-06-21 VITALS — BP 117/72 | HR 88 | Temp 97.6°F | Resp 16

## 2015-06-21 DIAGNOSIS — M24669 Ankylosis, unspecified knee: Secondary | ICD-10-CM | POA: Diagnosis not present

## 2015-06-21 DIAGNOSIS — R262 Difficulty in walking, not elsewhere classified: Secondary | ICD-10-CM

## 2015-06-21 DIAGNOSIS — M7989 Other specified soft tissue disorders: Secondary | ICD-10-CM

## 2015-06-21 DIAGNOSIS — E538 Deficiency of other specified B group vitamins: Secondary | ICD-10-CM | POA: Diagnosis not present

## 2015-06-21 DIAGNOSIS — M25661 Stiffness of right knee, not elsewhere classified: Secondary | ICD-10-CM

## 2015-06-21 DIAGNOSIS — M25669 Stiffness of unspecified knee, not elsewhere classified: Secondary | ICD-10-CM

## 2015-06-21 DIAGNOSIS — M25561 Pain in right knee: Secondary | ICD-10-CM

## 2015-06-21 MED ORDER — CYANOCOBALAMIN 1000 MCG/ML IJ SOLN
INTRAMUSCULAR | Status: AC
  Filled 2015-06-21: qty 1

## 2015-06-21 MED ORDER — CYANOCOBALAMIN 1000 MCG/ML IJ SOLN
1000.0000 ug | Freq: Once | INTRAMUSCULAR | Status: AC
  Administered 2015-06-21: 1000 ug via INTRAMUSCULAR

## 2015-06-21 NOTE — Patient Instructions (Signed)
[place] at [address] [facility] Discharge Instructions  RECOMMENDATIONS MADE BY THE CONSULTANT AND ANY TEST RESULTS WILL BE SENT TO YOUR REFERRING PHYSICIAN.  Vitamin B12 1000 mcg injection given as ordered.  Thank you for choosing [place] at [facility] to provide your oncology and hematology care.  To afford each patient quality time with our provider, please arrive at least 15 minutes before your scheduled appointment time.   Beginning January 23rd 2017 lab work for the [facility] will be done in the  Main lab at [facility] on 1st floor. If you have a lab appointment with the [facility] please come in thru the  Main Entrance and check in at the main information desk  You need to re-schedule your appointment should you arrive 10 or more minutes late.  We strive to give you quality time with our providers, and arriving late affects you and other patients whose appointments are after yours.  Also, if you no show three or more times for appointments you may be dismissed from the clinic at the providers discretion.     Again, thank you for choosing [facility].  Our hope is that these requests will decrease the amount of time that you wait before being seen by our physicians.       _____________________________________________________________  Should you have questions after your visit to [facility], please contact our office at [phone] between the hours of 8:30 a.m. and 4:30 p.m.  Voicemails left after 4:30 p.m. will not be returned until the following business day.  For prescription refill requests, have your pharmacy contact our office.

## 2015-06-21 NOTE — Therapy (Signed)
[place], [place], [address] Phone: [phone]   Fax:  [phone]  Physical Therapy Treatment  Patient Details  Name: Sheri Jones MRN: [id number] Date of Birth: [birth year] Referring Provider: [name]  Encounter Date: 06/21/2015      PT End of Session - 06/21/15 1141    Visit Number 9   Date for PT Re-Evaluation 07/21/15   PT Start Time 1100   PT Stop Time 1142   PT Time Calculation (min) 42 min   Activity Tolerance Patient tolerated treatment well;Patient limited by pain   Behavior During Therapy [facility] for tasks assessed/performed      Past Medical History  Diagnosis Date  . Allergic rhinitis due to pollen   . Osteoarthrosis, unspecified whether generalized or localized, lower leg   . Obesity, unspecified   . Unspecified essential hypertension   . Anemia   . Diabetes mellitus without complication ([place])   . 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 Vitamin B12 deficiency disease 10/28/2008    Qualifier: Diagnosis of  By: Moshe Cipro MD, Joycelyn Schmid      Past Surgical History  Procedure Laterality Date  . Gastric bypass  2004  . Colonoscopy  2008  . Total knee arthroplasty Right 02/23/2015    Procedure: RIGHT TOTAL KNEE ARTHROPLASTY;  Surgeon: Carole Civil, MD;  Location: [facility];  Service: Orthopedics;  Laterality: Right;  . Exam under anesthesia with manipulation of knee Right 05/11/2015    Procedure: EXAM UNDER ANESTHESIA WITH MANIPULATION OF KNEE;  Surgeon: Carole Civil, MD;  Location: [facility];  Service: Orthopedics;  Laterality: Right;    There were no vitals filed for this visit.  Visit Diagnosis:  Knee stiffness, right  Right knee pain  Difficulty walking  Decreased range of motion (ROM) of knee  Swelling of limb      Subjective Assessment - 06/21/15 1101    Subjective "My leg is hurting today, I didn't walk much this weekend because I had my  mother"   Currently in Pain? Yes   Pain Score 4    Pain Location Knee                         [facility] Adult PT Treatment/Exercise - 06/21/15 0001    Ambulation/Gait   Stairs Yes   Stairs Assistance 6: Modified independent (Device/Increase time)   Stair Management Technique No rails;One rail Right;Alternating pattern   Number of Stairs 24   Height of Stairs 6   Gait Comments x2, Pt nervous and hesitant with stair negotiation. R hip hike in order to clear RLE mod tactile cues to correct.   Knee/Hip Exercises: Aerobic   Stationary Bike 5 min R hip hike to make full revolutions   Nustep L4 x6 min    Knee/Hip Exercises: Standing   Lateral Step Up 5 sets;Right;Hand Hold: 1;Step Height: 8"   Other Standing Knee Exercises Standing box taps 8 in #5 4x5 each    Knee/Hip Exercises: Seated   Long Arc Quad Right;10 reps;3 sets   Long Arc Quad Weight 5 lbs.   Other Seated Knee/Hip Exercises 3x10   Marching Weights 5 lbs.                  PT Short Term Goals - 05/26/15 1101    PT SHORT TERM GOAL #1   Title Pt will be independent with initial HEP  Status Partially Met           PT Long Term Goals - 05/23/15 1452    PT LONG TERM GOAL #1   Title Pt will increase right knee AROM to 5-110.   Time 6   Period Weeks   Status New   PT LONG TERM GOAL #2   Title Pt will walk 500 feet with minimal deviation.   Time 6   Period Weeks   Status New   PT LONG TERM GOAL #3   Title Pt will report no more than 2/10 pain with active knee flexion.   Time 6   Period Weeks   Status New               Plan - 06/21/15 1144    Clinical Impression Statement Pt ~ to bend RLE on NuStep machine more that with AROM. Tolerated a more active treatment but fatigues with stair negotiation. Hesitant with lateral step up. Pt very unsure about abilities, require cues for confidence.   Pt will benefit from skilled therapeutic intervention in order to improve on the following  deficits Abnormal gait;Decreased range of motion;Difficulty walking;Pain;Decreased strength   Rehab Potential Good   PT Frequency 3x / week   PT Duration 6 weeks   PT Treatment/Interventions Cryotherapy;Electrical Stimulation;Moist Heat;Therapeutic exercise;Therapeutic activities;Functional mobility training;Gait training;Ultrasound;Patient/family education;Manual techniques;Vasopneumatic Device;Passive range of motion   PT Next Visit Plan functional strengthening        Problem List Patient Active Problem List   Diagnosis Date Noted  . 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 Primary osteoarthritis of right knee   . Knee pain, right 11/29/2014  . Excess skin of arm 11/29/2014  . Osteopenia 08/12/2013  . 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 Prediabetes 08/07/2011  . Vitamin D deficiency 08/07/2011  . Abnormal facial hair 03/29/2011  . PERSONAL HX COLONIC POLYPS 07/05/2010  . KNEE, ARTHRITIS, DEGEN./OSTEO 01/16/2010  . Vitamin B12 deficiency disease 10/28/2008  . 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 Obesity 08/15/2007  . Essential hypertension 08/15/2007    Scot Jun, PTA  06/21/2015, 11:47 AM  [place], [place], [address] Phone: [phone]   Fax:  [phone]  Name: Sheri Jones MRN: [id number] Date of Birth: Jan 14, 1946

## 2015-06-21 NOTE — Progress Notes (Signed)
Sheri Jones presents today for injection per MD orders. B12 1000 mcg  administered IM in right upper outer Gluteal. Administration without incident. Patient tolerated well.  

## 2015-06-23 ENCOUNTER — Encounter: Payer: Self-pay | Admitting: Physical Therapy

## 2015-06-23 ENCOUNTER — Ambulatory Visit: Payer: Federal, State, Local not specified - PPO | Attending: Orthopedic Surgery | Admitting: Physical Therapy

## 2015-06-23 DIAGNOSIS — M25661 Stiffness of right knee, not elsewhere classified: Secondary | ICD-10-CM | POA: Diagnosis not present

## 2015-06-23 DIAGNOSIS — M7989 Other specified soft tissue disorders: Secondary | ICD-10-CM | POA: Diagnosis present

## 2015-06-23 DIAGNOSIS — M24669 Ankylosis, unspecified knee: Secondary | ICD-10-CM | POA: Insufficient documentation

## 2015-06-23 DIAGNOSIS — M25561 Pain in right knee: Secondary | ICD-10-CM

## 2015-06-23 DIAGNOSIS — M25669 Stiffness of unspecified knee, not elsewhere classified: Secondary | ICD-10-CM

## 2015-06-23 DIAGNOSIS — R262 Difficulty in walking, not elsewhere classified: Secondary | ICD-10-CM | POA: Insufficient documentation

## 2015-06-23 NOTE — Therapy (Signed)
[place], [place], [address] Phone: [phone]   Fax:  [phone]  Physical Therapy Treatment  Patient Details  Name: Sheri Jones MRN: [id number] Date of Birth: 01-09-1946 Referring Provider: [name]  Encounter Date: 06/23/2015      PT End of Session - 06/23/15 1144    Visit Number 10   PT Start Time 1109   PT Stop Time 1144   PT Time Calculation (min) 35 min   Activity Tolerance Patient tolerated treatment well;Patient limited by pain   Behavior During Therapy [facility] for tasks assessed/performed      Past Medical History  Diagnosis Date  . Allergic rhinitis due to pollen   . Osteoarthrosis, unspecified whether generalized or localized, lower leg   . Obesity, unspecified   . Unspecified essential hypertension   . Anemia   . Diabetes mellitus without complication ([place])   . 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 Vitamin B12 deficiency disease 10/28/2008    Qualifier: Diagnosis of  By: Moshe Cipro MD, Joycelyn Schmid      Past Surgical History  Procedure Laterality Date  . Gastric bypass  2004  . Colonoscopy  2008  . Total knee arthroplasty Right 02/23/2015    Procedure: RIGHT TOTAL KNEE ARTHROPLASTY;  Surgeon: Carole Civil, MD;  Location: [facility];  Service: Orthopedics;  Laterality: Right;  . Exam under anesthesia with manipulation of knee Right 05/11/2015    Procedure: EXAM UNDER ANESTHESIA WITH MANIPULATION OF KNEE;  Surgeon: Carole Civil, MD;  Location: [facility];  Service: Orthopedics;  Laterality: Right;    There were no vitals filed for this visit.  Visit Diagnosis:  Knee stiffness, right  Right knee pain  Difficulty walking  Decreased range of motion (ROM) of knee  Swelling of limb      Subjective Assessment - 06/23/15 1109    Subjective "Im sorry that Im late, everything is good"   Currently in Pain? No/denies   Pain Score 0-No pain                          [facility] Adult PT Treatment/Exercise - 06/23/15 0001    Ambulation/Gait   Stairs Yes   Stairs Assistance 6: Modified independent (Device/Increase time)   Stair Management Technique One rail Right   Number of Stairs 24   Height of Stairs 6   Gait Comments x2, Pt nervous and hesitant with stair negotiation. R hip hike in order to clear RLE mod tactile cues to correct.   Knee/Hip Exercises: Aerobic   Stationary Bike 5 min R hip hike to make full revolutions  seat level 7    Nustep L4 x6 min    Knee/Hip Exercises: Machines for Strengthening   Cybex Knee Extension 5# 2x10 RLE    Cybex Knee Flexion #10  2x1 RLE    Cybex Leg Press 40# 3x10,  multiple bouts no weight to work on stretch   Knee/Hip Exercises: Standing   Lateral Step Up Right;Step Height: 8";2 sets;10 reps;Hand Hold: 0                  PT Short Term Goals - 05/26/15 1101    PT SHORT TERM GOAL #1   Title Pt will be independent with initial HEP   Status Partially Met           PT Long Term Goals - 05/23/15 1452  PT LONG TERM GOAL #1   Title Pt will increase right knee AROM to 5-110.   Time 6   Period Weeks   Status New   PT LONG TERM GOAL #2   Title Pt will walk 500 feet with minimal deviation.   Time 6   Period Weeks   Status New   PT LONG TERM GOAL #3   Title Pt will report no more than 2/10 pain with active knee flexion.   Time 6   Period Weeks   Status New               Plan - 06/23/15 1146    Clinical Impression Statement Pt 9 minutes late for PT session. Pt again gives resistance with intervention promoting R knee flexion. Tolerated today interventions well with pt available ROM. Fatigues easily with stair negotiation.    Pt will benefit from skilled therapeutic intervention in order to improve on the following deficits Abnormal gait;Decreased range of motion;Difficulty walking;Pain;Decreased strength   Rehab Potential Good   PT Frequency 3x /  week   PT Duration 6 weeks   PT Treatment/Interventions Cryotherapy;Electrical Stimulation;Moist Heat;Therapeutic exercise;Therapeutic activities;Functional mobility training;Gait training;Ultrasound;Patient/family education;Manual techniques;Vasopneumatic Device;Passive range of motion   PT Next Visit Plan functional strengthening        Problem List Patient Active Problem List   Diagnosis Date Noted  . Arthrofibrosis of total knee arthroplasty ([facility]) 05/11/2015  . Primary osteoarthritis of right knee   . Knee pain, right 11/29/2014  . Excess skin of arm 11/29/2014  . Osteopenia 08/12/2013  . 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 Prediabetes 08/07/2011  . Vitamin D deficiency 08/07/2011  . Abnormal facial hair 03/29/2011  . PERSONAL HX COLONIC POLYPS 07/05/2010  . KNEE, ARTHRITIS, DEGEN./OSTEO 01/16/2010  . Vitamin B12 deficiency disease 10/28/2008  . 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 Obesity 08/15/2007  . Essential hypertension 08/15/2007    Ronald G Pemberton, PTA  06/23/2015, 11:50 AM  [facility] [address] [place], [place], [address] Phone: [phone]   Fax:  [phone]  Name: Assia S Errickson MRN: [id number] Date of Birth: 12/15/1945     

## 2015-06-27 ENCOUNTER — Ambulatory Visit: Payer: Federal, State, Local not specified - PPO | Admitting: Physical Therapy

## 2015-06-27 ENCOUNTER — Encounter: Payer: Self-pay | Admitting: Physical Therapy

## 2015-06-27 DIAGNOSIS — M25661 Stiffness of right knee, not elsewhere classified: Secondary | ICD-10-CM | POA: Diagnosis not present

## 2015-06-27 DIAGNOSIS — M25561 Pain in right knee: Secondary | ICD-10-CM

## 2015-06-27 DIAGNOSIS — R262 Difficulty in walking, not elsewhere classified: Secondary | ICD-10-CM

## 2015-06-27 DIAGNOSIS — M25669 Stiffness of unspecified knee, not elsewhere classified: Secondary | ICD-10-CM

## 2015-06-27 NOTE — Therapy (Signed)
[place], [place], [address] Phone: [phone]   Fax:  [phone]  Physical Therapy Treatment  Patient Details  Name: Sheri Jones MRN: [id number] Date of Birth: [birth year] Referring Provider: [name]  Encounter Date: 06/27/2015      PT End of Session - 06/27/15 1346    Visit Number 11   Date for PT Re-Evaluation 07/21/15   PT Start Time 1300   PT Stop Time 1345   PT Time Calculation (min) 45 min   Activity Tolerance Patient tolerated treatment well   Behavior During Therapy [facility] for tasks assessed/performed      Past Medical History  Diagnosis Date  . Allergic rhinitis due to pollen   . Osteoarthrosis, unspecified whether generalized or localized, lower leg   . Obesity, unspecified   . Unspecified essential hypertension   . Anemia   . Diabetes mellitus without complication ([place])   . 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 Vitamin B12 deficiency disease 10/28/2008    Qualifier: Diagnosis of  By: Moshe Cipro MD, Joycelyn Schmid      Past Surgical History  Procedure Laterality Date  . Gastric bypass  2004  . Colonoscopy  2008  . Total knee arthroplasty Right 02/23/2015    Procedure: RIGHT TOTAL KNEE ARTHROPLASTY;  Surgeon: Carole Civil, MD;  Location: [facility];  Service: Orthopedics;  Laterality: Right;  . Exam under anesthesia with manipulation of knee Right 05/11/2015    Procedure: EXAM UNDER ANESTHESIA WITH MANIPULATION OF KNEE;  Surgeon: Carole Civil, MD;  Location: [facility];  Service: Orthopedics;  Laterality: Right;    There were no vitals filed for this visit.  Visit Diagnosis:  Knee stiffness, right  Right knee pain  Difficulty walking  Decreased range of motion (ROM) of knee      Subjective Assessment - 06/27/15 1303    Subjective "I feel like Im feeling a little bit better"   Currently in Pain? No/denies   Pain Score 0-No pain            [facility]  PT Assessment - 06/27/15 0001    AROM   Overall AROM Comments R knee 15-83 deg  flex                     [facility] Adult PT Treatment/Exercise - 06/27/15 0001    Ambulation/Gait   Ambulation/Gait Yes   Ambulation/Gait Assistance 6: Modified independent (Device/Increase time)   Ambulation Distance (Feet) --  >500   Assistive device None   Gait Pattern Step-through pattern   Ambulation Surface Outdoor;Unlevel;Paved  up and down hill   Stairs Yes   Stairs Assistance 6: Modified independent (Device/Increase time)   Stair Management Technique One rail Right;One rail Left   Number of Stairs 24   Height of Stairs 6   Gait Comments x3, Pt nervous and hesitant with stairs negotiation. R hip hike in order to clear RLE mod tactile cues to correct.   Knee/Hip Exercises: Aerobic   Nustep L4 x6 min    Knee/Hip Exercises: Standing   Other Standing Knee Exercises resisted walking #30 4 way x3                  PT Short Term Goals - 05/26/15 1101    PT SHORT TERM GOAL #1   Title Pt will be independent with initial HEP   Status Partially Met  PT Long Term Goals - 05/23/15 1452    PT LONG TERM GOAL #1   Title Pt will increase right knee AROM to 5-110.   Time 6   Period Weeks   Status New   PT LONG TERM GOAL #2   Title Pt will walk 500 feet with minimal deviation.   Time 6   Period Weeks   Status New   PT LONG TERM GOAL #3   Title Pt will report no more than 2/10 pain with active knee flexion.   Time 6   Period Weeks   Status New               Plan - 06/27/15 1347    Clinical Impression Statement Pt quickly fatigues with functional activities. Able to amb outside up and down slopes, demos R quad weakness with eccentric loads walking down hill. Difficulty with resistive walking  unable to push off with RLE with resisted gait with backwards walking.    Pt will benefit from skilled therapeutic intervention in order to improve on the following  deficits Abnormal gait;Decreased range of motion;Difficulty walking;Pain;Decreased strength   Rehab Potential Good   PT Frequency 3x / week   PT Duration 6 weeks   PT Treatment/Interventions Cryotherapy;Electrical Stimulation;Moist Heat;Therapeutic exercise;Therapeutic activities;Functional mobility training;Gait training;Ultrasound;Patient/family education;Manual techniques;Vasopneumatic Device;Passive range of motion   PT Next Visit Plan functional strengthening        Problem List Patient Active Problem List   Diagnosis Date Noted  . 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 Primary osteoarthritis of right knee   . Knee pain, right 11/29/2014  . Excess skin of arm 11/29/2014  . Osteopenia 08/12/2013  . 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 Prediabetes 08/07/2011  . Vitamin D deficiency 08/07/2011  . Abnormal facial hair 03/29/2011  . PERSONAL HX COLONIC POLYPS 07/05/2010  . KNEE, ARTHRITIS, DEGEN./OSTEO 01/16/2010  . Vitamin B12 deficiency disease 10/28/2008  . 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 Obesity 08/15/2007  . Essential hypertension 08/15/2007    Scot Jun, PTA 06/27/2015, 1:53 PM  [place], [place], [address] Phone: [phone]   Fax:  [phone]  Name: Sheri Jones MRN: [id number] Date of Birth: 07/24/1945

## 2015-06-28 ENCOUNTER — Encounter: Payer: Self-pay | Admitting: Orthopedic Surgery

## 2015-06-28 ENCOUNTER — Encounter: Payer: Self-pay | Admitting: Family Medicine

## 2015-06-28 ENCOUNTER — Ambulatory Visit (INDEPENDENT_AMBULATORY_CARE_PROVIDER_SITE_OTHER): Payer: Federal, State, Local not specified - PPO | Admitting: Family Medicine

## 2015-06-28 ENCOUNTER — Ambulatory Visit: Payer: Medicare Other | Admitting: Orthopedic Surgery

## 2015-06-28 VITALS — BP 134/79 | Ht 63.0 in | Wt 174.0 lb

## 2015-06-28 VITALS — BP 108/64 | HR 96 | Resp 18 | Ht 63.0 in | Wt 173.0 lb

## 2015-06-28 DIAGNOSIS — E559 Vitamin D deficiency, unspecified: Secondary | ICD-10-CM | POA: Diagnosis not present

## 2015-06-28 DIAGNOSIS — Z4789 Encounter for other orthopedic aftercare: Secondary | ICD-10-CM

## 2015-06-28 DIAGNOSIS — Z96651 Presence of right artificial knee joint: Secondary | ICD-10-CM

## 2015-06-28 DIAGNOSIS — Z1159 Encounter for screening for other viral diseases: Secondary | ICD-10-CM

## 2015-06-28 DIAGNOSIS — I10 Essential (primary) hypertension: Secondary | ICD-10-CM | POA: Diagnosis not present

## 2015-06-28 DIAGNOSIS — R7302 Impaired glucose tolerance (oral): Secondary | ICD-10-CM

## 2015-06-28 DIAGNOSIS — Z0000 Encounter for general adult medical examination without abnormal findings: Secondary | ICD-10-CM

## 2015-06-28 DIAGNOSIS — R7303 Prediabetes: Secondary | ICD-10-CM

## 2015-06-28 MED ORDER — METHOCARBAMOL 500 MG PO TABS
500.0000 mg | ORAL_TABLET | Freq: Four times a day (QID) | ORAL | Status: DC | PRN
Start: 2015-06-28 — End: 2016-09-10

## 2015-06-28 NOTE — Progress Notes (Signed)
Subjective:    Patient ID: Sheri Jones, female    DOB: 1946-03-08, 70 y.o.   MRN: [id number]  HPI Preventive Screening-Counseling & Management   Patient present here today for a Medicare annual wellness visit.   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  Yes, but thinks related to decreased mobility Over the past two weeks, have you felt little interest or pleasure in doing things? Yes, see above Have you lost interest or pleasure in daily life? Yes,  Do you often feel hopeless? No Do you cry easily over simple problems? No   Activities of Daily Living  In your present state of health, do you have any difficulty performing the following activities?  Driving?: No Managing money?: No Feeding yourself?:No Getting from bed to chair?:No Climbing a flight of stairs?: Yes due to right knee surgery 04/2015 Preparing food and eating?:No Bathing or showering?:No Getting dressed?:No Getting to the toilet?:No Using the toilet?:No Moving around from place to place?: Yes, right knee surgery 04/2015  Fall Risk Assessmentyiness?:No   Hearing Difficulties: No Do you often ask people to speak up or repeat themselves?:No Do you experience ringing or noises in your ears?:No Do you have difficulty understanding soft or whispered voices?:No  Cognitive Testing  Alert? Yes Normal Appearance?Yes  Oriented to person? Yes Place? Yes   Time? Yes  Displays appropriate judgment?Yes  Can read the correct time from a watch face? yes Are you having problems remembering things?No age appropriate  Advanced Directives have been discussed with the patient?Yes,full code, and brochure/forms provided    List the Names of Other Physician/Practitioners you currently use: careteams updated    Indicate any recent Medical Services you may have received from other than [facility] providers in the past year (date may be approximate).   Assessment:    Annual Wellness Exam   Plan:    .  Medicare Attestation  I have personally reviewed:  The patient's medical and social history  Their use of alcohol, tobacco or illicit drugs  Their current medications and supplements  The patient's functional ability including ADLs,fall risks, home safety risks, cognitive, and hearing and visual impairment  Diet and physical activities  Evidence for depression or mood disorders  The patient's weight, height, BMI, and visual acuity have been recorded in the chart. I have made referrals, counseling, and provided education to the patient based on review of the above and I have provided the patient with a written personalized care plan for preventive services.      Review of Systems     Objective:   Physical Exam BP 108/64 mmHg  Pulse 96  Resp 18  Ht 5\' 3"  (1.6 m)  Wt 173 lb 0.6 oz (78.49 kg)  BMI 30.66 kg/m2  SpO2 99%         Assessment & Plan:  Medicare annual wellness visit, subsequent Annual exam as documented. Counseling done  re healthy lifestyle involving commitment to 150 minutes exercise per week, heart healthy diet, and attaining healthy weight.The importance of adequate sleep also discussed. Regular seat belt use and home safety, is also discussed. Changes in health habits are decided on by the patient with goals and time frames  set for achieving them. Immunization and cancer screening needs are specifically addressed at this  visit.

## 2015-06-28 NOTE — Patient Instructions (Addendum)
F/u in 5 month with rectal, call if you need me before  Labs today, lipid, Hep C, TSH, HBA1C , vit D, cmp and EGFR  Please continue to follow through with therapy, and keep your food intake down so that you continue to lose weight  Thanks for choosing [facility], we consider it a privelige to serve you.   I suggest OTC benadryl 25 mg capsule one at night , as needed , for help with allergies and poor sleep

## 2015-06-28 NOTE — Progress Notes (Signed)
Second postop visit status post manipulation of right knee on 05/11/2015 she's 5-90 degrees now. In the hospital and on the table we got her to 120 fairly easily after breaking up the scar tissue but she is right back to 90. I feel most of her problem is just effort and she has slipped back into contracture. She is working hard at this point but it's probably too late.  An this point will just do his best we can with therapy and I'll see her again in a month she is not having any significant pain and wants a refill on her muscle relaxer

## 2015-06-28 NOTE — Patient Instructions (Signed)
MEDICATION SENT TO YOUR PHARMACY 

## 2015-06-28 NOTE — Assessment & Plan Note (Signed)

## 2015-06-29 ENCOUNTER — Encounter: Payer: Self-pay | Admitting: Family Medicine

## 2015-06-29 LAB — HEMOGLOBIN A1C
HEMOGLOBIN A1C: 5.7 % — AB (ref <5.7)
Mean Plasma Glucose: 117 mg/dL — ABNORMAL HIGH (ref <117)

## 2015-06-29 LAB — COMPLETE METABOLIC PANEL WITH GFR
ALBUMIN: 3.7 g/dL (ref 3.6–5.1)
ALT: 14 U/L (ref 6–29)
AST: 20 U/L (ref 10–35)
Alkaline Phosphatase: 61 U/L (ref 33–130)
BUN: 18 mg/dL (ref 7–25)
CALCIUM: 9.4 mg/dL (ref 8.6–10.4)
CHLORIDE: 105 mmol/L (ref 98–110)
CO2: 29 mmol/L (ref 20–31)
CREATININE: 0.84 mg/dL (ref 0.50–0.99)
GFR, Est African American: 83 mL/min (ref >=60)
GFR, Est Non African American: 72 mL/min (ref >=60)
GLUCOSE: 76 mg/dL (ref 65–99)
Potassium: 3.5 mmol/L (ref 3.5–5.3)
SODIUM: 141 mmol/L (ref 135–146)
Total Bilirubin: 0.4 mg/dL (ref 0.2–1.2)
Total Protein: 6.2 g/dL (ref 6.1–8.1)

## 2015-06-29 LAB — LIPID PANEL
CHOL/HDL RATIO: 2.3 ratio (ref <=5.0)
CHOLESTEROL: 168 mg/dL (ref 125–200)
HDL: 73 mg/dL (ref >=46)
LDL Cholesterol: 82 mg/dL (ref <130)
Triglycerides: 65 mg/dL (ref <150)
VLDL: 13 mg/dL (ref <30)

## 2015-06-29 LAB — VITAMIN D 25 HYDROXY (VIT D DEFICIENCY, FRACTURES): Vit D, 25-Hydroxy: 61 ng/mL (ref 30–100)

## 2015-06-29 LAB — TSH: TSH: 0.54 mIU/L

## 2015-06-29 LAB — HEPATITIS C ANTIBODY: HCV Ab: NEGATIVE

## 2015-06-30 ENCOUNTER — Ambulatory Visit: Payer: Federal, State, Local not specified - PPO | Admitting: Physical Therapy

## 2015-06-30 ENCOUNTER — Encounter: Payer: Self-pay | Admitting: Physical Therapy

## 2015-06-30 DIAGNOSIS — M25561 Pain in right knee: Secondary | ICD-10-CM

## 2015-06-30 DIAGNOSIS — M7989 Other specified soft tissue disorders: Secondary | ICD-10-CM

## 2015-06-30 DIAGNOSIS — M25661 Stiffness of right knee, not elsewhere classified: Secondary | ICD-10-CM

## 2015-06-30 DIAGNOSIS — M25669 Stiffness of unspecified knee, not elsewhere classified: Secondary | ICD-10-CM

## 2015-06-30 DIAGNOSIS — R262 Difficulty in walking, not elsewhere classified: Secondary | ICD-10-CM

## 2015-06-30 NOTE — Therapy (Signed)
[place], [place], [address] Phone: [phone]   Fax:  [phone]  Physical Therapy Treatment  Patient Details  Name: Sheri Jones MRN: [id number] Date of Birth: 11-13-1945 Referring Provider: [name]  Encounter Date: 06/30/2015      PT End of Session - 06/30/15 1341    Visit Number 12   Date for PT Re-Evaluation 07/21/15   PT Start Time 1300   PT Stop Time 1341   PT Time Calculation (min) 41 min   Activity Tolerance Patient tolerated treatment well   Behavior During Therapy [facility] for tasks assessed/performed      Past Medical History  Diagnosis Date  . Allergic rhinitis due to pollen   . Osteoarthrosis, unspecified whether generalized or localized, lower leg   . Obesity, unspecified   . Unspecified essential hypertension   . Anemia   . Diabetes mellitus without complication ([place])   . 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 Vitamin B12 deficiency disease 10/28/2008    Qualifier: Diagnosis of  By: Moshe Cipro MD, Joycelyn Schmid      Past Surgical History  Procedure Laterality Date  . Gastric bypass  2004  . Colonoscopy  2008  . Total knee arthroplasty Right 02/23/2015    Procedure: RIGHT TOTAL KNEE ARTHROPLASTY;  Surgeon: Carole Civil, MD;  Location: [facility];  Service: Orthopedics;  Laterality: Right;  . Exam under anesthesia with manipulation of knee Right 05/11/2015    Procedure: EXAM UNDER ANESTHESIA WITH MANIPULATION OF KNEE;  Surgeon: Carole Civil, MD;  Location: [facility];  Service: Orthopedics;  Laterality: Right;    There were no vitals filed for this visit.  Visit Diagnosis:  Knee stiffness, right  Right knee pain  Difficulty walking  Decreased range of motion (ROM) of knee  Swelling of limb      Subjective Assessment - 06/30/15 1301    Subjective Pt went to MD, Pt stated that the MD said that she was doing good for her   Currently in Pain?  No/denies   Pain Score 0-No pain                         [facility] Adult PT Treatment/Exercise - 06/30/15 0001    Ambulation/Gait   Ambulation/Gait Yes   Ambulation/Gait Assistance 6: Modified independent (Device/Increase time)   Ambulation Distance (Feet) --  >500 ft   Assistive device None   Gait Pattern Step-through pattern   Ambulation Surface Outdoor;Paved;Unlevel   Stairs Yes   Stairs Assistance 6: Modified independent (Device/Increase time)   Stair Management Technique One rail Right;One rail Left   Number of Stairs 24   Height of Stairs 6   Gait Comments x3, Pt nervous and hesitant with stair negotiation. R hip hike in order to clear RLE mod tactile cues to correct.   Knee/Hip Exercises: Aerobic   Stationary Bike 5 min R hip hike to make full revolutions   Nustep L4 x6 min    Knee/Hip Exercises: Machines for Strengthening   Cybex Knee Extension 5# 2x10 RLE    Cybex Knee Flexion #10  3x10 RLE    Cybex Leg Press 40# 3x10                  PT Short Term Goals - 05/26/15 1101    PT SHORT TERM GOAL #1   Title Pt will be independent with initial HEP   Status  Partially Met           PT Long Term Goals - 05/23/15 1452    PT LONG TERM GOAL #1   Title Pt will increase right knee AROM to 5-110.   Time 6   Period Weeks   Status New   PT LONG TERM GOAL #2   Title Pt will walk 500 feet with minimal deviation.   Time 6   Period Weeks   Status New   PT LONG TERM GOAL #3   Title Pt will report no more than 2/10 pain with active knee flexion.   Time 6   Period Weeks   Status New               Plan - 06/30/15 1341    Clinical Impression Statement R quad weakness with eccentric loading remains. Ambulated outside well but fatigues quick le with up hill walking. R knee AROM remains limited. Pt reports the MD is pleased with her level of function.   Pt will benefit from skilled therapeutic intervention in order to improve on the following  deficits Abnormal gait;Decreased range of motion;Difficulty walking;Pain;Decreased strength   Rehab Potential Good   PT Frequency 3x / week   PT Duration 6 weeks   PT Treatment/Interventions Cryotherapy;Electrical Stimulation;Moist Heat;Therapeutic exercise;Therapeutic activities;Functional mobility training;Gait training;Ultrasound;Patient/family education;Manual techniques;Vasopneumatic Device;Passive range of motion   PT Next Visit Plan functional strengthening        Problem List Patient Active Problem List   Diagnosis Date Noted  . Medicare annual wellness visit, subsequent 06/28/2015  . 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 Primary osteoarthritis of right knee   . Knee pain, right 11/29/2014  . Excess skin of arm 11/29/2014  . Osteopenia 08/12/2013  . 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 Prediabetes 08/07/2011  . Vitamin D deficiency 08/07/2011  . Abnormal facial hair 03/29/2011  . PERSONAL HX COLONIC POLYPS 07/05/2010  . KNEE, ARTHRITIS, DEGEN./OSTEO 01/16/2010  . Vitamin B12 deficiency disease 10/28/2008  . 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 Obesity 08/15/2007  . Essential hypertension 08/15/2007    Scot Jun, PTA  06/30/2015, 1:43 PM  [place], [place], [address] Phone: [phone]   Fax:  [phone]  Name: Sheri Jones MRN: [id number] Date of Birth: 1946-02-22

## 2015-07-01 ENCOUNTER — Other Ambulatory Visit: Payer: Self-pay | Admitting: Family Medicine

## 2015-07-04 ENCOUNTER — Ambulatory Visit: Payer: Federal, State, Local not specified - PPO | Admitting: Physical Therapy

## 2015-07-04 ENCOUNTER — Encounter: Payer: Self-pay | Admitting: Physical Therapy

## 2015-07-04 DIAGNOSIS — M7989 Other specified soft tissue disorders: Secondary | ICD-10-CM

## 2015-07-04 DIAGNOSIS — R262 Difficulty in walking, not elsewhere classified: Secondary | ICD-10-CM

## 2015-07-04 DIAGNOSIS — M25661 Stiffness of right knee, not elsewhere classified: Secondary | ICD-10-CM

## 2015-07-04 DIAGNOSIS — M25669 Stiffness of unspecified knee, not elsewhere classified: Secondary | ICD-10-CM

## 2015-07-04 DIAGNOSIS — M25561 Pain in right knee: Secondary | ICD-10-CM

## 2015-07-04 NOTE — Therapy (Signed)
[place], [place], [address] Phone: [phone]   Fax:  [phone]  Physical Therapy Treatment  Patient Details  Name: Sheri Jones MRN: [id number] Date of Birth: Jul 17, 1945 Referring Provider: [name]  Encounter Date: 07/04/2015      PT End of Session - 07/04/15 1341    Visit Number 13   Date for PT Re-Evaluation 07/21/15   PT Start Time 1307   PT Stop Time 1346   PT Time Calculation (min) 39 min      Past Medical History  Diagnosis Date  . Allergic rhinitis due to pollen   . Osteoarthrosis, unspecified whether generalized or localized, lower leg   . Obesity, unspecified   . Unspecified essential hypertension   . Anemia   . Diabetes mellitus without complication ([place])   . 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 Vitamin B12 deficiency disease 10/28/2008    Qualifier: Diagnosis of  By: Moshe Cipro MD, Joycelyn Schmid      Past Surgical History  Procedure Laterality Date  . Gastric bypass  2004  . Colonoscopy  2008  . Total knee arthroplasty Right 02/23/2015    Procedure: RIGHT TOTAL KNEE ARTHROPLASTY;  Surgeon: Carole Civil, MD;  Location: [facility];  Service: Orthopedics;  Laterality: Right;  . Exam under anesthesia with manipulation of knee Right 05/11/2015    Procedure: EXAM UNDER ANESTHESIA WITH MANIPULATION OF KNEE;  Surgeon: Carole Civil, MD;  Location: [facility];  Service: Orthopedics;  Laterality: Right;    There were no vitals filed for this visit.  Visit Diagnosis:  Knee stiffness, right  Right knee pain  Difficulty walking  Swelling of limb  Decreased range of motion (ROM) of knee      Subjective Assessment - 07/04/15 1307    Subjective "Good"   Currently in Pain? No/denies   Pain Score 0-No pain                         [facility] Adult PT Treatment/Exercise - 07/04/15 0001    Ambulation/Gait   Stairs Yes   Stairs Assistance 6:  Modified independent (Device/Increase time)   Stair Management Technique One rail Right;One rail Left   Number of Stairs 24   Height of Stairs 6   Gait Comments x3,  R hip hike in order to clear RLE mod tactile cues to correct.   Knee/Hip Exercises: Aerobic   Stationary Bike 5 min R hip hike to make full revolutions   Nustep L5 x6 min    Knee/Hip Exercises: Machines for Strengthening   Cybex Leg Press 40# 2x15   Knee/Hip Exercises: Standing   Forward Step Up Right;1 set;10 reps;Step Height: 6";Hand Hold: 0   Other Standing Knee Exercises resisted walking #30 4 way x4   Other Standing Knee Exercises Squats at sink x10                   PT Short Term Goals - 07/04/15 1311    PT SHORT TERM GOAL #1   Title Pt will be independent with initial HEP   Status Achieved           PT Long Term Goals - 07/04/15 1310    PT LONG TERM GOAL #1   Title Pt will increase right knee AROM to 5-110.   PT LONG TERM GOAL #2   Title Pt will walk 500 feet with minimal deviation.  Status Partially Met   PT LONG TERM GOAL #3   Title Pt will report no more than 2/10 pain with active knee flexion.   Status Achieved   PT LONG TERM GOAL #4   Title increase AROM to 10-110 degrees flexion   Status On-going               Plan - 07/04/15 1342    Clinical Impression Statement quickly fatigues with stair negotiation. Compensatory techniques uses with functional movements to substituent RLE ROM. Pt reports no functional limitation despite limited R knee ROM.   Pt will benefit from skilled therapeutic intervention in order to improve on the following deficits Abnormal gait;Decreased range of motion;Difficulty walking;Pain;Decreased strength   Rehab Potential Good   PT Frequency 3x / week   PT Duration 6 weeks   PT Treatment/Interventions Cryotherapy;Electrical Stimulation;Moist Heat;Therapeutic exercise;Therapeutic activities;Functional mobility training;Gait  training;Ultrasound;Patient/family education;Manual techniques;Vasopneumatic Device;Passive range of motion   PT Next Visit Plan functional strengthening        Problem List Patient Active Problem List   Diagnosis Date Noted  . Medicare annual wellness visit, subsequent 06/28/2015  . 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 Primary osteoarthritis of right knee   . Knee pain, right 11/29/2014  . Excess skin of arm 11/29/2014  . Osteopenia 08/12/2013  . 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 Prediabetes 08/07/2011  . Vitamin D deficiency 08/07/2011  . Abnormal facial hair 03/29/2011  . PERSONAL HX COLONIC POLYPS 07/05/2010  . KNEE, ARTHRITIS, DEGEN./OSTEO 01/16/2010  . Vitamin B12 deficiency disease 10/28/2008  . 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 Obesity 08/15/2007  . Essential hypertension 08/15/2007    Scot Jun, PTA  07/04/2015, 1:44 PM  [place], [place], [address] Phone: [phone]   Fax:  [phone]  Name: Sheri Jones MRN: [id number] Date of Birth: [birth year]

## 2015-07-05 ENCOUNTER — Other Ambulatory Visit: Payer: Self-pay | Admitting: Family Medicine

## 2015-07-07 ENCOUNTER — Ambulatory Visit: Payer: Federal, State, Local not specified - PPO | Admitting: Physical Therapy

## 2015-07-07 ENCOUNTER — Encounter: Payer: Self-pay | Admitting: Physical Therapy

## 2015-07-07 DIAGNOSIS — M7989 Other specified soft tissue disorders: Secondary | ICD-10-CM

## 2015-07-07 DIAGNOSIS — M25661 Stiffness of right knee, not elsewhere classified: Secondary | ICD-10-CM | POA: Diagnosis not present

## 2015-07-07 DIAGNOSIS — M25561 Pain in right knee: Secondary | ICD-10-CM

## 2015-07-07 DIAGNOSIS — M25669 Stiffness of unspecified knee, not elsewhere classified: Secondary | ICD-10-CM

## 2015-07-07 DIAGNOSIS — R262 Difficulty in walking, not elsewhere classified: Secondary | ICD-10-CM

## 2015-07-07 NOTE — Therapy (Signed)
[place] [address] [place], [place], [address] Phone: [phone]   Fax:  [phone]  Physical Therapy Treatment  Patient Details  Name: Sheri Jones MRN: [id number] Date of Birth: Jun 28, 1945 Referring Provider: [name]  Encounter Date: 07/07/2015      PT End of Session - 07/07/15 1056    Visit Number 14   Date for PT Re-Evaluation 07/21/15   PT Start Time 8115   PT Stop Time 1057   PT Time Calculation (min) 42 min      Past Medical History  Diagnosis Date  . Allergic rhinitis due to pollen   . Osteoarthrosis, unspecified whether generalized or localized, lower leg   . Obesity, unspecified   . Unspecified essential hypertension   . Anemia   . Diabetes mellitus without complication ([place])   . 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 Vitamin B12 deficiency disease 10/28/2008    Qualifier: Diagnosis of  By: Moshe Cipro MD, Joycelyn Schmid      Past Surgical History  Procedure Laterality Date  . Gastric bypass  2004  . Colonoscopy  2008  . Total knee arthroplasty Right 02/23/2015    Procedure: RIGHT TOTAL KNEE ARTHROPLASTY;  Surgeon: Carole Civil, MD;  Location: [facility];  Service: Orthopedics;  Laterality: Right;  . Exam under anesthesia with manipulation of knee Right 05/11/2015    Procedure: EXAM UNDER ANESTHESIA WITH MANIPULATION OF KNEE;  Surgeon: Carole Civil, MD;  Location: [facility];  Service: Orthopedics;  Laterality: Right;    There were no vitals filed for this visit.  Visit Diagnosis:  Knee stiffness, right  Difficulty walking  Swelling of limb  Right knee pain  Decreased range of motion (ROM) of knee      Subjective Assessment - 07/07/15 1016    Subjective "Good I walked up some real steep stairs the other day"   Currently in Pain? No/denies   Pain Score 0-No pain            [facility] PT Assessment - 07/07/15 0001    AROM   Overall AROM Comments R knee 24-84 deg   flex                     [facility] Adult PT Treatment/Exercise - 07/07/15 0001    Knee/Hip Exercises: Aerobic   Stationary Bike 5 min R hip hike to make full revolutions   Nustep L5 x6 min    Knee/Hip Exercises: Machines for Strengthening   Cybex Knee Extension 5# 2x15 RLE    Cybex Knee Flexion #10  2x15 RLE    Cybex Leg Press 40# 2x15   Knee/Hip Exercises: Standing   Lateral Step Up Right;Step Height: 8";2 sets;10 reps;Hand Hold: 0   Forward Step Up Right;10 reps;Step Height: 6";Hand Hold: 0;2 sets   Other Standing Knee Exercises resisted walking #30 4 way x4                  PT Short Term Goals - 07/04/15 1311    PT SHORT TERM GOAL #1   Title Pt will be independent with initial HEP   Status Achieved           PT Long Term Goals - 07/04/15 1310    PT LONG TERM GOAL #1   Title Pt will increase right knee AROM to 5-110.   PT LONG TERM GOAL #2   Title Pt will walk 500 feet with minimal deviation.  Status Partially Met   PT LONG TERM GOAL #3   Title Pt will report no more than 2/10 pain with active knee flexion.   Status Achieved   PT LONG TERM GOAL #4   Title increase AROM to 10-110 degrees flexion   Status On-going               Plan - 07/07/15 1057    Clinical Impression Statement Pt appears to have reached a functional plato. Again uses compensatory techniques to completed all interventions due to lack or R knee ROM.   Pt will benefit from skilled therapeutic intervention in order to improve on the following deficits Abnormal gait;Decreased range of motion;Difficulty walking;Pain;Decreased strength   Rehab Potential Fair   PT Frequency 3x / week   PT Duration 6 weeks   PT Treatment/Interventions Cryotherapy;Electrical Stimulation;Moist Heat;Therapeutic exercise;Therapeutic activities;Functional mobility training;Gait training;Ultrasound;Patient/family education;Manual techniques;Vasopneumatic Device;Passive range of motion   PT Next Visit  Plan D/C PT         Problem List Patient Active Problem List   Diagnosis Date Noted  . Medicare annual wellness visit, subsequent 06/28/2015  . 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 Primary osteoarthritis of right knee   . Knee pain, right 11/29/2014  . Excess skin of arm 11/29/2014  . Osteopenia 08/12/2013  . 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 Prediabetes 08/07/2011  . Vitamin D deficiency 08/07/2011  . Abnormal facial hair 03/29/2011  . PERSONAL HX COLONIC POLYPS 07/05/2010  . KNEE, ARTHRITIS, DEGEN./OSTEO 01/16/2010  . Vitamin B12 deficiency disease 10/28/2008  . 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 Obesity 08/15/2007  . Essential hypertension 08/15/2007    Scot Jun, PTA  07/07/2015, 10:59 AM  [place], [place], [address] Phone: [phone]   Fax:  [phone]  Name: MERIN BORJON MRN: [id number] Date of Birth: [birth year]

## 2015-07-14 ENCOUNTER — Ambulatory Visit: Payer: Federal, State, Local not specified - PPO | Admitting: Physical Therapy

## 2015-07-14 ENCOUNTER — Encounter: Payer: Self-pay | Admitting: Physical Therapy

## 2015-07-14 DIAGNOSIS — M25661 Stiffness of right knee, not elsewhere classified: Secondary | ICD-10-CM | POA: Diagnosis not present

## 2015-07-14 DIAGNOSIS — R262 Difficulty in walking, not elsewhere classified: Secondary | ICD-10-CM

## 2015-07-14 DIAGNOSIS — M25561 Pain in right knee: Secondary | ICD-10-CM

## 2015-07-14 DIAGNOSIS — M7989 Other specified soft tissue disorders: Secondary | ICD-10-CM

## 2015-07-14 DIAGNOSIS — M25669 Stiffness of unspecified knee, not elsewhere classified: Secondary | ICD-10-CM

## 2015-07-14 NOTE — Therapy (Signed)
[place], [place], [address] Phone: [phone]   Fax:  [phone]  Physical Therapy Treatment  Patient Details  Name: Sheri Jones MRN: [id number] Date of Birth: 1945-12-16 Referring Provider: [name]  Encounter Date: 07/14/2015      PT End of Session - 07/14/15 1139    Visit Number 15   Date for PT Re-Evaluation 07/21/15   PT Start Time 1100   PT Stop Time 1140   PT Time Calculation (min) 40 min   Activity Tolerance Patient tolerated treatment well   Behavior During Therapy [facility] for tasks assessed/performed      Past Medical History  Diagnosis Date  . Allergic rhinitis due to pollen   . Osteoarthrosis, unspecified whether generalized or localized, lower leg   . Obesity, unspecified   . Unspecified essential hypertension   . Anemia   . Diabetes mellitus without complication ([place])   . 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 Vitamin B12 deficiency disease 10/28/2008    Qualifier: Diagnosis of  By: Moshe Cipro MD, Joycelyn Schmid      Past Surgical History  Procedure Laterality Date  . Gastric bypass  2004  . Colonoscopy  2008  . Total knee arthroplasty Right 02/23/2015    Procedure: RIGHT TOTAL KNEE ARTHROPLASTY;  Surgeon: Carole Civil, MD;  Location: [facility];  Service: Orthopedics;  Laterality: Right;  . Exam under anesthesia with manipulation of knee Right 05/11/2015    Procedure: EXAM UNDER ANESTHESIA WITH MANIPULATION OF KNEE;  Surgeon: Carole Civil, MD;  Location: [facility];  Service: Orthopedics;  Laterality: Right;    There were no vitals filed for this visit.  Visit Diagnosis:  Knee stiffness, right  Difficulty walking  Swelling of limb  Right knee pain  Decreased range of motion (ROM) of knee      Subjective Assessment - 07/14/15 1103    Subjective "Everything is good"   Currently in Pain? No/denies   Pain Score 0-No pain                          [facility] Adult PT Treatment/Exercise - 07/14/15 0001    Ambulation/Gait   Stairs Yes   Stairs Assistance 6: Modified independent (Device/Increase time)   Stair Management Technique One rail Right;One rail Left   Number of Stairs 24   Height of Stairs 6   Gait Comments x3,  R hip hike inorder to clear RLE mod tactile cues to correct.   Knee/Hip Exercises: Aerobic   Stationary Bike 6 min R hip hike to make full revolutions   Elliptical I-10 R-1   Knee/Hip Exercises: Machines for Strengthening   Cybex Leg Press 40# 2x15; RLE only #20 2x5   Knee/Hip Exercises: Standing   Other Standing Knee Exercises resisted walking #30 4 way x4                  PT Short Term Goals - 07/04/15 1311    PT SHORT TERM GOAL #1   Title Pt will be independent with initial HEP   Status Achieved           PT Long Term Goals - 07/14/15 1106    PT LONG TERM GOAL #1   Title Pt will increase right knee AROM to 5-110.   Status Not Met   PT LONG TERM GOAL #2   Title Pt will walk 500 feet with minimal  deviation.   Status Achieved   PT LONG TERM GOAL #3   Title Pt will report no more than 2/10 pain with active knee flexion.   Status Achieved   PT LONG TERM GOAL #4   Title increase AROM to 10-110 degrees flexion   Status Not Met   PT LONG TERM GOAL #5   Title decrease pain 50%   Status Achieved               Plan - 07/14/15 1139    Clinical Impression Statement Pt remain limited in R knee ROM but has completed some of her goals. Pt is pleased with her current functional status and reports no limitations.   PT Next Visit Plan D/C PT         Problem List Patient Active Problem List   Diagnosis Date Noted  . Medicare annual wellness visit, subsequent 06/28/2015  . 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 Primary osteoarthritis of right knee   . Knee pain, right 11/29/2014  . Excess skin of arm 11/29/2014  . Osteopenia  08/12/2013  . 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 Prediabetes 08/07/2011  . Vitamin D deficiency 08/07/2011  . Abnormal facial hair 03/29/2011  . PERSONAL HX COLONIC POLYPS 07/05/2010  . KNEE, ARTHRITIS, DEGEN./OSTEO 01/16/2010  . Vitamin B12 deficiency disease 10/28/2008  . 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 Obesity 08/15/2007  . Essential hypertension 08/15/2007    PHYSICAL THERAPY DISCHARGE SUMMARY  Visits from Start of Care: 15  Plan: Patient agrees to discharge.  Patient goals were not met. Patient is being discharged due to being pleased with the current functional level.  ?????       Scot Jun, PTA  07/14/2015, 11:41 AM  [place] [address] [place], [place], [address] Phone: [phone]   Fax:  [phone]  Name: Sheri Jones MRN: [id number] Date of Birth: 18-Nov-1945

## 2015-07-18 ENCOUNTER — Encounter (HOSPITAL_COMMUNITY): Payer: Federal, State, Local not specified - PPO | Attending: Oncology

## 2015-07-18 VITALS — BP 112/69 | HR 78 | Temp 98.6°F | Resp 18

## 2015-07-18 DIAGNOSIS — E538 Deficiency of other specified B group vitamins: Secondary | ICD-10-CM | POA: Insufficient documentation

## 2015-07-18 MED ORDER — CYANOCOBALAMIN 1000 MCG/ML IJ SOLN
1000.0000 ug | Freq: Once | INTRAMUSCULAR | Status: AC
  Administered 2015-07-18: 1000 ug via INTRAMUSCULAR
  Filled 2015-07-18: qty 1

## 2015-07-18 NOTE — Patient Instructions (Signed)
[facility] at [facility] Discharge Instructions  RECOMMENDATIONS MADE BY THE CONSULTANT AND ANY TEST RESULTS WILL BE SENT TO YOUR REFERRING PHYSICIAN.  Vitamin B12 1000 mcg injection given as ordered.  Thank you for choosing [facility] at [facility] to provide your oncology and hematology care.  To afford each patient quality time with our provider, please arrive at least 15 minutes before your scheduled appointment time.   Beginning January 23rd 2017 lab work for the Cancer Center will be done in the  Main lab at [place] on 1st floor. If you have a lab appointment with the Cancer Center please come in thru the  Main Entrance and check in at the main information desk  You need to re-schedule your appointment should you arrive 10 or more minutes late.  We strive to give you quality time with our providers, and arriving late affects you and other patients whose appointments are after yours.  Also, if you no show three or more times for appointments you may be dismissed from the clinic at the providers discretion.     Again, thank you for choosing [facility].  Our hope is that these requests will decrease the amount of time that you wait before being seen by our physicians.       _____________________________________________________________  Should you have questions after your visit to  Cancer Center, please contact our office at [phone] between the hours of 8:30 a.m. and 4:30 p.m.  Voicemails left after 4:30 p.m. will not be returned until the following business day.  For prescription refill requests, have your pharmacy contact our office.         Resources For Cancer Patients and their Caregivers ? American Cancer Society: Can assist with transportation, wigs, general needs, runs Look Good Feel Better.        1-888-227-6333 ? Cancer Care: Provides financial assistance, online support groups,  medication/co-pay assistance.  1-800-813-HOPE (4673) ? [facility] Assists [place] cancer patients and their families through emotional , educational and financial support.  336-427-4357 ? [place] DSS Where to apply for food stamps, Medicaid and utility assistance. 336-342-1394 ? [employer]: Transportation to medical appointments. 336-347-2287 ? Social Security Administration: May apply for disability if have a Stage IV cancer. 336-342-7796 1-800-772-1213 ? [place] Aging, Disability and Transit Services: Assists with nutrition, care and transit needs. 336-349-2343 

## 2015-07-18 NOTE — Progress Notes (Signed)
Sheri Jones presents today for injection per MD orders. B12 1000 mcg administered IM in left upper outer Gluteal. Administration without incident. Patient tolerated well.

## 2015-07-23 ENCOUNTER — Other Ambulatory Visit: Payer: Self-pay | Admitting: Family Medicine

## 2015-07-25 ENCOUNTER — Ambulatory Visit (INDEPENDENT_AMBULATORY_CARE_PROVIDER_SITE_OTHER): Payer: Self-pay | Admitting: Orthopedic Surgery

## 2015-07-25 VITALS — BP 124/79 | Ht 63.0 in | Wt 174.0 lb

## 2015-07-25 DIAGNOSIS — M24661 Ankylosis, right knee: Secondary | ICD-10-CM

## 2015-07-25 DIAGNOSIS — Z96651 Presence of right artificial knee joint: Secondary | ICD-10-CM

## 2015-07-25 DIAGNOSIS — Z4789 Encounter for other orthopedic aftercare: Secondary | ICD-10-CM

## 2015-07-25 NOTE — Progress Notes (Signed)
Patient ID: Sheri Jones, female   DOB: [birth year], 70 y.o.   MRN: [id number]  Chief Complaint  Patient presents with  . Follow-up    1 month recheck on right knee, DOS 05-11-15, MUA.    HPI TKA IN NOV THEN MUA IN JAN , Pain-free walking 20-30 minutes a day  ROS  BP 124/79 mmHg  Ht 5\' 3"  (1.6 m)  Wt 174 lb (78.926 kg)  BMI 30.83 kg/m2  Physical Exam  Ortho Exam  0-90 Ambulates without assistive device    ASSESSMENT AND PLAN   Continue exercises follow-up in 3 months

## 2015-08-18 ENCOUNTER — Encounter (HOSPITAL_COMMUNITY): Payer: Federal, State, Local not specified - PPO | Attending: Oncology

## 2015-08-18 VITALS — BP 116/69 | HR 78 | Temp 97.5°F | Resp 16 | Ht 63.0 in | Wt 174.0 lb

## 2015-08-18 DIAGNOSIS — E538 Deficiency of other specified B group vitamins: Secondary | ICD-10-CM | POA: Diagnosis not present

## 2015-08-18 MED ORDER — CYANOCOBALAMIN 1000 MCG/ML IJ SOLN
1000.0000 ug | Freq: Once | INTRAMUSCULAR | Status: AC
  Administered 2015-08-18: 1000 ug via INTRAMUSCULAR
  Filled 2015-08-18: qty 1

## 2015-08-18 NOTE — Progress Notes (Signed)
Sheri Jones presents today for injection per MD orders. B12 1000mcg administered SQ in left Gluteal. Administration without incident. Patient tolerated well.  

## 2015-08-22 ENCOUNTER — Other Ambulatory Visit: Payer: Self-pay | Admitting: Family Medicine

## 2015-09-21 ENCOUNTER — Encounter (HOSPITAL_COMMUNITY): Payer: Federal, State, Local not specified - PPO | Attending: Oncology

## 2015-09-21 ENCOUNTER — Encounter (HOSPITAL_COMMUNITY): Payer: Self-pay

## 2015-09-21 VITALS — BP 115/71 | HR 75 | Temp 98.0°F | Resp 18

## 2015-09-21 DIAGNOSIS — E538 Deficiency of other specified B group vitamins: Secondary | ICD-10-CM

## 2015-09-21 MED ORDER — CYANOCOBALAMIN 1000 MCG/ML IJ SOLN
1000.0000 ug | Freq: Once | INTRAMUSCULAR | Status: AC
Start: 2015-09-21 — End: 2015-09-21
  Administered 2015-09-21: 1000 ug via INTRAMUSCULAR
  Filled 2015-09-21: qty 1

## 2015-09-21 NOTE — Progress Notes (Signed)
Debborah S Richison presents today for injection per the provider's orders.  B12 administration without incident; see MAR for injection details.  Patient tolerated procedure well and without incident.  No questions or complaints noted at this time. 

## 2015-09-21 NOTE — Patient Instructions (Signed)
[facility] at [facility] Discharge Instructions  RECOMMENDATIONS MADE BY THE CONSULTANT AND ANY TEST RESULTS WILL BE SENT TO YOUR REFERRING PHYSICIAN.  B12 injection today. Return as scheduled for injections. Return as scheduled for lab work and office visit.  Thank you for choosing [facility] at [facility] to provide your oncology and hematology care.  To afford each patient quality time with our provider, please arrive at least 15 minutes before your scheduled appointment time.   Beginning January 23rd 2017 lab work for the Cancer Center will be done in the  Main lab at [place] on 1st floor. If you have a lab appointment with the Cancer Center please come in thru the  Main Entrance and check in at the main information desk  You need to re-schedule your appointment should you arrive 10 or more minutes late.  We strive to give you quality time with our providers, and arriving late affects you and other patients whose appointments are after yours.  Also, if you no show three or more times for appointments you may be dismissed from the clinic at the providers discretion.     Again, thank you for choosing [facility].  Our hope is that these requests will decrease the amount of time that you wait before being seen by our physicians.       _____________________________________________________________  Should you have questions after your visit to  Cancer Center, please contact our office at [phone] between the hours of 8:30 a.m. and 4:30 p.m.  Voicemails left after 4:30 p.m. will not be returned until the following business day.  For prescription refill requests, have your pharmacy contact our office.         Resources For Cancer Patients and their Caregivers ? American Cancer Society: Can assist with transportation, wigs, general needs, runs Look Good Feel Better.        1-888-227-6333 ? Cancer  Care: Provides financial assistance, online support groups, medication/co-pay assistance.  1-800-813-HOPE (4673) ? [facility] Assists [place] cancer patients and their families through emotional , educational and financial support.  336-427-4357 ? [place] DSS Where to apply for food stamps, Medicaid and utility assistance. 336-342-1394 ? [employer]: Transportation to medical appointments. 336-347-2287 ? Social Security Administration: May apply for disability if have a Stage IV cancer. 336-342-7796 1-800-772-1213 ? [place] Aging, Disability and Transit Services: Assists with nutrition, care and transit needs. 336-349-2343  Cancer Center Support Programs: [email or web address]@ > Cancer Support Group  2nd Tuesday of the month 1pm-2pm, Journey Room  > Creative Journey  3rd Tuesday of the month 1130am-1pm, Journey Room  > Look Good Feel Better  1st Wednesday of the month 10am-12 noon, Journey Room (Call American Cancer Society to register 1-800-395-5775)   

## 2015-09-26 ENCOUNTER — Ambulatory Visit (HOSPITAL_COMMUNITY): Payer: Federal, State, Local not specified - PPO

## 2015-09-30 ENCOUNTER — Other Ambulatory Visit: Payer: Self-pay | Admitting: Family Medicine

## 2015-10-20 ENCOUNTER — Encounter: Payer: Self-pay | Admitting: Orthopedic Surgery

## 2015-10-20 ENCOUNTER — Ambulatory Visit (INDEPENDENT_AMBULATORY_CARE_PROVIDER_SITE_OTHER): Payer: Federal, State, Local not specified - PPO | Admitting: Orthopedic Surgery

## 2015-10-20 VITALS — BP 119/73 | Ht 63.0 in | Wt 173.0 lb

## 2015-10-20 DIAGNOSIS — M24661 Ankylosis, right knee: Secondary | ICD-10-CM

## 2015-10-20 NOTE — Progress Notes (Signed)
Patient ID: Sheri Jones, female   DOB: [birth year], 70 y.o.   MRN: [id number]  Chief Complaint  Patient presents with  . Follow-up    POST OP RT KNEE MUA, DOS 05/11/15   Index surgery was November 8264   HPI 70 year old female had right total knee operative by stiffness have manipulation under anesthesia  ROS   Complains of stiffness but she says she is walking well and she doesn't use a cane and she is not limping    BP 119/73 mmHg  Ht 5\' 3"  (1.6 m)  Wt 173 lb (78.472 kg)  BMI 30.65 kg/m2 Gen. appearance is normal grooming and hygiene Orientation to person place and time normal Mood normal Gait is normal  No peripheral edema or swelling is noted in the Right or left leg Sensory exam shows normal sensation to palpation, pressure and soft touch Skin exam no lacerations ulcerations or erythema  Ortho Exam Right knee 0-90 flexion stable A/P  Medical decision-making  Follow-up for annual x-ray in November  [name], MD 10/20/2015 3:14 PM

## 2015-10-24 ENCOUNTER — Encounter (HOSPITAL_COMMUNITY): Payer: Federal, State, Local not specified - PPO | Attending: Oncology

## 2015-10-24 ENCOUNTER — Telehealth (HOSPITAL_COMMUNITY): Payer: Self-pay | Admitting: *Deleted

## 2015-10-24 VITALS — BP 107/67 | HR 74 | Temp 98.0°F | Resp 20 | Wt 173.4 lb

## 2015-10-24 DIAGNOSIS — E538 Deficiency of other specified B group vitamins: Secondary | ICD-10-CM | POA: Insufficient documentation

## 2015-10-24 MED ORDER — CYANOCOBALAMIN 1000 MCG/ML IJ SOLN
1000.0000 ug | Freq: Once | INTRAMUSCULAR | Status: AC
  Administered 2015-10-24: 1000 ug via INTRAMUSCULAR

## 2015-10-24 MED ORDER — CYANOCOBALAMIN 1000 MCG/ML IJ SOLN
INTRAMUSCULAR | Status: AC
  Filled 2015-10-24: qty 1

## 2015-10-24 NOTE — Progress Notes (Signed)
Sheri Jones presents today for injection per MD orders. B12 1075mcg administered IM in left Gluteal. Administration without incident. Patient tolerated well.

## 2015-11-15 ENCOUNTER — Other Ambulatory Visit: Payer: Self-pay | Admitting: Family Medicine

## 2015-11-15 DIAGNOSIS — Z1231 Encounter for screening mammogram for malignant neoplasm of breast: Secondary | ICD-10-CM

## 2015-11-24 ENCOUNTER — Encounter (HOSPITAL_COMMUNITY): Payer: Federal, State, Local not specified - PPO | Attending: Oncology

## 2015-11-24 VITALS — BP 127/70 | HR 64 | Temp 98.2°F | Resp 20

## 2015-11-24 DIAGNOSIS — E538 Deficiency of other specified B group vitamins: Secondary | ICD-10-CM | POA: Diagnosis not present

## 2015-11-24 MED ORDER — CYANOCOBALAMIN 1000 MCG/ML IJ SOLN
INTRAMUSCULAR | Status: AC
  Filled 2015-11-24: qty 1

## 2015-11-24 MED ORDER — CYANOCOBALAMIN 1000 MCG/ML IJ SOLN
1000.0000 ug | Freq: Once | INTRAMUSCULAR | Status: AC
  Administered 2015-11-24: 1000 ug via INTRAMUSCULAR

## 2015-11-24 NOTE — Patient Instructions (Signed)
[facility] at [facility] Discharge Instructions  RECOMMENDATIONS MADE BY THE CONSULTANT AND ANY TEST RESULTS WILL BE SENT TO YOUR REFERRING PHYSICIAN.  Vitamin B12 1000 mcg injection given as ordered. Return as scheduled.  Thank you for choosing [facility] at [facility] to provide your oncology and hematology care.  To afford each patient quality time with our provider, please arrive at least 15 minutes before your scheduled appointment time.   Beginning January 23rd 2017 lab work for the Cancer Center will be done in the  Main lab at [place] on 1st floor. If you have a lab appointment with the Cancer Center please come in thru the  Main Entrance and check in at the main information desk  You need to re-schedule your appointment should you arrive 10 or more minutes late.  We strive to give you quality time with our providers, and arriving late affects you and other patients whose appointments are after yours.  Also, if you no show three or more times for appointments you may be dismissed from the clinic at the providers discretion.     Again, thank you for choosing [facility].  Our hope is that these requests will decrease the amount of time that you wait before being seen by our physicians.       _____________________________________________________________  Should you have questions after your visit to [facility], please contact our office at [phone] between the hours of 8:30 a.m. and 4:30 p.m.  Voicemails left after 4:30 p.m. will not be returned until the following business day.  For prescription refill requests, have your pharmacy contact our office.         Resources For Cancer Patients and their Caregivers ? American Cancer Society: Can assist with transportation, wigs, general needs, runs Look Good Feel Better.        1-888-227-6333 ? Cancer Care: Provides financial assistance, online support  groups, medication/co-pay assistance.  1-800-813-HOPE (4673) ? [facility] Assists [place] cancer patients and their families through emotional , educational and financial support.  336-427-4357 ? [place] DSS Where to apply for food stamps, Medicaid and utility assistance. 336-342-1394 ? [employer]: Transportation to medical appointments. 336-347-2287 ? Social Security Administration: May apply for disability if have a Stage IV cancer. 336-342-7796 1-800-772-1213 ? [place] Aging, Disability and Transit Services: Assists with nutrition, care and transit needs. 336-349-2343  Cancer Center Support Programs: [email or web address]@ > Cancer Support Group  2nd Tuesday of the month 1pm-2pm, Journey Room  > Creative Journey  3rd Tuesday of the month 1130am-1pm, Journey Room  > Look Good Feel Better  1st Wednesday of the month 10am-12 noon, Journey Room (Call American Cancer Society to register 1-800-395-5775)   

## 2015-11-24 NOTE — Progress Notes (Signed)
Sheri Jones presents today for injection per MD orders. B12 1000 mcg administered IM in left upper outer Gluteal. Administration without incident. Patient tolerated well.

## 2015-11-29 ENCOUNTER — Encounter: Payer: Self-pay | Admitting: Family Medicine

## 2015-11-29 ENCOUNTER — Ambulatory Visit (INDEPENDENT_AMBULATORY_CARE_PROVIDER_SITE_OTHER): Payer: Federal, State, Local not specified - PPO | Admitting: Family Medicine

## 2015-11-29 VITALS — BP 118/74 | HR 75 | Resp 16 | Ht 63.0 in | Wt 175.0 lb

## 2015-11-29 DIAGNOSIS — I10 Essential (primary) hypertension: Secondary | ICD-10-CM | POA: Diagnosis not present

## 2015-11-29 DIAGNOSIS — E669 Obesity, unspecified: Secondary | ICD-10-CM

## 2015-11-29 DIAGNOSIS — R7303 Prediabetes: Secondary | ICD-10-CM | POA: Diagnosis not present

## 2015-11-29 DIAGNOSIS — E559 Vitamin D deficiency, unspecified: Secondary | ICD-10-CM

## 2015-11-29 DIAGNOSIS — M25561 Pain in right knee: Secondary | ICD-10-CM | POA: Diagnosis not present

## 2015-11-29 DIAGNOSIS — E538 Deficiency of other specified B group vitamins: Secondary | ICD-10-CM

## 2015-11-29 LAB — BASIC METABOLIC PANEL WITH GFR
BUN: 16 mg/dL (ref 7–25)
CALCIUM: 9.1 mg/dL (ref 8.6–10.4)
CHLORIDE: 106 mmol/L (ref 98–110)
CO2: 26 mmol/L (ref 20–31)
CREATININE: 0.99 mg/dL (ref 0.50–0.99)
GFR, EST AFRICAN AMERICAN: 68 mL/min (ref >=60)
GFR, Est Non African American: 59 mL/min — ABNORMAL LOW (ref >=60)
Glucose, Bld: 83 mg/dL (ref 65–99)
Potassium: 4.2 mmol/L (ref 3.5–5.3)
SODIUM: 140 mmol/L (ref 135–146)

## 2015-11-29 MED ORDER — HYDROCHLOROTHIAZIDE 25 MG PO TABS: 25.0000 mg | ORAL_TABLET | Freq: Every day | ORAL | 1 refills | Status: DC

## 2015-11-29 MED ORDER — VITAMIN D (ERGOCALCIFEROL) 1.25 MG (50000 UNIT) PO CAPS: 50000.0000 [IU] | ORAL_CAPSULE | ORAL | 1 refills | Status: DC

## 2015-11-29 NOTE — Patient Instructions (Signed)
Annual physical exam in 4 month, call if you need me sooner  HBA1C, chem 7 and EGFR today  Please work on good  health habits so that your health will improve. 1. Commitment to daily physical activity for 30 to 60  minutes, if you are able to do this.  2. Commitment to wise food choices. Aim for half of your  food intake to be vegetable and fruit, one quarter starchy foods, and one quarter protein. Try to eat on a regular schedule  3 meals per day, snacking between meals should be limited to vegetables or fruits or small portions of nuts. 64 ounces of water per day is generally recommended, unless you have specific health conditions, like heart failure or kidney failure where you will need to limit fluid intake.  3. Commitment to sufficient and a  good quality of physical and mental rest daily, generally between 6 to 8 hours per day.  WITH PERSISTANCE AND PERSEVERANCE, THE IMPOSSIBLE , BECOMES THE NORM! Thank you  for choosing [facility]. We consider it a privelige to serve you.  Delivering excellent health care in a caring and  compassionate way is our goal.  Partnering with you,  so that together we can achieve this goal is our strategy.

## 2015-11-30 ENCOUNTER — Other Ambulatory Visit: Payer: Self-pay

## 2015-11-30 ENCOUNTER — Other Ambulatory Visit: Payer: Self-pay | Admitting: Family Medicine

## 2015-11-30 LAB — HEMOGLOBIN A1C
HEMOGLOBIN A1C: 5.2 % (ref <5.7)
MEAN PLASMA GLUCOSE: 103 mg/dL

## 2015-12-04 NOTE — Progress Notes (Signed)
   Sheri Jones     MRN: [id number]      DOB: 1945/05/17   HPI Sheri Jones is here for follow up and re-evaluation of chronic medical conditions, medication management and review of any available recent lab and radiology data.  Preventive health is updated, specifically  Cancer screening and Immunization.   Questions or concerns regarding consultations or procedures which the PT has had in the interim are  addressed. The PT denies any adverse reactions to current medications since the last visit.  C/o incrreased knee pain x 1 month, no recent trauma  ROS Denies recent fever or chills. Denies sinus pressure, nasal congestion, ear pain or sore throat. Denies chest congestion, productive cough or wheezing. Denies chest pains, palpitations and leg swelling Denies abdominal pain, nausea, vomiting,diarrhea or constipation.   Denies dysuria, frequency, hesitancy or incontinence.  Denies headaches, seizures, numbness, or tingling. Denies depression, anxiety or insomnia. Denies skin break down or rash.   PE  BP 118/74   Pulse 75   Resp 16   Ht 5\' 3"  (1.6 m)   Wt 175 lb (79.4 kg)   SpO2 98%   BMI 31.00 kg/m   Patient alert and oriented and in no cardiopulmonary distress.  HEENT: No facial asymmetry, EOMI,   oropharynx pink and moist.  Neck supple no JVD, no mass.  Chest: Clear to auscultation bilaterally.  CVS: S1, S2 no murmurs, no S3.Regular rate.  ABD: Soft non tender.   Ext: No edema  MS: Adequate ROM spine, shoulders, hips and reduced in  knees.  Skin: Intact, no ulcerations or rash noted.  Psych: Good eye contact, normal affect. Memory intact not anxious or depressed appearing.  CNS: CN 2-12 intact, power,  normal throughout.no focal deficits noted.   Assessment & Plan Essential hypertension Controlled, no change in medication DASH diet and commitment to daily physical activity for a minimum of 30 minutes discussed and encouraged, as a part of hypertension  management. The importance of attaining a healthy weight is also discussed.  BP/Weight 11/29/2015 11/24/2015 10/24/2015 10/20/2015 09/21/2015 [id number]  Systolic BP 123456 [id number] 123456 [id number]  Diastolic BP 74 70 67 73 71 69 79  Wt. (Lbs) 175 - 173.4 173 - 174 174  BMI 31 - 30.72 30.65 - 30.83 30.83       Knee pain, right Increased and uncontrolled, intends to commit to regular exercise and weight loss  Obesity Deteriorated. Patient re-educated about  the importance of commitment to a  minimum of 150 minutes of exercise per week.  The importance of healthy food choices with portion control discussed. Encouraged to start a food diary, count calories and to consider  joining a support group. Sample diet sheets offered. Goals set by the patient for the next several months.   Weight /BMI 11/29/2015 10/24/2015 10/20/2015  WEIGHT 175 lb 173 lb 6.4 oz 173 lb  HEIGHT 5\' 3"  - 5\' 3"   BMI 31 kg/m2 30.72 kg/m2 30.65 kg/m2      Vitamin B12 deficiency disease Receives monthly injections at hematology  Vitamin D deficiency Updated lab needed at/ before next visit.

## 2015-12-04 NOTE — Assessment & Plan Note (Signed)
Deteriorated. Patient re-educated about  the importance of commitment to a  minimum of 150 minutes of exercise per week.  The importance of healthy food choices with portion control discussed. Encouraged to start a food diary, count calories and to consider  joining a support group. Sample diet sheets offered. Goals set by the patient for the next several months.   Weight /BMI 11/29/2015 10/24/2015 10/20/2015  WEIGHT 175 lb 173 lb 6.4 oz 173 lb  HEIGHT 5\' 3"  - 5\' 3"   BMI 31 kg/m2 30.72 kg/m2 30.65 kg/m2

## 2015-12-04 NOTE — Assessment & Plan Note (Signed)
Receives monthly injections at hematology

## 2015-12-04 NOTE — Assessment & Plan Note (Signed)
Increased and uncontrolled, intends to commit to regular exercise and weight loss

## 2015-12-04 NOTE — Assessment & Plan Note (Signed)
Controlled, no change in medication DASH diet and commitment to daily physical activity for a minimum of 30 minutes discussed and encouraged, as a part of hypertension management. The importance of attaining a healthy weight is also discussed.  BP/Weight 11/29/2015 11/24/2015 10/24/2015 10/20/2015 09/21/2015 [id number]  Systolic BP 123456 [id number] 123456 [id number]  Diastolic BP 74 70 67 73 71 69 79  Wt. (Lbs) 175 - 173.4 173 - 174 174  BMI 31 - 30.72 30.65 - 30.83 30.83

## 2015-12-04 NOTE — Assessment & Plan Note (Signed)
Updated lab needed at/ before next visit.   

## 2015-12-05 ENCOUNTER — Ambulatory Visit (HOSPITAL_COMMUNITY)
Admission: RE | Admit: 2015-12-05 | Discharge: 2015-12-05 | Disposition: A | Discharge status: Home / Self Care | Payer: Federal, State, Local not specified - PPO | Source: Ambulatory Visit | Attending: Family Medicine | Admitting: Family Medicine

## 2015-12-05 DIAGNOSIS — Z1231 Encounter for screening mammogram for malignant neoplasm of breast: Secondary | ICD-10-CM | POA: Diagnosis present

## 2015-12-12 ENCOUNTER — Other Ambulatory Visit: Payer: Self-pay | Admitting: Family Medicine

## 2015-12-12 DIAGNOSIS — R928 Other abnormal and inconclusive findings on diagnostic imaging of breast: Secondary | ICD-10-CM

## 2015-12-20 ENCOUNTER — Encounter: Payer: Self-pay | Admitting: Family Medicine

## 2015-12-27 ENCOUNTER — Ambulatory Visit (HOSPITAL_COMMUNITY)
Admission: RE | Admit: 2015-12-27 | Discharge: 2015-12-27 | Disposition: A | Discharge status: Home / Self Care | Payer: Federal, State, Local not specified - PPO | Source: Ambulatory Visit | Attending: Family Medicine | Admitting: Family Medicine

## 2015-12-27 ENCOUNTER — Encounter (HOSPITAL_COMMUNITY): Payer: Federal, State, Local not specified - PPO | Attending: Oncology

## 2015-12-27 ENCOUNTER — Encounter (HOSPITAL_COMMUNITY): Payer: Self-pay

## 2015-12-27 ENCOUNTER — Ambulatory Visit (HOSPITAL_COMMUNITY): Payer: Federal, State, Local not specified - PPO

## 2015-12-27 VITALS — BP 114/70 | HR 70 | Temp 98.6°F | Resp 20

## 2015-12-27 DIAGNOSIS — N6002 Solitary cyst of left breast: Secondary | ICD-10-CM | POA: Insufficient documentation

## 2015-12-27 DIAGNOSIS — R928 Other abnormal and inconclusive findings on diagnostic imaging of breast: Secondary | ICD-10-CM

## 2015-12-27 DIAGNOSIS — E538 Deficiency of other specified B group vitamins: Secondary | ICD-10-CM | POA: Diagnosis not present

## 2015-12-27 MED ORDER — CYANOCOBALAMIN 1000 MCG/ML IJ SOLN
INTRAMUSCULAR | Status: AC
  Filled 2015-12-27: qty 1

## 2015-12-27 MED ORDER — CYANOCOBALAMIN 1000 MCG/ML IJ SOLN
1000.0000 ug | Freq: Once | INTRAMUSCULAR | Status: AC
  Administered 2015-12-27: 1000 ug via INTRAMUSCULAR

## 2015-12-27 NOTE — Progress Notes (Signed)
Pt given B12 injection in right upper outer buttock. Pt tolerated well. Pt stable and discharged home ambulatory.

## 2015-12-27 NOTE — Patient Instructions (Signed)
[facility] at [facility] Discharge Instructions  RECOMMENDATIONS MADE BY THE CONSULTANT AND ANY TEST RESULTS WILL BE SENT TO YOUR REFERRING PHYSICIAN.   You were given a B12 injection today. Return as scheduled.   Thank you for choosing  [facility] at [facility] to provide your oncology and hematology care.  To afford each patient quality time with our provider, please arrive at least 15 minutes before your scheduled appointment time.   Beginning January 23rd 2017 lab work for the Cancer Center will be done in the  Main lab at [place] on 1st floor. If you have a lab appointment with the Cancer Center please come in thru the  Main Entrance and check in at the main information desk  You need to re-schedule your appointment should you arrive 10 or more minutes late.  We strive to give you quality time with our providers, and arriving late affects you and other patients whose appointments are after yours.  Also, if you no show three or more times for appointments you may be dismissed from the clinic at the providers discretion.     Again, thank you for choosing [facility].  Our hope is that these requests will decrease the amount of time that you wait before being seen by our physicians.       _____________________________________________________________  Should you have questions after your visit to [facility], please contact our office at [phone] between the hours of 8:30 a.m. and 4:30 p.m.  Voicemails left after 4:30 p.m. will not be returned until the following business day.  For prescription refill requests, have your pharmacy contact our office.         Resources For Cancer Patients and their Caregivers ? American Cancer Society: Can assist with transportation, wigs, general needs, runs Look Good Feel Better.        1-888-227-6333 ? Cancer Care: Provides financial assistance, online support groups,  medication/co-pay assistance.  1-800-813-HOPE (4673) ? [facility] Assists [place] cancer patients and their families through emotional , educational and financial support.  336-427-4357 ? [place] DSS Where to apply for food stamps, Medicaid and utility assistance. 336-342-1394 ? [employer]: Transportation to medical appointments. 336-347-2287 ? Social Security Administration: May apply for disability if have a Stage IV cancer. 336-342-7796 1-800-772-1213 ? [place] Aging, Disability and Transit Services: Assists with nutrition, care and transit needs. 336-349-2343  Cancer Center Support Programs: @[email or web address]@ > Cancer Support Group  2nd Tuesday of the month 1pm-2pm, Journey Room  > Creative Journey  3rd Tuesday of the month 1130am-1pm, Journey Room  > Look Good Feel Better  1st Wednesday of the month 10am-12 noon, Journey Room (Call American Cancer Society to register 1-800-395-5775)   

## 2016-01-05 ENCOUNTER — Ambulatory Visit (INDEPENDENT_AMBULATORY_CARE_PROVIDER_SITE_OTHER): Payer: Federal, State, Local not specified - PPO

## 2016-01-05 DIAGNOSIS — Z23 Encounter for immunization: Secondary | ICD-10-CM | POA: Diagnosis not present

## 2016-01-17 ENCOUNTER — Other Ambulatory Visit: Payer: Self-pay | Admitting: Family Medicine

## 2016-01-26 ENCOUNTER — Encounter (HOSPITAL_COMMUNITY): Payer: Federal, State, Local not specified - PPO | Attending: Oncology

## 2016-01-26 VITALS — BP 133/75 | HR 65 | Temp 98.1°F | Resp 20

## 2016-01-26 DIAGNOSIS — E538 Deficiency of other specified B group vitamins: Secondary | ICD-10-CM | POA: Diagnosis not present

## 2016-01-26 MED ORDER — CYANOCOBALAMIN 1000 MCG/ML IJ SOLN
INTRAMUSCULAR | Status: AC
  Filled 2016-01-26: qty 1

## 2016-01-26 MED ORDER — CYANOCOBALAMIN 1000 MCG/ML IJ SOLN
1000.0000 ug | Freq: Once | INTRAMUSCULAR | Status: AC
  Administered 2016-01-26: 1000 ug via INTRAMUSCULAR

## 2016-01-26 NOTE — Progress Notes (Signed)
[name] tolerated Vit B12 injection well without complaints or incident. Pt discharged self ambulatory in satisfactory condition

## 2016-01-26 NOTE — Patient Instructions (Signed)
[facility] at [facility] Discharge Instructions  RECOMMENDATIONS MADE BY THE CONSULTANT AND ANY TEST RESULTS WILL BE SENT TO YOUR REFERRING PHYSICIAN.  Received Vit B12 injection today. Follow-up as scheduled. Call clinic for any questions or concerns  Thank you for choosing [facility] at [facility] to provide your oncology and hematology care.  To afford each patient quality time with our provider, please arrive at least 15 minutes before your scheduled appointment time.   Beginning January 23rd 2017 lab work for the Cancer Center will be done in the  Main lab at [place] on 1st floor. If you have a lab appointment with the Cancer Center please come in thru the  Main Entrance and check in at the main information desk  You need to re-schedule your appointment should you arrive 10 or more minutes late.  We strive to give you quality time with our providers, and arriving late affects you and other patients whose appointments are after yours.  Also, if you no show three or more times for appointments you may be dismissed from the clinic at the providers discretion.     Again, thank you for choosing  Cancer Center.  Our hope is that these requests will decrease the amount of time that you wait before being seen by our physicians.       _____________________________________________________________  Should you have questions after your visit to  Cancer Center, please contact our office at [phone] between the hours of 8:30 a.m. and 4:30 p.m.  Voicemails left after 4:30 p.m. will not be returned until the following business day.  For prescription refill requests, have your pharmacy contact our office.         Resources For Cancer Patients and their Caregivers ? American Cancer Society: Can assist with transportation, wigs, general needs, runs Look Good Feel Better.        1-888-227-6333 ? Cancer Care: Provides  financial assistance, online support groups, medication/co-pay assistance.  1-800-813-HOPE (4673) ? [facility] Assists [place] cancer patients and their families through emotional , educational and financial support.  336-427-4357 ? [place] DSS Where to apply for food stamps, Medicaid and utility assistance. 336-342-1394 ? [employer]: Transportation to medical appointments. 336-347-2287 ? Social Security Administration: May apply for disability if have a Stage IV cancer. 336-342-7796 1-800-772-1213 ? [place] Aging, Disability and Transit Services: Assists with nutrition, care and transit needs. 336-349-2343  Cancer Center Support Programs: @[email or web address]@ > Cancer Support Group  2nd Tuesday of the month 1pm-2pm, Journey Room  > Creative Journey  3rd Tuesday of the month 1130am-1pm, Journey Room  > Look Good Feel Better  1st Wednesday of the month 10am-12 noon, Journey Room (Call American Cancer Society to register 1-800-395-5775)   

## 2016-02-05 ENCOUNTER — Other Ambulatory Visit: Payer: Self-pay | Admitting: Family Medicine

## 2016-02-27 ENCOUNTER — Encounter (HOSPITAL_COMMUNITY): Payer: Medicare Other | Attending: Hematology & Oncology

## 2016-02-27 VITALS — BP 122/60 | HR 65 | Temp 98.4°F | Resp 18

## 2016-02-27 DIAGNOSIS — E538 Deficiency of other specified B group vitamins: Secondary | ICD-10-CM | POA: Diagnosis not present

## 2016-02-27 MED ORDER — CYANOCOBALAMIN 1000 MCG/ML IJ SOLN
1000.0000 ug | Freq: Once | INTRAMUSCULAR | Status: AC
  Administered 2016-02-27: 1000 ug via INTRAMUSCULAR

## 2016-02-27 MED ORDER — CYANOCOBALAMIN 1000 MCG/ML IJ SOLN
INTRAMUSCULAR | Status: AC
  Filled 2016-02-27: qty 1

## 2016-02-27 NOTE — Patient Instructions (Signed)
[facility] at [facility] Discharge Instructions  RECOMMENDATIONS MADE BY THE CONSULTANT AND ANY TEST RESULTS WILL BE SENT TO YOUR REFERRING PHYSICIAN.  Vitamin B12 1000 mcg injection given as ordered. Return as scheduled.  Thank you for choosing [facility] at [facility] to provide your oncology and hematology care.  To afford each patient quality time with our provider, please arrive at least 15 minutes before your scheduled appointment time.   Beginning January 23rd 2017 lab work for the Cancer Center will be done in the  Main lab at [facility] on 1st floor. If you have a lab appointment with the Cancer Center please come in thru the  Main Entrance and check in at the main information desk  You need to re-schedule your appointment should you arrive 10 or more minutes late.  We strive to give you quality time with our providers, and arriving late affects you and other patients whose appointments are after yours.  Also, if you no show three or more times for appointments you may be dismissed from the clinic at the providers discretion.     Again, thank you for choosing [facility].  Our hope is that these requests will decrease the amount of time that you wait before being seen by our physicians.       _____________________________________________________________  Should you have questions after your visit to [facility], please contact our office at [phone] between the hours of 8:30 a.m. and 4:30 p.m.  Voicemails left after 4:30 p.m. will not be returned until the following business day.  For prescription refill requests, have your pharmacy contact our office.         Resources For Cancer Patients and their Caregivers ? American Cancer Society: Can assist with transportation, wigs, general needs, runs Look Good Feel Better.        1-888-227-6333 ? Cancer Care: Provides financial assistance, online support  groups, medication/co-pay assistance.  1-800-813-HOPE (4673) ? [facility] Assists [place] cancer patients and their families through emotional , educational and financial support.  336-427-4357 ? [place] DSS Where to apply for food stamps, Medicaid and utility assistance. 336-342-1394 ? RCATS: Transportation to medical appointments. 336-347-2287 ? Social Security Administration: May apply for disability if have a Stage IV cancer. 336-342-7796 1-800-772-1213 ? [place] Aging, Disability and Transit Services: Assists with nutrition, care and transit needs. 336-349-2343  Cancer Center Support Programs: @[email or web address]@ > Cancer Support Group  2nd Tuesday of the month 1pm-2pm, Journey Room  > Creative Journey  3rd Tuesday of the month 1130am-1pm, Journey Room  > Look Good Feel Better  1st Wednesday of the month 10am-12 noon, Journey Room (Call American Cancer Society to register 1-800-395-5775)   

## 2016-02-27 NOTE — Progress Notes (Signed)
Sheri Jones presents today for injection per MD orders. B12 1000 mcg  administered IM in right upper outer Gluteal. Administration without incident. Patient tolerated well.  

## 2016-03-20 ENCOUNTER — Encounter: Payer: Self-pay | Admitting: Orthopedic Surgery

## 2016-03-20 ENCOUNTER — Ambulatory Visit (INDEPENDENT_AMBULATORY_CARE_PROVIDER_SITE_OTHER): Payer: Federal, State, Local not specified - PPO

## 2016-03-20 ENCOUNTER — Ambulatory Visit (INDEPENDENT_AMBULATORY_CARE_PROVIDER_SITE_OTHER): Payer: Federal, State, Local not specified - PPO | Admitting: Orthopedic Surgery

## 2016-03-20 DIAGNOSIS — Z96651 Presence of right artificial knee joint: Secondary | ICD-10-CM | POA: Diagnosis not present

## 2016-03-20 NOTE — Progress Notes (Signed)
ANNUAL FOLLOW UP FOR  RIGHT  TKA   Chief Complaint  Patient presents with  . Follow-up    ANNUAL XRAY RT TKA, 02/23/15     HPI: The patient is here for the RIGHT KNEE 1  year follow-up x-ray   System review DENIES LEFT KNEE PAIN   MILD ACHE WITH WEATHER CHANGES RIGHT KNEE    Examination of the RIGHT  KNEE   Inspection shows : incision healed nicely without erythema, no tenderness no swelling  Range of motion total range of motion is 0-90  Stability the knee is stable anterior to posterior as well as medial to lateral  Strength quadriceps strength is normal  Skin no erythema around the skin incision  Cardiovascular NO EDEMA    Medical decision-making section  X-rays ordered with the following personal interpretation  Radiology report  3 views RIGHT  knee  AP lateral and patellar sunrise views of the knee  FINDINGS: A total knee prosthesis is noted. It is in anatomic alignment. There is no evidence of loosening.  Impression : normal TKA  xray   Diagnosis  Plan follow-up 1 year repeat x-rays

## 2016-03-28 ENCOUNTER — Encounter (HOSPITAL_COMMUNITY): Payer: Self-pay

## 2016-03-28 ENCOUNTER — Encounter (HOSPITAL_COMMUNITY): Payer: Federal, State, Local not specified - PPO | Attending: Oncology

## 2016-03-28 VITALS — BP 120/89 | HR 75 | Temp 98.1°F | Resp 18

## 2016-03-28 DIAGNOSIS — E538 Deficiency of other specified B group vitamins: Secondary | ICD-10-CM | POA: Insufficient documentation

## 2016-03-28 MED ORDER — CYANOCOBALAMIN 1000 MCG/ML IJ SOLN
1000.0000 ug | Freq: Once | INTRAMUSCULAR | Status: AC
  Administered 2016-03-28: 1000 ug via INTRAMUSCULAR

## 2016-03-28 MED ORDER — CYANOCOBALAMIN 1000 MCG/ML IJ SOLN
INTRAMUSCULAR | Status: AC
  Filled 2016-03-28: qty 1

## 2016-03-28 NOTE — Patient Instructions (Signed)
[place] at [facility]  Discharge Instructions:  B12 injection today. Follow up as scheduled Please call the clinic if you have any questions are concerned.  _______________________________________________________________  Thank you for choosing [place] at [facility] to provide your oncology and hematology care.  To afford each patient quality time with our providers, please arrive at least 15 minutes before your scheduled appointment.  You need to re-schedule your appointment if you arrive 10 or more minutes late.  We strive to give you quality time with our providers, and arriving late affects you and other patients whose appointments are after yours.  Also, if you no show three or more times for appointments you may be dismissed from the clinic.  Again, thank you for choosing [place] at [facility] hope is that these requests will allow you access to exceptional care and in a timely manner. _______________________________________________________________  If you have questions after your visit, please contact our office at [phone] between the hours of 8:30 a.m. and 5:00 p.m. Voicemails left after 4:30 p.m. will not be returned until the following business day. _______________________________________________________________  For prescription refill requests, have your pharmacy contact our office. _______________________________________________________________  Recommendations made by the consultant and any test results will be sent to your referring physician. _______________________________________________________________

## 2016-03-28 NOTE — Progress Notes (Signed)
Sheri Jones presents today for injection per the provider's orders.  b12 in right upper outer quadrant administration without incident; see MAR for injection details.  Patient tolerated procedure well and without incident.  No questions or complaints noted at this time.

## 2016-04-03 ENCOUNTER — Other Ambulatory Visit: Payer: Self-pay | Admitting: Family Medicine

## 2016-04-04 ENCOUNTER — Telehealth: Payer: Self-pay

## 2016-04-04 NOTE — Telephone Encounter (Signed)
Patient has an appointment next week in which she will keep.  No complaints of urinary problems.  Labs are up to date.  Will call back with any new or worsening problems.

## 2016-04-11 ENCOUNTER — Ambulatory Visit (INDEPENDENT_AMBULATORY_CARE_PROVIDER_SITE_OTHER): Payer: Federal, State, Local not specified - PPO | Admitting: Family Medicine

## 2016-04-11 ENCOUNTER — Encounter: Payer: Self-pay | Admitting: Family Medicine

## 2016-04-11 VITALS — BP 120/78 | HR 68 | Resp 16 | Ht 63.0 in | Wt 178.0 lb

## 2016-04-11 DIAGNOSIS — R7303 Prediabetes: Secondary | ICD-10-CM

## 2016-04-11 DIAGNOSIS — Z1211 Encounter for screening for malignant neoplasm of colon: Secondary | ICD-10-CM | POA: Diagnosis not present

## 2016-04-11 DIAGNOSIS — E6609 Other obesity due to excess calories: Secondary | ICD-10-CM

## 2016-04-11 DIAGNOSIS — E559 Vitamin D deficiency, unspecified: Secondary | ICD-10-CM | POA: Diagnosis not present

## 2016-04-11 DIAGNOSIS — N6002 Solitary cyst of left breast: Secondary | ICD-10-CM | POA: Diagnosis not present

## 2016-04-11 DIAGNOSIS — M25561 Pain in right knee: Secondary | ICD-10-CM

## 2016-04-11 DIAGNOSIS — I10 Essential (primary) hypertension: Secondary | ICD-10-CM

## 2016-04-11 LAB — POC HEMOCCULT BLD/STL (OFFICE/1-CARD/DIAGNOSTIC): Fecal Occult Blood, POC: NEGATIVE

## 2016-04-11 NOTE — Patient Instructions (Addendum)
F/u in 5 months, call if you need me sooner  Normal exam today  Need US breast in March for follow up of cyst, we will schedulke Take calcium one twice daily  Labs today  It is important that you exercise regularly at least 30 minutes 5 times a week. If you develop chest pain, have severe difficulty breathing, or feel very tired, stop exercising immediately and seek medical attention  Please work on good  health habits so that your health will improve. 1. Commitment to daily physical activity for 30 to 60  minutes, if you are able to do this.  2. Commitment to wise food choices. Aim for half of your  food intake to be vegetable and fruit, one quarter starchy foods, and one quarter protein. Try to eat on a regular schedule  3 meals per day, snacking between meals should be limited to vegetables or fruits or small portions of nuts. 64 ounces of water per day is generally recommended, unless you have specific health conditions, like heart failure or kidney failure where you will need to limit fluid intake.  3. Commitment to sufficient and a  good quality of physical and mental rest daily, generally between 6 to 8 hours per day.  WITH PERSISTANCE AND PERSEVERANCE, THE IMPOSSIBLE , BECOMES THE NORM! Thank you  for choosing [facility]. We consider it a privelige to serve you.  Delivering excellent health care in a caring and  compassionate way is our goal.  Partnering with you,  so that together we can achieve this goal is our strategy.

## 2016-04-11 NOTE — Assessment & Plan Note (Signed)
Controlled, no change in medication DASH diet and commitment to daily physical activity for a minimum of 30 minutes discussed and encouraged, as a part of hypertension management. The importance of attaining a healthy weight is also discussed.  BP/Weight 04/11/2016 03/28/2016 02/27/2016 01/26/2016 12/27/2015 123XX123 [id number]  Systolic BP 123456 123456 [id number]  Diastolic BP 78 89 60 75 70 74 70  Wt. (Lbs) 178 - - - - 175 -  BMI 31.53 - - - - 31 -

## 2016-04-11 NOTE — Assessment & Plan Note (Signed)
Heme negative stool, no mass in rectum on exam

## 2016-04-11 NOTE — Progress Notes (Signed)
Sheri Jones     MRN: [id number]      DOB: 1945-12-26   HPI Sheri Jones is here for follow up and re-evaluation of chronic medical conditions, medication management and review of any available recent lab and radiology data.  Preventive health is updated, specifically  Cancer screening and Immunization.   Questions or concerns regarding consultations or procedures which the PT has had in the interim are  addressed. 2 week h/o intermittent left flank spasm, relieved with soma at bedtime, no inciting trauma , now resolvedThere are no new concerns.    ROS Denies recent fever or chills. Denies sinus pressure, nasal congestion, ear pain or sore throat. Denies chest congestion, productive cough or wheezing. Denies chest pains, palpitations and leg swelling Denies abdominal pain, nausea, vomiting,diarrhea or constipation.   Denies dysuria, frequency, hesitancy or incontinence. Denies joint pain, swelling and limitation in mobility. Denies headaches, seizures, numbness, or tingling. Denies depression, anxiety or insomnia. Denies skin break down or rash.   PE  BP 120/78   Pulse 68   Resp 16   Ht 5\' 3"  (1.6 m)   Wt 178 lb (80.7 kg)   SpO2 98%   BMI 31.53 kg/m   Patient alert and oriented and in no cardiopulmonary distress.  HEENT: No facial asymmetry, EOMI,   oropharynx pink and moist.  Neck supple no JVD, no mass.  Chest: Clear to auscultation bilaterally.  CVS: S1, S2 no murmurs, no S3.Regular rate.  ABD: Soft non tender. No organomegaly or mass, normal bS Rectal; no mass, heme negative stool  Ext: No edema  MS: Adequate ROM spine, shoulders, hips and knees.  Skin: Intact, no ulcerations or rash noted.  Psych: Good eye contact, normal affect. Memory intact not anxious or depressed appearing.  CNS: CN 2-12 intact, power,  normal throughout.no focal deficits noted.   Assessment & Plan Essential hypertension Controlled, no change in medication DASH diet and  commitment to daily physical activity for a minimum of 30 minutes discussed and encouraged, as a part of hypertension management. The importance of attaining a healthy weight is also discussed.  BP/Weight 04/11/2016 03/28/2016 02/27/2016 01/26/2016 12/27/2015 123XX123 [id number]  Systolic BP 123456 123456 [id number] [id number] [id number]  Diastolic BP 78 89 60 75 70 74 70  Wt. (Lbs) 178 - - - - 175 -  BMI 31.53 - - - - 31 -       Prediabetes Patient educated about the importance of limiting  Carbohydrate intake , the need to commit to daily physical activity for a minimum of 30 minutes , and to commit weight loss. The fact that changes in all these areas will reduce or eliminate all together the development of diabetes is stressed.   Diabetic Labs Latest Ref Rng & Units 11/29/2015 06/28/2015 05/12/2015 05/06/2015 02/24/2015  HbA1c <5.7 % 5.2 5.7(H) - - -  Chol 125 - 200 mg/dL - 168 - - -  HDL >=46 mg/dL - 73 - - -  Calc LDL <130 mg/dL - 82 - - -  Triglycerides <150 mg/dL - 65 - - -  Creatinine 0.50 - 0.99 mg/dL 0.99 0.84 0.86 0.96 0.80   BP/Weight 04/11/2016 03/28/2016 02/27/2016 01/26/2016 12/27/2015 [id number]  Systolic BP 123456 123456 [id number] [id number] [id number]  Diastolic BP 78 89 60 75 70 74 70  Wt. (Lbs) 178 - - - - 175 -  BMI 31.53 - - - - 31 -   No flowsheet data found.  Updated lab  needed at/ before next visit.   Obesity Deteriorated. Patient re-educated about  the importance of commitment to a  minimum of 150 minutes of exercise per week.  The importance of healthy food choices with portion control discussed. Encouraged to start a food diary, count calories and to consider  joining a support group. Sample diet sheets offered. Goals set by the patient for the next several months.   Weight /BMI 04/11/2016 11/29/2015 10/24/2015  WEIGHT 178 lb 175 lb 173 lb 6.4 oz  HEIGHT 5\' 3"  5\' 3"  -  BMI 31.53 kg/m2 31 kg/m2 30.72 kg/m2      Special screening for malignant neoplasms, colon Heme negative stool, no mass  in rectum on exam

## 2016-04-11 NOTE — Assessment & Plan Note (Signed)
Patient educated about the importance of limiting  Carbohydrate intake , the need to commit to daily physical activity for a minimum of 30 minutes , and to commit weight loss. The fact that changes in all these areas will reduce or eliminate all together the development of diabetes is stressed.   Diabetic Labs Latest Ref Rng & Units 11/29/2015 06/28/2015 05/12/2015 05/06/2015 02/24/2015  HbA1c <5.7 % 5.2 5.7(H) - - -  Chol 125 - 200 mg/dL - 168 - - -  HDL >=46 mg/dL - 73 - - -  Calc LDL <130 mg/dL - 82 - - -  Triglycerides <150 mg/dL - 65 - - -  Creatinine 0.50 - 0.99 mg/dL 0.99 0.84 0.86 0.96 0.80   BP/Weight 04/11/2016 03/28/2016 02/27/2016 01/26/2016 12/27/2015 [id number]  Systolic BP 123456 123456 [id number] Q000111Q [id number] [id number]  Diastolic BP 78 89 60 75 70 74 70  Wt. (Lbs) 178 - - - - 175 -  BMI 31.53 - - - - 31 -   No flowsheet data found.  Updated lab needed at/ before next visit.

## 2016-04-11 NOTE — Assessment & Plan Note (Signed)
Deteriorated. Patient re-educated about  the importance of commitment to a  minimum of 150 minutes of exercise per week.  The importance of healthy food choices with portion control discussed. Encouraged to start a food diary, count calories and to consider  joining a support group. Sample diet sheets offered. Goals set by the patient for the next several months.   Weight /BMI 04/11/2016 11/29/2015 10/24/2015  WEIGHT 178 lb 175 lb 173 lb 6.4 oz  HEIGHT 5\' 3"  5\' 3"  -  BMI 31.53 kg/m2 31 kg/m2 30.72 kg/m2

## 2016-04-12 ENCOUNTER — Encounter: Payer: Self-pay | Admitting: Family Medicine

## 2016-04-12 LAB — HEMOGLOBIN A1C
Hgb A1c MFr Bld: 5.3 % (ref <5.7)
MEAN PLASMA GLUCOSE: 105 mg/dL

## 2016-04-12 LAB — BASIC METABOLIC PANEL
BUN: 16 mg/dL (ref 7–25)
CHLORIDE: 104 mmol/L (ref 98–110)
CO2: 31 mmol/L (ref 20–31)
CREATININE: 0.93 mg/dL (ref 0.50–0.99)
Calcium: 9.1 mg/dL (ref 8.6–10.4)
Glucose, Bld: 82 mg/dL (ref 65–99)
Potassium: 4.2 mmol/L (ref 3.5–5.3)
SODIUM: 142 mmol/L (ref 135–146)

## 2016-04-12 LAB — VITAMIN D 25 HYDROXY (VIT D DEFICIENCY, FRACTURES): VIT D 25 HYDROXY: 66 ng/mL (ref 30–100)

## 2016-04-12 LAB — MAGNESIUM: MAGNESIUM: 1.8 mg/dL (ref 1.5–2.5)

## 2016-04-30 ENCOUNTER — Encounter (HOSPITAL_COMMUNITY): Payer: Federal, State, Local not specified - PPO | Attending: Oncology

## 2016-04-30 VITALS — BP 119/68 | HR 75 | Temp 98.6°F | Resp 18

## 2016-04-30 DIAGNOSIS — E538 Deficiency of other specified B group vitamins: Secondary | ICD-10-CM | POA: Insufficient documentation

## 2016-04-30 MED ORDER — CYANOCOBALAMIN 1000 MCG/ML IJ SOLN
1000.0000 ug | Freq: Once | INTRAMUSCULAR | Status: AC
  Administered 2016-04-30: 1000 ug via INTRAMUSCULAR

## 2016-04-30 MED ORDER — CYANOCOBALAMIN 1000 MCG/ML IJ SOLN
INTRAMUSCULAR | Status: AC
  Filled 2016-04-30: qty 1

## 2016-04-30 NOTE — Patient Instructions (Signed)
[facility] at [facility] Discharge Instructions  RECOMMENDATIONS MADE BY THE CONSULTANT AND ANY TEST RESULTS WILL BE SENT TO YOUR REFERRING PHYSICIAN.  Received Vit B12 injection today. Follow-up as scheduled. Call clinic for any questions or concerns  Thank you for choosing [facility] at [facility] to provide your oncology and hematology care.  To afford each patient quality time with our provider, please arrive at least 15 minutes before your scheduled appointment time.    If you have a lab appointment with the Cancer Center please come in thru the  Main Entrance and check in at the main information desk  You need to re-schedule your appointment should you arrive 10 or more minutes late.  We strive to give you quality time with our providers, and arriving late affects you and other patients whose appointments are after yours.  Also, if you no show three or more times for appointments you may be dismissed from the clinic at the providers discretion.     Again, thank you for choosing [facility].  Our hope is that these requests will decrease the amount of time that you wait before being seen by our physicians.       _____________________________________________________________  Should you have questions after your visit to [facility], please contact our office at [phone] between the hours of 8:30 a.m. and 4:30 p.m.  Voicemails left after 4:30 p.m. will not be returned until the following business day.  For prescription refill requests, have your pharmacy contact our office.       Resources For Cancer Patients and their Caregivers ? American Cancer Society: Can assist with transportation, wigs, general needs, runs Look Good Feel Better.        1-888-227-6333 ? Cancer Care: Provides financial assistance, online support groups, medication/co-pay assistance.  1-800-813-HOPE (4673) ? [facility] Assists [place] cancer patients and their families through emotional , educational and financial support.  336-427-4357 ? [place] DSS Where to apply for food stamps, Medicaid and utility assistance. 336-342-1394 ? RCATS: Transportation to medical appointments. 336-347-2287 ? Social Security Administration: May apply for disability if have a Stage IV cancer. 336-342-7796 1-800-772-1213 ? [place] Aging, Disability and Transit Services: Assists with nutrition, care and transit needs. 336-349-2343  Cancer Center Support Programs: @10RELATIVEDAYS@ > Cancer Support Group  2nd Tuesday of the month 1pm-2pm, Journey Room  > Creative Journey  3rd Tuesday of the month 1130am-1pm, Journey Room  > Look Good Feel Better  1st Wednesday of the month 10am-12 noon, Journey Room (Call American Cancer Society to register 1-800-395-5775)   

## 2016-04-30 NOTE — Progress Notes (Signed)
Sheri Jones tolerated Vit B12 injection well without complaints or incident. VSS Pt discharged self ambulatory in satisfactory condition

## 2016-05-07 ENCOUNTER — Encounter: Payer: Self-pay | Admitting: Family Medicine

## 2016-05-07 ENCOUNTER — Ambulatory Visit (INDEPENDENT_AMBULATORY_CARE_PROVIDER_SITE_OTHER): Payer: Federal, State, Local not specified - PPO | Admitting: Family Medicine

## 2016-05-07 VITALS — BP 118/76 | HR 68 | Temp 97.7°F | Resp 18 | Ht 63.0 in | Wt 180.1 lb

## 2016-05-07 DIAGNOSIS — H9191 Unspecified hearing loss, right ear: Secondary | ICD-10-CM | POA: Diagnosis not present

## 2016-05-07 DIAGNOSIS — H8110 Benign paroxysmal vertigo, unspecified ear: Secondary | ICD-10-CM

## 2016-05-07 MED ORDER — MECLIZINE HCL 25 MG PO TABS: 25.0000 mg | ORAL_TABLET | Freq: Three times a day (TID) | ORAL | 0 refills | Status: DC | PRN

## 2016-05-07 NOTE — Progress Notes (Signed)
Chief Complaint  Patient presents with  . 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 Special screening for malignant neoplasms, colon 04/11/2016  . 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 Knee pain, right 11/29/2014  . Excess skin of arm 11/29/2014  . Osteopenia 08/12/2013  . 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 Prediabetes 08/07/2011  . Vitamin D deficiency 08/07/2011  . Abnormal facial hair 03/29/2011  . PERSONAL HX COLONIC POLYPS 07/05/2010  . KNEE, ARTHRITIS, DEGEN./OSTEO 01/16/2010  . Vitamin B12 deficiency disease 10/28/2008  . 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 Obesity 08/15/2007  . Essential hypertension 08/15/2007    Outpatient Encounter Prescriptions as of 05/07/2016  Medication Sig  . aspirin 81 MG tablet Take 81 mg by mouth daily.  . Calcium-Vitamin D 500-125 MG-UNIT TABS Take 1 tablet by mouth daily.   . cetirizine (ZYRTEC) 10 MG tablet Take 10 mg by mouth daily. Reported on 10/24/2015  . cyanocobalamin (,VITAMIN B-12,) 1000 MCG/ML injection Inject 1,000 mcg into the muscle every 30 (thirty) days.  . hydrochlorothiazide (HYDRODIURIL) 25 MG tablet Take 1 tablet (25 mg total) by mouth daily.  Marland Kitchen KLOR-CON 10 10 MEQ tablet TAKE 1 TABLET BY MOUTH TWICE A DAY  . methocarbamol (ROBAXIN) 500 MG tablet Take 1 tablet (500 mg total) by mouth every 6 (six) hours as needed for muscle spasms.  . Multiple Vitamins-Minerals (CENTRUM ULTRA WOMENS PO) Take 1 tablet by mouth daily.    . Vitamin D, Ergocalciferol, (DRISDOL) 50000 units CAPS capsule Take 1 capsule (50,000 Units total) by mouth once a week.  . meclizine (ANTIVERT) 25 MG tablet Take 1 tablet (25 mg total) by mouth 3 (three) times daily as needed for dizziness.   No  facility-administered encounter medications on file as of 05/07/2016.     Allergies  Allergen Reactions  . Eggs Or Egg-Derived Products Diarrhea  . Milk-Related Compounds     Lactose intolerant     Review of Systems  Constitutional: Negative for activity change, appetite change, chills, fatigue and fever.  HENT: Positive for hearing loss. Negative for congestion, postnasal drip, rhinorrhea, sinus pain, sinus pressure and tinnitus.   Eyes: Negative for redness and visual disturbance.  Respiratory: Negative for cough and shortness of breath.   Cardiovascular: Negative for chest pain and palpitations.  Gastrointestinal: Positive for nausea. Negative for vomiting.  Musculoskeletal: Positive for arthralgias and gait problem.       Arthritis R knee  Neurological: Positive for dizziness. Negative for weakness, numbness and headaches.  Psychiatric/Behavioral: Negative for dysphoric mood and sleep disturbance.    BP 118/76 (BP Location: Right Arm, Patient Position: Sitting, Cuff Size: Normal)   Pulse 68   Temp 97.7 F (36.5 C) (Oral)   Resp 18   Ht 5\' 3"  (1.6 m)   Wt 180 lb 1.9 oz (81.7 kg)   SpO2 98%   BMI 31.91 kg/m   Physical Exam  Constitutional: She is oriented to person, place, and time. She appears well-developed and well-nourished. No distress.  HENT:  Head: Normocephalic and atraumatic.  Nose: Nose normal.  Mouth/Throat: Oropharynx is clear and moist. No oropharyngeal exudate.  Cerumen both ear canals  Eyes: Conjunctivae are normal. Pupils are equal, round, and reactive to light.  No nystagmus  Neck: Normal range of motion. Neck supple. Thyromegaly present.  Cardiovascular: Normal rate, regular rhythm and normal heart sounds.   Pulmonary/Chest: Effort normal and breath sounds normal.  Musculoskeletal:  Right knee replaced, left knee with valgus deformity and weakness  Lymphadenopathy:    She has no cervical adenopathy.  Neurological: She is alert and oriented to  person, place, and time. Coordination normal.  Skin: Skin is warm and dry.  Psychiatric: She has a normal mood and affect. Her behavior is normal. Thought content normal.    ASSESSMENT/PLAN:  1. Hearing loss of right ear, unspecified hearing loss type  - Ambulatory referral to ENT  2. Benign paroxysmal positional vertigo, unspecified laterality  - Ambulatory referral to ENT   Patient Instructions  Continue to drink plenty of water  Take the anitvert ( meclizine) as needed  I have placed a referral to the ear-nose-throat specialist   Raylene Everts, MD

## 2016-05-07 NOTE — Patient Instructions (Signed)
Continue to drink plenty of water  Take the anitvert ( meclizine) as needed  I have placed a referral to the ear-nose-throat specialist

## 2016-06-01 ENCOUNTER — Encounter (HOSPITAL_COMMUNITY): Payer: Federal, State, Local not specified - PPO | Attending: Oncology

## 2016-06-01 VITALS — BP 107/70 | HR 68 | Temp 98.1°F | Resp 18

## 2016-06-01 DIAGNOSIS — E538 Deficiency of other specified B group vitamins: Secondary | ICD-10-CM

## 2016-06-01 MED ORDER — CYANOCOBALAMIN 1000 MCG/ML IJ SOLN
1000.0000 ug | Freq: Once | INTRAMUSCULAR | Status: AC
  Administered 2016-06-01: 1000 ug via INTRAMUSCULAR
  Filled 2016-06-01: qty 1

## 2016-06-01 NOTE — Patient Instructions (Signed)
[facility] at [facility] Discharge Instructions  RECOMMENDATIONS MADE BY THE CONSULTANT AND ANY TEST RESULTS WILL BE SENT TO YOUR REFERRING PHYSICIAN.  Vitamin B12 1000 mcg injection given as ordered. Return as scheduled.  Thank you for choosing [facility] at [facility] to provide your oncology and hematology care.  To afford each patient quality time with our provider, please arrive at least 15 minutes before your scheduled appointment time.    If you have a lab appointment with the Cancer Center please come in thru the  Main Entrance and check in at the main information desk  You need to re-schedule your appointment should you arrive 10 or more minutes late.  We strive to give you quality time with our providers, and arriving late affects you and other patients whose appointments are after yours.  Also, if you no show three or more times for appointments you may be dismissed from the clinic at the providers discretion.     Again, thank you for choosing [facility].  Our hope is that these requests will decrease the amount of time that you wait before being seen by our physicians.       _____________________________________________________________  Should you have questions after your visit to [facility], please contact our office at [phone] between the hours of 8:30 a.m. and 4:30 p.m.  Voicemails left after 4:30 p.m. will not be returned until the following business day.  For prescription refill requests, have your pharmacy contact our office.       Resources For Cancer Patients and their Caregivers ? American Cancer Society: Can assist with transportation, wigs, general needs, runs Look Good Feel Better.        1-888-227-6333 ? Cancer Care: Provides financial assistance, online support groups, medication/co-pay assistance.  1-800-813-HOPE (4673) ? [facility] Assists [place] cancer patients and their families through emotional , educational and financial support.  336-427-4357 ? [place] DSS Where to apply for food stamps, Medicaid and utility assistance. 336-342-1394 ? RCATS: Transportation to medical appointments. 336-347-2287 ? Social Security Administration: May apply for disability if have a Stage IV cancer. 336-342-7796 1-800-772-1213 ? [place] Aging, Disability and Transit Services: Assists with nutrition, care and transit needs. 336-349-2343  Cancer Center Support Programs: @10RELATIVEDAYS@ > Cancer Support Group  2nd Tuesday of the month 1pm-2pm, Journey Room  > Creative Journey  3rd Tuesday of the month 1130am-1pm, Journey Room  > Look Good Feel Better  1st Wednesday of the month 10am-12 noon, Journey Room (Call American Cancer [employer] to register [phone])   

## 2016-06-01 NOTE — Progress Notes (Signed)
Sheri Jones presents today for injection per MD orders. B12 1000 mcg administered SQ in left upper outer Gluteal. Administration without incident. Patient tolerated well.

## 2016-06-04 ENCOUNTER — Ambulatory Visit (INDEPENDENT_AMBULATORY_CARE_PROVIDER_SITE_OTHER): Payer: Federal, State, Local not specified - PPO | Admitting: Otolaryngology

## 2016-06-04 DIAGNOSIS — H903 Sensorineural hearing loss, bilateral: Secondary | ICD-10-CM

## 2016-06-04 DIAGNOSIS — H6123 Impacted cerumen, bilateral: Secondary | ICD-10-CM | POA: Diagnosis not present

## 2016-06-05 ENCOUNTER — Encounter (HOSPITAL_COMMUNITY): Payer: Federal, State, Local not specified - PPO

## 2016-06-05 ENCOUNTER — Encounter (HOSPITAL_BASED_OUTPATIENT_CLINIC_OR_DEPARTMENT_OTHER): Payer: Federal, State, Local not specified - PPO | Admitting: Oncology

## 2016-06-05 ENCOUNTER — Ambulatory Visit (HOSPITAL_COMMUNITY): Payer: Federal, State, Local not specified - PPO | Admitting: Oncology

## 2016-06-05 ENCOUNTER — Other Ambulatory Visit (HOSPITAL_COMMUNITY): Payer: Federal, State, Local not specified - PPO

## 2016-06-05 ENCOUNTER — Encounter (HOSPITAL_COMMUNITY): Payer: Self-pay | Admitting: Oncology

## 2016-06-05 VITALS — BP 125/72 | HR 65 | Temp 97.5°F | Resp 16 | Ht 63.0 in | Wt 177.0 lb

## 2016-06-05 DIAGNOSIS — Z9884 Bariatric surgery status: Secondary | ICD-10-CM

## 2016-06-05 DIAGNOSIS — E538 Deficiency of other specified B group vitamins: Secondary | ICD-10-CM | POA: Diagnosis not present

## 2016-06-05 LAB — CBC WITH DIFFERENTIAL/PLATELET
BASOS PCT: 0 %
Basophils Absolute: 0 10*3/uL (ref 0.0–0.1)
EOS ABS: 0.5 10*3/uL (ref 0.0–0.7)
EOS PCT: 10 %
HCT: 37.5 % (ref 36.0–46.0)
Hemoglobin: 12.8 g/dL (ref 12.0–15.0)
Lymphocytes Relative: 47 %
Lymphs Abs: 2.2 10*3/uL (ref 0.7–4.0)
MCH: 31.1 pg (ref 26.0–34.0)
MCHC: 34.1 g/dL (ref 30.0–36.0)
MCV: 91.2 fL (ref 78.0–100.0)
MONO ABS: 0.5 10*3/uL (ref 0.1–1.0)
MONOS PCT: 10 %
NEUTROS PCT: 33 %
Neutro Abs: 1.6 10*3/uL — ABNORMAL LOW (ref 1.7–7.7)
Platelets: 186 10*3/uL (ref 150–400)
RBC: 4.11 MIL/uL (ref 3.87–5.11)
RDW: 14.9 % (ref 11.5–15.5)
WBC: 4.8 10*3/uL (ref 4.0–10.5)

## 2016-06-05 LAB — FERRITIN: Ferritin: 41 ng/mL (ref 11–307)

## 2016-06-05 LAB — IRON AND TIBC
Iron: 102 ug/dL (ref 28–170)
Saturation Ratios: 32 % — ABNORMAL HIGH (ref 10.4–31.8)
TIBC: 323 ug/dL (ref 250–450)
UIBC: 221 ug/dL

## 2016-06-05 LAB — VITAMIN B12: VITAMIN B 12: 1418 pg/mL — AB (ref 180–914)

## 2016-06-05 LAB — FOLATE: FOLATE: 22.5 ng/mL (ref >5.9)

## 2016-06-05 NOTE — Progress Notes (Signed)
[place], MD [address], [address] [place] [address]  Vitamin B12 deficiency disease - Plan: CBC with Differential, Vitamin B12  Gastric bypass status for obesity - Plan: CBC with Differential, Vitamin B12, Folate, Iron and TIBC, Ferritin  CURRENT THERAPY: Vitamin B 12 injections 1000 mcg every 4 weeks.   INTERVAL HISTORY: Sheri Jones 71 y.o. 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    She denies any hematologic complaints.  Appetite is strong. Weight is stable.  She notes a left knee pain. She has an appointment with Dr. Aline Brochure upcoming. He is fearful that she'll need a left knee replacement. She is status post right total knee arthroplasty.  He notes that she is back to walking more regularly. She is doing well with this.  She reports a history of decreased hearing and she saw ENT yesterday. She notes a large cerumen impaction was removed. She is hearing much better now, and back to baseline.  Her nephew lives with her now. He is in school at a local college.  Review of Systems  Constitutional: Negative.  Negative for chills, fever, malaise/fatigue and weight loss.  HENT: Negative.   Eyes: Negative.   Respiratory: Negative.  Negative for cough.   Cardiovascular: Negative.  Negative for chest pain.  Gastrointestinal: Negative.  Negative for constipation, diarrhea, nausea and vomiting.  Genitourinary: Negative.   Musculoskeletal: Negative.   Skin: Negative.   Neurological: Negative.  Negative for weakness.  Endo/Heme/Allergies: Negative.   Psychiatric/Behavioral: Negative.     Past Medical History:  Diagnosis Date  . Allergic rhinitis due to pollen   . Anemia   . Diabetes mellitus without complication ([place])   . 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  Obesity, unspecified   . Osteoarthrosis, unspecified whether generalized or localized, lower leg   . Unspecified essential hypertension   . Vitamin B12 deficiency disease 10/28/2008   Qualifier: Diagnosis of  By: Moshe Cipro MD, Joycelyn Schmid      Past Surgical History:  Procedure Laterality Date  . COLONOSCOPY  2008  . EXAM UNDER ANESTHESIA WITH MANIPULATION OF KNEE Right 05/11/2015   Procedure: EXAM UNDER ANESTHESIA WITH MANIPULATION OF KNEE;  Surgeon: Carole Civil, MD;  Location: [facility];  Service: Orthopedics;  Laterality: Right;  . GASTRIC BYPASS  2004  . TOTAL KNEE ARTHROPLASTY Right 02/23/2015   Procedure: RIGHT TOTAL KNEE ARTHROPLASTY;  Surgeon: Carole Civil, MD;  Location: [facility];  Service: Orthopedics;  Laterality: Right;    Family History  Problem Relation Age of Onset  . Hypertension Mother   . Hypertension Sister   . Hypertension Sister   . Stroke Sister 59  . Hypertension Sister   . Heart disease Father 39    massive heart attack   . Breast cancer Maternal Aunt   . Lung cancer Maternal Aunt     Social History   Social History  . Marital status: Single    Spouse name: N/A  . Number of children: N/A  . Years of education: N/A   Social History Main Topics  . Smoking status: Never Smoker  . Smokeless tobacco: Never Used  . Alcohol use No  . Drug use: No  . Sexual activity: Not Currently   Other Topics Concern  . Not on file  Social History Narrative  . No narrative on file     PHYSICAL EXAMINATION  ECOG PERFORMANCE STATUS: 0 - Asymptomatic  There were no vitals filed for this visit.  Vitals - 1 value per visit [id number]  SYSTOLIC [id number]  DIASTOLIC 70  Pulse 68  Temperature 98.1  Respirations 18    GENERAL:alert, no distress, well nourished, well developed, comfortable, cooperative, obese, smiling and unaccompanied SKIN: skin color, texture, turgor are normal HEAD: Normocephalic, No masses, lesions, tenderness or abnormalities EYES: normal,  EOMI, Conjunctiva are pink and non-injected EARS: External ears normal OROPHARYNX:lips, buccal mucosa, and tongue normal and mucous membranes are moist  NECK: supple, trachea midline LYMPH:  no palpable lymphadenopathy BREAST:not examined LUNGS: clear to auscultation and percussion HEART: regular rate & rhythm, no murmurs, no gallops, S1 normal and S2 normal ABDOMEN:abdomen soft, non-tender and normal bowel sounds BACK: Back symmetric, no curvature. EXTREMITIES:less then 2 second capillary refill, no joint deformities, effusion, or inflammation, no skin discoloration, no cyanosis  NEURO: alert & oriented x 3 with fluent speech, no focal motor/sensory deficits, gait normal   LABORATORY DATA: CBC    Component Value Date/Time   WBC 4.8 06/05/2016 0956   RBC 4.11 06/05/2016 0956   HGB 12.8 06/05/2016 0956   HCT 37.5 06/05/2016 0956   PLT 186 06/05/2016 0956   MCV 91.2 06/05/2016 0956   MCH 31.1 06/05/2016 0956   MCHC 34.1 06/05/2016 0956   RDW 14.9 06/05/2016 0956   LYMPHSABS 2.2 06/05/2016 0956   MONOABS 0.5 06/05/2016 0956   EOSABS 0.5 06/05/2016 0956   BASOSABS 0.0 06/05/2016 0956      Chemistry      Component Value Date/Time   NA 142 04/11/2016 1147   K 4.2 04/11/2016 1147   CL 104 04/11/2016 1147   CO2 31 04/11/2016 1147   BUN 16 04/11/2016 1147   CREATININE 0.93 04/11/2016 1147      Component Value Date/Time   CALCIUM 9.1 04/11/2016 1147   ALKPHOS 61 06/28/2015 1423   AST 20 06/28/2015 1423   ALT 14 06/28/2015 1423   BILITOT 0.4 06/28/2015 1423     Lab Results  Component Value Date   VITAMINB12 1,057 (H) 06/06/2015   Lab Results  Component Value Date   FOLATE >40.0 06/06/2015   Lab Results  Component Value Date   IRON 57 06/06/2015   TIBC 325 06/06/2015   FERRITIN 32 06/06/2015    PENDING LABS:   RADIOGRAPHIC STUDIES:  No results found.   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    Continue Vit B12 injections monthly.    Return in 12 months for follow-up.  Gastric bypass status for obesity Gastric bypass surgery January 2004.    Labs today and in 12 months: CBC diff, Vit B12, iron/TIBC, ferritin, folate, and serum copper.      ORDERS PLACED FOR THIS ENCOUNTER: Orders Placed This Encounter  Procedures  . CBC with Differential  . Vitamin B12  . Folate  . Iron and TIBC  . Ferritin    MEDICATIONS PRESCRIBED THIS ENCOUNTER: No orders of the defined types were placed in this encounter.   THERAPY PLAN:  Continue with monthly B12 injections.  All questions were answered. The patient knows to call the clinic with any problems, questions  or concerns. We can certainly see the patient much sooner if necessary.  Patient and plan discussed with Dr. Twana First and she is in agreement with the aforementioned.   This note is electronically signed by: Doy Mince 06/05/2016 10:43 AM

## 2016-06-05 NOTE — Assessment & Plan Note (Signed)
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  Continue Vit B12 injections monthly.  Return in 12 months for follow-up. 

## 2016-06-05 NOTE — Assessment & Plan Note (Signed)
Gastric bypass surgery January 2004.    Labs today and in 12 months: CBC diff, Vit B12, iron/TIBC, ferritin, folate, and serum copper.

## 2016-06-05 NOTE — Patient Instructions (Signed)
[place] at [facility] Discharge Instructions  RECOMMENDATIONS MADE BY THE CONSULTANT AND ANY TEST RESULTS WILL BE SENT TO YOUR REFERRING PHYSICIAN.  You were seen today by Kirby Crigler PA-C. Continue monthly B12 injections. Labs today, we will call with results. Return in 12 months for labs and follow up.   Thank you for choosing [place] at [facility] to provide your oncology and hematology care.  To afford each patient quality time with our provider, please arrive at least 15 minutes before your scheduled appointment time.    If you have a lab appointment with the [place] please come in thru the  Main Entrance and check in at the main information desk  You need to re-schedule your appointment should you arrive 10 or more minutes late.  We strive to give you quality time with our providers, and arriving late affects you and other patients whose appointments are after yours.  Also, if you no show three or more times for appointments you may be dismissed from the clinic at the providers discretion.     Again, thank you for choosing [facility].  Our hope is that these requests will decrease the amount of time that you wait before being seen by our physicians.       _____________________________________________________________  Should you have questions after your visit to [facility], please contact our office at [phone] between the hours of 8:30 a.m. and 4:30 p.m.  Voicemails left after 4:30 p.m. will not be returned until the following business day.  For prescription refill requests, have your pharmacy contact our office.       Resources For Cancer Patients and their Caregivers ? American Cancer Society: Can assist with transportation, wigs, general needs, runs Look Good Feel Better.        567-651-6572 ? Cancer Care: Provides financial assistance, online support groups, medication/co-pay  assistance.  1-800-813-HOPE 9084563500) ? [place] Assists [place] cancer patients and their families through emotional , educational and financial support.  2242954272 ? [place] DSS Where to apply for food stamps, Medicaid and utility assistance. (724)530-0234 ? RCATS: Transportation to medical appointments. (406)328-3437 ? Social Security Administration: May apply for disability if have a Stage IV cancer. (639)687-5054 801-821-7715 ? [employer], Disability and Transit Services: Assists with nutrition, care and transit needs. [place] Support Programs: @10RELATIVEDAYS @ > Cancer Support Group  2nd Tuesday of the month 1pm-2pm, Journey Room  > Creative Journey  3rd Tuesday of the month 1130am-1pm, Journey Room  > Look Good Feel Better  1st Wednesday of the month 10am-12 noon, Journey Room (Call [place] to register [phone])

## 2016-06-07 LAB — COPPER, SERUM: COPPER: 126 ug/dL (ref 72–166)

## 2016-06-16 ENCOUNTER — Telehealth: Payer: Self-pay

## 2016-06-16 DIAGNOSIS — N6002 Solitary cyst of left breast: Secondary | ICD-10-CM

## 2016-06-16 NOTE — Telephone Encounter (Signed)
Orders changed for left breast ultrasound from complete to limited

## 2016-06-19 ENCOUNTER — Other Ambulatory Visit: Payer: Self-pay | Admitting: Family Medicine

## 2016-06-26 ENCOUNTER — Ambulatory Visit (HOSPITAL_COMMUNITY): Payer: Medicare Other

## 2016-06-26 ENCOUNTER — Ambulatory Visit (HOSPITAL_COMMUNITY)
Admission: RE | Admit: 2016-06-26 | Discharge: 2016-06-26 | Disposition: A | Discharge status: Home / Self Care | Payer: Federal, State, Local not specified - PPO | Source: Ambulatory Visit | Attending: Family Medicine | Admitting: Family Medicine

## 2016-06-26 DIAGNOSIS — N6002 Solitary cyst of left breast: Secondary | ICD-10-CM | POA: Diagnosis not present

## 2016-06-29 ENCOUNTER — Encounter (HOSPITAL_COMMUNITY): Payer: Self-pay

## 2016-06-29 ENCOUNTER — Encounter (HOSPITAL_COMMUNITY): Payer: Medicare Other | Attending: Oncology

## 2016-06-29 VITALS — BP 121/64 | HR 73 | Temp 97.7°F | Resp 16

## 2016-06-29 DIAGNOSIS — E538 Deficiency of other specified B group vitamins: Secondary | ICD-10-CM

## 2016-06-29 MED ORDER — CYANOCOBALAMIN 1000 MCG/ML IJ SOLN
1000.0000 ug | Freq: Once | INTRAMUSCULAR | Status: AC
  Administered 2016-06-29: 1000 ug via INTRAMUSCULAR

## 2016-06-29 MED ORDER — CYANOCOBALAMIN 1000 MCG/ML IJ SOLN
INTRAMUSCULAR | Status: AC
  Filled 2016-06-29: qty 1

## 2016-06-29 NOTE — Patient Instructions (Signed)
[place] at [facility] Discharge Instructions  RECOMMENDATIONS MADE BY THE CONSULTANT AND ANY TEST RESULTS WILL BE SENT TO YOUR REFERRING PHYSICIAN.  You were given your monthly B12 injection today. Continue B12 injections. Return as scheduled.   Thank you for choosing [place] at [facility] to provide your oncology and hematology care.  To afford each patient quality time with our provider, please arrive at least 15 minutes before your scheduled appointment time.    If you have a lab appointment with the [place] please come in thru the  Main Entrance and check in at the main information desk  You need to re-schedule your appointment should you arrive 10 or more minutes late.  We strive to give you quality time with our providers, and arriving late affects you and other patients whose appointments are after yours.  Also, if you no show three or more times for appointments you may be dismissed from the clinic at the providers discretion.     Again, thank you for choosing [facility].  Our hope is that these requests will decrease the amount of time that you wait before being seen by our physicians.       _____________________________________________________________  Should you have questions after your visit to [facility], please contact our office at [phone] between the hours of 8:30 a.m. and 4:30 p.m.  Voicemails left after 4:30 p.m. will not be returned until the following business day.  For prescription refill requests, have your pharmacy contact our office.       Resources For Cancer Patients and their Caregivers ? American Cancer Society: Can assist with transportation, wigs, general needs, runs Look Good Feel Better.        620-272-8477 ? Cancer Care: Provides financial assistance, online support groups, medication/co-pay assistance.  1-800-813-HOPE 5087747334) ? [place] Assists [place] cancer patients and their families through emotional , educational and financial support.  (754) 447-4954 ? [place] DSS Where to apply for food stamps, Medicaid and utility assistance. 301-032-7993 ? RCATS: Transportation to medical appointments. 336-504-1068 ? Social Security Administration: May apply for disability if have a Stage IV cancer. 516-192-9816 [phone] ? [employer], Disability and Transit Services: Assists with nutrition, care and transit needs. [place] Support Programs: @10RELATIVEDAYS @ > Cancer Support Group  2nd Tuesday of the month 1pm-2pm, Journey Room  > Creative Journey  3rd Tuesday of the month 1130am-1pm, Journey Room  > Look Good Feel Better  1st Wednesday of the month 10am-12 noon, Journey Room (Call [place] to register [phone])

## 2016-06-29 NOTE — Progress Notes (Signed)
Pt here today for B12 injection. Pt given B12 injection in right hip. Pt tolerated well. Pt stable and discharged home ambulatory. Pt to return as scheduled for monthly B12 injections.

## 2016-07-07 ENCOUNTER — Other Ambulatory Visit: Payer: Self-pay | Admitting: Family Medicine

## 2016-07-30 ENCOUNTER — Encounter (HOSPITAL_COMMUNITY): Payer: Federal, State, Local not specified - PPO | Attending: Oncology

## 2016-07-30 VITALS — BP 127/81 | HR 76 | Temp 97.7°F | Resp 16

## 2016-07-30 DIAGNOSIS — E538 Deficiency of other specified B group vitamins: Secondary | ICD-10-CM | POA: Diagnosis not present

## 2016-07-30 MED ORDER — CYANOCOBALAMIN 1000 MCG/ML IJ SOLN
1000.0000 ug | Freq: Once | INTRAMUSCULAR | Status: AC
  Administered 2016-07-30: 1000 ug via INTRAMUSCULAR

## 2016-07-30 MED ORDER — CYANOCOBALAMIN 1000 MCG/ML IJ SOLN
INTRAMUSCULAR | Status: AC
  Filled 2016-07-30: qty 1

## 2016-07-30 NOTE — Patient Instructions (Signed)
[facility] at [facility] Discharge Instructions  RECOMMENDATIONS MADE BY THE CONSULTANT AND ANY TEST RESULTS WILL BE SENT TO YOUR REFERRING PHYSICIAN.  Vitamin B12 1000 mcg injection given as ordered.  Thank you for choosing [facility] at [facility] to provide your oncology and hematology care.  To afford each patient quality time with our provider, please arrive at least 15 minutes before your scheduled appointment time.    If you have a lab appointment with the Cancer Center please come in thru the  Main Entrance and check in at the main information desk  You need to re-schedule your appointment should you arrive 10 or more minutes late.  We strive to give you quality time with our providers, and arriving late affects you and other patients whose appointments are after yours.  Also, if you no show three or more times for appointments you may be dismissed from the clinic at the providers discretion.     Again, thank you for choosing [facility].  Our hope is that these requests will decrease the amount of time that you wait before being seen by our physicians.       _____________________________________________________________  Should you have questions after your visit to [facility], please contact our office at [phone] between the hours of 8:30 a.m. and 4:30 p.m.  Voicemails left after 4:30 p.m. will not be returned until the following business day.  For prescription refill requests, have your pharmacy contact our office.       Resources For Cancer Patients and their Caregivers ? American Cancer Society: Can assist with transportation, wigs, general needs, runs Look Good Feel Better.        1-888-227-6333 ? Cancer Care: Provides financial assistance, online support groups, medication/co-pay assistance.  1-800-813-HOPE (4673) ? [facility] Assists [place] cancer patients and  their families through emotional , educational and financial support.  336-427-4357 ? [place] DSS Where to apply for food stamps, Medicaid and utility assistance. 336-342-1394 ? RCATS: Transportation to medical appointments. 336-347-2287 ? Social Security Administration: May apply for disability if have a Stage IV cancer. 336-342-7796 1-800-772-1213 ? [place] Aging, Disability and Transit Services: Assists with nutrition, care and transit needs. 336-349-2343  Cancer Center Support Programs: @10RELATIVEDAYS@ > Cancer Support Group  2nd Tuesday of the month 1pm-2pm, Journey Room  > Creative Journey  3rd Tuesday of the month 1130am-1pm, Journey Room  > Look Good Feel Better  1st Wednesday of the month 10am-12 noon, Journey Room (Call American Cancer Society to register [phone])   

## 2016-07-30 NOTE — Progress Notes (Signed)
Sheri Jones presents today for injection per MD orders. B12 1000 mcg  administered IM in right upper outer Gluteal. Administration without incident. Patient tolerated well.

## 2016-08-03 ENCOUNTER — Ambulatory Visit: Payer: Medicare Other | Admitting: Family Medicine

## 2016-08-15 ENCOUNTER — Ambulatory Visit (HOSPITAL_COMMUNITY)
Admission: RE | Admit: 2016-08-15 | Discharge: 2016-08-15 | Disposition: A | Discharge status: Home / Self Care | Payer: Federal, State, Local not specified - PPO | Source: Ambulatory Visit | Attending: Family Medicine | Admitting: Family Medicine

## 2016-08-15 ENCOUNTER — Encounter: Payer: Self-pay | Admitting: Family Medicine

## 2016-08-15 ENCOUNTER — Ambulatory Visit (INDEPENDENT_AMBULATORY_CARE_PROVIDER_SITE_OTHER): Payer: Federal, State, Local not specified - PPO | Admitting: Family Medicine

## 2016-08-15 VITALS — BP 134/72 | HR 80 | Temp 97.1°F | Resp 18 | Ht 63.0 in | Wt 184.0 lb

## 2016-08-15 DIAGNOSIS — J984 Other disorders of lung: Secondary | ICD-10-CM | POA: Insufficient documentation

## 2016-08-15 DIAGNOSIS — R05 Cough: Secondary | ICD-10-CM | POA: Diagnosis present

## 2016-08-15 DIAGNOSIS — R059 Cough, unspecified: Secondary | ICD-10-CM

## 2016-08-15 DIAGNOSIS — I10 Essential (primary) hypertension: Secondary | ICD-10-CM | POA: Diagnosis not present

## 2016-08-15 MED ORDER — BENZONATATE 100 MG PO CAPS: 100.0000 mg | ORAL_CAPSULE | Freq: Two times a day (BID) | ORAL | 0 refills | Status: DC | PRN

## 2016-08-15 NOTE — Patient Instructions (Signed)
f/u  As before, call if you need me sooner  Please get a CXR today  Decongestant pills sent in to help with lingering chest congestion , no more antibiotics needed  Thank you  for choosing [facility]. We consider it a privelige to serve you.  Delivering excellent health care in a caring and  compassionate way is our goal.  Partnering with you,  so that together we can achieve this goal is our strategy.

## 2016-08-15 NOTE — Assessment & Plan Note (Addendum)
1 week cough, will get CXR an prescribe tessalon perles as needed, represents post infectious/ allergic irritation, no need for antibiotic and this is explained

## 2016-08-17 ENCOUNTER — Encounter: Payer: Self-pay | Admitting: Family Medicine

## 2016-08-17 NOTE — Assessment & Plan Note (Signed)
Deteriorated. Patient re-educated about  the importance of commitment to a  minimum of 150 minutes of exercise per week.  The importance of healthy food choices with portion control discussed. Encouraged to start a food diary, count calories and to consider  joining a support group. Sample diet sheets offered. Goals set by the patient for the next several months.   Weight /BMI 08/15/2016 06/05/2016 05/07/2016  WEIGHT 184 lb 177 lb 180 lb 1.9 oz  HEIGHT 5\' 3"  5\' 3"  5\' 3"   BMI 32.59 kg/m2 31.35 kg/m2 31.91 kg/m2

## 2016-08-17 NOTE — Assessment & Plan Note (Signed)
Controlled, no change in medication DASH diet and commitment to daily physical activity for a minimum of 30 minutes discussed and encouraged, as a part of hypertension management. The importance of attaining a healthy weight is also discussed.  BP/Weight 08/15/2016 07/30/2016 06/29/2016 06/05/2016 06/01/2016 11/30/1749 0/05/5850  Systolic BP 778 242 353 [phone] 086  Diastolic BP 72 81 64 72 70 76 68  Wt. (Lbs) 184 - - 177 - 180.12 -  BMI 32.59 - - 31.35 - 31.91 -

## 2016-08-17 NOTE — Progress Notes (Signed)
   Sheri Jones     MRN: [id number]      DOB: 25-Feb-1946   HPI Sheri Jones is here with a 1 week h/o dry lingering cough. She was treated with a 1 week course of antibiotic for sinus infection just over 1 week ago, generally improved greatly but has concerns about the cough which is non productive, worse at night and she has no fever or chills ROS See HPI Denies current sinus pressure or nasal drainage. Denies dysuria, frequency, hesitancy or incontinence. Denies uncontrolled  joint pain, swelling and limitation in mobility. Denies headaches, seizures, numbness, or tingling. Denies depression, anxiety or insomnia. Denies skin break down or rash.   PE  BP 134/72 (BP Location: Left Arm, Patient Position: Sitting, Cuff Size: Normal)   Pulse 80   Temp 97.1 F (36.2 C) (Temporal)   Resp 18   Ht 5\' 3"  (1.6 m)   Wt 184 lb (83.5 kg)   SpO2 97%   BMI 32.59 kg/m   Patient alert and oriented and in no cardiopulmonary distress.  HEENT: No facial asymmetry, EOMI,   oropharynx pink and moist.  Neck supple no JVD, no mass.No sinus tenderness  Chest: Clear to auscultation bilaterally.  CVS: S1, S2 no murmurs, no S3.Regular rate.  ABD: Soft non tender.   Ext: No edema  MS: Adequate ROM spine, shoulders, hips and knees.  Skin: Intact, no ulcerations or rash noted.    Assessment & Plan  Cough 1 week cough, will get CXR an prescribe tessalon perles as needed, represents post infectious/ allergic irritation, no need for antibiotic and this is explained  Essential hypertension Controlled, no change in medication DASH diet and commitment to daily physical activity for a minimum of 30 minutes discussed and encouraged, as a part of hypertension management. The importance of attaining a healthy weight is also discussed.  BP/Weight 08/15/2016 07/30/2016 06/29/2016 06/05/2016 06/01/2016 01/06/9449 06/29/8826  Systolic BP 003 491 791 [phone]  Diastolic BP 72 81 64 72 70 76 68  Wt. (Lbs)  184 - - 177 - 180.12 -  BMI 32.59 - - 31.35 - 31.91 -       Obesity Deteriorated. Patient re-educated about  the importance of commitment to a  minimum of 150 minutes of exercise per week.  The importance of healthy food choices with portion control discussed. Encouraged to start a food diary, count calories and to consider  joining a support group. Sample diet sheets offered. Goals set by the patient for the next several months.   Weight /BMI 08/15/2016 06/05/2016 05/07/2016  WEIGHT 184 lb 177 lb 180 lb 1.9 oz  HEIGHT 5\' 3"  5\' 3"  5\' 3"   BMI 32.59 kg/m2 31.35 kg/m2 31.91 kg/m2

## 2016-08-29 ENCOUNTER — Encounter (HOSPITAL_COMMUNITY): Payer: Self-pay

## 2016-08-29 ENCOUNTER — Encounter (HOSPITAL_COMMUNITY): Payer: Federal, State, Local not specified - PPO | Attending: Oncology

## 2016-08-29 VITALS — BP 147/73 | HR 66 | Temp 98.0°F | Resp 18

## 2016-08-29 DIAGNOSIS — E538 Deficiency of other specified B group vitamins: Secondary | ICD-10-CM

## 2016-08-29 MED ORDER — CYANOCOBALAMIN 1000 MCG/ML IJ SOLN
1000.0000 ug | Freq: Once | INTRAMUSCULAR | Status: AC
  Administered 2016-08-29: 1000 ug via INTRAMUSCULAR
  Filled 2016-08-29: qty 1

## 2016-08-29 NOTE — Progress Notes (Signed)
Sheri Jones presents today for injection per the provider's orders.  B12 administration without incident; see MAR for injection details.  Patient tolerated procedure well and without incident.  No questions or complaints noted at this time.  Discharged ambulatory.  

## 2016-08-29 NOTE — Patient Instructions (Signed)
[facility] Discharge Instructions  RECOMMENDATIONS MADE BY THE CONSULTANT AND ANY TEST RESULTS WILL BE SENT TO YOUR REFERRING PHYSICIAN.  B12 injection today. Return as scheduled.   Thank you for choosing [facility] at [facility] to provide your oncology and hematology care.  To afford each patient quality time with our provider, please arrive at least 15 minutes before your scheduled appointment time.    If you have a lab appointment with the Cancer Center please come in thru the  Main Entrance and check in at the main information desk  You need to re-schedule your appointment should you arrive 10 or more minutes late.  We strive to give you quality time with our providers, and arriving late affects you and other patients whose appointments are after yours.  Also, if you no show three or more times for appointments you may be dismissed from the clinic at the providers discretion.     Again, thank you for choosing [facility].  Our hope is that these requests will decrease the amount of time that you wait before being seen by our physicians.       _____________________________________________________________  Should you have questions after your visit to [facility], please contact our office at [phone] between the hours of 8:30 a.m. and 4:30 p.m.  Voicemails left after 4:30 p.m. will not be returned until the following business day.  For prescription refill requests, have your pharmacy contact our office.       Resources For Cancer Patients and their Caregivers ? American Cancer Society: Can assist with transportation, wigs, general needs, runs Look Good Feel Better.        1-888-227-6333 ? Cancer Care: Provides financial assistance, online support groups, medication/co-pay assistance.  1-800-813-HOPE (4673) ? [facility] Assists [place] cancer patients and their  families through emotional , educational and financial support.  336-427-4357 ? [place] DSS Where to apply for food stamps, Medicaid and utility assistance. 336-342-1394 ? RCATS: Transportation to medical appointments. 336-347-2287 ? Social Security Administration: May apply for disability if have a Stage IV cancer. 336-342-7796 1-800-772-1213 ? [place] Aging, Disability and Transit Services: Assists with nutrition, care and transit needs. 336-349-2343  Cancer Center Support Programs: [email or web address] > Cancer Support Group  2nd Tuesday of the month 1pm-2pm, Journey Room  > Creative Journey  3rd Tuesday of the month 1130am-1pm, Journey Room  > Look Good Feel Better  1st Wednesday of the month 10am-12 noon, Journey Room (Call American Cancer Society to register 1-800-395-5775)   

## 2016-09-10 ENCOUNTER — Ambulatory Visit (INDEPENDENT_AMBULATORY_CARE_PROVIDER_SITE_OTHER): Payer: Federal, State, Local not specified - PPO | Admitting: Family Medicine

## 2016-09-10 ENCOUNTER — Encounter: Payer: Self-pay | Admitting: Family Medicine

## 2016-09-10 VITALS — BP 130/82 | HR 75 | Resp 16 | Ht 63.0 in | Wt 184.0 lb

## 2016-09-10 DIAGNOSIS — E559 Vitamin D deficiency, unspecified: Secondary | ICD-10-CM | POA: Diagnosis not present

## 2016-09-10 DIAGNOSIS — Z13220 Encounter for screening for lipoid disorders: Secondary | ICD-10-CM | POA: Diagnosis not present

## 2016-09-10 DIAGNOSIS — I10 Essential (primary) hypertension: Secondary | ICD-10-CM | POA: Diagnosis not present

## 2016-09-10 DIAGNOSIS — R7303 Prediabetes: Secondary | ICD-10-CM

## 2016-09-10 DIAGNOSIS — E538 Deficiency of other specified B group vitamins: Secondary | ICD-10-CM | POA: Diagnosis not present

## 2016-09-10 MED ORDER — PHENTERMINE HCL 37.5 MG PO TABS: 37.5000 mg | ORAL_TABLET | Freq: Every day | ORAL | 1 refills | Status: DC

## 2016-09-10 NOTE — Patient Instructions (Signed)
F/u in 4 months, call if you need me before  Start  phentermine HALF tablet every morning  Commit to 3 meals every day between 7am and 7pm  WATER 64 ounces every day, no diet  Goal of 5 to 6 pound weight loss   It is important that you exercise regularly at least 30 minutes 5 times a week. If you develop chest pain, have severe difficulty breathing, or feel very tired, stop exercising immediately and seek medical attention    Fasting lipid, tsh, vit D and chem 7 1 week before next visit  Thank you  for choosing [facility]. We consider it a privelige to serve you.  Delivering excellent health care in a caring and  compassionate way is our goal.  Partnering with you,  so that together we can achieve this goal is our strategy.

## 2016-09-16 NOTE — Assessment & Plan Note (Signed)
Controlled, no change in medication DASH diet and commitment to daily physical activity for a minimum of 30 minutes discussed and encouraged, as a part of hypertension management. The importance of attaining a healthy weight is also discussed.  BP/Weight 09/10/2016 08/29/2016 08/15/2016 07/30/2016 06/29/2016 3/68/5992 06/24/1441  Systolic BP 601 658 006 349 494 473 958  Diastolic BP 82 73 72 81 64 72 70  Wt. (Lbs) 184 - 184 - - 177 -  BMI 32.59 - 32.59 - - 31.35 -

## 2016-09-16 NOTE — Assessment & Plan Note (Signed)
weight loss and continued exercise recommended, use of tylenol and topical rubs for pain management encouraged

## 2016-09-16 NOTE — Progress Notes (Signed)
Sheri Jones     MRN: [id number]      DOB: 1945-08-14   HPI Sheri Jones is here for follow up and re-evaluation of chronic medical conditions, medication management and review of any available recent lab and radiology data.  Preventive health is updated, specifically  Cancer screening and Immunization.   Questions or concerns regarding consultations or procedures which the PT has had in the interim are  Addressed.Still getting B12 injections at hematology clinic The PT denies any adverse reactions to current medications since the last visit.  C/o weight regain following bypass despite "best effort" wants short term phentermine to help kick off weight loss C/o stiffness in knees when first starts walking, noDenies recent fever or chills. Denies sinus pressure, nasal congestion, ear pain or sore throat. Denies chest congestion, productive cough or wheezing. Denies chest pains, palpitations and leg swelling Denies abdominal pain, nausea, vomiting,diarrhea or constipation.   Denies dysuria, frequency, hesitancy or incontinence.  Denies headaches, seizures, numbness, or tingling. Denies depression, increased anxiety due to Mom's failing health. Denies skin break down or rash.   PE  BP 130/82   Pulse 75   Resp 16   Ht 5\' 3"  (1.6 m)   Wt 184 lb (83.5 kg)   SpO2 96%   BMI 32.59 kg/m   Patient alert and oriented and in no cardiopulmonary distress.  HEENT: No facial asymmetry, EOMI,   oropharynx pink and moist.  Neck supple no JVD, no mass.  Chest: Clear to auscultation bilaterally.  CVS: S1, S2 no murmurs, no S3.Regular rate.  ABD: Soft non tender.   Ext: No edema  MS: Adequate ROM spine, shoulders, hips and reduced in  knees.  Skin: Intact, no ulcerations or rash noted.  Psych: Good eye contact, normal affect. Memory intact not anxious or depressed appearing.  CNS: CN 2-12 intact, power,  normal throughout.no focal deficits noted.   Assessment & Plan  Essential  hypertension Controlled, no change in medication DASH diet and commitment to daily physical activity for a minimum of 30 minutes discussed and encouraged, as a part of hypertension management. The importance of attaining a healthy weight is also discussed.  BP/Weight 09/10/2016 08/29/2016 08/15/2016 07/30/2016 06/29/2016 0/88/1103 04/27/9456  Systolic BP 592 924 462 863 817 711 657  Diastolic BP 82 73 72 81 64 72 70  Wt. (Lbs) 184 - 184 - - 177 -  BMI 32.59 - 32.59 - - 31.35 -       Obesity Deteriorated. Patient re-educated about  the importance of commitment to a  minimum of 150 minutes of exercise per week.  The importance of healthy food choices with portion control discussed. Encouraged to start a food diary, count calories and to consider  joining a support group. Sample diet sheets offered. Goals set by the patient for the next several months.   Weight /BMI 09/10/2016 08/15/2016 06/05/2016  WEIGHT 184 lb 184 lb 177 lb  HEIGHT 5\' 3"  5\' 3"  5\' 3"   BMI 32.59 kg/m2 32.59 kg/m2 31.35 kg/m2   Start half phentermine daily with weight loss goal of 5 pounds in 4 months   Prediabetes Patient educated about the importance of limiting  Carbohydrate intake , the need to commit to daily physical activity for a minimum of 30 minutes , and to commit weight loss. The fact that changes in all these areas will reduce or eliminate all together the development of diabetes is stressed.   Diabetic Labs Latest Ref Rng & Units 04/11/2016  11/29/2015 06/28/2015 05/12/2015 05/06/2015  HbA1c <5.7 % 5.3 5.2 5.7(H) - -  Chol 125 - 200 mg/dL - - 168 - -  HDL >=46 mg/dL - - 73 - -  Calc LDL <130 mg/dL - - 82 - -  Triglycerides <150 mg/dL - - 65 - -  Creatinine 0.50 - 0.99 mg/dL 0.93 0.99 0.84 0.86 0.96   BP/Weight 09/10/2016 08/29/2016 08/15/2016 07/30/2016 06/29/2016 9/39/6886 07/30/4718  Systolic BP 721 828 833 744 514 604 799  Diastolic BP 82 73 72 81 64 72 70  Wt. (Lbs) 184 - 184 - - 177 -  BMI 32.59 - 32.59 - - 31.35 -     No flowsheet data found.  Updated lab needed at/ before next visit.   Vitamin D deficiency Updated lab needed at/ before next visit.   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

## 2016-09-16 NOTE — Assessment & Plan Note (Signed)
Updated lab needed at/ before next visit.   

## 2016-09-16 NOTE — Assessment & Plan Note (Signed)
Patient educated about the importance of limiting  Carbohydrate intake , the need to commit to daily physical activity for a minimum of 30 minutes , and to commit weight loss. The fact that changes in all these areas will reduce or eliminate all together the development of diabetes is stressed.   Diabetic Labs Latest Ref Rng & Units 04/11/2016 11/29/2015 06/28/2015 05/12/2015 05/06/2015  HbA1c <5.7 % 5.3 5.2 5.7(H) - -  Chol 125 - 200 mg/dL - - 168 - -  HDL >=46 mg/dL - - 73 - -  Calc LDL <130 mg/dL - - 82 - -  Triglycerides <150 mg/dL - - 65 - -  Creatinine 0.50 - 0.99 mg/dL 0.93 0.99 0.84 0.86 0.96   BP/Weight 09/10/2016 08/29/2016 08/15/2016 07/30/2016 06/29/2016 07/20/5186 07/22/6604  Systolic BP 301 601 093 235 573 220 254  Diastolic BP 82 73 72 81 64 72 70  Wt. (Lbs) 184 - 184 - - 177 -  BMI 32.59 - 32.59 - - 31.35 -   No flowsheet data found.  Updated lab needed at/ before next visit.

## 2016-09-16 NOTE — Assessment & Plan Note (Signed)
Deteriorated. Patient re-educated about  the importance of commitment to a  minimum of 150 minutes of exercise per week.  The importance of healthy food choices with portion control discussed. Encouraged to start a food diary, count calories and to consider  joining a support group. Sample diet sheets offered. Goals set by the patient for the next several months.   Weight /BMI 09/10/2016 08/15/2016 06/05/2016  WEIGHT 184 lb 184 lb 177 lb  HEIGHT 5\' 3"  5\' 3"  5\' 3"   BMI 32.59 kg/m2 32.59 kg/m2 31.35 kg/m2   Start half phentermine daily with weight loss goal of 5 pounds in 4 months

## 2016-09-16 NOTE — Assessment & Plan Note (Signed)
Treated at hematology with monthly injections

## 2016-09-18 ENCOUNTER — Telehealth: Payer: Self-pay | Admitting: Family Medicine

## 2016-09-18 NOTE — Telephone Encounter (Signed)
Patient is requesting refill for diet pills [facility] in [place] [phone]

## 2016-09-18 NOTE — Telephone Encounter (Signed)
This was sent last week.

## 2016-10-01 ENCOUNTER — Encounter (HOSPITAL_COMMUNITY): Payer: Self-pay

## 2016-10-01 ENCOUNTER — Encounter (HOSPITAL_COMMUNITY): Payer: Federal, State, Local not specified - PPO | Attending: Oncology

## 2016-10-01 VITALS — BP 136/78 | HR 71 | Temp 97.9°F | Resp 16

## 2016-10-01 DIAGNOSIS — E538 Deficiency of other specified B group vitamins: Secondary | ICD-10-CM | POA: Diagnosis not present

## 2016-10-01 MED ORDER — CYANOCOBALAMIN 1000 MCG/ML IJ SOLN
1000.0000 ug | Freq: Once | INTRAMUSCULAR | Status: AC
  Administered 2016-10-01: 1000 ug via INTRAMUSCULAR

## 2016-10-01 MED ORDER — CYANOCOBALAMIN 1000 MCG/ML IJ SOLN
INTRAMUSCULAR | Status: AC
  Filled 2016-10-01: qty 1

## 2016-10-01 NOTE — Progress Notes (Signed)
Borghild S Himes presents today for injection per the provider's orders.  B12 administration without incident; see MAR for injection details.  Patient tolerated procedure well and without incident.  No questions or complaints noted at this time.  Discharged ambulatory.  

## 2016-10-01 NOTE — Patient Instructions (Signed)
[facility] Discharge Instructions  RECOMMENDATIONS MADE BY THE CONSULTANT AND ANY TEST RESULTS WILL BE SENT TO YOUR REFERRING PHYSICIAN.  B12 injection today. Return as scheduled.   Thank you for choosing [facility] at [facility] to provide your oncology and hematology care.  To afford each patient quality time with our provider, please arrive at least 15 minutes before your scheduled appointment time.    If you have a lab appointment with the Cancer Center please come in thru the  Main Entrance and check in at the main information desk  You need to re-schedule your appointment should you arrive 10 or more minutes late.  We strive to give you quality time with our providers, and arriving late affects you and other patients whose appointments are after yours.  Also, if you no show three or more times for appointments you may be dismissed from the clinic at the providers discretion.     Again, thank you for choosing [facility].  Our hope is that these requests will decrease the amount of time that you wait before being seen by our physicians.       _____________________________________________________________  Should you have questions after your visit to [facility], please contact our office at [phone] between the hours of 8:30 a.m. and 4:30 p.m.  Voicemails left after 4:30 p.m. will not be returned until the following business day.  For prescription refill requests, have your pharmacy contact our office.       Resources For Cancer Patients and their Caregivers ? American Cancer Society: Can assist with transportation, wigs, general needs, runs Look Good Feel Better.        1-888-227-6333 ? Cancer Care: Provides financial assistance, online support groups, medication/co-pay assistance.  1-800-813-HOPE (4673) ? [facility] Assists [place] cancer patients and their  families through emotional , educational and financial support.  336-427-4357 ? [place] DSS Where to apply for food stamps, Medicaid and utility assistance. 336-342-1394 ? [employer]: Transportation to medical appointments. 336-347-2287 ? Social Security Administration: May apply for disability if have a Stage IV cancer. 336-342-7796 1-800-772-1213 ? [place] Aging, Disability and Transit Services: Assists with nutrition, care and transit needs. 336-349-2343  Cancer Center Support Programs: @[email or web address]@ > Cancer Support Group  2nd Tuesday of the month 1pm-2pm, Journey Room  > Creative Journey  3rd Tuesday of the month 1130am-1pm, Journey Room  > Look Good Feel Better  1st Wednesday of the month 10am-12 noon, Journey Room (Call American Cancer Society to register 1-800-395-5775)   

## 2016-10-03 ENCOUNTER — Other Ambulatory Visit: Payer: Self-pay | Admitting: Family Medicine

## 2016-10-05 ENCOUNTER — Other Ambulatory Visit: Payer: Self-pay | Admitting: Family Medicine

## 2016-10-05 ENCOUNTER — Telehealth: Payer: Self-pay | Admitting: *Deleted

## 2016-10-05 ENCOUNTER — Other Ambulatory Visit: Payer: Self-pay

## 2016-10-05 MED ORDER — POTASSIUM CHLORIDE ER 10 MEQ PO TBCR: 10.0000 meq | EXTENDED_RELEASE_TABLET | Freq: Two times a day (BID) | ORAL | 1 refills | Status: DC

## 2016-10-05 NOTE — Telephone Encounter (Signed)
Patient called left message requesting a medication refill on [place] take x2 daily. Please advise [phone]

## 2016-10-05 NOTE — Telephone Encounter (Signed)
Med sent.

## 2016-10-31 ENCOUNTER — Encounter (HOSPITAL_COMMUNITY): Payer: Federal, State, Local not specified - PPO | Attending: Oncology

## 2016-10-31 ENCOUNTER — Encounter (HOSPITAL_COMMUNITY): Payer: Self-pay

## 2016-10-31 VITALS — BP 139/81 | HR 74 | Temp 98.0°F | Resp 18

## 2016-10-31 DIAGNOSIS — E538 Deficiency of other specified B group vitamins: Secondary | ICD-10-CM | POA: Diagnosis not present

## 2016-10-31 MED ORDER — CYANOCOBALAMIN 1000 MCG/ML IJ SOLN
INTRAMUSCULAR | Status: AC
  Filled 2016-10-31: qty 1

## 2016-10-31 MED ORDER — CYANOCOBALAMIN 1000 MCG/ML IJ SOLN
1000.0000 ug | Freq: Once | INTRAMUSCULAR | Status: AC
  Administered 2016-10-31: 1000 ug via INTRAMUSCULAR

## 2016-10-31 NOTE — Progress Notes (Signed)
Sheri Jones presents today for injection per MD orders. B12 1,07mcg administered IM in right upper outer buttocks Administration without incident. Patient tolerated well.  Vitals stable and discharged home from clinic ambulatory today. Follow up as scheduled.

## 2016-10-31 NOTE — Patient Instructions (Signed)
[facility] at [facility] Discharge Instructions  RECOMMENDATIONS MADE BY THE CONSULTANT AND ANY TEST RESULTS WILL BE SENT TO YOUR REFERRING PHYSICIAN.  B12 injection done today Follow up as scheduled.  Thank you for choosing [facility] to provide your oncology and hematology care.  To afford each patient quality time with our provider, please arrive at least 15 minutes before your scheduled appointment time.    If you have a lab appointment with the Cancer Center please come in thru the  Main Entrance and check in at the main information desk  You need to re-schedule your appointment should you arrive 10 or more minutes late.  We strive to give you quality time with our providers, and arriving late affects you and other patients whose appointments are after yours.  Also, if you no show three or more times for appointments you may be dismissed from the clinic at the providers discretion.     Again, thank you for choosing [facility].  Our hope is that these requests will decrease the amount of time that you wait before being seen by our physicians.       _____________________________________________________________  Should you have questions after your visit to [facility], please contact our office at [phone] between the hours of 8:30 a.m. and 4:30 p.m.  Voicemails left after 4:30 p.m. will not be returned until the following business day.  For prescription refill requests, have your pharmacy contact our office.       Resources For Cancer Patients and their Caregivers ? American Cancer Society: Can assist with transportation, wigs, general needs, runs Look Good Feel Better.        1-888-227-6333 ? Cancer Care: Provides financial assistance, online support groups, medication/co-pay assistance.  1-800-813-HOPE (4673) ? [facility] Assists [place] cancer patients and  their families through emotional , educational and financial support.  336-427-4357 ? [place] DSS Where to apply for food stamps, Medicaid and utility assistance. 336-342-1394 ? RCATS: Transportation to medical appointments. 336-347-2287 ? Social Security Administration: May apply for disability if have a Stage IV cancer. 336-342-7796 1-800-772-1213 ? [place] Aging, Disability and Transit Services: Assists with nutrition, care and transit needs. 336-349-2343  Cancer Center Support Programs: @[email or web address]@ > Cancer Support Group  2nd Tuesday of the month 1pm-2pm, Journey Room  > Creative Journey  3rd Tuesday of the month 1130am-1pm, Journey Room  > Look Good Feel Better  1st Wednesday of the month 10am-12 noon, Journey Room (Call American Cancer Society to register 1-800-395-5775)   

## 2016-12-03 ENCOUNTER — Encounter (HOSPITAL_COMMUNITY): Payer: Self-pay

## 2016-12-03 ENCOUNTER — Encounter (HOSPITAL_COMMUNITY): Payer: Federal, State, Local not specified - PPO | Attending: Oncology

## 2016-12-03 VITALS — BP 103/81 | HR 85 | Temp 97.6°F | Resp 18

## 2016-12-03 DIAGNOSIS — E538 Deficiency of other specified B group vitamins: Secondary | ICD-10-CM | POA: Insufficient documentation

## 2016-12-03 MED ORDER — CYANOCOBALAMIN 1000 MCG/ML IJ SOLN
INTRAMUSCULAR | Status: AC
  Filled 2016-12-03: qty 1

## 2016-12-03 MED ORDER — CYANOCOBALAMIN 1000 MCG/ML IJ SOLN
1000.0000 ug | Freq: Once | INTRAMUSCULAR | Status: AC
  Administered 2016-12-03: 1000 ug via INTRAMUSCULAR

## 2016-12-03 NOTE — Patient Instructions (Signed)
[facility] Discharge Instructions  RECOMMENDATIONS MADE BY THE CONSULTANT AND ANY TEST RESULTS WILL BE SENT TO YOUR REFERRING PHYSICIAN.  Received Vit B12 injection today. Follow-up as scheduled. Call clinic for any questions or concerns  Thank you for choosing [facility] to provide your oncology and hematology care.  To afford each patient quality time with our provider, please arrive at least 15 minutes before your scheduled appointment time.    If you have a lab appointment with the Cancer Center please come in thru the  Main Entrance and check in at the main information desk  You need to re-schedule your appointment should you arrive 10 or more minutes late.  We strive to give you quality time with our providers, and arriving late affects you and other patients whose appointments are after yours.  Also, if you no show three or more times for appointments you may be dismissed from the clinic at the providers discretion.     Again, thank you for choosing [facility].  Our hope is that these requests will decrease the amount of time that you wait before being seen by our physicians.       _____________________________________________________________  Should you have questions after your visit to [facility], please contact our office at [phone] between the hours of 8:30 a.m. and 4:30 p.m.  Voicemails left after 4:30 p.m. will not be returned until the following business day.  For prescription refill requests, have your pharmacy contact our office.       Resources For Cancer Patients and their Caregivers ? American Cancer Society: Can assist with transportation, wigs, general needs, runs Look Good Feel Better.        1-888-227-6333 ? Cancer Care: Provides financial assistance, online support groups, medication/co-pay assistance.  1-800-813-HOPE (4673) ? [facility] Assists [place] cancer patients and their families through emotional , educational and financial support.  336-427-4357 ? [place] DSS Where to apply for food stamps, Medicaid and utility assistance. 336-342-1394 ? [employer]ATS: Transportation to medical appointments. 336-347-2287 ? Social Security Administration: May apply for disability if have a Stage IV cancer. 336-342-7796 1-800-772-1213 ? [place] Aging, Disability and Transit Services: Assists with nutrition, care and transit needs. 336-349-2343  Cancer Center Support Programs: @[email or web address]@ > Cancer Support Group  2nd Tuesday of the month 1pm-2pm, Journey Room  > Creative Journey  3rd Tuesday of the month 1130am-1pm, Journey Room  > Look Good Feel Better  1st Wednesday of the month 10am-12 noon, Journey Room (Call American Cancer Society to register 1-800-395-5775)   

## 2016-12-03 NOTE — Progress Notes (Signed)
Sheri Jones tolerated Vit B12 injection well without complaints or incident. VSS Pt discharged self ambulatory in satisfactory condition

## 2016-12-06 ENCOUNTER — Ambulatory Visit (INDEPENDENT_AMBULATORY_CARE_PROVIDER_SITE_OTHER): Payer: Federal, State, Local not specified - PPO | Admitting: Otolaryngology

## 2016-12-06 DIAGNOSIS — H906 Mixed conductive and sensorineural hearing loss, bilateral: Secondary | ICD-10-CM

## 2016-12-06 DIAGNOSIS — H6983 Other specified disorders of Eustachian tube, bilateral: Secondary | ICD-10-CM | POA: Diagnosis not present

## 2016-12-29 LAB — VITAMIN D 25 HYDROXY (VIT D DEFICIENCY, FRACTURES): VIT D 25 HYDROXY: 61 ng/mL (ref 30–100)

## 2016-12-29 LAB — LIPID PANEL
CHOL/HDL RATIO: 2.1 (calc) (ref <5.0)
Cholesterol: 179 mg/dL (ref <200)
HDL: 84 mg/dL (ref >50)
LDL Cholesterol (Calc): 82 mg/dL (calc)
NON-HDL CHOLESTEROL (CALC): 95 mg/dL (ref <130)
TRIGLYCERIDES: 51 mg/dL (ref <150)

## 2016-12-29 LAB — BASIC METABOLIC PANEL WITH GFR
BUN: 18 mg/dL (ref 7–25)
CHLORIDE: 103 mmol/L (ref 98–110)
CO2: 29 mmol/L (ref 20–32)
Calcium: 9.3 mg/dL (ref 8.6–10.4)
Creat: 0.83 mg/dL (ref 0.50–0.99)
GFR, EST AFRICAN AMERICAN: 84 mL/min/{1.73_m2} (ref >=60)
GFR, Est Non African American: 72 mL/min/{1.73_m2} (ref >=60)
Glucose, Bld: 71 mg/dL (ref 65–99)
POTASSIUM: 3.6 mmol/L (ref 3.5–5.3)
SODIUM: 140 mmol/L (ref 135–146)

## 2016-12-29 LAB — TSH: TSH: 0.68 m[IU]/L (ref 0.40–4.50)

## 2017-01-02 ENCOUNTER — Encounter (HOSPITAL_COMMUNITY): Payer: Self-pay

## 2017-01-02 ENCOUNTER — Encounter: Payer: Self-pay | Admitting: Family Medicine

## 2017-01-02 ENCOUNTER — Ambulatory Visit (INDEPENDENT_AMBULATORY_CARE_PROVIDER_SITE_OTHER): Payer: Federal, State, Local not specified - PPO | Admitting: Family Medicine

## 2017-01-02 ENCOUNTER — Encounter (HOSPITAL_COMMUNITY): Payer: Federal, State, Local not specified - PPO | Attending: Oncology

## 2017-01-02 VITALS — BP 128/72 | HR 64 | Temp 97.2°F | Resp 16 | Ht 63.0 in | Wt 183.0 lb

## 2017-01-02 VITALS — BP 146/56 | HR 76 | Temp 97.5°F | Resp 16

## 2017-01-02 DIAGNOSIS — Z23 Encounter for immunization: Secondary | ICD-10-CM | POA: Insufficient documentation

## 2017-01-02 DIAGNOSIS — E538 Deficiency of other specified B group vitamins: Secondary | ICD-10-CM | POA: Diagnosis not present

## 2017-01-02 DIAGNOSIS — Z87898 Personal history of other specified conditions: Secondary | ICD-10-CM

## 2017-01-02 DIAGNOSIS — E669 Obesity, unspecified: Secondary | ICD-10-CM | POA: Diagnosis not present

## 2017-01-02 DIAGNOSIS — I10 Essential (primary) hypertension: Secondary | ICD-10-CM

## 2017-01-02 DIAGNOSIS — Z1231 Encounter for screening mammogram for malignant neoplasm of breast: Secondary | ICD-10-CM | POA: Diagnosis not present

## 2017-01-02 MED ORDER — CYANOCOBALAMIN 1000 MCG/ML IJ SOLN
1000.0000 ug | Freq: Once | INTRAMUSCULAR | Status: AC
  Administered 2017-01-02: 1000 ug via INTRAMUSCULAR
  Filled 2017-01-02: qty 1

## 2017-01-02 NOTE — Assessment & Plan Note (Signed)
Controlled, no change in medication DASH diet and commitment to daily physical activity for a minimum of 30 minutes discussed and encouraged, as a part of hypertension management. The importance of attaining a healthy weight is also discussed.  BP/Weight 01/02/2017 01/02/2017 12/03/2016 10/31/2016 10/01/2016 3/60/6770 06/25/350  Systolic BP 481 859 093 112 162 446 950  Diastolic BP 56 72 81 81 78 82 73  Wt. (Lbs) - 183 - - - 184 -  BMI - 32.42 - - - 32.59 -

## 2017-01-02 NOTE — Assessment & Plan Note (Signed)
After obtaining informed consent, the vaccine is  administered by LPN.  

## 2017-01-02 NOTE — Assessment & Plan Note (Signed)
Unchanged Patient re-educated about  the importance of commitment to a  minimum of 150 minutes of exercise per week.  The importance of healthy food choices with portion control discussed. Encouraged to start a food diary, count calories and to consider  joining a support group. Sample diet sheets offered. Goals set by the patient for the next several months.   Weight /BMI 01/02/2017 09/10/2016 08/15/2016  WEIGHT 183 lb 184 lb 184 lb  HEIGHT 5\' 3"  5\' 3"  5\' 3"   BMI 32.42 kg/m2 32.59 kg/m2 32.59 kg/m2  Stop phentermine

## 2017-01-02 NOTE — Progress Notes (Signed)
Patient tolerated vitamin b12 shot with no complaints of pain.  Site clean and dry with no bruising or swelling noted at site.  Band aid applied.  VSS and left ambulatory by self.

## 2017-01-02 NOTE — Patient Instructions (Signed)
[facility]  Discharge Instructions:  You received vitamin b12 shot today.  Call for any questions or concerns.  _______________________________________________________________  Thank you for choosing [place] [facility] to provide your oncology and hematology care.  To afford each patient quality time with our providers, please arrive at least 15 minutes before your scheduled appointment.  You need to re-schedule your appointment if you arrive 10 or more minutes late.  We strive to give you quality time with our providers, and arriving late affects you and other patients whose appointments are after yours.  Also, if you no show three or more times for appointments you may be dismissed from the clinic.  Again, thank you for choosing [facility] [place] hope is that these requests will allow you access to exceptional care and in a timely manner. _______________________________________________________________  If you have questions after your visit, please contact our office at [phone] between the hours of 8:30 a.m. and 5:00 p.m. Voicemails left after 4:30 p.m. will not be returned until the following business day. _______________________________________________________________  For prescription refill requests, have your pharmacy contact our office. _______________________________________________________________  Recommendations made by the consultant and any test results will be sent to your referring physician. _______________________________________________________________

## 2017-01-02 NOTE — Addendum Note (Signed)
Addended by: Amado Coe on: 01/02/2017 01:39 PM   Modules accepted: Orders

## 2017-01-02 NOTE — Patient Instructions (Signed)
Physical exam end  January , call if you need me before  Mammogram to be scheduled at checkout  Stop phentermine and focus on healthy lifestyle   Flu vaccine today  Continue to use tylenol for pain and do not lift excessive weights!  It is important that you exercise regularly at least 30 minutes 5 times a week. If you develop chest pain, have severe difficulty breathing, or feel very tired, stop exercising immediately and seek medical attention  Please work on good  health habits so that your health will improve. 1. Commitment to daily physical activity for 30 to 60  minutes, if you are able to do this.  2. Commitment to wise food choices. Aim for half of your  food intake to be vegetable and fruit, one quarter starchy foods, and one quarter protein. Try to eat on a regular schedule  3 meals per day, snacking between meals should be limited to vegetables or fruits or small portions of nuts. 64 ounces of water per day is generally recommended, unless you have specific health conditions, like heart failure or kidney failure where you will need to limit fluid intake.  3. Commitment to sufficient and a  good quality of physical and mental rest daily, generally between 6 to 8 hours per day.  WITH PERSISTANCE AND PERSEVERANCE, THE IMPOSSIBLE , BECOMES THE NORM!  Thank you  for choosing [facility]. We consider it a privelige to serve you.  Delivering excellent health care in a caring and  compassionate way is our goal.  Partnering with you,  so that together we can achieve this goal is our strategy.

## 2017-01-02 NOTE — Progress Notes (Signed)
   Sheri Jones     MRN: [id number]      DOB: 1945-07-30   HPI Sheri Jones is here for follow up and re-evaluation of chronic medical conditions, medication management and review of any available recent lab and radiology data.  Preventive health is updated, specifically  Cancer screening and Immunization.   Questions or concerns regarding consultations or procedures which the PT has had in the interim are  addressed. The PT denies any adverse reactions to current medications since the last visit. No weight loss with phentermine will d/c same  Sprained right arm 1 week ago lifting water, pain was as much as an 8, getting better gradually Inconsistent exercise , working on food choice   ROS Denies recent fever or chills. Denies sinus pressure, nasal congestion, ear pain or sore throat. Denies chest congestion, productive cough or wheezing. Denies chest pains, palpitations and leg swelling Denies abdominal pain, nausea, vomiting,diarrhea or constipation.   Denies dysuria, frequency, hesitancy or incontinence.  Denies headaches, seizures, numbness, or tingling. Denies depression, anxiety or insomnia. Denies skin break down or rash.   PE  BP 128/72   Pulse 64   Temp (!) 97.2 F (36.2 C) (Other (Comment))   Resp 16   Ht 5\' 3"  (1.6 m)   Wt 183 lb (83 kg)   SpO2 98%   BMI 32.42 kg/m   Patient alert and oriented and in no cardiopulmonary distress.  HEENT: No facial asymmetry, EOMI,   oropharynx pink and moist.  Neck supple no JVD, no mass.  Chest: Clear to auscultation bilaterally.  CVS: S1, S2 no murmurs, no S3.Regular rate.  ABD: Soft non tender.   Ext: No edema  MS: Adequate ROM spine, shoulders, hips and knees.  Skin: Intact, no ulcerations or rash noted.  Psych: Good eye contact, normal affect. Memory intact not anxious or depressed appearing.  CNS: CN 2-12 intact, power,  normal throughout.no focal deficits noted.   Assessment &  Plan  Obesity Unchanged Patient re-educated about  the importance of commitment to a  minimum of 150 minutes of exercise per week.  The importance of healthy food choices with portion control discussed. Encouraged to start a food diary, count calories and to consider  joining a support group. Sample diet sheets offered. Goals set by the patient for the next several months.   Weight /BMI 01/02/2017 09/10/2016 08/15/2016  WEIGHT 183 lb 184 lb 184 lb  HEIGHT 5\' 3"  5\' 3"  5\' 3"   BMI 32.42 kg/m2 32.59 kg/m2 32.59 kg/m2  Stop phentermine    Essential hypertension Controlled, no change in medication DASH diet and commitment to daily physical activity for a minimum of 30 minutes discussed and encouraged, as a part of hypertension management. The importance of attaining a healthy weight is also discussed.  BP/Weight 01/02/2017 01/02/2017 12/03/2016 10/31/2016 10/01/2016 09/17/7822 05/27/5359  Systolic BP 443 154 008 676 195 093 267  Diastolic BP 56 72 81 81 78 82 73  Wt. (Lbs) - 183 - - - 184 -  BMI - 32.42 - - - 32.59 -       Need for influenza vaccination After obtaining informed consent, the vaccine is  administered by LPN.

## 2017-01-03 ENCOUNTER — Ambulatory Visit (HOSPITAL_COMMUNITY): Payer: Federal, State, Local not specified - PPO

## 2017-01-04 ENCOUNTER — Other Ambulatory Visit: Payer: Self-pay | Admitting: Family Medicine

## 2017-01-07 ENCOUNTER — Other Ambulatory Visit: Payer: Self-pay | Admitting: Family Medicine

## 2017-01-07 DIAGNOSIS — R928 Other abnormal and inconclusive findings on diagnostic imaging of breast: Secondary | ICD-10-CM

## 2017-01-08 ENCOUNTER — Ambulatory Visit (HOSPITAL_COMMUNITY)
Admission: RE | Admit: 2017-01-08 | Discharge: 2017-01-08 | Disposition: A | Discharge status: Home / Self Care | Payer: Federal, State, Local not specified - PPO | Source: Ambulatory Visit | Attending: Family Medicine | Admitting: Family Medicine

## 2017-01-08 DIAGNOSIS — Z87898 Personal history of other specified conditions: Secondary | ICD-10-CM | POA: Diagnosis present

## 2017-01-08 DIAGNOSIS — N649 Disorder of breast, unspecified: Secondary | ICD-10-CM | POA: Diagnosis present

## 2017-01-14 LAB — HM DIABETES EYE EXAM

## 2017-01-20 IMAGING — CR DG KNEE 1-2V PORT*R*
1 series · 2 of 2 positions shown · non-contrast
Comparison: 01/06/2015

CLINICAL DATA: Post right knee replaced

EXAM:
PORTABLE RIGHT KNEE - 1-2 VIEW

[Series 2: ap · 0.17mm/px · 2 of 2 slices shown]
[im 1/2]
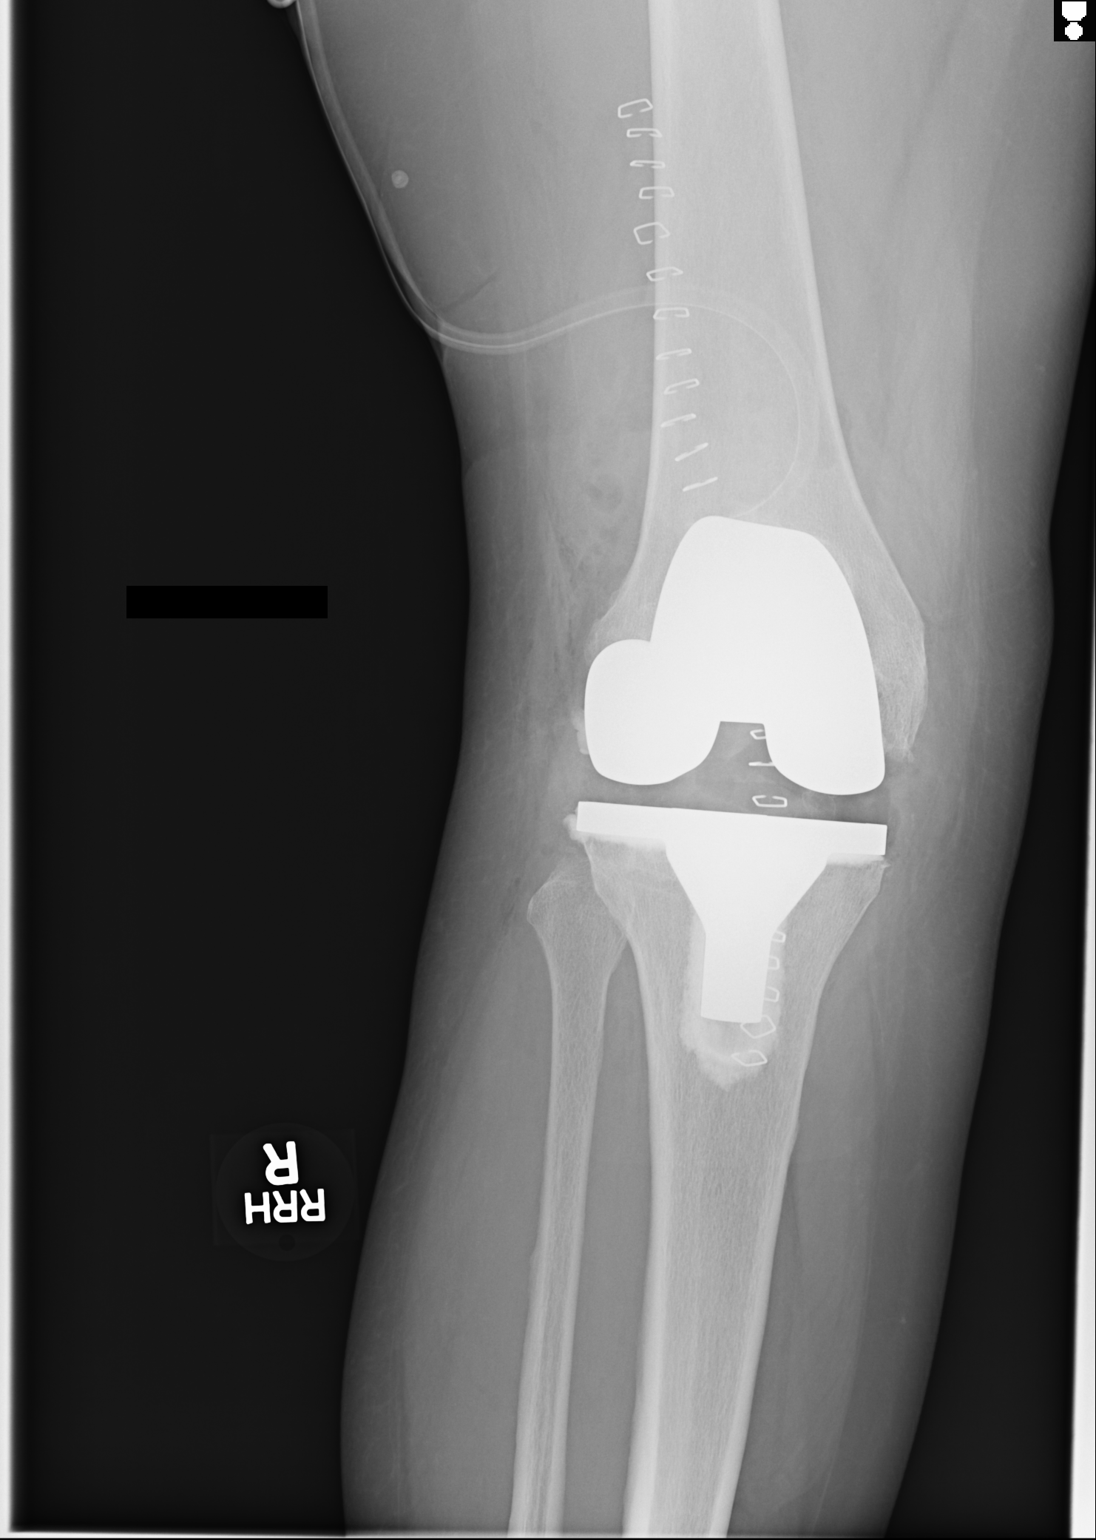
[im 2/2]
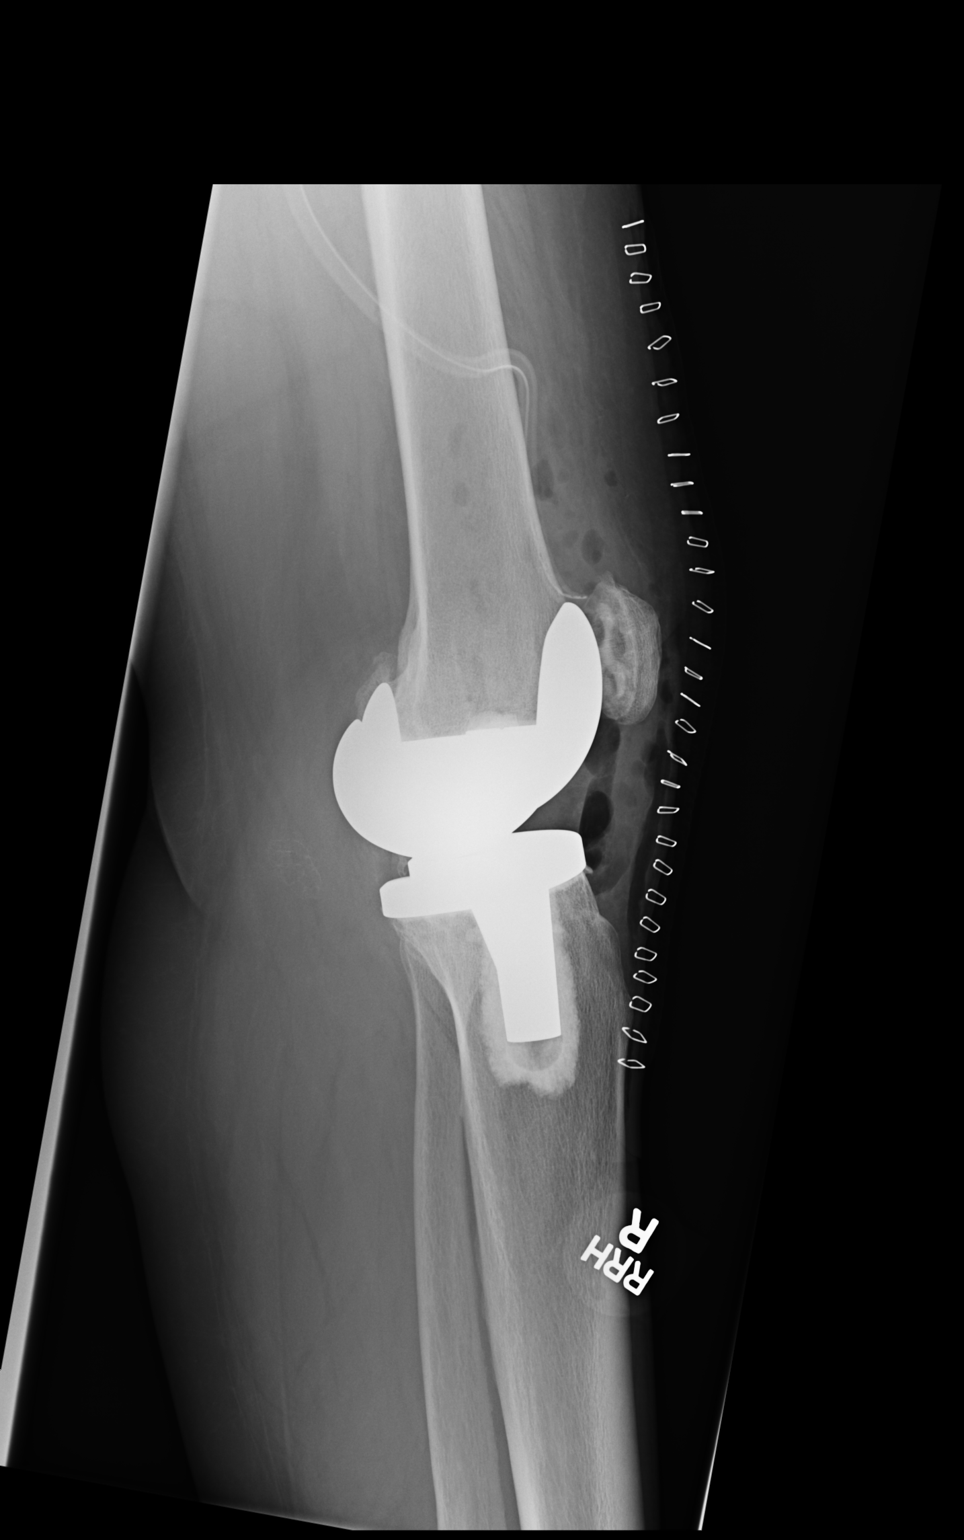

[2 of 2 positions shown; findings below may reference images not displayed]

FINDINGS: Two views of the right knee submitted. There is right knee
prosthesis with anatomic alignment. Postsurgical changes with
midline skin staples. Postsurgical drain in place. Small amount of
periarticular soft tissue air.
IMPRESSION: Right knee prosthesis with anatomic alignment. Postsurgical changes
are noted.

## 2017-02-04 ENCOUNTER — Encounter (HOSPITAL_COMMUNITY): Payer: Federal, State, Local not specified - PPO | Attending: Oncology

## 2017-02-04 ENCOUNTER — Encounter (HOSPITAL_COMMUNITY): Payer: Self-pay

## 2017-02-04 VITALS — BP 108/68 | HR 66 | Resp 16

## 2017-02-04 DIAGNOSIS — E538 Deficiency of other specified B group vitamins: Secondary | ICD-10-CM | POA: Diagnosis not present

## 2017-02-04 MED ORDER — CYANOCOBALAMIN 1000 MCG/ML IJ SOLN
1000.0000 ug | Freq: Once | INTRAMUSCULAR | Status: AC
  Administered 2017-02-04: 1000 ug via INTRAMUSCULAR

## 2017-02-04 MED ORDER — CYANOCOBALAMIN 1000 MCG/ML IJ SOLN
INTRAMUSCULAR | Status: AC
  Filled 2017-02-04: qty 1

## 2017-02-04 NOTE — Patient Instructions (Signed)
[facility] at [facility] Discharge Instructions  RECOMMENDATIONS MADE BY THE CONSULTANT AND ANY TEST RESULTS WILL BE SENT TO YOUR REFERRING PHYSICIAN.  B12 injection today Follow up as scheduled.  Thank you for choosing [facility] at [facility] to provide your oncology and hematology care.  To afford each patient quality time with our provider, please arrive at least 15 minutes before your scheduled appointment time.    If you have a lab appointment with the Cancer Center please come in thru the  Main Entrance and check in at the main information desk  You need to re-schedule your appointment should you arrive 10 or more minutes late.  We strive to give you quality time with our providers, and arriving late affects you and other patients whose appointments are after yours.  Also, if you no show three or more times for appointments you may be dismissed from the clinic at the providers discretion.     Again, thank you for choosing [facility].  Our hope is that these requests will decrease the amount of time that you wait before being seen by our physicians.       _____________________________________________________________  Should you have questions after your visit to [facility], please contact our office at [phone] between the hours of 8:30 a.m. and 4:30 p.m.  Voicemails left after 4:30 p.m. will not be returned until the following business day.  For prescription refill requests, have your pharmacy contact our office.       Resources For Cancer Patients and their Caregivers ? American Cancer Society: Can assist with transportation, wigs, general needs, runs Look Good Feel Better.        1-888-227-6333 ? Cancer Care: Provides financial assistance, online support groups, medication/co-pay assistance.  1-800-813-HOPE (4673) ? [facility] Assists [place] cancer patients and  their families through emotional , educational and financial support.  336-427-4357 ? [place] DSS Where to apply for food stamps, Medicaid and utility assistance. 336-342-1394 ? [facility]: Transportation to medical appointments. 336-347-2287 ? Social Security Administration: May apply for disability if have a Stage IV cancer. 336-342-7796 1-800-772-1213 ? [place] Aging, Disability and Transit Services: Assists with nutrition, care and transit needs. 336-349-2343  Cancer Center Support Programs: [email or web address]@ > Cancer Support Group  2nd Tuesday of the month 1pm-2pm, Journey Room  > Creative Journey  3rd Tuesday of the month 1130am-1pm, Journey Room  > Look Good Feel Better  1st Wednesday of the month 10am-12 noon, Journey Room (Call American Cancer Society to register 1-800-395-5775)   

## 2017-02-04 NOTE — Progress Notes (Signed)
Sheri Jones presents today for injection per MD orders. B12 1,023mcg administered IM in right Gluteal. Administration without incident. Patient tolerated well.   Treatment given per orders. Patient tolerated it well without problems. Vitals stable and discharged home from clinic ambulatory. Follow up as scheduled.

## 2017-03-07 ENCOUNTER — Encounter (HOSPITAL_COMMUNITY): Payer: Self-pay

## 2017-03-07 ENCOUNTER — Encounter (HOSPITAL_COMMUNITY): Payer: Federal, State, Local not specified - PPO | Attending: Oncology

## 2017-03-07 VITALS — HR 66 | Temp 97.8°F | Resp 18

## 2017-03-07 DIAGNOSIS — E538 Deficiency of other specified B group vitamins: Secondary | ICD-10-CM | POA: Insufficient documentation

## 2017-03-07 MED ORDER — CYANOCOBALAMIN 1000 MCG/ML IJ SOLN
1000.0000 ug | Freq: Once | INTRAMUSCULAR | Status: AC
  Administered 2017-03-07: 1000 ug via INTRAMUSCULAR

## 2017-03-07 MED ORDER — CYANOCOBALAMIN 1000 MCG/ML IJ SOLN
INTRAMUSCULAR | Status: AC
Start: 2017-03-07 — End: ?
  Filled 2017-03-07: qty 1

## 2017-03-07 NOTE — Progress Notes (Signed)
Patient tolerated b12 shot with no complaints voiced.  Injection site clean and dry with no bruising or swelling noted at site.  Band aid applied.  VSS with discharge and left ambulatory with no s/s of distress noted.   

## 2017-03-07 NOTE — Patient Instructions (Signed)
[facility]  Discharge Instructions:   _______________________________________________________________  Thank you for choosing [facility] at [facility] to provide your oncology and hematology care.  To afford each patient quality time with our providers, please arrive at least 15 minutes before your scheduled appointment.  You need to re-schedule your appointment if you arrive 10 or more minutes late.  We strive to give you quality time with our providers, and arriving late affects you and other patients whose appointments are after yours.  Also, if you no show three or more times for appointments you may be dismissed from the clinic.  Again, thank you for choosing [facility]. Our hope is that these requests will allow you access to exceptional care and in a timely manner. _______________________________________________________________  If you have questions after your visit, please contact our office at [phone] between the hours of 8:30 a.m. and 5:00 p.m. Voicemails left after 4:30 p.m. will not be returned until the following business day. _______________________________________________________________  For prescription refill requests, have your pharmacy contact our office. _______________________________________________________________  Recommendations made by the consultant and any test results will be sent to your referring physician. _______________________________________________________________ 

## 2017-03-20 ENCOUNTER — Ambulatory Visit (INDEPENDENT_AMBULATORY_CARE_PROVIDER_SITE_OTHER): Payer: Federal, State, Local not specified - PPO | Admitting: Orthopedic Surgery

## 2017-03-20 ENCOUNTER — Ambulatory Visit (INDEPENDENT_AMBULATORY_CARE_PROVIDER_SITE_OTHER): Payer: Federal, State, Local not specified - PPO

## 2017-03-20 ENCOUNTER — Encounter: Payer: Self-pay | Admitting: Orthopedic Surgery

## 2017-03-20 VITALS — BP 125/75 | HR 70 | Ht 63.0 in | Wt 183.0 lb

## 2017-03-20 DIAGNOSIS — Z96651 Presence of right artificial knee joint: Secondary | ICD-10-CM | POA: Diagnosis not present

## 2017-03-20 MED ORDER — MELOXICAM 15 MG PO TABS: 15.0000 mg | ORAL_TABLET | Freq: Every day | ORAL | 2 refills | Status: DC

## 2017-03-20 NOTE — Progress Notes (Signed)
Progress Note   Patient ID: Sheri Jones, female   DOB: 05/05/1945, 71 y.o.   MRN: [id number]  Chief Complaint  Patient presents with  . Post-op Follow-up    right total knee replacement 02/23/15    71 year old female presents for 2-year follow-up after right total knee     Review of Systems  Constitutional: Negative for fever.  Neurological: Negative for tingling and sensory change.   Current Meds  Medication Sig  . aspirin 81 MG tablet Take 81 mg by mouth daily.  . Calcium-Vitamin D 500-125 MG-UNIT TABS Take 1 tablet by mouth daily.   . cetirizine (ZYRTEC) 10 MG tablet Take 10 mg by mouth daily. Reported on 10/24/2015  . cyanocobalamin (,VITAMIN B-12,) 1000 MCG/ML injection Inject 1,000 mcg into the muscle every 30 (thirty) days.  . diphenhydrAMINE (BENADRYL) 25 mg capsule Take 25 mg by mouth every 6 (six) hours as needed.  . hydrochlorothiazide (HYDRODIURIL) 25 MG tablet TAKE 1 TABLET BY MOUTH DAILY  . meclizine (ANTIVERT) 25 MG tablet Take 1 tablet (25 mg total) by mouth 3 (three) times daily as needed for dizziness.  . meloxicam (MOBIC) 15 MG tablet Take 15 mg by mouth daily.  . Multiple Vitamins-Minerals (CENTRUM ULTRA WOMENS PO) Take 1 tablet by mouth daily.    . potassium chloride (KLOR-CON 10) 10 MEQ tablet Take 1 tablet (10 mEq total) by mouth 2 (two) times daily.  . Vitamin D, Ergocalciferol, (DRISDOL) 50000 units CAPS capsule Take 1 capsule (50,000 Units total) by mouth once a week.  . Vitamin D, Ergocalciferol, (DRISDOL) 50000 units CAPS capsule TAKE ONE CAPSULE BY MOUTH ONCE A WEEK    Allergies  Allergen Reactions  . Eggs Or Egg-Derived Products Diarrhea  . Milk-Related Compounds     Lactose intolerant      BP 125/75   Pulse 70   Ht 5\' 3"  (1.6 m)   Wt 183 lb (83 kg)   BMI 32.42 kg/m   Physical Exam  Constitutional: She is oriented to person, place, and time. She appears well-developed and well-nourished.  Neurological: She is alert and oriented to  person, place, and time.  Psychiatric: She has a normal mood and affect. Judgment normal.  Vitals reviewed.  Right knee 0-90 stable normal strength  Medical decision-making Encounter Diagnosis  Name Primary?  . 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 meloxicam (MOBIC) 15 MG tablet    Sig: Take 1 tablet (15 mg total) by mouth daily.    Dispense:  30 tablet    Refill:  2   Follow-up 1 year  [name], MD 03/20/2017 11:29 AM

## 2017-04-02 ENCOUNTER — Other Ambulatory Visit: Payer: Self-pay

## 2017-04-02 ENCOUNTER — Telehealth: Payer: Self-pay | Admitting: Family Medicine

## 2017-04-02 MED ORDER — POTASSIUM CHLORIDE ER 10 MEQ PO TBCR: 10.0000 meq | EXTENDED_RELEASE_TABLET | Freq: Two times a day (BID) | ORAL | 1 refills | Status: DC

## 2017-04-02 NOTE — Telephone Encounter (Signed)
Med refilled.

## 2017-04-02 NOTE — Telephone Encounter (Signed)
Patient is requesting refill for KLOR-CON Cb#: [phone]

## 2017-04-02 NOTE — Telephone Encounter (Signed)
[facility] in [place]

## 2017-04-05 ENCOUNTER — Other Ambulatory Visit: Payer: Self-pay

## 2017-04-05 ENCOUNTER — Encounter (HOSPITAL_COMMUNITY): Payer: Federal, State, Local not specified - PPO | Attending: Oncology

## 2017-04-05 ENCOUNTER — Encounter (HOSPITAL_COMMUNITY): Payer: Self-pay

## 2017-04-05 VITALS — BP 121/63 | HR 72 | Temp 98.0°F | Resp 18

## 2017-04-05 DIAGNOSIS — E538 Deficiency of other specified B group vitamins: Secondary | ICD-10-CM | POA: Diagnosis not present

## 2017-04-05 MED ORDER — CYANOCOBALAMIN 1000 MCG/ML IJ SOLN
1000.0000 ug | Freq: Once | INTRAMUSCULAR | Status: AC
  Administered 2017-04-05: 1000 ug via INTRAMUSCULAR

## 2017-04-05 NOTE — Patient Instructions (Signed)
[facility] at [facility] Discharge Instructions  RECOMMENDATIONS MADE BY THE CONSULTANT AND ANY TEST RESULTS WILL BE SENT TO YOUR REFERRING PHYSICIAN.  You had your B-12 injection today Follow up as scheduled.  Thank you for choosing [facility] at [facility] to provide your oncology and hematology care.  To afford each patient quality time with our provider, please arrive at least 15 minutes before your scheduled appointment time.    If you have a lab appointment with the Cancer Center please come in thru the  Main Entrance and check in at the main information desk  You need to re-schedule your appointment should you arrive 10 or more minutes late.  We strive to give you quality time with our providers, and arriving late affects you and other patients whose appointments are after yours.  Also, if you no show three or more times for appointments you may be dismissed from the clinic at the providers discretion.     Again, thank you for choosing [facility].  Our hope is that these requests will decrease the amount of time that you wait before being seen by our physicians.       _____________________________________________________________  Should you have questions after your visit to [facility], please contact our office at [phone] between the hours of 8:30 a.m. and 4:30 p.m.  Voicemails left after 4:30 p.m. will not be returned until the following business day.  For prescription refill requests, have your pharmacy contact our office.       Resources For Cancer Patients and their Caregivers ? American Cancer Society: Can assist with transportation, wigs, general needs, runs Look Good Feel Better.        1-888-227-6333 ? Cancer Care: Provides financial assistance, online support groups, medication/co-pay assistance.  1-800-813-HOPE (4673) ? [facility] Assists [place] cancer  patients and their families through emotional , educational and financial support.  336-427-4357 ? [place] DSS Where to apply for food stamps, Medicaid and utility assistance. 336-342-1394 ? [facility]: Transportation to medical appointments. 336-347-2287 ? Social Security Administration: May apply for disability if have a Stage IV cancer. 336-342-7796 1-800-772-1213 ? [place] Aging, Disability and Transit Services: Assists with nutrition, care and transit needs. 336-349-2343  Cancer Center Support Programs: @[email or web address]@ > Cancer Support Group  2nd Tuesday of the month 1pm-2pm, Journey Room  > Creative Journey  3rd Tuesday of the month 1130am-1pm, Journey Room  > Look Good Feel Better  1st Wednesday of the month 10am-12 noon, Journey Room (Call American Cancer Society to register 1-800-395-5775)    

## 2017-04-05 NOTE — Progress Notes (Signed)
Sheri Jones presents today for injection per MD orders. B12 1000 mcg administered IM in left deltoid. Administration without incident. Patient tolerated well. Patient tolerated treatment without incidence. Patient discharged ambulatory and in stable condition from clinic. Patient to follow up as scheduled.

## 2017-04-29 ENCOUNTER — Encounter: Payer: Federal, State, Local not specified - PPO | Admitting: Family Medicine

## 2017-05-06 ENCOUNTER — Encounter (HOSPITAL_COMMUNITY): Payer: Self-pay

## 2017-05-06 ENCOUNTER — Inpatient Hospital Stay (HOSPITAL_COMMUNITY): Payer: Federal, State, Local not specified - PPO | Attending: Oncology

## 2017-05-06 VITALS — BP 141/64 | HR 70 | Temp 97.8°F | Resp 18

## 2017-05-06 DIAGNOSIS — E538 Deficiency of other specified B group vitamins: Secondary | ICD-10-CM | POA: Insufficient documentation

## 2017-05-06 MED ORDER — CYANOCOBALAMIN 1000 MCG/ML IJ SOLN
1000.0000 ug | Freq: Once | INTRAMUSCULAR | Status: AC
  Administered 2017-05-06: 1000 ug via INTRAMUSCULAR
  Filled 2017-05-06: qty 1

## 2017-05-06 NOTE — Patient Instructions (Signed)
[facility] at [facility]  Discharge Instructions:  You received a b12 shot today.  _______________________________________________________________  Thank you for choosing [facility] at [facility] to provide your oncology and hematology care.  To afford each patient quality time with our providers, please arrive at least 15 minutes before your scheduled appointment.  You need to re-schedule your appointment if you arrive 10 or more minutes late.  We strive to give you quality time with our providers, and arriving late affects you and other patients whose appointments are after yours.  Also, if you no show three or more times for appointments you may be dismissed from the clinic.  Again, thank you for choosing [facility] at [facility]. Our hope is that these requests will allow you access to exceptional care and in a timely manner. _______________________________________________________________  If you have questions after your visit, please contact our office at [phone] between the hours of 8:30 a.m. and 5:00 p.m. Voicemails left after 4:30 p.m. will not be returned until the following business day. _______________________________________________________________  For prescription refill requests, have your pharmacy contact our office. _______________________________________________________________  Recommendations made by the consultant and any test results will be sent to your referring physician. _______________________________________________________________ 

## 2017-05-06 NOTE — Progress Notes (Signed)
Patient tolerated vitamin b12 shot with no complaints voiced.  Injection site clean and dry with no bruising or swelling noted at site.  Band aid applied.  VSS with discharge and left ambulatory with no s/s of distress noted.  

## 2017-06-06 ENCOUNTER — Inpatient Hospital Stay (HOSPITAL_COMMUNITY): Payer: Federal, State, Local not specified - PPO

## 2017-06-06 ENCOUNTER — Encounter (HOSPITAL_COMMUNITY): Payer: Self-pay | Admitting: Adult Health

## 2017-06-06 ENCOUNTER — Inpatient Hospital Stay (HOSPITAL_COMMUNITY): Payer: Federal, State, Local not specified - PPO | Attending: Internal Medicine | Admitting: Adult Health

## 2017-06-06 VITALS — BP 122/64 | HR 74 | Temp 95.7°F | Resp 18 | Wt 185.6 lb

## 2017-06-06 DIAGNOSIS — E538 Deficiency of other specified B group vitamins: Secondary | ICD-10-CM

## 2017-06-06 DIAGNOSIS — Z9884 Bariatric surgery status: Secondary | ICD-10-CM

## 2017-06-06 LAB — CBC WITH DIFFERENTIAL/PLATELET
BASOS ABS: 0 10*3/uL (ref 0.0–0.1)
BASOS PCT: 1 %
EOS PCT: 7 %
Eosinophils Absolute: 0.3 10*3/uL (ref 0.0–0.7)
HCT: 39.9 % (ref 36.0–46.0)
Hemoglobin: 13.3 g/dL (ref 12.0–15.0)
LYMPHS PCT: 45 %
Lymphs Abs: 1.9 10*3/uL (ref 0.7–4.0)
MCH: 30.9 pg (ref 26.0–34.0)
MCHC: 33.3 g/dL (ref 30.0–36.0)
MCV: 92.8 fL (ref 78.0–100.0)
Monocytes Absolute: 0.3 10*3/uL (ref 0.1–1.0)
Monocytes Relative: 7 %
Neutro Abs: 1.6 10*3/uL — ABNORMAL LOW (ref 1.7–7.7)
Neutrophils Relative %: 40 %
PLATELETS: 163 10*3/uL (ref 150–400)
RBC: 4.3 MIL/uL (ref 3.87–5.11)
RDW: 14.7 % (ref 11.5–15.5)
WBC: 4.1 10*3/uL (ref 4.0–10.5)

## 2017-06-06 LAB — IRON AND TIBC
Iron: 84 ug/dL (ref 28–170)
Saturation Ratios: 25 % (ref 10.4–31.8)
TIBC: 337 ug/dL (ref 250–450)
UIBC: 253 ug/dL

## 2017-06-06 LAB — FOLATE: FOLATE: 59.7 ng/mL (ref >5.9)

## 2017-06-06 LAB — FERRITIN: FERRITIN: 40 ng/mL (ref 11–307)

## 2017-06-06 LAB — VITAMIN B12: VITAMIN B 12: 940 pg/mL — AB (ref 180–914)

## 2017-06-06 MED ORDER — CYANOCOBALAMIN 1000 MCG/ML IJ SOLN
1000.0000 ug | Freq: Once | INTRAMUSCULAR | Status: AC
  Administered 2017-06-06: 1000 ug via INTRAMUSCULAR
  Filled 2017-06-06: qty 1

## 2017-06-06 NOTE — Progress Notes (Signed)
Sheri Jones presents today for injection per the provider's orders.  B12 administration without incident; see MAR for injection details.  Patient tolerated procedure well and without incident.  No questions or complaints noted at this time.  Discharged ambulatory.  

## 2017-06-06 NOTE — Progress Notes (Signed)
[facility] [place], [place] [address]   CLINIC:  Medical Oncology/Hematology  PCP:  Fayrene Helper, MD [address], [address] [place] [address] [phone]   REASON FOR VISIT:  Follow-up for Vitamin B12 deficiency   CURRENT THERAPY: Vitamin B12 inj monthly     HISTORY OF PRESENT ILLNESS:  (From Kirby Crigler, PA-C's note on 06/05/16)     INTERVAL HISTORY:  Ms. Sanagustin 72 y.o. female returns for routine annual follow-up for vitamin B12 deficiency.   Here today unaccompanied.  Overall she tells me she has been feeling very well.  Appetite and energy levels with 100%.  She has started exercising again by walking regularly; she is noticed that this is helping her energy levels.  She denies any bleeding episodes, nausea or vomiting, constipation, or diarrhea.  Denies any chest pain or shortness of breath.  Denies any abnormal bruising.  She does share with me that unfortunately her mother passed away last month; offered my condolences for her loss.  Otherwise she is largely without complaints today.    REVIEW OF SYSTEMS:  Review of Systems  Constitutional: Negative.  Negative for chills, fatigue and fever.  HENT:  Negative.  Negative for lump/mass and nosebleeds.   Eyes: Negative.   Respiratory: Negative.  Negative for cough and shortness of breath.   Cardiovascular: Negative.  Negative for chest pain and leg swelling.  Gastrointestinal: Negative.  Negative for abdominal pain, blood in stool, constipation, diarrhea, nausea and vomiting.  Endocrine: Negative.   Genitourinary: Negative.  Negative for dysuria and hematuria.   Musculoskeletal: Negative.  Negative for arthralgias.  Skin: Positive for itching (Occasionally). Negative for rash.  Neurological: Negative.  Negative for dizziness and headaches.  Hematological: Negative.  Negative for adenopathy. Does not bruise/bleed easily.  Psychiatric/Behavioral: Negative.  Negative for  depression and sleep disturbance. The patient is not nervous/anxious.      PAST MEDICAL/SURGICAL HISTORY:  Past Medical History:  Diagnosis Date  . Allergic rhinitis due to pollen   . Anemia   . Diabetes mellitus without complication ([place])   . 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 Obesity, unspecified   . Osteoarthrosis, unspecified whether generalized or localized, lower leg   . Unspecified essential hypertension   . Vitamin B12 deficiency disease 10/28/2008   Qualifier: Diagnosis of  By: Moshe Cipro MD, Joycelyn Schmid     Past Surgical History:  Procedure Laterality Date  . COLONOSCOPY  2008  . EXAM UNDER ANESTHESIA WITH MANIPULATION OF KNEE Right 05/11/2015   Procedure: EXAM UNDER ANESTHESIA WITH MANIPULATION OF KNEE;  Surgeon: Carole Civil, MD;  Location: [facility];  Service: Orthopedics;  Laterality: Right;  . GASTRIC BYPASS  2004  . TOTAL KNEE ARTHROPLASTY Right 02/23/2015   Procedure: RIGHT TOTAL KNEE ARTHROPLASTY;  Surgeon: Carole Civil, MD;  Location: [facility];  Service: Orthopedics;  Laterality: Right;     SOCIAL HISTORY:  Social History   Socioeconomic History  . Marital status: Single    Spouse name: Not on file  . Number of children: Not on file  . Years of education: Not on file  . Highest education level: Not on file  Social Needs  . Financial resource strain: Not on file  . Food insecurity - worry: Not on file  . Food insecurity - inability: Not on file  . Transportation needs - medical: Not on file  . Transportation needs - non-medical: Not on file  Occupational History  .  Not on file  Tobacco Use  . Smoking status: Never Smoker  . Smokeless tobacco: Never Used  Substance and Sexual Activity  . Alcohol use: No  . Drug use: No  . Sexual activity: Not Currently  Other Topics Concern  . Not on file  Social History Narrative  . Not on file    FAMILY HISTORY:  Family History  Problem Relation Age of Onset  . Hypertension Mother   .  Hypertension Sister   . Hypertension Sister   . Stroke Sister 87  . Hypertension Sister   . Heart disease Father 102       massive heart attack   . Breast cancer Maternal Aunt   . Lung cancer Maternal Aunt     CURRENT MEDICATIONS:  Outpatient Encounter Medications as of 06/06/2017  Medication Sig  . aspirin 81 MG tablet Take 81 mg by mouth daily.  . Calcium-Vitamin D 500-125 MG-UNIT TABS Take 1 tablet by mouth daily.   . cetirizine (ZYRTEC) 10 MG tablet Take 10 mg by mouth daily. Reported on 10/24/2015  . cyanocobalamin (,VITAMIN B-12,) 1000 MCG/ML injection Inject 1,000 mcg into the muscle every 30 (thirty) days.  . diphenhydrAMINE (BENADRYL) 25 mg capsule Take 25 mg by mouth every 6 (six) hours as needed.  . hydrochlorothiazide (HYDRODIURIL) 25 MG tablet TAKE 1 TABLET BY MOUTH DAILY  . meclizine (ANTIVERT) 25 MG tablet Take 1 tablet (25 mg total) by mouth 3 (three) times daily as needed for dizziness.  . meloxicam (MOBIC) 15 MG tablet Take 1 tablet (15 mg total) by mouth daily.  . Multiple Vitamins-Minerals (CENTRUM ULTRA WOMENS PO) Take 1 tablet by mouth daily.    . potassium chloride (KLOR-CON 10) 10 MEQ tablet Take 1 tablet (10 mEq total) by mouth 2 (two) times daily.  . Vitamin D, Ergocalciferol, (DRISDOL) 50000 units CAPS capsule Take 1 capsule (50,000 Units total) by mouth once a week.  . [DISCONTINUED] Vitamin D, Ergocalciferol, (DRISDOL) 50000 units CAPS capsule TAKE ONE CAPSULE BY MOUTH ONCE A WEEK   No facility-administered encounter medications on file as of 06/06/2017.     ALLERGIES:  Allergies  Allergen Reactions  . Eggs Or Egg-Derived Products Diarrhea  . Milk-Related Compounds     Lactose intolerant      PHYSICAL EXAM:  ECOG Performance status: 0 - Asymptomatic   Vitals:   06/06/17 1011  BP: 122/64  Pulse: 74  Resp: 18  Temp: (!) 95.7 F (35.4 C)  SpO2: 100%   Filed Weights   06/06/17 1011  Weight: 185 lb 9.6 oz (84.2 kg)    Physical Exam    Constitutional: She is oriented to person, place, and time and well-developed, well-nourished, and in no distress.  HENT:  Head: Normocephalic.  Mouth/Throat: Oropharynx is clear and moist. No oropharyngeal exudate.  Eyes: Conjunctivae are normal. Pupils are equal, round, and reactive to light. No scleral icterus.  Neck: Normal range of motion. Neck supple.  Cardiovascular: Normal rate and regular rhythm.  Pulmonary/Chest: Effort normal and breath sounds normal. No respiratory distress.  Abdominal: Soft. Bowel sounds are normal. There is no tenderness.  Musculoskeletal: Normal range of motion. She exhibits no edema.  Lymphadenopathy:    She has no cervical adenopathy.       Right: No supraclavicular adenopathy present.       Left: No supraclavicular adenopathy present.  Neurological: She is alert and oriented to person, place, and time. No cranial nerve deficit. Gait normal.  Skin: Skin is warm  and dry. No rash noted.  Psychiatric: Mood, memory, affect and judgment normal.  Nursing note and vitals reviewed.    LABORATORY DATA:  I have reviewed the labs as listed.  CBC    Component Value Date/Time   WBC 4.1 06/06/2017 0944   RBC 4.30 06/06/2017 0944   HGB 13.3 06/06/2017 0944   HCT 39.9 06/06/2017 0944   PLT 163 06/06/2017 0944   MCV 92.8 06/06/2017 0944   MCH 30.9 06/06/2017 0944   MCHC 33.3 06/06/2017 0944   RDW 14.7 06/06/2017 0944   LYMPHSABS 1.9 06/06/2017 0944   MONOABS 0.3 06/06/2017 0944   EOSABS 0.3 06/06/2017 0944   BASOSABS 0.0 06/06/2017 0944   CMP Latest Ref Rng & Units 12/28/2016 04/11/2016 11/29/2015  Glucose 65 - 99 mg/dL 71 82 83  BUN 7 - 25 mg/dL 18 16 16   Creatinine 0.50 - 0.99 mg/dL 0.83 0.93 0.99  Sodium 135 - 146 mmol/L 140 142 140  Potassium 3.5 - 5.3 mmol/L 3.6 4.2 4.2  Chloride 98 - 110 mmol/L 103 104 106  CO2 20 - 32 mmol/L 29 31 26   Calcium 8.6 - 10.4 mg/dL 9.3 9.1 9.1  Total Protein 6.1 - 8.1 g/dL - - -  Total Bilirubin 0.2 - 1.2 mg/dL - - -   Alkaline Phos 33 - 130 U/L - - -  AST 10 - 35 U/L - - -  ALT 6 - 29 U/L - - -    PENDING LABS:    DIAGNOSTIC IMAGING:    PATHOLOGY:     ASSESSMENT & PLAN:   Vitamin B12 deficiency:  -Thought to be 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 -Remains on monthly vitamin B12 injections; she tolerates these injections well.  We discussed the option of having her self administer these injections at home after receiving some teaching by nursing.  She is unsure if she would feel comfortable doing this.  She tells me her niece has helped her in the past, but unfortunately recently moved to [facility] for a new job opportunity.  She wants to think about the option of being able to self administer her B12 injections at home.  Therefore, for now we will continue to administer monthly B12 injections here at the cancer center.  Of course, I let her know that if she changed her mind, then nursing could teach her how to administer the shots at 1 of her upcoming injection appointments.  She agreed with this plan. -CBC reviewed in detail with patient today and remains normal.  Iron studies, folate, and vitamin B12 levels are all pending at the time of this dictation.  We will contact patient with the results when they are available. -Continue monthly vitamin B12 injections. -Return to cancer center in 1 year for follow-up with labs.      Dispo:  -Continue monthly vitamin B12 injections. -Return to cancer center in 1 year for follow-up visit with labs.   All questions were answered to patient's stated satisfaction. Encouraged patient to call with any new concerns or questions before her next visit to the cancer center and we can certain see her sooner, if needed.      Orders placed this encounter:  Orders Placed This Encounter  Procedures  . CBC with Differential/Platelet  . Basic metabolic panel  . Vitamin B12      Mike Craze, NP [place] [phone]

## 2017-06-06 NOTE — Patient Instructions (Signed)
[place] at [facility]  Discharge Instructions:  You were seen by Mike Craze, NP, _______________________________________________________________  Thank you for choosing [place] at [facility] to provide your oncology and hematology care.  To afford each patient quality time with our providers, please arrive at least 15 minutes before your scheduled appointment.  You need to re-schedule your appointment if you arrive 10 or more minutes late.  We strive to give you quality time with our providers, and arriving late affects you and other patients whose appointments are after yours.  Also, if you no show three or more times for appointments you may be dismissed from the clinic.  Again, thank you for choosing [place] at [facility] hope is that these requests will allow you access to exceptional care and in a timely manner. _______________________________________________________________  If you have questions after your visit, please contact our office at [phone] between the hours of 8:30 a.m. and 5:00 p.m. Voicemails left after 4:30 p.m. will not be returned until the following business day. _______________________________________________________________  For prescription refill requests, have your pharmacy contact our office. _______________________________________________________________  Recommendations made by the consultant and any test results will be sent to your referring physician. _______________________________________________________________

## 2017-06-25 ENCOUNTER — Other Ambulatory Visit: Payer: Self-pay | Admitting: Radiology

## 2017-06-25 DIAGNOSIS — Z96651 Presence of right artificial knee joint: Secondary | ICD-10-CM

## 2017-06-25 MED ORDER — MELOXICAM 15 MG PO TABS: 15.0000 mg | ORAL_TABLET | Freq: Every day | ORAL | 1 refills | Status: DC

## 2017-06-25 NOTE — Telephone Encounter (Signed)
Refill request received via fax for Meloxicam requesting 90 day supply  pended

## 2017-06-28 ENCOUNTER — Other Ambulatory Visit: Payer: Self-pay | Admitting: Family Medicine

## 2017-07-04 ENCOUNTER — Other Ambulatory Visit: Payer: Self-pay

## 2017-07-04 ENCOUNTER — Ambulatory Visit (INDEPENDENT_AMBULATORY_CARE_PROVIDER_SITE_OTHER): Payer: Federal, State, Local not specified - PPO | Admitting: Family Medicine

## 2017-07-04 ENCOUNTER — Encounter (HOSPITAL_COMMUNITY): Payer: Self-pay

## 2017-07-04 ENCOUNTER — Inpatient Hospital Stay (HOSPITAL_COMMUNITY): Payer: Federal, State, Local not specified - PPO | Attending: Oncology

## 2017-07-04 ENCOUNTER — Encounter: Payer: Self-pay | Admitting: Family Medicine

## 2017-07-04 VITALS — BP 120/80 | HR 81 | Resp 16 | Ht 63.0 in | Wt 187.0 lb

## 2017-07-04 VITALS — BP 122/64 | HR 71 | Temp 97.6°F | Resp 18

## 2017-07-04 DIAGNOSIS — Z1211 Encounter for screening for malignant neoplasm of colon: Secondary | ICD-10-CM | POA: Diagnosis not present

## 2017-07-04 DIAGNOSIS — E538 Deficiency of other specified B group vitamins: Secondary | ICD-10-CM | POA: Diagnosis not present

## 2017-07-04 DIAGNOSIS — Z0000 Encounter for general adult medical examination without abnormal findings: Secondary | ICD-10-CM

## 2017-07-04 DIAGNOSIS — R42 Dizziness and giddiness: Secondary | ICD-10-CM

## 2017-07-04 MED ORDER — CYANOCOBALAMIN 1000 MCG/ML IJ SOLN
INTRAMUSCULAR | Status: AC
  Filled 2017-07-04: qty 1

## 2017-07-04 MED ORDER — CYANOCOBALAMIN 1000 MCG/ML IJ SOLN
1000.0000 ug | Freq: Once | INTRAMUSCULAR | Status: AC
  Administered 2017-07-04: 1000 ug via INTRAMUSCULAR

## 2017-07-04 NOTE — Progress Notes (Signed)
Sheri Jones presents today for injection per the provider's orders.  B12 administration without incident; see MAR for injection details.  Patient tolerated procedure well and without incident.  No questions or complaints noted at this time.  Discharged ambulatory.  

## 2017-07-04 NOTE — Patient Instructions (Addendum)
F/u in 6 month, call if you need me before  Please return 3 stool cards as directed for colon cancer screening  Take meloxicam for knee pain for next 5 to 7 days  Fasting lipid, chem 7 and [facility] Sept & or shortly after  Please commit to change in food choice and smaller portion size for weight loss , also regular physical activity that you are able to do  Condolence re your mom's passing  Thank you  for choosing [facility]. We consider it a privelige to serve you.  Delivering excellent health care in a caring and  compassionate way is our goal.  Partnering with you,  so that together we can achieve this goal is our strategy.

## 2017-07-06 DIAGNOSIS — R42 Dizziness and giddiness: Secondary | ICD-10-CM | POA: Insufficient documentation

## 2017-07-06 NOTE — Assessment & Plan Note (Signed)
Improving, advised use of antivert on as needed basis at this time

## 2017-07-06 NOTE — Assessment & Plan Note (Signed)

## 2017-07-06 NOTE — Assessment & Plan Note (Signed)
Deteriorated. Patient re-educated about  the importance of commitment to a  minimum of 150 minutes of exercise per week.  The importance of healthy food choices with portion control discussed. Encouraged to start a food diary, count calories and to consider  joining a support group. Sample diet sheets offered. Goals set by the patient for the next several months.   Weight /BMI 07/04/2017 06/06/2017 03/20/2017  WEIGHT 187 lb 185 lb 9.6 oz 183 lb  HEIGHT 5\' 3"  - 5\' 3"   BMI 33.13 kg/m2 32.88 kg/m2 32.42 kg/m2

## 2017-07-06 NOTE — Progress Notes (Signed)
    Sheri Jones     MRN: [id number]      DOB: Sep 30, 1945  HPI: Patient is in for annual physical exam. C/o increased left knee pain and stiffness, no recent trauma, no buckling Recently seen in urgent care setting for vertigo, her symptoms have improved, but notes the need to still wear shade outside, less dependent on antivert though still has some Currently grieving recent loss of her Mother , coping , and also thinking about how to fill in new extra time. Immunization is reviewed    PE: BP 120/80   Pulse 81   Resp 16   Ht 5\' 3"  (1.6 m)   Wt 187 lb (84.8 kg)   SpO2 96%   BMI 33.13 kg/m   Pleasant  female, alert and oriented x 3, in no cardio-pulmonary distress. Afebrile. HEENT No facial trauma or asymetry. Sinuses non tender.  Extra occullar muscles intact,t. External ears normal, tympanic membranes clear. Oropharynx moist, no exudate. Neck: supple, no adenopathy,JVD or thyromegaly.No bruits.  Chest: Clear to ascultation bilaterally.No crackles or wheezes. Non tender to palpation  Breast: No asymetry,no masses or lumps. No tenderness. No nipple discharge or inversion. No axillary or supraclavicular adenopathy  Cardiovascular system; Heart sounds normal,  S1 and  S2 ,no S3.  No murmur, or thrill. Apical beat not displaced Peripheral pulses normal.  Abdomen: Soft, non tender, no organomegaly or masses. No bruits. Bowel sounds normal. No guarding, tenderness or rebound.  Rectal:  Deferred, pt to return 3 stool cards  GU: Asymptomatic, pt not examined   Musculoskeletal exam: Full ROM of spine, hips , shoulders and reduced in left knee. deformity ,swelling and  crepitus noted.in left knee No muscle wasting or atrophy.   Neurologic: Cranial nerves 2 to 12 intact. Power, tone ,sensation and reflexes normal throughout. No tremor.  Skin: Intact, no ulceration, erythema , scaling or rash noted. Pigmentation normal throughout  Psych; Normal mood and  affect. Judgement and concentration normal   Assessment & Plan:  Annual physical exam Annual exam as documented. Counseling done  re healthy lifestyle involving commitment to 150 minutes exercise per week, heart healthy diet, and attaining healthy weight.The importance of adequate sleep also discussed. Regular seat belt use and home safety, is also discussed. Changes in health habits are decided on by the patient with goals and time frames  set for achieving them. Immunization and cancer screening needs are specifically addressed at this visit.   KNEE, ARTHRITIS, DEGEN./OSTEO Currently uncontrolled in left knee, pt to commit to daily meloxicam for 7 to 10 days and pork on exercises to strengthen knee support and lose weight  Obesity Deteriorated. Patient re-educated about  the importance of commitment to a  minimum of 150 minutes of exercise per week.  The importance of healthy food choices with portion control discussed. Encouraged to start a food diary, count calories and to consider  joining a support group. Sample diet sheets offered. Goals set by the patient for the next several months.   Weight /BMI 07/04/2017 06/06/2017 03/20/2017  WEIGHT 187 lb 185 lb 9.6 oz 183 lb  HEIGHT 5\' 3"  - 5\' 3"   BMI 33.13 kg/m2 32.88 kg/m2 32.42 kg/m2      Vertigo Improving, advised use of antivert on as needed basis at this time

## 2017-07-06 NOTE — Assessment & Plan Note (Signed)
Currently uncontrolled in left knee, pt to commit to daily meloxicam for 7 to 10 days and pork on exercises to strengthen knee support and lose weight

## 2017-07-08 ENCOUNTER — Telehealth: Payer: Self-pay | Admitting: Family Medicine

## 2017-07-08 NOTE — Telephone Encounter (Signed)
Pt LVM wanting to know if she still should take Vit D, as there is no reills, please advise [phone]

## 2017-07-09 NOTE — Telephone Encounter (Signed)
Message left for patient in response to her question

## 2017-07-09 NOTE — Telephone Encounter (Signed)
No extra vit D supplement, just calcium with D

## 2017-07-09 NOTE — Telephone Encounter (Signed)
Last vit d was normal. Please advise

## 2017-07-17 LAB — HEMOCCULT GUIAC POC 1CARD (OFFICE)
Card #3 Fecal Occult Blood, POC: NEGATIVE
FECAL OCCULT BLD: NEGATIVE
FECAL OCCULT BLD: NEGATIVE

## 2017-07-17 NOTE — Addendum Note (Signed)
Addended by: Eual Fines on: 07/17/2017 08:16 AM   Modules accepted: Orders

## 2017-07-29 ENCOUNTER — Ambulatory Visit (INDEPENDENT_AMBULATORY_CARE_PROVIDER_SITE_OTHER): Payer: Federal, State, Local not specified - PPO | Admitting: Otolaryngology

## 2017-07-29 DIAGNOSIS — H6123 Impacted cerumen, bilateral: Secondary | ICD-10-CM | POA: Diagnosis not present

## 2017-07-29 DIAGNOSIS — H903 Sensorineural hearing loss, bilateral: Secondary | ICD-10-CM

## 2017-08-05 ENCOUNTER — Inpatient Hospital Stay (HOSPITAL_COMMUNITY): Payer: Federal, State, Local not specified - PPO | Attending: Oncology

## 2017-08-05 ENCOUNTER — Other Ambulatory Visit: Payer: Self-pay

## 2017-08-05 ENCOUNTER — Encounter (HOSPITAL_COMMUNITY): Payer: Self-pay

## 2017-08-05 VITALS — BP 132/71 | HR 68 | Resp 16

## 2017-08-05 DIAGNOSIS — E538 Deficiency of other specified B group vitamins: Secondary | ICD-10-CM | POA: Diagnosis present

## 2017-08-05 MED ORDER — CYANOCOBALAMIN 1000 MCG/ML IJ SOLN
1000.0000 ug | Freq: Once | INTRAMUSCULAR | Status: AC
  Administered 2017-08-05: 1000 ug via INTRAMUSCULAR

## 2017-08-05 MED ORDER — CYANOCOBALAMIN 1000 MCG/ML IJ SOLN
INTRAMUSCULAR | Status: AC
  Filled 2017-08-05: qty 1

## 2017-08-05 NOTE — Patient Instructions (Signed)
[facility] at [facility] Discharge Instructions  B12 injection given today Follow up as scheduled.   Thank you for choosing  [facility] at [facility] to provide your oncology and hematology care.  To afford each patient quality time with our provider, please arrive at least 15 minutes before your scheduled appointment time.   If you have a lab appointment with the Cancer Center please come in thru the  Main Entrance and check in at the main information desk  You need to re-schedule your appointment should you arrive 10 or more minutes late.  We strive to give you quality time with our providers, and arriving late affects you and other patients whose appointments are after yours.  Also, if you no show three or more times for appointments you may be dismissed from the clinic at the providers discretion.     Again, thank you for choosing [facility].  Our hope is that these requests will decrease the amount of time that you wait before being seen by our physicians.       _____________________________________________________________  Should you have questions after your visit to  Cancer Center, please contact our office at [phone] between the hours of 8:30 a.m. and 4:30 p.m.  Voicemails left after 4:30 p.m. will not be returned until the following business day.  For prescription refill requests, have your pharmacy contact our office.       Resources For Cancer Patients and their Caregivers ? American Cancer Society: Can assist with transportation, wigs, general needs, runs Look Good Feel Better.        1-888-227-6333 ? Cancer Care: Provides financial assistance, online support groups, medication/co-pay assistance.  1-800-813-HOPE (4673) ? [facility] Assists [place] cancer patients and their families through emotional , educational and financial support.  336-427-4357 ? [place]  DSS Where to apply for food stamps, Medicaid and utility assistance. 336-342-1394 ? [facility]: Transportation to medical appointments. 336-347-2287 ? Social Security Administration: May apply for disability if have a Stage IV cancer. 336-342-7796 1-800-772-1213 ? [place] Aging, Disability and Transit Services: Assists with nutrition, care and transit needs. 336-349-2343  Cancer Center Support Programs:   > Cancer Support Group  2nd Tuesday of the month 1pm-2pm, Journey Room   > Creative Journey  3rd Tuesday of the month 1130am-1pm, Journey Room    

## 2017-08-05 NOTE — Progress Notes (Signed)
Sheri Jones presents today for injection per MD orders. B12 1,000 mcg administered IM  in right Upper Arm. Administration without incident. Patient tolerated well.   Vitals stable and discharged home from clinic ambulatory. Follow up as scheduled.

## 2017-09-04 ENCOUNTER — Other Ambulatory Visit: Payer: Self-pay

## 2017-09-04 ENCOUNTER — Inpatient Hospital Stay (HOSPITAL_COMMUNITY): Payer: Federal, State, Local not specified - PPO | Attending: Oncology

## 2017-09-04 VITALS — BP 138/67 | HR 62 | Resp 16

## 2017-09-04 DIAGNOSIS — E538 Deficiency of other specified B group vitamins: Secondary | ICD-10-CM | POA: Insufficient documentation

## 2017-09-04 MED ORDER — CYANOCOBALAMIN 1000 MCG/ML IJ SOLN
INTRAMUSCULAR | Status: AC
  Filled 2017-09-04: qty 1

## 2017-09-04 MED ORDER — CYANOCOBALAMIN 1000 MCG/ML IJ SOLN
1000.0000 ug | Freq: Once | INTRAMUSCULAR | Status: AC
  Administered 2017-09-04: 1000 ug via INTRAMUSCULAR

## 2017-09-04 NOTE — Progress Notes (Signed)
Sheri Jones presents today for injection per MD orders. B12 1,000 mcg administered IM in right Upper Arm. Administration without incident. Patient tolerated well.   Vitals stable and discharged home from clinic ambulatory. Follow up as scheduled.

## 2017-09-04 NOTE — Patient Instructions (Signed)
[place] at [facility] Discharge Instructions  B12 injection given today.  Packet given about advance directives, chaplin card given   Thank you for choosing [place] at [facility] to provide your oncology and hematology care.  To afford each patient quality time with our provider, please arrive at least 15 minutes before your scheduled appointment time.   If you have a lab appointment with the [facility] please come in thru the  Main Entrance and check in at the main information desk  You need to re-schedule your appointment should you arrive 10 or more minutes late.  We strive to give you quality time with our providers, and arriving late affects you and other patients whose appointments are after yours.  Also, if you no show three or more times for appointments you may be dismissed from the clinic at the providers discretion.     Again, thank you for choosing [facility].  Our hope is that these requests will decrease the amount of time that you wait before being seen by our physicians.       _____________________________________________________________  Should you have questions after your visit to [facility], please contact our office at [phone] between the hours of 8:30 a.m. and 4:30 p.m.  Voicemails left after 4:30 p.m. will not be returned until the following business day.  For prescription refill requests, have your pharmacy contact our office.       Resources For Cancer Patients and their Caregivers ? American Cancer Society: Can assist with transportation, wigs, general needs, runs Look Good Feel Better.        772-296-1362 ? Cancer Care: Provides financial assistance, online support groups, medication/co-pay assistance.  1-800-813-HOPE 551 392 4480) ? [place] Assists [place] cancer patients and their families through emotional , educational and financial  support.  520-625-4614 ? [place] DSS Where to apply for food stamps, Medicaid and utility assistance. (830) 633-2073 ? RCATS: Transportation to medical appointments. (303) 388-0657 ? Social Security Administration: May apply for disability if have a Stage IV cancer. 437-148-6562 213-141-8096 ? [employer], Disability and Transit Services: Assists with nutrition, care and transit needs. [place] Support Programs:   > Cancer Support Group  2nd Tuesday of the month 1pm-2pm, Journey Room   > Creative Journey  3rd Tuesday of the month 1130am-1pm, Journey Room

## 2017-09-25 ENCOUNTER — Other Ambulatory Visit: Payer: Self-pay | Admitting: Family Medicine

## 2017-10-07 ENCOUNTER — Inpatient Hospital Stay (HOSPITAL_COMMUNITY): Payer: Federal, State, Local not specified - PPO | Attending: Oncology

## 2017-10-07 ENCOUNTER — Encounter (HOSPITAL_COMMUNITY): Payer: Self-pay

## 2017-10-07 VITALS — BP 120/63 | HR 62 | Temp 97.7°F | Resp 18

## 2017-10-07 DIAGNOSIS — E538 Deficiency of other specified B group vitamins: Secondary | ICD-10-CM | POA: Insufficient documentation

## 2017-10-07 MED ORDER — CYANOCOBALAMIN 1000 MCG/ML IJ SOLN
1000.0000 ug | Freq: Once | INTRAMUSCULAR | Status: AC
  Administered 2017-10-07: 1000 ug via INTRAMUSCULAR
  Filled 2017-10-07: qty 1

## 2017-10-07 NOTE — Patient Instructions (Signed)
[facility] at [facility]  Discharge Instructions:  You received a b12 shot today.  _______________________________________________________________  Thank you for choosing [facility] at [facility] to provide your oncology and hematology care.  To afford each patient quality time with our providers, please arrive at least 15 minutes before your scheduled appointment.  You need to re-schedule your appointment if you arrive 10 or more minutes late.  We strive to give you quality time with our providers, and arriving late affects you and other patients whose appointments are after yours.  Also, if you no show three or more times for appointments you may be dismissed from the clinic.  Again, thank you for choosing [facility] at [facility]. Our hope is that these requests will allow you access to exceptional care and in a timely manner. _______________________________________________________________  If you have questions after your visit, please contact our office at [phone] between the hours of 8:30 a.m. and 5:00 p.m. Voicemails left after 4:30 p.m. will not be returned until the following business day. _______________________________________________________________  For prescription refill requests, have your pharmacy contact our office. _______________________________________________________________  Recommendations made by the consultant and any test results will be sent to your referring physician. _______________________________________________________________ 

## 2017-11-06 ENCOUNTER — Encounter (HOSPITAL_COMMUNITY): Payer: Self-pay

## 2017-11-06 ENCOUNTER — Inpatient Hospital Stay (HOSPITAL_COMMUNITY): Payer: Federal, State, Local not specified - PPO | Attending: Oncology

## 2017-11-06 ENCOUNTER — Other Ambulatory Visit: Payer: Self-pay

## 2017-11-06 VITALS — BP 149/74 | HR 69 | Temp 97.6°F | Resp 18

## 2017-11-06 DIAGNOSIS — E538 Deficiency of other specified B group vitamins: Secondary | ICD-10-CM

## 2017-11-06 MED ORDER — CYANOCOBALAMIN 1000 MCG/ML IJ SOLN
1000.0000 ug | Freq: Once | INTRAMUSCULAR | Status: AC
  Administered 2017-11-06: 1000 ug via INTRAMUSCULAR

## 2017-11-06 NOTE — Progress Notes (Signed)
Sheri Jones presents today for injection per MD orders. B12 1071mcg administered IM in left Upper Arm. Administration without incident. Patient tolerated well.

## 2017-11-11 ENCOUNTER — Encounter: Payer: Self-pay | Admitting: Family Medicine

## 2017-11-11 ENCOUNTER — Other Ambulatory Visit: Payer: Self-pay

## 2017-11-11 ENCOUNTER — Ambulatory Visit (INDEPENDENT_AMBULATORY_CARE_PROVIDER_SITE_OTHER): Payer: Federal, State, Local not specified - PPO | Admitting: Family Medicine

## 2017-11-11 VITALS — BP 126/80 | HR 74 | Resp 12 | Ht 63.0 in | Wt 197.0 lb

## 2017-11-11 DIAGNOSIS — F439 Reaction to severe stress, unspecified: Secondary | ICD-10-CM | POA: Diagnosis not present

## 2017-11-11 DIAGNOSIS — Z23 Encounter for immunization: Secondary | ICD-10-CM

## 2017-11-11 DIAGNOSIS — N3090 Cystitis, unspecified without hematuria: Secondary | ICD-10-CM

## 2017-11-11 DIAGNOSIS — M545 Low back pain: Secondary | ICD-10-CM

## 2017-11-11 DIAGNOSIS — E559 Vitamin D deficiency, unspecified: Secondary | ICD-10-CM

## 2017-11-11 DIAGNOSIS — I10 Essential (primary) hypertension: Secondary | ICD-10-CM

## 2017-11-11 LAB — POCT URINALYSIS DIPSTICK
BILIRUBIN UA: NEGATIVE
Blood, UA: NEGATIVE
GLUCOSE UA: NEGATIVE
KETONES UA: NEGATIVE
Leukocytes, UA: NEGATIVE
Nitrite, UA: NEGATIVE
Protein, UA: NEGATIVE
Spec Grav, UA: 1.025 (ref 1.010–1.025)
Urobilinogen, UA: 0.2 E.U./dL
pH, UA: 6 (ref 5.0–8.0)

## 2017-11-11 NOTE — Progress Notes (Signed)
   Sheri Jones     MRN: [id number]      DOB: 08/20/1945   HPI [name] is here 3 day h/o biltaral flank pain woke with the pain , concerned she may have a uTI, denies dysuria  Or frequency or hematuria, but concerned she may have UTI No physical trauma to incite the pain, but mentally stressed as far as interaction with her siblings surrounding recent passing of her mother, notes she tends to  Develop physical symptoms when mentally/ emotionally stressed and thinks may be the problem  ROS Denies recent fever or chills. Denies sinus pressure, nasal congestion, ear pain or sore throat. Denies chest congestion, productive cough or wheezing. Denies chest pains, palpitations and leg swelling Denies abdominal pain, nausea, vomiting,diarrhea or constipation.     Denies headaches, seizures, numbness, or tingling.  Denies skin break down or rash.   PE  BP 126/80 (BP Location: Left Arm, Patient Position: Sitting, Cuff Size: Normal)   Pulse 74   Ht 5\' 3"  (1.6 m)   Wt 197 lb (89.4 kg)   SpO2 97%   BMI 34.90 kg/m   Patient alert and oriented and in no cardiopulmonary distress.  HEENT: No facial asymmetry, EOMI,   oropharynx pink and moist.  Neck supple no JVD, no mass.  Chest: Clear to auscultation bilaterally.  CVS: S1, S2 no murmurs, no S3.Regular rate.  ABD: Soft no flank or suprapubic tenderness Ext: No edema  MS: Adequate ROM spine, shoulders, hips and knees.  Skin: Intact, no ulcerations or rash noted.  Psych: Good eye contact, normal affect. Memory intact mildly  anxious not  depressed appearing.  CNS: CN 2-12 intact, power,  normal throughout.no focal deficits noted.   Assessment & Plan  Need for shingles vaccine After obtaining informed consent, the vaccine is  administered by LPN.   Cystitis Pt concerned regarding possible UTI, normal UA, reassured  Stress at home Increased emotional stress among siblings related to passing of mother, pt encouraged to  vent briefly which resulted I some symptom relief, therapy offered , she states not need  Essential hypertension Controlled, no change in medication DASH diet and commitment to daily physical activity for a minimum of 30 minutes discussed and encouraged, as a part of hypertension management. The importance of attaining a healthy weight is also discussed.  BP/Weight 11/11/2017 11/06/2017 10/07/2017 09/04/2017 08/05/2017 07/04/2017 2/83/6629  Systolic BP 476 546 503 546 568 127 517  Diastolic BP 80 74 63 67 71 64 80  Wt. (Lbs) 197 - - - - - 187  BMI 34.9 - - - - - 33.13       Morbid obesity (HCC) Deteriorated. Patient re-educated about  the importance of commitment to a  minimum of 150 minutes of exercise per week.  The importance of healthy food choices with portion control discussed. Encouraged to start a food diary, count calories and to consider  joining a support group. Sample diet sheets offered. Goals set by the patient for the next several months.   Weight /BMI 11/11/2017 07/04/2017 06/06/2017  WEIGHT 197 lb 187 lb 185 lb 9.6 oz  HEIGHT 5\' 3"  5\' 3"  -  BMI 34.9 kg/m2 33.13 kg/m2 32.88 kg/m2

## 2017-11-11 NOTE — Patient Instructions (Addendum)
F/U as before , call if you need me sooner   Shingrix #1 today  Fasting lipid, cmp and EGFr, TSH and Vit D Sept 8 or after at [facility] lab  No evidence of infected urine  Continue to work on stress  It is important that you exercise regularly at least 30 minutes 5 times a week. If you develop chest pain, have severe difficulty breathing, or feel very tired, stop exercising immediately and seek medical attention  Please work on good  health habits so that your health will improve. 1. Commitment to daily physical activity for 30 to 60  minutes, if you are able to do this.  2. Commitment to wise food choices. Aim for half of your  food intake to be vegetable and fruit, one quarter starchy foods, and one quarter protein. Try to eat on a regular schedule  3 meals per day, snacking between meals should be limited to vegetables or fruits or small portions of nuts. 64 ounces of water per day is generally recommended, unless you have specific health conditions, like heart failure or kidney failure where you will need to limit fluid intake.  3. Commitment to sufficient and a  good quality of physical and mental rest daily, generally between 6 to 8 hours per day.  WITH PERSISTANCE AND PERSEVERANCE, THE IMPOSSIBLE , BECOMES THE NORM!

## 2017-11-18 ENCOUNTER — Encounter: Payer: Self-pay | Admitting: Family Medicine

## 2017-11-18 DIAGNOSIS — N3090 Cystitis, unspecified without hematuria: Secondary | ICD-10-CM | POA: Insufficient documentation

## 2017-11-18 DIAGNOSIS — F439 Reaction to severe stress, unspecified: Secondary | ICD-10-CM | POA: Insufficient documentation

## 2017-11-18 NOTE — Assessment & Plan Note (Signed)
After obtaining informed consent, the vaccine is  administered by LPN.  

## 2017-11-18 NOTE — Assessment & Plan Note (Signed)
Controlled, no change in medication DASH diet and commitment to daily physical activity for a minimum of 30 minutes discussed and encouraged, as a part of hypertension management. The importance of attaining a healthy weight is also discussed.  BP/Weight 11/11/2017 11/06/2017 10/07/2017 09/04/2017 08/05/2017 07/04/2017 06/27/3541  Systolic BP 014 840 397 953 692 230 097  Diastolic BP 80 74 63 67 71 64 80  Wt. (Lbs) 197 - - - - - 187  BMI 34.9 - - - - - 33.13

## 2017-11-18 NOTE — Assessment & Plan Note (Signed)
Increased emotional stress among siblings related to passing of mother, pt encouraged to vent briefly which resulted I some symptom relief, therapy offered , she states not need

## 2017-11-18 NOTE — Assessment & Plan Note (Signed)
Deteriorated. Patient re-educated about  the importance of commitment to a  minimum of 150 minutes of exercise per week.  The importance of healthy food choices with portion control discussed. Encouraged to start a food diary, count calories and to consider  joining a support group. Sample diet sheets offered. Goals set by the patient for the next several months.   Weight /BMI 11/11/2017 07/04/2017 06/06/2017  WEIGHT 197 lb 187 lb 185 lb 9.6 oz  HEIGHT 5\' 3"  5\' 3"  -  BMI 34.9 kg/m2 33.13 kg/m2 32.88 kg/m2

## 2017-11-18 NOTE — Assessment & Plan Note (Signed)
Pt concerned regarding possible UTI, normal UA, reassured

## 2017-12-06 ENCOUNTER — Other Ambulatory Visit (HOSPITAL_COMMUNITY): Payer: Self-pay | Admitting: Hematology

## 2017-12-09 ENCOUNTER — Other Ambulatory Visit: Payer: Self-pay

## 2017-12-09 ENCOUNTER — Inpatient Hospital Stay (HOSPITAL_COMMUNITY): Payer: Federal, State, Local not specified - PPO | Attending: Oncology

## 2017-12-09 ENCOUNTER — Encounter (HOSPITAL_COMMUNITY): Payer: Self-pay

## 2017-12-09 VITALS — BP 127/74 | HR 72 | Temp 98.1°F | Resp 18

## 2017-12-09 DIAGNOSIS — E538 Deficiency of other specified B group vitamins: Secondary | ICD-10-CM | POA: Insufficient documentation

## 2017-12-09 MED ORDER — CYANOCOBALAMIN 1000 MCG/ML IJ SOLN
1000.0000 ug | Freq: Once | INTRAMUSCULAR | Status: AC
  Administered 2017-12-09: 1000 ug via INTRAMUSCULAR

## 2017-12-09 MED ORDER — CYANOCOBALAMIN 1000 MCG/ML IJ SOLN
INTRAMUSCULAR | Status: AC
  Filled 2017-12-09: qty 1

## 2017-12-09 NOTE — Progress Notes (Signed)
Sheri Jones presents today for injection per the provider's orders.  B12 administration without incident; see MAR for injection details.  Patient tolerated procedure well and without incident.  No questions or complaints noted at this time.  Discharged ambulatory.  

## 2017-12-16 ENCOUNTER — Other Ambulatory Visit: Payer: Self-pay | Admitting: Family Medicine

## 2017-12-16 DIAGNOSIS — Z1231 Encounter for screening mammogram for malignant neoplasm of breast: Secondary | ICD-10-CM

## 2017-12-18 ENCOUNTER — Other Ambulatory Visit: Payer: Self-pay | Admitting: Family Medicine

## 2018-01-04 LAB — COMPLETE METABOLIC PANEL WITH GFR
AG RATIO: 1.6 (calc) (ref 1.0–2.5)
ALKALINE PHOSPHATASE (APISO): 70 U/L (ref 33–130)
ALT: 20 U/L (ref 6–29)
AST: 27 U/L (ref 10–35)
Albumin: 3.9 g/dL (ref 3.6–5.1)
BILIRUBIN TOTAL: 0.7 mg/dL (ref 0.2–1.2)
BUN: 21 mg/dL (ref 7–25)
CHLORIDE: 107 mmol/L (ref 98–110)
CO2: 27 mmol/L (ref 20–32)
Calcium: 9.3 mg/dL (ref 8.6–10.4)
Creat: 0.96 mg/dL (ref 0.50–0.99)
GFR, Est African American: 70 mL/min/{1.73_m2} (ref >=60)
GFR, Est Non African American: 60 mL/min/{1.73_m2} (ref >=60)
Globulin: 2.4 g/dL (calc) (ref 1.9–3.7)
Glucose, Bld: 83 mg/dL (ref 65–99)
POTASSIUM: 4.1 mmol/L (ref 3.5–5.3)
SODIUM: 143 mmol/L (ref 135–146)
TOTAL PROTEIN: 6.3 g/dL (ref 6.1–8.1)

## 2018-01-04 LAB — LIPID PANEL
Cholesterol: 182 mg/dL (ref <200)
HDL: 75 mg/dL (ref >50)
LDL Cholesterol (Calc): 92 mg/dL (calc)
NON-HDL CHOLESTEROL (CALC): 107 mg/dL (ref <130)
Total CHOL/HDL Ratio: 2.4 (calc) (ref <5.0)
Triglycerides: 63 mg/dL (ref <150)

## 2018-01-04 LAB — VITAMIN D 25 HYDROXY (VIT D DEFICIENCY, FRACTURES): VIT D 25 HYDROXY: 37 ng/mL (ref 30–100)

## 2018-01-04 LAB — TSH: TSH: 1.08 mIU/L (ref 0.40–4.50)

## 2018-01-08 ENCOUNTER — Ambulatory Visit (INDEPENDENT_AMBULATORY_CARE_PROVIDER_SITE_OTHER): Payer: Federal, State, Local not specified - PPO | Admitting: Family Medicine

## 2018-01-08 ENCOUNTER — Encounter: Payer: Self-pay | Admitting: Family Medicine

## 2018-01-08 VITALS — BP 118/70 | HR 72 | Resp 15 | Ht 63.0 in | Wt 196.0 lb

## 2018-01-08 DIAGNOSIS — Z23 Encounter for immunization: Secondary | ICD-10-CM | POA: Diagnosis not present

## 2018-01-08 DIAGNOSIS — E538 Deficiency of other specified B group vitamins: Secondary | ICD-10-CM

## 2018-01-08 DIAGNOSIS — I10 Essential (primary) hypertension: Secondary | ICD-10-CM

## 2018-01-08 NOTE — Progress Notes (Signed)
   Sheri Jones     MRN: [id number]      DOB: 03/30/1946   HPI Ms. Brent is here for follow up and re-evaluation of chronic medical conditions, medication management and review of any available recent lab and radiology data.  Preventive health is updated, specifically  Cancer screening and Immunization.   Questions or concerns regarding consultations or procedures which the PT has had in the interim are  addressed. The PT denies any adverse reactions to current medications since the last visit.  Main concern is difficulty with weight loss  And also has questions about the HCG treatment  For weight loss  Her exercise commitment is good, but still has an environment that is full of sugar depresssionand anxiety over passing of her Mom has lessened  ROS Denies recent fever or chills. Denies sinus pressure, nasal congestion, ear pain or sore throat. Denies chest congestion, productive cough or wheezing. Denies chest pains, palpitations and leg swelling Denies abdominal pain, nausea, vomiting,diarrhea or constipation.   Denies dysuria, frequency, hesitancy or incontinence. Denies uncontrolled  joint pain, swelling and limitation in mobility. Denies headaches, seizures, numbness, or tingling. Denies depression, anxiety or insomnia. Denies skin break down or rash.   PE  BP 118/70   Pulse 72   Resp 15   Ht 5\' 3"  (1.6 m)   Wt 196 lb (88.9 kg)   SpO2 99%   BMI 34.72 kg/m   Patient alert and oriented and in no cardiopulmonary distress.  HEENT: No facial asymmetry, EOMI,   oropharynx pink and moist.  Neck supple no JVD, no mass.  Chest: Clear to auscultation bilaterally.  CVS: S1, S2 no murmurs, no S3.Regular rate.  ABD: Soft non tender.   Ext: No edema  MS: Adequate ROM spine, shoulders, hips and knees.  Skin: Intact, no ulcerations or rash noted.  Psych: Good eye contact, normal affect. Memory intact not anxious or depressed appearing.  CNS: CN 2-12 intact, power,   normal throughout.no focal deficits noted.   Assessment & Plan  Essential hypertension Controlled, no change in medication DASH diet and commitment to daily physical activity for a minimum of 30 minutes discussed and encouraged, as a part of hypertension management. The importance of attaining a healthy weight is also discussed.  BP/Weight 01/08/2018 12/09/2017 11/11/2017 11/06/2017 10/07/2017 09/04/2017 7/93/9030  Systolic BP 092 330 076 226 333 545 625  Diastolic BP 70 74 80 74 63 67 71  Wt. (Lbs) 196 - 197 - - - -  BMI 34.72 - 34.9 - - - -       Need for shingles vaccine After obtaining informed consent, the vaccine is  administered by LPN.   Morbid obesity (HCC) Unchanged. Patient re-educated about  the importance of commitment to a  minimum of 150 minutes of exercise per week.  The importance of healthy food choices with portion control discussed. Encouraged to start a food diary, count calories and to consider  joining a support group.She is also to call her health coach and try to establish a relationship . Goals set by the patient for the next several months.   Weight /BMI 01/08/2018 11/11/2017 07/04/2017  WEIGHT 196 lb 197 lb 187 lb  HEIGHT 5\' 3"  5\' 3"  5\' 3"   BMI 34.72 kg/m2 34.9 kg/m2 33.13 kg/m2      Vitamin B12 deficiency disease Gets injections  Monthly

## 2018-01-08 NOTE — Assessment & Plan Note (Signed)
Controlled, no change in medication DASH diet and commitment to daily physical activity for a minimum of 30 minutes discussed and encouraged, as a part of hypertension management. The importance of attaining a healthy weight is also discussed.  BP/Weight 01/08/2018 12/09/2017 11/11/2017 11/06/2017 10/07/2017 09/04/2017 1/82/9937  Systolic BP 169 678 938 101 751 025 852  Diastolic BP 70 74 80 74 63 67 71  Wt. (Lbs) 196 - 197 - - - -  BMI 34.72 - 34.9 - - - -

## 2018-01-08 NOTE — Patient Instructions (Signed)
F/U in 4 months, call if you need me before   Shingrix # 2 today  Please return the last Friday in October before 11:30 for your flu vaccine  Happy 72[birth year] when it comes!!!   Ne med changes  Clean up food environ at home and continue to be active/ exercise committed. Try to get coach!

## 2018-01-08 NOTE — Addendum Note (Signed)
Addended by: Eual Fines on: 01/08/2018 02:19 PM   Modules accepted: Orders

## 2018-01-08 NOTE — Assessment & Plan Note (Signed)
Gets injections  Monthly

## 2018-01-08 NOTE — Assessment & Plan Note (Signed)
Unchanged. Patient re-educated about  the importance of commitment to a  minimum of 150 minutes of exercise per week.  The importance of healthy food choices with portion control discussed. Encouraged to start a food diary, count calories and to consider  joining a support group.She is also to call her health coach and try to establish a relationship . Goals set by the patient for the next several months.   Weight /BMI 01/08/2018 11/11/2017 07/04/2017  WEIGHT 196 lb 197 lb 187 lb  HEIGHT 5\' 3"  5\' 3"  5\' 3"   BMI 34.72 kg/m2 34.9 kg/m2 33.13 kg/m2

## 2018-01-08 NOTE — Assessment & Plan Note (Signed)
After obtaining informed consent, the vaccine is  administered by LPN.  

## 2018-01-09 ENCOUNTER — Other Ambulatory Visit: Payer: Self-pay

## 2018-01-09 ENCOUNTER — Inpatient Hospital Stay (HOSPITAL_COMMUNITY): Payer: Federal, State, Local not specified - PPO | Attending: Oncology

## 2018-01-09 ENCOUNTER — Ambulatory Visit (HOSPITAL_COMMUNITY)
Admission: RE | Admit: 2018-01-09 | Discharge: 2018-01-09 | Disposition: A | Discharge status: Home / Self Care | Payer: Federal, State, Local not specified - PPO | Source: Ambulatory Visit | Attending: Family Medicine | Admitting: Family Medicine

## 2018-01-09 ENCOUNTER — Encounter (HOSPITAL_COMMUNITY): Payer: Self-pay

## 2018-01-09 ENCOUNTER — Ambulatory Visit (HOSPITAL_COMMUNITY): Payer: Federal, State, Local not specified - PPO

## 2018-01-09 VITALS — BP 140/76 | HR 68 | Temp 98.5°F | Resp 18

## 2018-01-09 DIAGNOSIS — E538 Deficiency of other specified B group vitamins: Secondary | ICD-10-CM | POA: Insufficient documentation

## 2018-01-09 DIAGNOSIS — Z1231 Encounter for screening mammogram for malignant neoplasm of breast: Secondary | ICD-10-CM | POA: Insufficient documentation

## 2018-01-09 MED ORDER — CYANOCOBALAMIN 1000 MCG/ML IJ SOLN
1000.0000 ug | Freq: Once | INTRAMUSCULAR | Status: AC
  Administered 2018-01-09: 1000 ug via INTRAMUSCULAR

## 2018-01-09 MED ORDER — CYANOCOBALAMIN 1000 MCG/ML IJ SOLN
INTRAMUSCULAR | Status: AC
  Filled 2018-01-09: qty 1

## 2018-01-09 NOTE — Progress Notes (Signed)
Sheri Jones presents today for injection per the provider's orders.  B12 administration without incident; see MAR for injection details.  Patient tolerated procedure well and without incident.  No questions or complaints noted at this time.  Discharged ambulatory.  

## 2018-02-07 ENCOUNTER — Ambulatory Visit (HOSPITAL_COMMUNITY): Payer: Federal, State, Local not specified - PPO

## 2018-02-10 ENCOUNTER — Other Ambulatory Visit: Payer: Self-pay

## 2018-02-10 ENCOUNTER — Inpatient Hospital Stay (HOSPITAL_COMMUNITY): Payer: Federal, State, Local not specified - PPO | Attending: Oncology

## 2018-02-10 ENCOUNTER — Encounter (HOSPITAL_COMMUNITY): Payer: Self-pay

## 2018-02-10 VITALS — BP 144/70 | HR 76 | Temp 97.8°F | Resp 20

## 2018-02-10 DIAGNOSIS — E538 Deficiency of other specified B group vitamins: Secondary | ICD-10-CM

## 2018-02-10 MED ORDER — CYANOCOBALAMIN 1000 MCG/ML IJ SOLN
1000.0000 ug | Freq: Once | INTRAMUSCULAR | Status: AC
  Administered 2018-02-10: 1000 ug via INTRAMUSCULAR

## 2018-02-10 MED ORDER — CYANOCOBALAMIN 1000 MCG/ML IJ SOLN
INTRAMUSCULAR | Status: AC
  Filled 2018-02-10: qty 1

## 2018-02-10 NOTE — Progress Notes (Signed)
Sheri Jones presents today for injection per MD orders. b12 administered IM in right Gluteal. Administration without incident. Patient tolerated well.

## 2018-02-17 ENCOUNTER — Ambulatory Visit (INDEPENDENT_AMBULATORY_CARE_PROVIDER_SITE_OTHER): Payer: Federal, State, Local not specified - PPO | Admitting: Otolaryngology

## 2018-02-17 DIAGNOSIS — H6123 Impacted cerumen, bilateral: Secondary | ICD-10-CM | POA: Diagnosis not present

## 2018-02-20 ENCOUNTER — Ambulatory Visit (INDEPENDENT_AMBULATORY_CARE_PROVIDER_SITE_OTHER): Payer: Federal, State, Local not specified - PPO | Admitting: Family Medicine

## 2018-02-20 ENCOUNTER — Encounter: Payer: Self-pay | Admitting: Family Medicine

## 2018-02-20 VITALS — BP 126/78 | HR 72 | Resp 12 | Ht 62.0 in | Wt 197.1 lb

## 2018-02-20 DIAGNOSIS — R7303 Prediabetes: Secondary | ICD-10-CM

## 2018-02-20 DIAGNOSIS — Z0000 Encounter for general adult medical examination without abnormal findings: Secondary | ICD-10-CM

## 2018-02-20 DIAGNOSIS — Z23 Encounter for immunization: Secondary | ICD-10-CM | POA: Diagnosis not present

## 2018-02-20 MED ORDER — MECLIZINE HCL 25 MG PO TABS: 25.0000 mg | ORAL_TABLET | Freq: Three times a day (TID) | ORAL | 1 refills | Status: DC | PRN

## 2018-02-20 NOTE — Progress Notes (Signed)
Preventive Screening-Counseling & Management   Patient present here today for a Medicare annual wellness visit.   Current Problems (verified)   Medications Prior to Visit Allergies (verified)   PAST HISTORY  Family History  Social History divorced , married for 1 year, no children.No alcohol, never smoker , no illicit drug use   Risk Factors  Current exercise habits:  Daily for 90 mins  Dietary issues discussed:need to increase fresh / frozen and veg to 70 % intake and water  Cardiac risk factors: sibling with CABG in 60's  Depression Screen  (Note: if answer to either of the following is "Yes", a more complete depression screening is indicated)   Over the past two weeks, have you felt down, depressed or hopeless? No  Over the past two weeks, have you felt little interest or pleasure in doing things? No  Have you lost interest or pleasure in daily life? No  Do you often feel hopeless? No  Do you cry easily over simple problems? No   Activities of Daily Living  In your present state of health, do you have any difficulty performing the following activities?  Driving?: No Managing money?: No Feeding yourself?:No Getting from bed to chair?:No Climbing a flight of stairs?:No Preparing food and eating?:No Bathing or showering?:No Getting dressed?:No Getting to the toilet?:No Using the toilet?:No Moving around from place to place?: No  Fall Risk Assessment In the past year have you fallen or had a near fall?:yes , tripped on rug Are you currently taking any medications that make you dizzy?:No   Hearing Difficulties: somewhat Do you often ask people to speak up or repeat themselves?:No Do you experience ringing or noises in your ears?:No Do you have difficulty understanding soft or whispered voices?:No  Cognitive Testing  Alert? Yes Normal Appearance?Yes  Oriented to person? Yes Place? Yes  Time? Yes  Displays appropriate judgment?Yes  Can read the correct time  from a watch face? yes Are you having problems remembering things?No  Advanced Directives have been discussed with the patient?Yes , already has copy done in 2018   List the Names of Other Physician/Practitioners you currently use:    Indicate any recent Medical Services you may have received from other than [facility] providers in the past year (date may be approximate).     Medicare Attestation  I have personally reviewed:  The patient's medical and social history  Their use of alcohol, tobacco or illicit drugs  Their current medications and supplements  The patient's functional ability including ADLs,fall risks, home safety risks, cognitive, and hearing and visual impairment  Diet and physical activities  Evidence for depression or mood disorders  The patient's weight, height, BMI, and visual acuity have been recorded in the chart. I have made referrals, counseling, and provided education to the patient based on review of the above and I have provided the patient with a written personalized care plan for preventive services.    Physical Exam BP 126/78 (BP Location: Right Arm, Patient Position: Sitting, Cuff Size: Large)   Pulse 72   Resp 12   Ht 5\' 2"  (1.575 m)   Wt 197 lb 1.9 oz (89.4 kg)   SpO2 97% Comment: room air  BMI 36.05 kg/m    Assessment & Plan:  Encounter for Medicare annual wellness exam Annual wellness visit as documented. Counseling done  re healthy lifestyle involving commitment to 150 minutes exercise per week, heart healthy diet, and attaining healthy weight.The importance of adequate sleep also discussed. Regular  seat belt use and home safety, is also discussed. Changes in health habits are decided on by the patient with goals and time frames  set for achieving them. Immunization and cancer screening needs are specifically addressed at this visit.   Need for immunization against influenza After obtaining informed consent, the vaccine is  administered by  LPN.

## 2018-02-20 NOTE — Patient Instructions (Signed)
F/u  In 4 months, call If you need me before  Flu vaccine today  Work on food choice and eating regularly   You will be referred to nutritionist when we are able to find one for you,   Continue excellent exercise routine  Remove carpet/ rug so no  falls

## 2018-02-23 ENCOUNTER — Encounter: Payer: Self-pay | Admitting: Family Medicine

## 2018-02-23 DIAGNOSIS — Z23 Encounter for immunization: Secondary | ICD-10-CM | POA: Insufficient documentation

## 2018-02-23 DIAGNOSIS — Z0000 Encounter for general adult medical examination without abnormal findings: Secondary | ICD-10-CM | POA: Insufficient documentation

## 2018-02-23 NOTE — Assessment & Plan Note (Signed)
After obtaining informed consent, the vaccine is  administered by LPN.  

## 2018-02-23 NOTE — Assessment & Plan Note (Signed)
Annual wellness visit as documented. Counseling done  re healthy lifestyle involving commitment to 150 minutes exercise per week, heart healthy diet, and attaining healthy weight.The importance of adequate sleep also discussed. Regular seat belt use and home safety, is also discussed. Changes in health habits are decided on by the patient with goals and time frames  set for achieving them. Immunization and cancer screening needs are specifically addressed at this visit.

## 2018-03-10 ENCOUNTER — Ambulatory Visit (HOSPITAL_COMMUNITY): Payer: Federal, State, Local not specified - PPO

## 2018-03-13 ENCOUNTER — Inpatient Hospital Stay (HOSPITAL_COMMUNITY): Payer: Federal, State, Local not specified - PPO | Attending: Oncology

## 2018-03-13 VITALS — BP 117/67 | HR 71 | Temp 98.0°F | Resp 18

## 2018-03-13 DIAGNOSIS — E538 Deficiency of other specified B group vitamins: Secondary | ICD-10-CM | POA: Diagnosis present

## 2018-03-13 MED ORDER — CYANOCOBALAMIN 1000 MCG/ML IJ SOLN
1000.0000 ug | Freq: Once | INTRAMUSCULAR | Status: AC
  Administered 2018-03-13: 1000 ug via INTRAMUSCULAR

## 2018-03-13 NOTE — Progress Notes (Signed)
[name] tolerated B12 injection without incident or complaint. VSS. Discharged self ambulatory in satisfactory condition.

## 2018-03-13 NOTE — Patient Instructions (Signed)
[facility] at [facility] _______________________________________________________________  Thank you for choosing [facility] at [facility] to provide your oncology and hematology care.  To afford each patient quality time with our providers, please arrive at least 15 minutes before your scheduled appointment.  You need to re-schedule your appointment if you arrive 10 or more minutes late.  We strive to give you quality time with our providers, and arriving late affects you and other patients whose appointments are after yours.  Also, if you no show three or more times for appointments you may be dismissed from the clinic.  Again, thank you for choosing [facility] at [facility]. Our hope is that these requests will allow you access to exceptional care and in a timely manner. _______________________________________________________________  If you have questions after your visit, please contact our office at [phone] between the hours of 8:30 a.m. and 5:00 p.m. Voicemails left after 4:30 p.m. will not be returned until the following business day. _______________________________________________________________  For prescription refill requests, have your pharmacy contact our office. _______________________________________________________________  Recommendations made by the consultant and any test results will be sent to your referring physician. _______________________________________________________________ 

## 2018-03-14 ENCOUNTER — Other Ambulatory Visit: Payer: Self-pay | Admitting: Family Medicine

## 2018-03-19 ENCOUNTER — Ambulatory Visit (INDEPENDENT_AMBULATORY_CARE_PROVIDER_SITE_OTHER): Payer: Federal, State, Local not specified - PPO | Admitting: Orthopedic Surgery

## 2018-03-19 ENCOUNTER — Ambulatory Visit (INDEPENDENT_AMBULATORY_CARE_PROVIDER_SITE_OTHER): Payer: Federal, State, Local not specified - PPO

## 2018-03-19 VITALS — BP 123/77 | HR 77 | Wt 199.0 lb

## 2018-03-19 DIAGNOSIS — Z96651 Presence of right artificial knee joint: Secondary | ICD-10-CM

## 2018-03-19 NOTE — Progress Notes (Signed)
ANNUAL FOLLOW UP FOR  RIGHT TKA   Chief Complaint  Patient presents with  . Routine Post Op    Right TKR DOS 02/23/15     HPI: The patient is here for the annual  follow-up x-ray for knee replacement. The patient is not complaining of pain weakness instability in the repaired knee she does have postop stiffness  Review of Systems  Musculoskeletal: Positive for joint pain.       Arthritic symptoms left knee    Past Medical History:  Diagnosis Date  . Allergic rhinitis due to pollen   . Anemia   . Diabetes mellitus without complication ([place])   . 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 Obesity, unspecified   . Osteoarthrosis, unspecified whether generalized or localized, lower leg   . Unspecified essential hypertension   . Vitamin B12 deficiency disease 10/28/2008   Qualifier: Diagnosis of  By: Moshe Cipro MD, Margaret       Examination of the RIGHT  KNEE  BP 123/77   Pulse 77   Wt 199 lb (90.3 kg)   BMI 36.40 kg/m   General the patient is normally groomed in no distress  Mood normal Affect pleasant   The patient is Awake and alert ; oriented normal   Inspection shows : incision healed nicely without erythema, no tenderness no swelling  Range of motion total range of motion is 0-90  Stability the knee is stable anterior to posterior as well as medial to lateral  Strength quadriceps strength is normal  Skin no erythema around the skin incision  Cardiovascular NO EDEMA   Neuro: normal sensation in the operative leg   Gait: normal expected gait without cane    Medical decision-making section  X-rays ordered with the following personal interpretation  Normal alignment without loosening   Diagnosis  Encounter Diagnosis  Name Primary?  . 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

## 2018-03-19 NOTE — Patient Instructions (Signed)
Let us know 1 month in advance when you want to have knee surgery so we can see for preop appointment and x-ray left knee

## 2018-03-31 ENCOUNTER — Telehealth: Payer: Self-pay | Admitting: *Deleted

## 2018-03-31 ENCOUNTER — Other Ambulatory Visit: Payer: Self-pay | Admitting: Family Medicine

## 2018-03-31 DIAGNOSIS — M7989 Other specified soft tissue disorders: Secondary | ICD-10-CM

## 2018-03-31 DIAGNOSIS — M79645 Pain in left finger(s): Secondary | ICD-10-CM

## 2018-03-31 NOTE — Telephone Encounter (Signed)
Done

## 2018-03-31 NOTE — Telephone Encounter (Signed)
pls let her k now X ray of the finger is ordered to be done at [facility], she does not need an appt , she can just walk in hopefully she goes one day this week

## 2018-03-31 NOTE — Telephone Encounter (Signed)
Please advise 

## 2018-03-31 NOTE — Progress Notes (Signed)
Dg index left

## 2018-03-31 NOTE — Telephone Encounter (Signed)
Pt stated she fell and left index finger is really swollen and bruised. Stated she showed Dr. Moshe Cipro the last time she was in and it gets better but then it swells back up. Its worse at night. She can bend it but just has pain.

## 2018-04-01 ENCOUNTER — Encounter: Payer: Self-pay | Admitting: Registered"

## 2018-04-01 ENCOUNTER — Encounter: Payer: Federal, State, Local not specified - PPO | Attending: Family | Admitting: Registered"

## 2018-04-01 DIAGNOSIS — E669 Obesity, unspecified: Secondary | ICD-10-CM | POA: Insufficient documentation

## 2018-04-01 NOTE — Patient Instructions (Addendum)
Continue have 3 meals day and consider eating balanced meals, including 1/4 plate starch, 1/4 plate protein, 1/2 non-starching vegetables. Consider creating balanced meals that you like and then you can start meal planning in the future with these meals. Consider including more non-starchy vegetables with lunch and continue having with dinner. When you are 3/4 done with food on your plate, stop and check in with yourself to see if you are satisfied. When having a craving for chips, stop and ask yourself what it is that you really need and see if there is a more effective way to meet your needs. Continue with your exercise including the resistance bands as tolerated. Continue with your plan to check into a [employer] and start water aerobics

## 2018-04-01 NOTE — Progress Notes (Signed)
Medical Nutrition Therapy:  Appt start time: 1430 end time:  5329.  Assessment:  Primary concerns today: patient states she has started gaining weight and wants to prevent going over 200 lbs. Pt reports she had gastric bypass in 2004 in [place]. Pt states she was over 300 lbs prior to surgery. Pt states 3 years ago she felt good at 175 lbs.   Pt states in 2004 she also had a major life change with retirement and moving to [place]  to take care of her mother. Pt states her sisters live in [place] and between them they took care of their mother until her death last 06-11-2022.  Patient states now she feels like their is a hole in her life and needs to find other activities.   Patient states she enjoys staying active and walks most days. Pt states her insurance sent resistance bands last month and they have started monthly check-in calls.   Patient states she is a slow eater, watches TV or computer while eating. Pt states since having the gastric surgery she avoids getting too full because it makes her feel like she needs to throw up.  Sleeps well, especially on days when walking. sleeps 8 hrs.  Pt states she enjoys reading, movies, visiting sisters in [place], [place] activities with churchl. Pt states she plans a girl's weekend 2 times year with friends. Pt states now that she has less responsibility wants to do more traveling.  Preferred Learning Style:   No preference indicated   Learning Readiness:   Ready   MEDICATIONS: reviewed   DIETARY INTAKE:  Usual eating pattern includes 2-3 meals and 1-2 snacks per day.  Avoided foods includes milk - gets sick with even lactose free.    24-hr recall:  B ( AM): 2 egg whites, 2 bacon, toast, coffee with half n half and flavored creamer Snk ( AM): fried apples  L ( PM): none or hot dog OR [place] sandwich Snk ( PM): none D ( PM): cafeteria 1x week: [employer], turnip green, fried liver OR baked potato, pork chop, green beans Snk ( PM):  chips (likes salty snacks) Beverages: diet coke, water 3 bottles, beer occassionally  Usual physical activity: walks every day  Estimated energy needs: 1600 calories  Progress Towards Goal(s):  New goals.   Nutritional Diagnosis:  NI-5.8.5 Inadeqate fiber intake As related to limited whole grains and vegetables in diet.  As evidenced by dietary recall.    Intervention:  Nutrition Education. Discussed balanced eating and importance of vegetables, mindful eating, and exercise.  Plan: Continue have 3 meals day and consider eating balanced meals, including 1/4 plate starch, 1/4 plate protein, 1/2 non-starching vegetables. Consider creating balanced meals that you like and then you can start meal planning in the future with these meals. Consider including more non-starchy vegetables with lunch and continue having with dinner. When you are 3/4 done with food on your plate, stop and check in with yourself to see if you are satisfied. When having a craving for chips, stop and ask yourself what it is that you really need and see if there is a more effective way to meet your needs. Continue with your exercise including the resistance bands as tolerated. Continue with your plan to [facility] into a [employer] and start water aerobics  Teaching Method Utilized:  Visual Auditory  Handouts given during visit include:  [place] lists for [employer]  Barriers to learning/adherence to lifestyle change: none  Demonstrated degree of  understanding via:  Teach Back   Monitoring/Evaluation:  Dietary intake, exercise, and body weight in 4 week(s).

## 2018-04-03 ENCOUNTER — Ambulatory Visit (HOSPITAL_COMMUNITY)
Admission: RE | Admit: 2018-04-03 | Discharge: 2018-04-03 | Disposition: A | Discharge status: Home / Self Care | Payer: Federal, State, Local not specified - PPO | Source: Ambulatory Visit | Attending: Family Medicine | Admitting: Family Medicine

## 2018-04-03 DIAGNOSIS — M7989 Other specified soft tissue disorders: Secondary | ICD-10-CM | POA: Diagnosis present

## 2018-04-03 DIAGNOSIS — M79645 Pain in left finger(s): Secondary | ICD-10-CM | POA: Insufficient documentation

## 2018-04-09 ENCOUNTER — Ambulatory Visit (HOSPITAL_COMMUNITY): Payer: Federal, State, Local not specified - PPO

## 2018-04-10 ENCOUNTER — Encounter (HOSPITAL_COMMUNITY): Payer: Self-pay

## 2018-04-10 ENCOUNTER — Inpatient Hospital Stay (HOSPITAL_COMMUNITY): Payer: Federal, State, Local not specified - PPO | Attending: Oncology

## 2018-04-10 ENCOUNTER — Other Ambulatory Visit: Payer: Self-pay

## 2018-04-10 VITALS — BP 151/73 | HR 71 | Temp 98.0°F | Resp 18

## 2018-04-10 DIAGNOSIS — E538 Deficiency of other specified B group vitamins: Secondary | ICD-10-CM | POA: Insufficient documentation

## 2018-04-10 MED ORDER — CYANOCOBALAMIN 1000 MCG/ML IJ SOLN
1000.0000 ug | Freq: Once | INTRAMUSCULAR | Status: AC
  Administered 2018-04-10: 1000 ug via INTRAMUSCULAR

## 2018-04-10 MED ORDER — CYANOCOBALAMIN 1000 MCG/ML IJ SOLN
INTRAMUSCULAR | Status: AC
  Filled 2018-04-10: qty 1

## 2018-04-10 NOTE — Progress Notes (Signed)
Sheri Jones presents today for injection per the provider's orders.  B12 administration without incident; see MAR for injection details.  Patient tolerated procedure well and without incident.  No questions or complaints noted at this time.  Discharged ambulatory.

## 2018-04-14 ENCOUNTER — Ambulatory Visit (HOSPITAL_COMMUNITY): Payer: Federal, State, Local not specified - PPO

## 2018-04-27 HISTORY — PX: EYE SURGERY: SHX253

## 2018-05-12 ENCOUNTER — Ambulatory Visit (HOSPITAL_COMMUNITY): Payer: Federal, State, Local not specified - PPO

## 2018-05-13 ENCOUNTER — Ambulatory Visit: Payer: Federal, State, Local not specified - PPO | Admitting: Family Medicine

## 2018-05-13 ENCOUNTER — Encounter: Payer: Self-pay | Admitting: Family Medicine

## 2018-05-13 ENCOUNTER — Telehealth: Payer: Self-pay | Admitting: Family Medicine

## 2018-05-13 VITALS — BP 130/70 | HR 68 | Resp 14 | Ht 63.0 in | Wt 199.1 lb

## 2018-05-13 DIAGNOSIS — I10 Essential (primary) hypertension: Secondary | ICD-10-CM

## 2018-05-13 DIAGNOSIS — R7303 Prediabetes: Secondary | ICD-10-CM

## 2018-05-13 DIAGNOSIS — E538 Deficiency of other specified B group vitamins: Secondary | ICD-10-CM | POA: Diagnosis not present

## 2018-05-13 NOTE — Telephone Encounter (Signed)
Noted, thanks!

## 2018-05-13 NOTE — Patient Instructions (Signed)
Annual physical exam in May 2020, call if you need me before  Please work on changed food choice, eating mainly fruit and vegetable, no missed meals, and stop at 7 pm  It is important that you exercise regularly at least 30 minutes 5 times a week. If you develop chest pain, have severe difficulty breathing, or feel very tired, stop exercising immediately and seek medical attention   Weight loss goal of  6 to 8 pounds  Fasting chem 7 and CBC 1 week before May visit  Thank you  for choosing [facility]. We consider it a privelige to serve you.  Delivering excellent health care in a caring and  compassionate way is our goal.  Partnering with you,  so that together we can achieve this goal is our strategy.

## 2018-05-13 NOTE — Telephone Encounter (Signed)
Pt forgot to tell you she was having cataract surgery on 2-5 & 2-25. I wished her all the best

## 2018-05-15 ENCOUNTER — Inpatient Hospital Stay (HOSPITAL_COMMUNITY): Payer: Federal, State, Local not specified - PPO | Attending: Oncology

## 2018-05-15 VITALS — BP 127/67 | HR 66 | Temp 97.4°F | Resp 18

## 2018-05-15 DIAGNOSIS — E538 Deficiency of other specified B group vitamins: Secondary | ICD-10-CM | POA: Diagnosis not present

## 2018-05-15 MED ORDER — CYANOCOBALAMIN 1000 MCG/ML IJ SOLN
1000.0000 ug | Freq: Once | INTRAMUSCULAR | Status: AC
  Administered 2018-05-15: 1000 ug via INTRAMUSCULAR
  Filled 2018-05-15: qty 1

## 2018-05-15 NOTE — Progress Notes (Signed)
Pt presents today for B12 injection. VSS. No complaints of any changes since last visit.   Sheri Jones presents today for injection per MD orders. B12  administered IM in right deltoid. Administration without incident. Patient tolerated well.    No complaints at this time. Discharged from clinic ambulatory. F/U with [facility] as scheduled. Pt spoke with check out pertaining to rescheduling upcoming appts in the [place] due to cataract surgery on that day.

## 2018-05-15 NOTE — Patient Instructions (Signed)
[facility]  Discharge Instructions:   _______________________________________________________________  Thank you for choosing [facility] at [facility] to provide your oncology and hematology care.  To afford each patient quality time with our providers, please arrive at least 15 minutes before your scheduled appointment.  You need to re-schedule your appointment if you arrive 10 or more minutes late.  We strive to give you quality time with our providers, and arriving late affects you and other patients whose appointments are after yours.  Also, if you no show three or more times for appointments you may be dismissed from the clinic.  Again, thank you for choosing  [facility] at [facility]. Our hope is that these requests will allow you access to exceptional care and in a timely manner. _______________________________________________________________  If you have questions after your visit, please contact our office at [phone] between the hours of 8:30 a.m. and 5:00 p.m. Voicemails left after 4:30 p.m. will not be returned until the following business day. _______________________________________________________________  For prescription refill requests, have your pharmacy contact our office. _______________________________________________________________  Recommendations made by the consultant and any test results will be sent to your referring physician. _______________________________________________________________ 

## 2018-05-16 NOTE — Assessment & Plan Note (Signed)
Obesity associated with hypertension and osteoarthritis Deteriorated. Patient re-educated about  the importance of commitment to a  minimum of 150 minutes of exercise per week.  The importance of healthy food choices with portion control discussed. Encouraged to start a food diary, count calories and to consider  joining a support group. Sample diet sheets offered. Goals set by the patient for the next several months.   Weight /BMI 05/13/2018 03/19/2018 02/20/2018  WEIGHT 199 lb 1.9 oz 199 lb 197 lb 1.9 oz  HEIGHT 5\' 3"  - 5\' 2"   BMI 35.27 kg/m2 36.4 kg/m2 36.05 kg/m2

## 2018-05-16 NOTE — Progress Notes (Signed)
Sheri Jones     MRN: [id number]      DOB: 04-03-1946   HPI Sheri Jones is here for follow up and re-evaluation of chronic medical conditions, medication management and review of any available recent lab and radiology data.  Preventive health is updated, specifically  Cancer screening and Immunization.   Questions or concerns regarding consultations or procedures which the PT has had in the interim are  addressed. The PT denies any adverse reactions to current medications since the last visit.  There are no new concerns.  There are no specific complaints   ROS Denies recent fever or chills. Denies sinus pressure, nasal congestion, ear pain or sore throat. Denies chest congestion, productive cough or wheezing. Denies chest pains, palpitations and leg swelling Denies abdominal pain, nausea, vomiting,diarrhea or constipation.   Denies dysuria, frequency, hesitancy or incontinence. Denies joint pain, swelling and limitation in mobility. Denies headaches, seizures, numbness, or tingling. Denies depression, anxiety or insomnia. Denies skin break down or rash.   PE  BP 130/70   Pulse 68   Resp 14   Ht 5\' 3"  (1.6 m)   Wt 199 lb 1.9 oz (90.3 kg)   SpO2 97% Comment: room air  BMI 35.27 kg/m   Patient alert and oriented and in no cardiopulmonary distress.  HEENT: No facial asymmetry, EOMI,   oropharynx pink and moist.  Neck supple no JVD, no mass.  Chest: Clear to auscultation bilaterally.  CVS: S1, S2 no murmurs, no S3.Regular rate.  ABD: Soft nothough reduced n tender.   Ext: No edema  MS: Adequate ROM spine, shoulders, hips and knees.  Skin: Intact, no ulcerations or rash noted.  Psych: Good eye contact, normal affect. Memory intact not anxious or depressed appearing.  CNS: CN 2-12 intact, power,  normal throughout.no focal deficits noted.   Assessment & Plan  Essential hypertension Controlled, no change in medication DASH diet and commitment to daily physical  activity for a minimum of 30 minutes discussed and encouraged, as a part of hypertension management. The importance of attaining a healthy weight is also discussed.  BP/Weight 05/15/2018 05/13/2018 04/10/2018 03/19/2018 03/13/2018 02/20/2018 37/54/3606  Systolic BP 770 340 352 481 859 093 112  Diastolic BP 67 70 73 77 67 78 70  Wt. (Lbs) - 199.12 - 199 - 197.12 -  BMI - 35.27 - 36.4 - 36.05 -       Morbid obesity (HCC) Obesity associated with hypertension and osteoarthritis Deteriorated. Patient re-educated about  the importance of commitment to a  minimum of 150 minutes of exercise per week.  The importance of healthy food choices with portion control discussed. Encouraged to start a food diary, count calories and to consider  joining a support group. Sample diet sheets offered. Goals set by the patient for the next several months.   Weight /BMI 05/13/2018 03/19/2018 02/20/2018  WEIGHT 199 lb 1.9 oz 199 lb 197 lb 1.9 oz  HEIGHT 5\' 3"  - 5\' 2"   BMI 35.27 kg/m2 36.4 kg/m2 36.05 kg/m2      Vitamin B12 deficiency disease B12 administered at Specialty clinic  Prediabetes Patient educated about the importance of limiting  Carbohydrate intake , the need to commit to daily physical activity for a minimum of 30 minutes , and to commit weight loss. The fact that changes in all these areas will reduce or eliminate all together the development of diabetes is stressed.   Diabetic Labs Latest Ref Rng & Units 01/03/2018 12/28/2016 04/11/2016 11/29/2015 06/28/2015  HbA1c <5.7 % - - 5.3 5.2 5.7(H)  Chol <200 mg/dL 182 179 - - 168  HDL >50 mg/dL 75 84 - - 73  Calc LDL mg/dL (calc) 92 82 - - 82  Triglycerides <150 mg/dL 63 51 - - 65  Creatinine 0.50 - 0.99 mg/dL 0.96 0.83 0.93 0.99 0.84   BP/Weight 05/15/2018 05/13/2018 04/10/2018 03/19/2018 03/13/2018 02/20/2018 12/75/1700  Systolic BP 174 944 967 591 638 466 599  Diastolic BP 67 70 73 77 67 78 70  Wt. (Lbs) - 199.12 - 199 - 197.12 -  BMI - 35.27  - 36.4 - 36.05 -   No flowsheet data found.

## 2018-05-16 NOTE — Assessment & Plan Note (Signed)
Patient educated about the importance of limiting  Carbohydrate intake , the need to commit to daily physical activity for a minimum of 30 minutes , and to commit weight loss. The fact that changes in all these areas will reduce or eliminate all together the development of diabetes is stressed.   Diabetic Labs Latest Ref Rng & Units 01/03/2018 12/28/2016 04/11/2016 11/29/2015 06/28/2015  HbA1c <5.7 % - - 5.3 5.2 5.7(H)  Chol <200 mg/dL 182 179 - - 168  HDL >50 mg/dL 75 84 - - 73  Calc LDL mg/dL (calc) 92 82 - - 82  Triglycerides <150 mg/dL 63 51 - - 65  Creatinine 0.50 - 0.99 mg/dL 0.96 0.83 0.93 0.99 0.84   BP/Weight 05/15/2018 05/13/2018 04/10/2018 03/19/2018 03/13/2018 02/20/2018 35/24/8185  Systolic BP 909 311 216 244 695 072 257  Diastolic BP 67 70 73 77 67 78 70  Wt. (Lbs) - 199.12 - 199 - 197.12 -  BMI - 35.27 - 36.4 - 36.05 -   No flowsheet data found.

## 2018-05-16 NOTE — Assessment & Plan Note (Signed)
Controlled, no change in medication DASH diet and commitment to daily physical activity for a minimum of 30 minutes discussed and encouraged, as a part of hypertension management. The importance of attaining a healthy weight is also discussed.  BP/Weight 05/15/2018 05/13/2018 04/10/2018 03/19/2018 03/13/2018 02/20/2018 88/32/5498  Systolic BP 264 158 309 407 680 881 103  Diastolic BP 67 70 73 77 67 78 70  Wt. (Lbs) - 199.12 - 199 - 197.12 -  BMI - 35.27 - 36.4 - 36.05 -

## 2018-05-16 NOTE — Assessment & Plan Note (Signed)
B12 administered at Specialty clinic

## 2018-05-20 ENCOUNTER — Encounter: Payer: Federal, State, Local not specified - PPO | Attending: Family | Admitting: Registered"

## 2018-05-20 DIAGNOSIS — E669 Obesity, unspecified: Secondary | ICD-10-CM | POA: Insufficient documentation

## 2018-05-20 DIAGNOSIS — R7303 Prediabetes: Secondary | ICD-10-CM

## 2018-05-20 NOTE — Patient Instructions (Addendum)
Goals for this month:  Exercise: Put on your calendar in early Feb to call friend that goes [employer] at [employer]. Try strategies to help motivate you to exercise when the weather is not good for walking [place] Calendar on days you exercise to track progress)  Food: Pick up carrots and cottage cheese. Have at least one serving of vegetables with lunch. Consider having menu variety for breakfast, be sure to add protein when having fruit or oatmeal. Experiment with the salad ideas handout.

## 2018-05-20 NOTE — Progress Notes (Signed)
Medical Nutrition Therapy:  Appt start time: 2426 end time:  1230.  Follow-up Assessment:  Primary concerns today: Weight loss & improved health. Pt states she doesn't like how she looks, not happy putting on clothes and going out.  Filed Weights   05/20/18 1238  Weight: 199 lb 8 oz (90.5 kg)   Pt states she is trying real hard to lose weight. Pt brought folder with handouts with diet ideas. Pt states a friend gave her a copy that someone was given in the hospital a long time ago, [place] (grapefruit at every meal), states she tried it for 2 days got sick. Pt states trying to follow 1500 cal meal plan (novonordisk handout)  Pt reports her home was full of sweets and snacks during holidays. Pt states 2 weeks ago she got rid of chips and candy and bought whole wheat bread, broccoli, oranges. Pt reports she is trying to follow her doctor's recommendation to not to eat after seven and drink more water. Pt states today she is going to store to buy more vegetables and proteins.   Exercise: Pt states she walks ~1 mile (from her house to church and back) tries to do every day, 2x last week. Pt states insurance doesn't have silver sneakers. Pt states today she plans to walk with neighbor at 3 pm. Pt reports yesterday her friend stood her up but she went walking by herself and enjoyed it. Pt states next month going to check gym near her, [employer] [facility] far and too expensive. However Pt also states her neighbor invited her to go to [facility] at [employer] to do water aerobics and states she is open to try it. Pt states she is using the insurance provided resistance bands, also started using stepper she found in storage and using video.  Pt states she is scheduled to have cataract surgery Feb 5th & 25th, will be in [place] with sisters for surgery and a few days recovery.  Preferred Learning Style:   No preference indicated   Learning Readiness:   Ready  MEDICATIONS: reviewed   DIETARY INTAKE:  Usual  eating pattern includes 2-3 meals and 1-2 snacks per day.  Avoided foods includes milk - gets sick with even lactose free (seems to be fine with cream in coffee)    24-hr recall:  B (8 AM): almond milk, banana, strawberries at 10:30 egg whites, 2 strips bacon, coffee with half n half and flavored creamer Snk ( AM): orange L ( PM): sandwich on whole wheat, orange, broccoli Snk ( PM): none D ( PM): 1/2 sweet potato, baked chicken breast, cabbage Snk ( PM): wheat thins  Beverages: coffee, water 3 bottles, beer occassionally  Usual physical activity: tries to walk every day, 2 x last week bad weather. Uses resistance bands (tries every day)  Estimated energy needs: 1600 calories  Progress Towards Goal(s):  In progress.   Nutritional Diagnosis:  NI-5.8.5 Inadeqate fiber intake As related to limited whole grains and vegetables in diet.  As evidenced by dietary recall.    Intervention:  Nutrition Education. Discussed importance of vegetables. Discussed need for backup plan when weather isn't good for walking  Goals for this month: Exercise: Put on your calendar in early Feb to call friend that goes [employer] at [employer]. Try strategies to help motivate you to exercise when the weather is not good for walking [place] Calendar on days you exercise to track progress) Food: Pick up carrots and cottage cheese. Have at least one  serving of vegetables with lunch. Consider having menu variety for breakfast, be sure to add protein when having fruit or oatmeal. Experiment with the salad ideas handout.  Teaching Method Utilized:  Visual Auditory  Handouts given during visit include:  Salad Mixing it Up  Barriers to learning/adherence to lifestyle change: none  Demonstrated degree of understanding via:  Teach Back   Monitoring/Evaluation:  Dietary intake, exercise, and body weight in 6 week(s).

## 2018-06-08 ENCOUNTER — Other Ambulatory Visit: Payer: Self-pay | Admitting: Family Medicine

## 2018-06-10 ENCOUNTER — Ambulatory Visit (HOSPITAL_COMMUNITY): Payer: Federal, State, Local not specified - PPO

## 2018-06-10 ENCOUNTER — Other Ambulatory Visit (HOSPITAL_COMMUNITY): Payer: Federal, State, Local not specified - PPO

## 2018-06-10 ENCOUNTER — Ambulatory Visit (HOSPITAL_COMMUNITY): Payer: Federal, State, Local not specified - PPO | Admitting: Hematology

## 2018-06-11 ENCOUNTER — Ambulatory Visit: Payer: Federal, State, Local not specified - PPO | Admitting: Family Medicine

## 2018-06-16 ENCOUNTER — Ambulatory Visit (HOSPITAL_COMMUNITY): Payer: Federal, State, Local not specified - PPO

## 2018-06-16 ENCOUNTER — Ambulatory Visit (HOSPITAL_COMMUNITY): Payer: Federal, State, Local not specified - PPO | Admitting: Hematology

## 2018-06-16 ENCOUNTER — Other Ambulatory Visit (HOSPITAL_COMMUNITY): Payer: Federal, State, Local not specified - PPO

## 2018-06-17 ENCOUNTER — Other Ambulatory Visit (HOSPITAL_COMMUNITY): Payer: Federal, State, Local not specified - PPO

## 2018-06-17 ENCOUNTER — Ambulatory Visit (HOSPITAL_COMMUNITY): Payer: Federal, State, Local not specified - PPO | Admitting: Hematology

## 2018-06-17 ENCOUNTER — Ambulatory Visit (HOSPITAL_COMMUNITY): Payer: Federal, State, Local not specified - PPO

## 2018-06-17 HISTORY — PX: EYE SURGERY: SHX253

## 2018-06-19 ENCOUNTER — Other Ambulatory Visit (HOSPITAL_COMMUNITY): Payer: Self-pay | Admitting: *Deleted

## 2018-06-19 DIAGNOSIS — E538 Deficiency of other specified B group vitamins: Secondary | ICD-10-CM

## 2018-06-20 ENCOUNTER — Other Ambulatory Visit: Payer: Self-pay

## 2018-06-20 ENCOUNTER — Encounter (HOSPITAL_COMMUNITY): Payer: Self-pay | Admitting: Nurse Practitioner

## 2018-06-20 ENCOUNTER — Inpatient Hospital Stay (HOSPITAL_COMMUNITY): Payer: Federal, State, Local not specified - PPO

## 2018-06-20 ENCOUNTER — Inpatient Hospital Stay (HOSPITAL_COMMUNITY): Payer: Federal, State, Local not specified - PPO | Attending: Hematology

## 2018-06-20 ENCOUNTER — Inpatient Hospital Stay (HOSPITAL_BASED_OUTPATIENT_CLINIC_OR_DEPARTMENT_OTHER): Payer: Federal, State, Local not specified - PPO | Admitting: Nurse Practitioner

## 2018-06-20 VITALS — BP 141/59 | HR 70 | Temp 97.6°F | Resp 18 | Ht 62.0 in | Wt 194.9 lb

## 2018-06-20 DIAGNOSIS — E559 Vitamin D deficiency, unspecified: Secondary | ICD-10-CM | POA: Diagnosis not present

## 2018-06-20 DIAGNOSIS — E538 Deficiency of other specified B group vitamins: Secondary | ICD-10-CM | POA: Insufficient documentation

## 2018-06-20 DIAGNOSIS — K909 Intestinal malabsorption, unspecified: Secondary | ICD-10-CM | POA: Diagnosis not present

## 2018-06-20 DIAGNOSIS — Z9884 Bariatric surgery status: Secondary | ICD-10-CM | POA: Diagnosis not present

## 2018-06-20 LAB — COMPREHENSIVE METABOLIC PANEL
ALT: 25 U/L (ref 0–44)
ANION GAP: 6 (ref 5–15)
AST: 26 U/L (ref 15–41)
Albumin: 3.6 g/dL (ref 3.5–5.0)
Alkaline Phosphatase: 62 U/L (ref 38–126)
BUN: 28 mg/dL — ABNORMAL HIGH (ref 8–23)
CO2: 25 mmol/L (ref 22–32)
Calcium: 8.7 mg/dL — ABNORMAL LOW (ref 8.9–10.3)
Chloride: 108 mmol/L (ref 98–111)
Creatinine, Ser: 1 mg/dL (ref 0.44–1.00)
GFR calc Af Amer: >60 mL/min (ref >60)
GFR calc non Af Amer: 57 mL/min — ABNORMAL LOW (ref >60)
Glucose, Bld: 91 mg/dL (ref 70–99)
Potassium: 4.5 mmol/L (ref 3.5–5.1)
Sodium: 139 mmol/L (ref 135–145)
Total Bilirubin: 0.6 mg/dL (ref 0.3–1.2)
Total Protein: 6.5 g/dL (ref 6.5–8.1)

## 2018-06-20 LAB — CBC WITH DIFFERENTIAL/PLATELET
Abs Immature Granulocytes: 0.01 10*3/uL (ref 0.00–0.07)
BASOS PCT: 1 %
Basophils Absolute: 0 10*3/uL (ref 0.0–0.1)
EOS PCT: 4 %
Eosinophils Absolute: 0.2 10*3/uL (ref 0.0–0.5)
HCT: 39.4 % (ref 36.0–46.0)
Hemoglobin: 12.8 g/dL (ref 12.0–15.0)
Immature Granulocytes: 0 %
Lymphocytes Relative: 37 %
Lymphs Abs: 1.7 10*3/uL (ref 0.7–4.0)
MCH: 30.7 pg (ref 26.0–34.0)
MCHC: 32.5 g/dL (ref 30.0–36.0)
MCV: 94.5 fL (ref 80.0–100.0)
Monocytes Absolute: 0.5 10*3/uL (ref 0.1–1.0)
Monocytes Relative: 11 %
Neutro Abs: 2.2 10*3/uL (ref 1.7–7.7)
Neutrophils Relative %: 47 %
PLATELETS: 156 10*3/uL (ref 150–400)
RBC: 4.17 MIL/uL (ref 3.87–5.11)
RDW: 15.3 % (ref 11.5–15.5)
WBC: 4.6 10*3/uL (ref 4.0–10.5)
nRBC: 0 % (ref 0.0–0.2)

## 2018-06-20 LAB — VITAMIN B12: Vitamin B-12: 873 pg/mL (ref 180–914)

## 2018-06-20 LAB — LACTATE DEHYDROGENASE: LDH: 162 U/L (ref 98–192)

## 2018-06-20 MED ORDER — CYANOCOBALAMIN 1000 MCG/ML IJ SOLN
INTRAMUSCULAR | Status: AC
  Filled 2018-06-20: qty 1

## 2018-06-20 MED ORDER — CYANOCOBALAMIN 1000 MCG/ML IJ SOLN
1000.0000 ug | Freq: Once | INTRAMUSCULAR | Status: AC
  Administered 2018-06-20: 1000 ug via INTRAMUSCULAR

## 2018-06-20 NOTE — Assessment & Plan Note (Signed)
1.  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 - She remains on monthly B12 injections.  She tolerates these injections very well.  We discussed the option of her administering these injections at home to herself.  She does not feel comfortable at this time. - Labs reviewed with patient in detail all within normal limits. -We will continue the monthly B12 injections. - She will return for follow-up visit in 1 year with labs.

## 2018-06-20 NOTE — Progress Notes (Signed)
Sheri Jones presents today for injection per MD orders. B12  administered IM  in left Upper Arm. Administration without incident. Patient tolerated well.

## 2018-06-20 NOTE — Patient Instructions (Signed)
[facility] at [facility]  Discharge Instructions:   _______________________________________________________________  Thank you for choosing [facility] at [facility] to provide your oncology and hematology care.  To afford each patient quality time with our providers, please arrive at least 15 minutes before your scheduled appointment.  You need to re-schedule your appointment if you arrive 10 or more minutes late.  We strive to give you quality time with our providers, and arriving late affects you and other patients whose appointments are after yours.  Also, if you no show three or more times for appointments you may be dismissed from the clinic.  Again, thank you for choosing [facility] at [facility]. Our hope is that these requests will allow you access to exceptional care and in a timely manner. _______________________________________________________________  If you have questions after your visit, please contact our office at [phone] between the hours of 8:30 a.m. and 5:00 p.m. Voicemails left after 4:30 p.m. will not be returned until the following business day. _______________________________________________________________  For prescription refill requests, have your pharmacy contact our office. _______________________________________________________________  Recommendations made by the consultant and any test results will be sent to your referring physician. _______________________________________________________________ 

## 2018-06-20 NOTE — Patient Instructions (Addendum)
[facility] at [facility] Discharge Instructions  Follow up in 1 year with labs    Thank you for choosing [facility] at [facility] to provide your oncology and hematology care.  To afford each patient quality time with our provider, please arrive at least 15 minutes before your scheduled appointment time.   If you have a lab appointment with the Cancer Center please come in thru the  Main Entrance and check in at the main information desk  You need to re-schedule your appointment should you arrive 10 or more minutes late.  We strive to give you quality time with our providers, and arriving late affects you and other patients whose appointments are after yours.  Also, if you no show three or more times for appointments you may be dismissed from the clinic at the providers discretion.     Again, thank you for choosing [facility].  Our hope is that these requests will decrease the amount of time that you wait before being seen by our physicians.       _____________________________________________________________  Should you have questions after your visit to [facility], please contact our office at [phone] between the hours of 8:00 a.m. and 4:30 p.m.  Voicemails left after 4:00 p.m. will not be returned until the following business day.  For prescription refill requests, have your pharmacy contact our office and allow 72 hours.    Cancer Center Support Programs:   > Cancer Support Group  2nd Tuesday of the month 1pm-2pm, Journey Room    

## 2018-06-20 NOTE — Progress Notes (Signed)
[place], [place] [address]   CLINIC:  Medical Oncology/Hematology  PCP:  Fayrene Helper, MD [address], [address] [place] [address] [phone]   REASON FOR VISIT: Follow-up for Vitamin B12 deficiency   CURRENT THERAPY: Vitamin B12 inj monthly    INTERVAL HISTORY:  Sheri Jones 73 y.o. female returns for routine follow-up for B 12 deficiency. She is doing well since her last visit.  She reports her energy level and appetite have been good. She reports after her mother died a year ago she has been drinking whiskey daily. She also gained about 15 pounds. Recently she has been seeing a nutrition doctor and she has stopped drinking completely and lost 10 pounds. She has been walking everyday the weather is nice and she is thinking about joining the gym again. She had left eye cataract surgery on 05/28/2018 and right eye cataract surgery last Tuesday on 06/17/2018.  She reports that she will need a left knee replacement sometime this year she will follow-up with her orthopedic doctor to get her surgery date. Denies any nausea, vomiting, or diarrhea. Denies any new pains. Had not noticed any recent bleeding such as epistaxis, hematuria or hematochezia. Denies recent chest pain on exertion, shortness of breath on minimal exertion, pre-syncopal episodes, or palpitations. Denies any numbness or tingling in hands or feet. Denies any recent fevers, infections, or recent hospitalizations. Patient reports appetite at 75% and energy level at 75%.  She is living alone and can perform all her own ADLs and activities with no problems.   REVIEW OF SYSTEMS:  Review of Systems  Eyes: Positive for eye problems (Cataract surgery).  All other systems reviewed and are negative.    PAST MEDICAL/SURGICAL HISTORY:  Past Medical History:  Diagnosis Date  . Allergic rhinitis due to pollen   . Anemia   . Diabetes mellitus without complication ([place])   . 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 Obesity, unspecified   . Osteoarthrosis, unspecified whether generalized or localized, lower leg   . Unspecified essential hypertension   . Vitamin B12 deficiency disease 10/28/2008   Qualifier: Diagnosis of  By: Moshe Cipro MD, Joycelyn Schmid     Past Surgical History:  Procedure Laterality Date  . COLONOSCOPY  2008  . EXAM UNDER ANESTHESIA WITH MANIPULATION OF KNEE Right 05/11/2015   Procedure: EXAM UNDER ANESTHESIA WITH MANIPULATION OF KNEE;  Surgeon: Carole Civil, MD;  Location: [facility];  Service: Orthopedics;  Laterality: Right;  . GASTRIC BYPASS  2004  . TOTAL KNEE ARTHROPLASTY Right 02/23/2015   Procedure: RIGHT TOTAL KNEE ARTHROPLASTY;  Surgeon: Carole Civil, MD;  Location: [facility];  Service: Orthopedics;  Laterality: Right;     SOCIAL HISTORY:  Social History   Socioeconomic History  . Marital status: Single    Spouse name: Not on file  . Number of children: Not on file  . Years of education: Not on file  . Highest education level: Not on file  Occupational History  . Not on file  Social Needs  . Financial resource strain: Not on file  . Food insecurity:    Worry: Not on file    Inability: Not on file  . Transportation needs:    Medical: Not on file    Non-medical: Not on file  Tobacco Use  . Smoking status: Never Smoker  . Smokeless tobacco: Never Used  Substance and Sexual Activity  . Alcohol use: No  .  Drug use: No  . Sexual activity: Not Currently  Lifestyle  . Physical activity:    Days per week: Not on file    Minutes per session: Not on file  . Stress: Not on file  Relationships  . Social connections:    Talks on phone: Not on file    Gets together: Not on file    Attends religious service: Not on file    Active member of club or organization: Not on file    Attends meetings of clubs or organizations: Not on file    Relationship status: Not on file  . Intimate partner violence:    Fear of current or ex  partner: Not on file    Emotionally abused: Not on file    Physically abused: Not on file    Forced sexual activity: Not on file  Other Topics Concern  . Not on file  Social History Narrative  . Not on file    FAMILY HISTORY:  Family History  Problem Relation Age of Onset  . Hypertension Mother   . Hypertension Sister   . Hypertension Sister   . Stroke Sister 62  . Hypertension Sister   . Heart disease Father 27       massive heart attack   . Breast cancer Maternal Aunt   . Lung cancer Maternal Aunt     CURRENT MEDICATIONS:  Outpatient Encounter Medications as of 06/20/2018  Medication Sig  . aspirin 81 MG tablet Take 81 mg by mouth daily.  . Calcium-Vitamin D 500-125 MG-UNIT TABS Take 1 tablet by mouth daily.   . cetirizine (ZYRTEC) 10 MG tablet Take 10 mg by mouth daily. Reported on 10/24/2015  . cyanocobalamin (,VITAMIN B-12,) 1000 MCG/ML injection Inject 1,000 mcg into the muscle every 30 (thirty) days.  . diphenhydrAMINE (BENADRYL) 25 mg capsule Take 25 mg by mouth every 6 (six) hours as needed.  . hydrochlorothiazide (HYDRODIURIL) 25 MG tablet TAKE 1 TABLET BY MOUTH DAILY  . meclizine (ANTIVERT) 25 MG tablet Take 1 tablet (25 mg total) by mouth 3 (three) times daily as needed for dizziness.  . meloxicam (MOBIC) 15 MG tablet Take 1 tablet (15 mg total) by mouth daily.  . Multiple Vitamins-Minerals (CENTRUM ULTRA WOMENS PO) Take 1 tablet by mouth daily.    . potassium chloride (K-DUR) 10 MEQ tablet TAKE 1 TABLET BY MOUTH 2 TIMES DAILY.  . [DISCONTINUED] hydrochlorothiazide (HYDRODIURIL) 25 MG tablet TAKE 1 TABLET BY MOUTH DAILY   No facility-administered encounter medications on file as of 06/20/2018.     ALLERGIES:  Allergies  Allergen Reactions  . Eggs Or Egg-Derived Products Diarrhea  . Milk-Related Compounds     Lactose intolerant      PHYSICAL EXAM:  ECOG Performance status: 1  Vitals:   06/20/18 1245  BP: (!) 141/59  Pulse: 70  Resp: 18  Temp: 97.6 F  (36.4 C)  SpO2: 100%   Filed Weights   06/20/18 1245  Weight: 194 lb 14.4 oz (88.4 kg)    Physical Exam Constitutional:      Appearance: Normal appearance. She is normal weight.  Cardiovascular:     Rate and Rhythm: Normal rate and regular rhythm.     Heart sounds: Normal heart sounds.  Pulmonary:     Effort: Pulmonary effort is normal.     Breath sounds: Normal breath sounds.  Abdominal:     General: Bowel sounds are normal.     Palpations: Abdomen is soft.  Musculoskeletal: Normal range  of motion.  Skin:    General: Skin is warm and dry.  Neurological:     Mental Status: She is alert and oriented to person, place, and time. Mental status is at baseline.  Psychiatric:        Mood and Affect: Mood normal.        Behavior: Behavior normal.        Thought Content: Thought content normal.        Judgment: Judgment normal.      LABORATORY DATA:  I have reviewed the labs as listed.  CBC    Component Value Date/Time   WBC 4.6 06/20/2018 1143   RBC 4.17 06/20/2018 1143   HGB 12.8 06/20/2018 1143   HCT 39.4 06/20/2018 1143   PLT 156 06/20/2018 1143   MCV 94.5 06/20/2018 1143   MCH 30.7 06/20/2018 1143   MCHC 32.5 06/20/2018 1143   RDW 15.3 06/20/2018 1143   LYMPHSABS 1.7 06/20/2018 1143   MONOABS 0.5 06/20/2018 1143   EOSABS 0.2 06/20/2018 1143   BASOSABS 0.0 06/20/2018 1143   CMP Latest Ref Rng & Units 06/20/2018 01/03/2018 12/28/2016  Glucose 70 - 99 mg/dL 91 83 71  BUN 8 - 23 mg/dL 28(H) 21 18  Creatinine 0.44 - 1.00 mg/dL 1.00 0.96 0.83  Sodium 135 - 145 mmol/L 139 143 140  Potassium 3.5 - 5.1 mmol/L 4.5 4.1 3.6  Chloride 98 - 111 mmol/L 108 107 103  CO2 22 - 32 mmol/L 25 27 29   Calcium 8.9 - 10.3 mg/dL 8.7(L) 9.3 9.3  Total Protein 6.5 - 8.1 g/dL 6.5 6.3 -  Total Bilirubin 0.3 - 1.2 mg/dL 0.6 0.7 -  Alkaline Phos 38 - 126 U/L 62 - -  AST 15 - 41 U/L 26 27 -  ALT 0 - 44 U/L 25 20 -     I personally performed a face-to-face visit, made revisions and my  assessment and plan is as follows.    ASSESSMENT & PLAN:   Vitamin D deficiency 1.  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 - She remains on monthly B12 injections.  She tolerates these injections very well.  We discussed the option of her administering these injections at home to herself.  She does not feel comfortable at this time. - Labs reviewed with patient in detail all within normal limits. -We will continue the monthly B12 injections. - She will return for follow-up visit in 1 year with labs.      Orders placed this encounter:  Orders Placed This Encounter  Procedures  . VITAMIN D 25 Hydroxy (Vit-D Deficiency, Fractures)  . Vitamin B12  . Folate  . CBC with Differential/Platelet  . Comprehensive metabolic panel      Derek Jack, MD [place] [phone]

## 2018-06-25 ENCOUNTER — Encounter: Payer: Federal, State, Local not specified - PPO | Attending: Family | Admitting: Registered"

## 2018-06-25 VITALS — Ht 62.0 in | Wt 200.9 lb

## 2018-06-25 DIAGNOSIS — R7303 Prediabetes: Secondary | ICD-10-CM

## 2018-06-25 DIAGNOSIS — E669 Obesity, unspecified: Secondary | ICD-10-CM | POA: Diagnosis not present

## 2018-06-25 NOTE — Patient Instructions (Addendum)
Ask Dr. Moshe Cipro about your A1c. Last one I see is dated 04/11/2016. Call or go see friend that goes [employer] at [employer] Go check out the gym near you. Continue increasing the distance and days per week that you walk as tolerated. Remember to eat protein when you eat fruit. Look at your salald idea handout from last visit to get some variety with your salads so you don't get bored. Also look for the pre-made salads with dressing.  Grocery list:  Cottage cheese is a good to mix with fruit (peaches in lite syrup) Try plain [place] yogurt in your smoothie for protein Whole wheat bread look for "100% whole.Marland KitchenMarland Kitchen"

## 2018-06-25 NOTE — Progress Notes (Signed)
Medical Nutrition Therapy:  Appt start time: 3295 end time:  1225.  Follow-up Assessment:  Primary concerns today: Weight loss & improved health. Pt states she has made changes and had lost a few pounds and doesn't know why her weight weight back up. Because patient also has a concern of pre-diabetes, RD asked her to find out what her latest A1c is. Last one in chart is from Dec 2017 5.7%  Wt Readings from Last 3 Encounters:  06/25/18 200 lb 14.4 oz (91.1 kg)  06/20/18 194 lb 14.4 oz (88.4 kg)  05/20/18 199 lb 8 oz (90.5 kg)   Pt states she has been talking with her sister in [place] who is giving her ideas to prepare different vegetables and has increased amount and variety since last visit. Pt states she still needs to drink more water.   Pt states during the holidays she was dealing with grief and was drinking too much alcohol including vodka, but after talking to sister who is also grieving feels like she is able to leave the alcohol alone. Pt states declines RD offer to provide counseling resources.  Exercise: Pt states she still walks to church but has increased distance to walk around the neighborhood as well. Pt states she still has a goal of walking every day, states weather is what limits her walked 3 days last week. Pt states 2x last week use stepper in garage, used resistance bands. Pt states her goal is 4x days per week, last week 2x.   Pt states cataract surgery last month went well and next she needs to schedule knee replacement surgery. Pt states her niece may be able to stay with her during recovery.   Preferred Learning Style:   No preference indicated   Learning Readiness:   Ready  MEDICATIONS: reviewed   DIETARY INTAKE:  Usual eating pattern includes 2-3 meals and 1-2 snacks per day.  Avoided foods includes milk - gets sick with even lactose free (seems to be fine with cream in coffee)    24-hr recall:  B (8 AM): 3 egg whites, coffee a little hazelnut creamer Snk  ( AM): fruit L ( PM): 1/2 [place] sandwich on whole wheat, fruit OR chicken salad Snk ( PM): none D ( PM): chicken strip, vegetables, potato Snk ( PM): small box raisins Beverages: 1 c coffee, water -4 bottles  Usual physical activity: tries to walk every day, 3x last week due to bad weather. Uses resistance bands 3x (tries every day)  Estimated energy needs: 1600 calories  Progress Towards Goal(s):  In progress.   Nutritional Diagnosis:  NI-5.8.5 Inadeqate fiber intake As related to limited whole grains and vegetables in diet.  As evidenced by dietary recall.    Intervention:  Nutrition Education. Discussed eating balanced snacks for BG control. Discussed neeher isn't good for walking  Goals for this month: Ask Dr. Moshe Cipro about your [re-diabetes diagnosis and A1c to see if she wants to run an updated lab. Call or go see friend that goes [employer] at [employer] Go check out the gym near you. Continue increasing the distance and days per week that you walk as tolerated. Remember to eat protein when you eat fruit. Look at your salald idea handout from last visit to get some variety with your salads so you don't get bored. Also look for the pre-made salads with dressing.  Grocery list:  Cottage cheese is a good to mix with fruit (peaches in lite syrup) Try plain [place] yogurt in your  smoothie for protein Whole wheat bread look for "100% whole..."  Teaching Method Utilized:  Visual Auditory  Handouts given during visit include:  Snack sheet  Barriers to learning/adherence to lifestyle change: none  Demonstrated degree of understanding via:  Teach Back   Monitoring/Evaluation:  Dietary intake, exercise, and body weight in 6 week(s).

## 2018-07-16 ENCOUNTER — Telehealth: Payer: Self-pay | Admitting: *Deleted

## 2018-07-16 NOTE — Telephone Encounter (Signed)
Called patient to discuss treatment plan. No answer. Left generic message requesting call back.

## 2018-07-16 NOTE — Telephone Encounter (Signed)
Please advise 

## 2018-07-16 NOTE — Telephone Encounter (Signed)
Pt called and said she normally take B12 shots every month at the hospital however this month they called and cancelled and rescheduled for 08/21/18. It was due 07/21/18. She wanted to know if Dr. Moshe Cipro would recommend her taking 5000mg  B12 over the counter during the time that she waits . She can be reached at [phone] or [phone]

## 2018-07-17 NOTE — Telephone Encounter (Signed)
Spoke with patient and advised of treatment plan with verbal understanding

## 2018-07-21 ENCOUNTER — Ambulatory Visit (HOSPITAL_COMMUNITY): Payer: Federal, State, Local not specified - PPO

## 2018-08-07 ENCOUNTER — Ambulatory Visit: Payer: Federal, State, Local not specified - PPO | Admitting: Registered"

## 2018-08-20 ENCOUNTER — Other Ambulatory Visit: Payer: Self-pay

## 2018-08-21 ENCOUNTER — Encounter (HOSPITAL_COMMUNITY): Payer: Self-pay

## 2018-08-21 ENCOUNTER — Other Ambulatory Visit: Payer: Self-pay

## 2018-08-21 ENCOUNTER — Inpatient Hospital Stay (HOSPITAL_COMMUNITY): Payer: Federal, State, Local not specified - PPO | Attending: Hematology

## 2018-08-21 VITALS — BP 136/72 | HR 67 | Temp 97.8°F | Resp 18

## 2018-08-21 DIAGNOSIS — E538 Deficiency of other specified B group vitamins: Secondary | ICD-10-CM | POA: Diagnosis present

## 2018-08-21 MED ORDER — CYANOCOBALAMIN 1000 MCG/ML IJ SOLN
INTRAMUSCULAR | Status: AC
  Filled 2018-08-21: qty 1

## 2018-08-21 MED ORDER — CYANOCOBALAMIN 1000 MCG/ML IJ SOLN
1000.0000 ug | Freq: Once | INTRAMUSCULAR | Status: AC
  Administered 2018-08-21: 1000 ug via INTRAMUSCULAR

## 2018-09-01 ENCOUNTER — Other Ambulatory Visit: Payer: Self-pay | Admitting: Family Medicine

## 2018-09-04 ENCOUNTER — Encounter: Payer: Federal, State, Local not specified - PPO | Admitting: Family Medicine

## 2018-09-08 ENCOUNTER — Other Ambulatory Visit: Payer: Self-pay | Admitting: Family Medicine

## 2018-09-08 ENCOUNTER — Telehealth: Payer: Self-pay | Admitting: Family Medicine

## 2018-09-08 MED ORDER — PANTOPRAZOLE SODIUM 20 MG PO TBEC: 20.0000 mg | DELAYED_RELEASE_TABLET | Freq: Every day | ORAL | 1 refills | Status: DC

## 2018-09-08 NOTE — Telephone Encounter (Signed)
Med sent , generic protonix, renmind her to keep appt next week also pls

## 2018-09-08 NOTE — Progress Notes (Signed)
protonix

## 2018-09-08 NOTE — Telephone Encounter (Signed)
Please advise 

## 2018-09-08 NOTE — Telephone Encounter (Signed)
Pt says she has a lot of gas and reflux and is requesting medication for this.  Please call

## 2018-09-08 NOTE — Telephone Encounter (Signed)
Spoke with patient and advised her generic protonix has been called in and she needs to keep appt next week with verbal understanding.

## 2018-09-09 ENCOUNTER — Other Ambulatory Visit: Payer: Self-pay | Admitting: Orthopedic Surgery

## 2018-09-09 DIAGNOSIS — Z96651 Presence of right artificial knee joint: Secondary | ICD-10-CM

## 2018-09-12 LAB — CBC
HCT: 40.1 % (ref 35.0–45.0)
HEMOGLOBIN: 13.2 g/dL (ref 11.7–15.5)
MCH: 31.3 pg (ref 27.0–33.0)
MCHC: 32.9 g/dL (ref 32.0–36.0)
MCV: 95 fL (ref 80.0–100.0)
MPV: 11.6 fL (ref 7.5–12.5)
Platelets: 208 10*3/uL (ref 140–400)
RBC: 4.22 10*6/uL (ref 3.80–5.10)
RDW: 13 % (ref 11.0–15.0)
WBC: 4.6 10*3/uL (ref 3.8–10.8)

## 2018-09-12 LAB — BASIC METABOLIC PANEL WITH GFR
BUN/Creatinine Ratio: 20 (calc) (ref 6–22)
BUN: 19 mg/dL (ref 7–25)
CALCIUM: 9.3 mg/dL (ref 8.6–10.4)
CHLORIDE: 103 mmol/L (ref 98–110)
CO2: 29 mmol/L (ref 20–32)
Creat: 0.94 mg/dL — ABNORMAL HIGH (ref 0.60–0.93)
GFR, EST AFRICAN AMERICAN: 71 mL/min/{1.73_m2} (ref >=60)
GFR, Est Non African American: 61 mL/min/{1.73_m2} (ref >=60)
Glucose, Bld: 81 mg/dL (ref 65–99)
POTASSIUM: 3.8 mmol/L (ref 3.5–5.3)
Sodium: 141 mmol/L (ref 135–146)

## 2018-09-17 ENCOUNTER — Encounter: Payer: Self-pay | Admitting: Family Medicine

## 2018-09-17 ENCOUNTER — Ambulatory Visit (INDEPENDENT_AMBULATORY_CARE_PROVIDER_SITE_OTHER): Payer: Federal, State, Local not specified - PPO | Admitting: Family Medicine

## 2018-09-17 ENCOUNTER — Other Ambulatory Visit: Payer: Self-pay

## 2018-09-17 VITALS — BP 118/72 | HR 67 | Temp 98.3°F | Resp 16 | Ht 62.0 in | Wt 200.0 lb

## 2018-09-17 DIAGNOSIS — R7301 Impaired fasting glucose: Secondary | ICD-10-CM

## 2018-09-17 DIAGNOSIS — I10 Essential (primary) hypertension: Secondary | ICD-10-CM

## 2018-09-17 DIAGNOSIS — Z1231 Encounter for screening mammogram for malignant neoplasm of breast: Secondary | ICD-10-CM

## 2018-09-17 DIAGNOSIS — R7303 Prediabetes: Secondary | ICD-10-CM

## 2018-09-17 DIAGNOSIS — E559 Vitamin D deficiency, unspecified: Secondary | ICD-10-CM

## 2018-09-17 DIAGNOSIS — Z0000 Encounter for general adult medical examination without abnormal findings: Secondary | ICD-10-CM | POA: Diagnosis not present

## 2018-09-17 NOTE — Patient Instructions (Addendum)
F/U in 5 months, call if you need me sooner  Please get fasting lipid, chem 7 and EGFr, TSH, HBA1C and vit D   You are referred for mammogram in September when due, we will schedule   It is important that you exercise regularly at least 30 minutes 5 times a week. If you develop chest pain, have severe difficulty breathing, or feel very tired, stop exercising immediately and seek medical attention   Think about what you will eat, plan ahead. Choose " clean, green, fresh or frozen" over canned, processed or packaged foods which are more sugary, salty and fatty. 70 to 75% of food eaten should be vegetables and fruit. Three meals at set times with snacks allowed between meals, but they must be fruit or vegetables. Aim to eat over a 12 hour period , example 7 am to 7 pm, and STOP after  your last meal of the day. Drink water,generally about 64 ounces per day, no other drink is as healthy. Fruit juice is best enjoyed in a healthy way, by EATING the fruit.   Weight loss goal of 8 to 10 pounds       Food Choices for Gastroesophageal Reflux Disease, Adult When you have gastroesophageal reflux disease (GERD), the foods you eat and your eating habits are very important. Choosing the right foods can help ease your discomfort. Think about working with a nutrition specialist (dietitian) to help you make good choices. What are tips for following this plan?  Meals  Choose healthy foods that are low in fat, such as fruits, vegetables, whole grains, low-fat dairy products, and lean meat, fish, and poultry.  Eat small meals often instead of 3 large meals a day. Eat your meals slowly, and in a place where you are relaxed. Avoid bending over or lying down until 2-3 hours after eating.  Avoid eating meals 2-3 hours before bed.  Avoid drinking a lot of liquid with meals.  Cook foods using methods other than frying. Bake, grill, or broil food instead.  Avoid or limit: ? Chocolate. ? Peppermint or  spearmint. ? Alcohol. ? Pepper. ? Black and decaffeinated coffee. ? Black and decaffeinated tea. ? Bubbly (carbonated) soft drinks. ? Caffeinated energy drinks and soft drinks.  Limit high-fat foods such as: ? Fatty meat or fried foods. ? Whole milk, cream, butter, or ice cream. ? Nuts and nut butters. ? Pastries, donuts, and sweets made with butter or shortening.  Avoid foods that cause symptoms. These foods may be different for everyone. Common foods that cause symptoms include: ? Tomatoes. ? Oranges, lemons, and limes. ? Peppers. ? Spicy food. ? Onions and garlic. ? Vinegar. Lifestyle  Maintain a healthy weight. Ask your doctor what weight is healthy for you. If you need to lose weight, work with your doctor to do so safely.  Exercise for at least 30 minutes for 5 or more days each week, or as told by your doctor.  Wear loose-fitting clothes.  Do not smoke. If you need help quitting, ask your doctor.  Sleep with the head of your bed higher than your feet. Use a wedge under the mattress or blocks under the bed frame to raise the head of the bed. Summary  When you have gastroesophageal reflux disease (GERD), food and lifestyle choices are very important in easing your symptoms.  Eat small meals often instead of 3 large meals a day. Eat your meals slowly, and in a place where you are relaxed.  Limit high-fat foods  such as fatty meat or fried foods.  Avoid bending over or lying down until 2-3 hours after eating.  Avoid peppermint and spearmint, caffeine, alcohol, and chocolate. This information is not intended to replace advice given to you by your health care provider. Make sure you discuss any questions you have with your health care provider. Document Released: 10/09/2011 Document Revised: 05/15/2016 Document Reviewed: 05/15/2016 Elsevier Interactive Patient Education  2019 [employer].

## 2018-09-17 NOTE — Progress Notes (Signed)
    Sheri Jones     MRN: [id number]      DOB: 12-23-1945  HPI: Patient is in for annual physical exam. No other health concerns are expressed or addressed at the visit. Recent labs,  are reviewed and are good  PE: BP 118/72   Pulse 67   Temp 98.3 F (36.8 C) (Temporal)   Resp 16   Ht 5\' 2"  (1.575 m)   Wt 200 lb (90.7 kg)   SpO2 97%   BMI 36.58 kg/m   Pleasant  female, alert and oriented x 3, in no cardio-pulmonary distress. Afebrile. HEENT No facial trauma or asymetry. Sinuses non tender.  Extra occullar muscles intact, External ears normal, . Oropharynx moist, no exudate. Neck: supple, no adenopathy,JVD or thyromegaly.No bruits.  Chest: Clear to ascultation bilaterally.No crackles or wheezes. Non tender to palpation    Cardiovascular system; Heart sounds normal,  S1 and  S2 ,no S3.  No murmur, or thrill. Apical beat not displaced Peripheral pulses normal.  Abdomen: Soft, non tender, no organomegaly or masses. No bruits. Bowel sounds normal. No guarding, tenderness or rebound.    Musculoskeletal exam: Decreased though adequate  ROM of spine, hips , shoulders and knees.  deformity ,swelling and  crepitus noted. No muscle wasting or atrophy.   Neurologic: Cranial nerves 2 to 12 intact. Power, tone ,sensation and reflexes normal throughout.  No tremor.  Skin: Intact, no ulceration, erythema , scaling or rash noted. Pigmentation normal throughout  Psych; Normal mood and affect. Judgement and concentration normal   Assessment & Plan:  Annual physical exam Annual exam as documented. Counseling done  re healthy lifestyle involving commitment to 150 minutes exercise per week, heart healthy diet, and attaining healthy weight.The importance of adequate sleep also discussed. Changes in health habits are decided on by the patient with goals and time frames  set for achieving them. Immunization and cancer screening needs are specifically addressed at this  visit.   Morbid obesity ([place]) Obesity associated with hypertension and arthritis Patient re-educated about  the importance of commitment to a  minimum of 150 minutes of exercise per week as able.  The importance of healthy food choices with portion control discussed, as well as eating regularly and within a 12 hour window most days. The need to choose "clean , green" food 50 to 75% of the time is discussed, as well as to make water the primary drink and set a goal of 64 ounces water daily.  Encouraged to start a food diary,  and to consider  joining a support group. Sample diet sheets offered. Goals set by the patient for the next several months.   Weight /BMI 09/17/2018 06/25/2018 06/20/2018  WEIGHT 200 lb 200 lb 14.4 oz 194 lb 14.4 oz  HEIGHT 5\' 2"  5\' 2"  5\' 2"   BMI 36.58 kg/m2 36.75 kg/m2 35.65 kg/m2

## 2018-09-19 ENCOUNTER — Other Ambulatory Visit: Payer: Self-pay

## 2018-09-19 ENCOUNTER — Inpatient Hospital Stay (HOSPITAL_COMMUNITY): Payer: Federal, State, Local not specified - PPO | Attending: Hematology

## 2018-09-19 VITALS — BP 138/78 | HR 67 | Temp 98.0°F | Resp 16

## 2018-09-19 DIAGNOSIS — E538 Deficiency of other specified B group vitamins: Secondary | ICD-10-CM | POA: Insufficient documentation

## 2018-09-19 MED ORDER — CYANOCOBALAMIN 1000 MCG/ML IJ SOLN
1000.0000 ug | Freq: Once | INTRAMUSCULAR | Status: AC
  Administered 2018-09-19: 1000 ug via INTRAMUSCULAR
  Filled 2018-09-19: qty 1

## 2018-09-19 NOTE — Progress Notes (Signed)
Sheri Jones presents today for injection per the provider's orders.  B12 administration without incident; see MAR for injection details.  Patient tolerated procedure well and without incident.  No questions or complaints noted at this time. D/c ambulatory

## 2018-09-21 ENCOUNTER — Encounter: Payer: Self-pay | Admitting: Family Medicine

## 2018-09-21 NOTE — Assessment & Plan Note (Signed)
Annual exam as documented. Counseling done  re healthy lifestyle involving commitment to 150 minutes exercise per week, heart healthy diet, and attaining healthy weight.The importance of adequate sleep also discussed. Changes in health habits are decided on by the patient with goals and time frames  set for achieving them. Immunization and cancer screening needs are specifically addressed at this visit. 

## 2018-09-21 NOTE — Assessment & Plan Note (Signed)
Obesity associated with hypertension and arthritis Patient re-educated about  the importance of commitment to a  minimum of 150 minutes of exercise per week as able.  The importance of healthy food choices with portion control discussed, as well as eating regularly and within a 12 hour window most days. The need to choose "clean , green" food 50 to 75% of the time is discussed, as well as to make water the primary drink and set a goal of 64 ounces water daily.  Encouraged to start a food diary,  and to consider  joining a support group. Sample diet sheets offered. Goals set by the patient for the next several months.   Weight /BMI 09/17/2018 06/25/2018 06/20/2018  WEIGHT 200 lb 200 lb 14.4 oz 194 lb 14.4 oz  HEIGHT 5\' 2"  5\' 2"  5\' 2"   BMI 36.58 kg/m2 36.75 kg/m2 35.65 kg/m2

## 2018-09-30 ENCOUNTER — Other Ambulatory Visit: Payer: Self-pay | Admitting: Family Medicine

## 2018-10-20 ENCOUNTER — Encounter (HOSPITAL_COMMUNITY): Payer: Self-pay

## 2018-10-20 ENCOUNTER — Other Ambulatory Visit: Payer: Self-pay

## 2018-10-20 ENCOUNTER — Inpatient Hospital Stay (HOSPITAL_COMMUNITY): Payer: Federal, State, Local not specified - PPO | Attending: Hematology

## 2018-10-20 VITALS — BP 149/75 | HR 72 | Temp 97.0°F | Resp 18

## 2018-10-20 DIAGNOSIS — E538 Deficiency of other specified B group vitamins: Secondary | ICD-10-CM | POA: Diagnosis present

## 2018-10-20 MED ORDER — CYANOCOBALAMIN 1000 MCG/ML IJ SOLN
1000.0000 ug | Freq: Once | INTRAMUSCULAR | Status: AC
  Administered 2018-10-20: 11:00:00 1000 ug via INTRAMUSCULAR

## 2018-10-23 ENCOUNTER — Other Ambulatory Visit: Payer: Self-pay | Admitting: Family Medicine

## 2018-11-19 ENCOUNTER — Encounter (HOSPITAL_COMMUNITY): Payer: Self-pay

## 2018-11-19 ENCOUNTER — Other Ambulatory Visit: Payer: Self-pay

## 2018-11-19 ENCOUNTER — Inpatient Hospital Stay (HOSPITAL_COMMUNITY): Payer: Federal, State, Local not specified - PPO | Attending: Hematology

## 2018-11-19 VITALS — BP 120/70 | HR 82 | Temp 97.6°F | Resp 18

## 2018-11-19 DIAGNOSIS — E538 Deficiency of other specified B group vitamins: Secondary | ICD-10-CM | POA: Insufficient documentation

## 2018-11-19 MED ORDER — CYANOCOBALAMIN 1000 MCG/ML IJ SOLN
1000.0000 ug | Freq: Once | INTRAMUSCULAR | Status: AC
  Administered 2018-11-19: 1000 ug via INTRAMUSCULAR
  Filled 2018-11-19: qty 1

## 2018-11-19 NOTE — Progress Notes (Signed)
Patient tolerated injection with no complaints voiced.  Site clean and dry with no bruising or swelling noted at site.  Band aid applied.  Vss with discharge and left ambulatory with no s/s of distress noted.  

## 2018-11-21 ENCOUNTER — Other Ambulatory Visit: Payer: Self-pay | Admitting: Family Medicine

## 2018-11-24 ENCOUNTER — Other Ambulatory Visit: Payer: Self-pay | Admitting: Orthopedic Surgery

## 2018-11-24 DIAGNOSIS — Z96651 Presence of right artificial knee joint: Secondary | ICD-10-CM

## 2018-11-24 MED ORDER — MELOXICAM 15 MG PO TABS: 15.0000 mg | ORAL_TABLET | Freq: Every day | ORAL | 1 refills | Status: DC

## 2018-11-24 NOTE — Telephone Encounter (Signed)
Patient requests refill Meloxicam 15 mgs.  Qty  90       Sig: Take 1 tablet (15 mg total) by mouth daily.     She states she uses [facility] in [place], [place]

## 2018-12-01 ENCOUNTER — Other Ambulatory Visit: Payer: Self-pay | Admitting: Family Medicine

## 2018-12-19 ENCOUNTER — Inpatient Hospital Stay (HOSPITAL_COMMUNITY): Payer: Federal, State, Local not specified - PPO | Attending: Hematology

## 2018-12-19 ENCOUNTER — Encounter (HOSPITAL_COMMUNITY): Payer: Self-pay

## 2018-12-19 ENCOUNTER — Other Ambulatory Visit: Payer: Self-pay

## 2018-12-19 VITALS — BP 140/70 | HR 76 | Temp 98.2°F | Resp 18

## 2018-12-19 DIAGNOSIS — E538 Deficiency of other specified B group vitamins: Secondary | ICD-10-CM | POA: Insufficient documentation

## 2018-12-19 MED ORDER — CYANOCOBALAMIN 1000 MCG/ML IJ SOLN
1000.0000 ug | Freq: Once | INTRAMUSCULAR | Status: AC
  Administered 2018-12-19: 1000 ug via INTRAMUSCULAR

## 2018-12-19 NOTE — Patient Instructions (Signed)
[facility] at [facility] Discharge Instructions  Received Vit B12 injection today. Follow-up as scheduled. Call clinic for any questions or concerns   Thank you for choosing  [facility] at [facility] to provide your oncology and hematology care.  To afford each patient quality time with our provider, please arrive at least 15 minutes before your scheduled appointment time.   If you have a lab appointment with the Cancer Center please come in thru the Main Entrance and check in at the main information desk.  You need to re-schedule your appointment should you arrive 10 or more minutes late.  We strive to give you quality time with our providers, and arriving late affects you and other patients whose appointments are after yours.  Also, if you no show three or more times for appointments you may be dismissed from the clinic at the providers discretion.     Again, thank you for choosing [facility].  Our hope is that these requests will decrease the amount of time that you wait before being seen by our physicians.       _____________________________________________________________  Should you have questions after your visit to [facility], please contact our office at [phone] between the hours of 8:00 a.m. and 4:30 p.m.  Voicemails left after 4:00 p.m. will not be returned until the following business day.  For prescription refill requests, have your pharmacy contact our office and allow 72 hours.    Due to Covid, you will need to wear a mask upon entering the hospital. If you do not have a mask, a mask will be given to you at the Main Entrance upon arrival. For doctor visits, patients may have 1 support person with them. For treatment visits, patients can not have anyone with them due to social distancing guidelines and our immunocompromised population.     

## 2018-12-19 NOTE — Progress Notes (Signed)
Sheri Jones tolerated Vit B12 injection well without complaints or incident. VSS Pt discharged self ambulatory in satisfactory condition

## 2019-01-12 ENCOUNTER — Other Ambulatory Visit: Payer: Self-pay

## 2019-01-12 ENCOUNTER — Ambulatory Visit (HOSPITAL_COMMUNITY)
Admission: RE | Admit: 2019-01-12 | Discharge: 2019-01-12 | Disposition: A | Discharge status: Home / Self Care | Payer: Federal, State, Local not specified - PPO | Source: Ambulatory Visit | Attending: Family Medicine | Admitting: Family Medicine

## 2019-01-12 DIAGNOSIS — Z1231 Encounter for screening mammogram for malignant neoplasm of breast: Secondary | ICD-10-CM | POA: Diagnosis not present

## 2019-01-19 ENCOUNTER — Other Ambulatory Visit: Payer: Self-pay

## 2019-01-19 ENCOUNTER — Inpatient Hospital Stay (HOSPITAL_COMMUNITY): Payer: Federal, State, Local not specified - PPO | Attending: Hematology

## 2019-01-19 ENCOUNTER — Encounter (HOSPITAL_COMMUNITY): Payer: Self-pay

## 2019-01-19 VITALS — BP 119/62 | HR 69 | Temp 97.9°F | Resp 18

## 2019-01-19 DIAGNOSIS — E538 Deficiency of other specified B group vitamins: Secondary | ICD-10-CM | POA: Diagnosis not present

## 2019-01-19 MED ORDER — CYANOCOBALAMIN 1000 MCG/ML IJ SOLN
1000.0000 ug | Freq: Once | INTRAMUSCULAR | Status: AC
  Administered 2019-01-19: 11:00:00 1000 ug via INTRAMUSCULAR
  Filled 2019-01-19: qty 1

## 2019-01-19 NOTE — Progress Notes (Signed)
Patient tolerated injection with no complaints voiced.  Site clean and dry with no bruising or swelling noted at site.  Band aid applied.  VSS with discharge and left ambulatory with no s/s of distress noted.    

## 2019-01-19 NOTE — Patient Instructions (Signed)
[facility]  Discharge Instructions:   _______________________________________________________________  Thank you for choosing [facility] at [facility] to provide your oncology and hematology care.  To afford each patient quality time with our providers, please arrive at least 15 minutes before your scheduled appointment.  You need to re-schedule your appointment if you arrive 10 or more minutes late.  We strive to give you quality time with our providers, and arriving late affects you and other patients whose appointments are after yours.  Also, if you no show three or more times for appointments you may be dismissed from the clinic.  Again, thank you for choosing [facility] at [facility]. Our hope is that these requests will allow you access to exceptional care and in a timely manner. _______________________________________________________________  If you have questions after your visit, please contact our office at [phone] between the hours of 8:30 a.m. and 5:00 p.m. Voicemails left after 4:30 p.m. will not be returned until the following business day. _______________________________________________________________  For prescription refill requests, have your pharmacy contact our office. _______________________________________________________________  Recommendations made by the consultant and any test results will be sent to your referring physician. _______________________________________________________________ 

## 2019-02-13 ENCOUNTER — Encounter: Payer: Self-pay | Admitting: Family Medicine

## 2019-02-13 LAB — BASIC METABOLIC PANEL WITH GFR
BUN: 24 mg/dL (ref 7–25)
CO2: 29 mmol/L (ref 20–32)
Calcium: 9.1 mg/dL (ref 8.6–10.4)
Chloride: 108 mmol/L (ref 98–110)
Creat: 0.88 mg/dL (ref 0.60–0.93)
GFR, Est African American: 77 mL/min/{1.73_m2} (ref >=60)
GFR, Est Non African American: 66 mL/min/{1.73_m2} (ref >=60)
Glucose, Bld: 90 mg/dL (ref 65–99)
Potassium: 3.8 mmol/L (ref 3.5–5.3)
Sodium: 145 mmol/L (ref 135–146)

## 2019-02-13 LAB — LIPID PANEL
Cholesterol: 188 mg/dL (ref <200)
HDL: 73 mg/dL (ref >=50)
LDL Cholesterol (Calc): 100 mg/dL (calc) — ABNORMAL HIGH
Non-HDL Cholesterol (Calc): 115 mg/dL (calc) (ref <130)
Total CHOL/HDL Ratio: 2.6 (calc) (ref <5.0)
Triglycerides: 63 mg/dL (ref <150)

## 2019-02-13 LAB — HEMOGLOBIN A1C
Hgb A1c MFr Bld: 5.3 % of total Hgb (ref <5.7)
Mean Plasma Glucose: 105 (calc)
eAG (mmol/L): 5.8 (calc)

## 2019-02-13 LAB — VITAMIN D 25 HYDROXY (VIT D DEFICIENCY, FRACTURES): Vit D, 25-Hydroxy: 28 ng/mL — ABNORMAL LOW (ref 30–100)

## 2019-02-13 LAB — TSH: TSH: 1.12 mIU/L (ref 0.40–4.50)

## 2019-02-16 ENCOUNTER — Other Ambulatory Visit: Payer: Self-pay

## 2019-02-16 ENCOUNTER — Ambulatory Visit (INDEPENDENT_AMBULATORY_CARE_PROVIDER_SITE_OTHER): Payer: Federal, State, Local not specified - PPO | Admitting: Otolaryngology

## 2019-02-16 DIAGNOSIS — H6123 Impacted cerumen, bilateral: Secondary | ICD-10-CM

## 2019-02-17 ENCOUNTER — Ambulatory Visit (INDEPENDENT_AMBULATORY_CARE_PROVIDER_SITE_OTHER): Payer: Federal, State, Local not specified - PPO | Admitting: Family Medicine

## 2019-02-17 ENCOUNTER — Other Ambulatory Visit: Payer: Self-pay

## 2019-02-17 VITALS — BP 118/82 | HR 73 | Temp 98.0°F | Resp 15 | Ht 63.0 in | Wt 205.4 lb

## 2019-02-17 DIAGNOSIS — M25562 Pain in left knee: Secondary | ICD-10-CM | POA: Diagnosis not present

## 2019-02-17 DIAGNOSIS — I10 Essential (primary) hypertension: Secondary | ICD-10-CM | POA: Diagnosis not present

## 2019-02-17 DIAGNOSIS — Z23 Encounter for immunization: Secondary | ICD-10-CM

## 2019-02-17 DIAGNOSIS — M25561 Pain in right knee: Secondary | ICD-10-CM | POA: Diagnosis not present

## 2019-02-17 DIAGNOSIS — G8929 Other chronic pain: Secondary | ICD-10-CM | POA: Diagnosis not present

## 2019-02-17 DIAGNOSIS — E559 Vitamin D deficiency, unspecified: Secondary | ICD-10-CM

## 2019-02-17 MED ORDER — PREDNISONE 10 MG PO TABS: 10.0000 mg | ORAL_TABLET | Freq: Two times a day (BID) | ORAL | 0 refills | Status: AC

## 2019-02-17 MED ORDER — KETOROLAC TROMETHAMINE 60 MG/2ML IM SOLN
60.0000 mg | Freq: Once | INTRAMUSCULAR | Status: AC
  Administered 2019-02-17: 60 mg via INTRAMUSCULAR

## 2019-02-17 MED ORDER — METHYLPREDNISOLONE ACETATE 80 MG/ML IJ SUSP
80.0000 mg | Freq: Once | INTRAMUSCULAR | Status: AC
  Administered 2019-02-17: 80 mg via INTRAMUSCULAR

## 2019-02-17 NOTE — Progress Notes (Signed)
.  ht

## 2019-02-17 NOTE — Patient Instructions (Signed)
F/U I office with MD in 6 months,, call if you need me before   Flu vaccine today  Toradol 60 mg and Depo Medrol 80 mg IM in office today, and take prednisone for 5 days for knee pain  Labs are excellent, keep up the good work  Need to start additronl Vit D 3 1000IU once daily, continue Calcium and D as you are currently taking them  No 3 mile walks now, need knee replacement, discuss with Dr Aline Brochure  Thanks for choosing [facility], we consider it a privelige to serve you.

## 2019-02-17 NOTE — Progress Notes (Signed)
Sheri Jones     MRN: [id number]      DOB: [birth year]   HPI Sheri Jones is here for follow up and re-evaluation of chronic medical conditions, medication management and review of any available recent lab and radiology data.  Preventive health is updated, specifically  Cancer screening and Immunization.   Questions or concerns regarding consultations or procedures which the PT has had in the interim are  addressed. The PT denies any adverse reactions to current medications since the last visit.   2 week h/o left knee pain rated at a `10  ROS Denies recent fever or chills. Denies sinus pressure, nasal congestion, ear pain or sore throat. Denies chest congestion, productive cough or wheezing. Denies chest pains, palpitations and leg swelling Denies abdominal pain, nausea, vomiting,diarrhea or constipation.   Denies dysuria, frequency, hesitancy or incontinence.  Denies headaches, seizures, numbness, or tingling. Denies depression, anxiety or insomnia. Denies skin break down or rash.   PE  BP 118/82   Pulse 73   Temp 98 F (36.7 C) (Temporal)   Resp 15   Ht 5\' 3"  (1.6 m)   Wt 205 lb 6.4 oz (93.2 kg)   SpO2 97%   BMI 36.38 kg/m   Patient alert and oriented and in no cardiopulmonary distress.Pt in pain HEENT: No facial asymmetry, EOMI,     Neck supple .  Chest: Clear to auscultation bilaterally.  CVS: S1, S2 no murmurs, no S3.Regular rate.  ABD: Soft non tender.   Ext: No edema  MS: Adequate though reduced ROM spine, adequate in shoulders, hips and markedly reduced in right knee, which is swollen and has crepitus.also reduced in left knee  Skin: Intact, no ulcerations or rash noted.  Psych: Good eye contact, normal affect. Memory intact not anxious or depressed appearing.  CNS: CN 2-12 intact, power,  normal throughout.no focal deficits noted.   Assessment & Plan  KNEE, ARTHRITIS, DEGEN./OSTEO opaiu  Chronic pain of both knees Uncontrolled.Toradol and depo  medrol administered IM in the office , to be followed by a short course of oral prednisone . Needs to discuss and move forward with recommended knee replacement   Essential hypertension Controlled, no change in medication DASH diet and commitment to daily physical activity for a minimum of 30 minutes discussed and encouraged, as a part of hypertension management. The importance of attaining a healthy weight is also discussed.  BP/Weight 02/18/2019 02/17/2019 01/19/2019 12/19/2018 11/19/2018 10/20/2018 123456  Systolic BP Q000111Q 123456 [id number] [id number]  Diastolic BP 88 82 62 70 70 75 78  Wt. (Lbs) - 205.4 - - - - -  BMI - 36.38 - - - - -       Morbid obesity ([place]) Obesity linked with hypertension and osteoarthirtis  Patient re-educated about  the importance of commitment to a  minimum of 150 minutes of exercise per week as able.  The importance of healthy food choices with portion control discussed, as well as eating regularly and within a 12 hour window most days. The need to choose "clean , green" food 50 to 75% of the time is discussed, as well as to make water the primary drink and set a goal of 64 ounces water daily.    Weight /BMI 02/17/2019 09/17/2018 06/25/2018  WEIGHT 205 lb 6.4 oz 200 lb 200 lb 14.4 oz  HEIGHT 5\' 3"  5\' 2"  5\' 2"   BMI 36.38 kg/m2 36.58 kg/m2 36.75 kg/m2      Vitamin D deficiency  Needs additional Vit d 3 supplement daily, under corrected currently

## 2019-02-18 ENCOUNTER — Encounter (HOSPITAL_COMMUNITY): Payer: Self-pay

## 2019-02-18 ENCOUNTER — Inpatient Hospital Stay (HOSPITAL_COMMUNITY): Payer: Federal, State, Local not specified - PPO | Attending: Hematology

## 2019-02-18 VITALS — BP 147/88 | HR 80 | Temp 97.6°F | Resp 16

## 2019-02-18 DIAGNOSIS — E538 Deficiency of other specified B group vitamins: Secondary | ICD-10-CM | POA: Insufficient documentation

## 2019-02-18 MED ORDER — CYANOCOBALAMIN 1000 MCG/ML IJ SOLN
1000.0000 ug | Freq: Once | INTRAMUSCULAR | Status: AC
  Administered 2019-02-18: 1000 ug via INTRAMUSCULAR
  Filled 2019-02-18: qty 1

## 2019-02-18 NOTE — Progress Notes (Signed)
Patient tolerated injection with no complaints voiced.  Site clean and dry with no bruising or swelling noted at site.  Band aid applied.  Vss with discharge and left ambulatory with no s/s of distress noted.  

## 2019-02-21 ENCOUNTER — Other Ambulatory Visit: Payer: Self-pay | Admitting: Family Medicine

## 2019-02-22 ENCOUNTER — Encounter: Payer: Self-pay | Admitting: Family Medicine

## 2019-02-22 NOTE — Assessment & Plan Note (Signed)
opaiu

## 2019-02-22 NOTE — Assessment & Plan Note (Signed)
Needs additional Vit d 3 supplement daily, under corrected currently

## 2019-02-22 NOTE — Assessment & Plan Note (Signed)
Obesity linked with hypertension and osteoarthirtis  Patient re-educated about  the importance of commitment to a  minimum of 150 minutes of exercise per week as able.  The importance of healthy food choices with portion control discussed, as well as eating regularly and within a 12 hour window most days. The need to choose "clean , green" food 50 to 75% of the time is discussed, as well as to make water the primary drink and set a goal of 64 ounces water daily.    Weight /BMI 02/17/2019 09/17/2018 06/25/2018  WEIGHT 205 lb 6.4 oz 200 lb 200 lb 14.4 oz  HEIGHT 5\' 3"  5\' 2"  5\' 2"   BMI 36.38 kg/m2 36.58 kg/m2 36.75 kg/m2

## 2019-02-22 NOTE — Assessment & Plan Note (Signed)
Controlled, no change in medication DASH diet and commitment to daily physical activity for a minimum of 30 minutes discussed and encouraged, as a part of hypertension management. The importance of attaining a healthy weight is also discussed.  BP/Weight 02/18/2019 02/17/2019 01/19/2019 12/19/2018 11/19/2018 10/20/2018 123456  Systolic BP Q000111Q 123456 123456 [id number] 123456 [id number] 0000000  Diastolic BP 88 82 62 70 70 75 78  Wt. (Lbs) - 205.4 - - - - -  BMI - 36.38 - - - - -

## 2019-02-22 NOTE — Assessment & Plan Note (Signed)
Uncontrolled.Toradol and depo medrol administered IM in the office , to be followed by a short course of oral prednisone . Needs to discuss and move forward with recommended knee replacement

## 2019-03-02 ENCOUNTER — Ambulatory Visit: Payer: Federal, State, Local not specified - PPO

## 2019-03-02 ENCOUNTER — Encounter: Payer: Self-pay | Admitting: Orthopedic Surgery

## 2019-03-02 ENCOUNTER — Other Ambulatory Visit: Payer: Self-pay

## 2019-03-02 ENCOUNTER — Ambulatory Visit: Payer: Federal, State, Local not specified - PPO | Admitting: Orthopedic Surgery

## 2019-03-02 VITALS — BP 141/78 | HR 72 | Ht 63.0 in | Wt 205.0 lb

## 2019-03-02 DIAGNOSIS — M25562 Pain in left knee: Secondary | ICD-10-CM

## 2019-03-02 DIAGNOSIS — G8929 Other chronic pain: Secondary | ICD-10-CM

## 2019-03-02 NOTE — Patient Instructions (Signed)
You have decided to proceed with knee replacement surgery. You have decided not to continue with nonoperative measures such as but not limited to oral medication, weight loss, activity modification, physical therapy, bracing, or injection.  We will perform the procedure commonly known as total knee replacement. Some of the risks associated with knee replacement surgery include but are not limited to Bleeding Infection Swelling Stiffness Blood clot Pulmonary embolism  Loosening of the implant Pain that persists even after surgery  Infection is especially devastating complication of knee surgery although rare. If infection does occur your implant will usually have to be removed and several surgeries and antibiotics will be needed to eradicate the infection prior to performing a repeat replacement.   In some cases amputation is required to eradicate the infection. In other rare cases a knee fusion is needed   In compliance with recent [place] law in federal regulation regarding opioid use and abuse and addiction, we will taper (stop) opioid medication after 2 weeks.  If you're not comfortable with these risks and would like to continue with nonoperative treatment please let Dr. Harrison know prior to your surgery.  

## 2019-03-02 NOTE — Progress Notes (Signed)
Chief Complaint  Patient presents with  . Knee Pain    left knee pain  wants to discuss left knee replacement     73 year old female status post right total knee presents for possible left total knee complains of 8 out of 10 pain lateral joint line with no improvement and worsening pain over the last few months.  She describes a typical dull throbbing pain in the lateral joint line which interferes with her activities of daily living associated with stiffness and loss of motion   Past Medical History:  Diagnosis Date  . Allergic rhinitis due to pollen   . Anemia   . Diabetes mellitus without complication ([place])   . 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 Obesity, unspecified   . Osteoarthrosis, unspecified whether generalized or localized, lower leg   . Unspecified essential hypertension   . Vitamin B12 deficiency disease 10/28/2008   Qualifier: Diagnosis of  By: Moshe Cipro MD, Margaret      Review of Systems  Respiratory: Negative for shortness of breath.   Cardiovascular: Negative for chest pain.      Current Outpatient Medications:  .  aspirin 81 MG tablet, Take 81 mg by mouth daily., Disp: , Rfl:  .  Calcium-Vitamin D 500-125 MG-UNIT TABS, Take 1 tablet by mouth daily. , Disp: , Rfl:  .  cetirizine (ZYRTEC) 10 MG tablet, Take 10 mg by mouth daily. Reported on 10/24/2015, Disp: , Rfl:  .  cyanocobalamin (,VITAMIN B-12,) 1000 MCG/ML injection, Inject 1,000 mcg into the muscle every 30 (thirty) days., Disp: , Rfl:  .  hydrochlorothiazide (HYDRODIURIL) 25 MG tablet, TAKE 1 TABLET BY MOUTH EVERY DAY, Disp: 90 tablet, Rfl: 1 .  meclizine (ANTIVERT) 25 MG tablet, Take 1 tablet (25 mg total) by mouth 3 (three) times daily as needed for dizziness., Disp: 30 tablet, Rfl: 1 .  Multiple Vitamins-Minerals (CENTRUM ULTRA WOMENS PO), Take 1 tablet by mouth daily.  , Disp: , Rfl:  .  pantoprazole (PROTONIX) 20 MG tablet, TAKE 1 TABLET BY MOUTH EVERY DAY, Disp: 30 tablet, Rfl: 1 .  potassium  chloride (KLOR-CON) 10 MEQ tablet, TAKE 1 TABLET BY MOUTH TWICE A DAY, Disp: 180 tablet, Rfl: 1 .  diphenhydrAMINE (BENADRYL) 25 mg capsule, Take 25 mg by mouth every 6 (six) hours as needed., Disp: , Rfl:   BP (!) 141/78   Pulse 72   Ht 5\' 3"  (1.6 m)   Wt 205 lb (93 kg)   BMI 36.31 kg/m   Physical Exam Vitals signs and nursing note reviewed.  Constitutional:      Appearance: Normal appearance.  Neurological:     Mental Status: She is alert and oriented to person, place, and time.  Psychiatric:        Mood and Affect: Mood normal.    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  . Chronic pain of left knee Yes     Diagnostics   Plan: Jazae has indicated she is ready to proceed with left total knee  We will go ahead and make plans for that  The procedure has been fully reviewed with the  patient; The risks and benefits of surgery have been discussed and explained and understood. Alternative treatment has also been reviewed, questions were encouraged and answered. The postoperative plan is also been reviewed.  Left TKA

## 2019-03-12 NOTE — Patient Instructions (Signed)
Sheri Jones  03/12/2019     @PREFPERIOPPHARMACY @   Your procedure is scheduled on  03/24/2019   Report to [facility] at  [facility]  Call this number if you have problems the morning of surgery:  [phone]   Remember:  Do not eat or drink after midnight.                         Take these medicines the morning of surgery with A SIP OF WATER  Zyrtec, antivert(if needed), protonix.    Do not wear jewelry, make-up or nail polish.  Do not wear lotions, powders, or perfumes. Please wear deodorant and brush your teeth.  Do not shave 48 hours prior to surgery.  Men may shave face and neck.  Do not bring valuables to the hospital.  [facility] is not responsible for any belongings or valuables.  Contacts, dentures or bridgework may not be worn into surgery.  Leave your suitcase in the car.  After surgery it may be brought to your room.  For patients admitted to the hospital, discharge time will be determined by your treatment team.  Patients discharged the day of surgery will not be allowed to drive home.   Name and phone number of your driver:   family Special instructions:  None  Please read over the following fact sheets that you were given. Pain Booklet, Coughing and Deep Breathing, Blood Transfusion Information, Total Joint Packet, MRSA Information, Surgical Site Infection Prevention, Anesthesia Post-op Instructions and Care and Recovery After Surgery       Total Knee Replacement, Care After This sheet gives you information about how to care for yourself after your procedure. Your health care provider may also give you more specific instructions. If you have problems or questions, contact your health care provider. What can I expect after the procedure? After the procedure, it is common to have:  Pain.  Swelling.  A small amount of blood or clear fluid coming from your incision.  Limited range of motion. Follow these instructions at  home: Medicines  Take over-the-counter and prescription medicines only as told by your health care provider.  If you were prescribed a blood thinner (anticoagulant), take it as told by your health care provider.  Ask your health care provider if the medicine prescribed to you: ? Requires you to avoid driving or using heavy machinery. ? Can cause constipation. You may need to take actions to prevent or treat constipation, such as:  Drink enough fluid to keep your urine pale yellow.  Take over-the-counter or prescription medicines.  Eat foods that are high in fiber, such as beans, whole grains, and fresh fruits and vegetables.  Limit foods that are high in fat and processed sugars, such as fried or sweet foods. Bathing  Do not take baths, swim, or use a hot tub until your health care provider approves. Ask your health care provider if you may take showers. You may only be allowed to take sponge baths.  Keep your bandage (dressing) dry until your health care provider says it can be removed. Incision care and drain care   Follow instructions from your health care provider about how to take care of your incision. Make sure you: ? Wash your hands with soap and water before and after you change your dressing. If soap and water are not available, use hand sanitizer. ? Change your dressing  as told by your health care provider. ? Leave stitches (sutures), skin glue, or adhesive strips in place. These skin closures may need to stay in place for 2 weeks or longer. If adhesive strip edges start to loosen and curl up, you may trim the loose edges. Do not remove adhesive strips completely unless your health care provider tells you to do that.  Check your incision area and drain site every day for signs of infection. Check for: ? More redness, swelling, or pain. ? More fluid or blood. ? Warmth. ? Pus or a bad smell.  If you have a drain, follow instructions from your health care provider about  caring for it. Managing pain, stiffness, and swelling      If directed, put ice on your knee. ? Put ice in a plastic bag or use the icing device (cold flow pad or cryocuff) that you were given. Follow instructions from your health care provider about how to use the icing device. ? Place a towel between your skin and the bag or between your skin and the icing device. ? Leave the ice on for 20 minutes, 2-3 times per day.  If directed, apply heat to the affected area before you exercise. Use the heat source that your health care provider recommends, such as a moist heat pack or a heating pad. ? Place a towel between your skin and the heat source. ? Leave the heat on for 20-30 minutes. ? Remove the heat if your skin turns bright red. This is especially important if you are unable to feel pain, heat, or cold. You may have a greater risk of getting burned.  Move your toes often to avoid stiffness and to lessen swelling.  Raise (elevate) your leg above the level of your heart while you are sitting or lying down. ? Use several pillows to keep your leg straight. ? Do not put a pillow just under the knee. If the knee is bent for a long time, this may lead to stiffness.  Wear elastic knee support as told by your health care provider. Activity  Rest as told by your health care provider.  Avoid sitting for a long time without moving. Get up to take short walks every 1-2 hours. This is important to improve blood flow and breathing. Ask for help if you feel weak or unsteady.  Ask your health care provider what activities are safe for you.  Avoid high-impact activities, including running, jumping rope, and jumping jacks.  Do not play contact sports until your health care provider approves.  Do exercises as told by your physical therapist.  If you have been sent home with a continuous passive motion machine, use it as told by your health care provider. Safety   Do not use your leg to support  your body weight until your health care provider approves. Use crutches or a walker as told by your health care provider.  Do not drive until your health care provider approves. Ask your health care provider when it is safe to drive. General instructions  Do not use any products that contain nicotine or tobacco, such as cigarettes, e-cigarettes, and chewing tobacco. These can delay healing after surgery. If you need help quitting, ask your health care provider.  Wear compression stockings as told by your health care provider.  Tell your health care provider if you plan to have dental work. Also, tell your dentist about your joint replacement.  Keep all follow-up visits as told by your  health care provider. This is important. Contact a health care provider if you have:  More redness, swelling, or pain around your incision or drain.  More fluid or blood coming from your incision or drain.  Pus or a bad smell coming from your incision or drain.  Warmth on your incision or drain site.  A fever.  An incision that breaks open.  Knee pain that does not go away.  Range of motion in your knee that is getting worse.  A prosthesis that feels loose. Get help right away if you have:  Pain or swelling in your calf or thigh.  Shortness of breath or difficulty breathing.  Chest pain. Summary  After the procedure, it is common to have pain and swelling, blood or fluid coming from your incision, and limited range of motion.  Follow instructions from your health care provider about how to take care of your incision.  Use crutches or a walker as told by your health care provider.  If you were prescribed a blood thinner (anticoagulant), take it as told by your health care provider.  Keep all follow-up visits as told by your health care provider. This is important. This information is not intended to replace advice given to you by your health care provider. Make sure you discuss any  questions you have with your health care provider. Document Released: 10/27/2004 Document Revised: 05/02/2018 Document Reviewed: 11/21/2017 Elsevier Interactive Patient Education  [place].  Spinal Anesthesia and Epidural Anesthesia, Care After This sheet gives you information about how to care for yourself after your procedure. Your doctor may also give you more specific instructions. If you have problems or questions, call your doctor. Follow these instructions at home: For at least 24 hours after the procedure:   Have a responsible adult stay with you. It is important to have someone help care for you until you are awake and alert.  Rest as needed.  Do not do activities where you could fall or get hurt (injured).  Do not drive.  Do not use heavy machinery.  Do not drink alcohol.  Do not take sleeping pills or medicines that make you sleepy (drowsy).  Do not make important decisions.  Do not sign legal documents.  Do not take care of children on your own. Eating and drinking  If you throw up (vomit), drink water, juice, or soup when nausea and vomiting stop.  Drink enough fluid to keep your pee (urine) pale yellow.  Make sure you do not feel like throwing up (nauseous) before you eat solid foods.  Follow the diet that your doctor recommends. General instructions  Return to your normal activities as told by your doctor. Ask your doctor what activities are safe for you.  Take over-the-counter and prescription medicines only as told by your doctor.  If you have sleep apnea, surgery and certain medicines can raise your risk for breathing problems. Follow instructions from your doctor about when to wear your sleep device. Your doctor may tell you to wear your sleep device: ? Anytime you are sleeping, including during daytime naps. ? While taking prescription pain medicines, sleeping pills, or medicines that make you sleepy.  Do not use any products that contain  nicotine or tobacco. This includes cigarettes and e-cigarettes. ? If you need help quitting, ask your doctor. ? If you smoke, do not smoke by yourself. Make sure someone is nearby in case you need help.  Keep all follow-up visits as told by your doctor.  This is important. Contact a doctor if:  It has been more than one day since your procedure and you feel like throwing up.  It has been more than one day since your procedure and you throw up.  You have a rash. Get help right away if:  You have a fever.  You have a headache that lasts a long time.  You have a very bad headache.  Your vision is blurry.  You see two of a single object (double vision).  You are dizzy or light-headed.  You faint.  Your arms or legs tingle, feel weak, or get numb.  You have trouble breathing.  You cannot pee (urinate). Summary  After the procedure, have a responsible adult stay with you at home until you are fully awake and alert.  Do not do activities that might get you injured. Do not drive, use heavy machinery, drink alcohol, or make important decisions for 24 hours after the procedure.  Take medicines as told by your doctor. Do not use products that contain nicotine or tobacco.  Get help right away if you have a fever, blurry vision, difficulty breathing or passing urine, or weakness or numbness in arms or legs. This information is not intended to replace advice given to you by your health care provider. Make sure you discuss any questions you have with your health care provider. Document Released: 08/01/2015 Document Revised: 03/22/2017 Document Reviewed: 08/01/2015 Elsevier Patient Education  2020 [place] Anesthesia, Adult, Care After This sheet gives you information about how to care for yourself after your procedure. Your health care provider may also give you more specific instructions. If you have problems or questions, contact your health care provider. What can I  expect after the procedure? After the procedure, the following side effects are common:  Pain or discomfort at the IV site.  Nausea.  Vomiting.  Sore throat.  Trouble concentrating.  Feeling cold or chills.  Weak or tired.  Sleepiness and fatigue.  Soreness and body aches. These side effects can affect parts of the body that were not involved in surgery. Follow these instructions at home:  For at least 24 hours after the procedure:  Have a responsible adult stay with you. It is important to have someone help care for you until you are awake and alert.  Rest as needed.  Do not: ? Participate in activities in which you could fall or become injured. ? Drive. ? Use heavy machinery. ? Drink alcohol. ? Take sleeping pills or medicines that cause drowsiness. ? Make important decisions or sign legal documents. ? Take care of children on your own. Eating and drinking  Follow any instructions from your health care provider about eating or drinking restrictions.  When you feel hungry, start by eating small amounts of foods that are soft and easy to digest (bland), such as toast. Gradually return to your regular diet.  Drink enough fluid to keep your urine pale yellow.  If you vomit, rehydrate by drinking water, juice, or clear broth. General instructions  If you have sleep apnea, surgery and certain medicines can increase your risk for breathing problems. Follow instructions from your health care provider about wearing your sleep device: ? Anytime you are sleeping, including during daytime naps. ? While taking prescription pain medicines, sleeping medicines, or medicines that make you drowsy.  Return to your normal activities as told by your health care provider. Ask your health care provider what activities are safe for you.  Take over-the-counter and prescription medicines only as told by your health care provider.  If you smoke, do not smoke without supervision.  Keep  all follow-up visits as told by your health care provider. This is important. Contact a health care provider if:  You have nausea or vomiting that does not get better with medicine.  You cannot eat or drink without vomiting.  You have pain that does not get better with medicine.  You are unable to pass urine.  You develop a skin rash.  You have a fever.  You have redness around your IV site that gets worse. Get help right away if:  You have difficulty breathing.  You have chest pain.  You have blood in your urine or stool, or you vomit blood. Summary  After the procedure, it is common to have a sore throat or nausea. It is also common to feel tired.  Have a responsible adult stay with you for the first 24 hours after general anesthesia. It is important to have someone help care for you until you are awake and alert.  When you feel hungry, start by eating small amounts of foods that are soft and easy to digest (bland), such as toast. Gradually return to your regular diet.  Drink enough fluid to keep your urine pale yellow.  Return to your normal activities as told by your health care provider. Ask your health care provider what activities are safe for you. This information is not intended to replace advice given to you by your health care provider. Make sure you discuss any questions you have with your health care provider. Document Released: 07/16/2000 Document Revised: 04/12/2017 Document Reviewed: 11/23/2016 Elsevier Patient Education  2020 [employer]. How to Use Chlorhexidine for Bathing Chlorhexidine gluconate (CHG) is a germ-killing (antiseptic) solution that is used to clean the skin. It can get rid of the bacteria that normally live on the skin and can keep them away for about 24 hours. To clean your skin with CHG, you may be given:  A CHG solution to use in the shower or as part of a sponge bath.  A prepackaged cloth that contains CHG. Cleaning your skin with CHG  may help lower the risk for infection:  While you are staying in the intensive care unit of the hospital.  If you have a vascular access, such as a central line, to provide short-term or long-term access to your veins.  If you have a catheter to drain urine from your bladder.  If you are on a ventilator. A ventilator is a machine that helps you breathe by moving air in and out of your lungs.  After surgery. What are the risks? Risks of using CHG include:  A skin reaction.  Hearing loss, if CHG gets in your ears.  Eye injury, if CHG gets in your eyes and is not rinsed out.  The CHG product catching fire. Make sure that you avoid smoking and flames after applying CHG to your skin. Do not use CHG:  If you have a chlorhexidine allergy or have previously reacted to chlorhexidine.  On babies younger than 29 months of age. How to use CHG solution  Use CHG only as told by your health care provider, and follow the instructions on the label.  Use the full amount of CHG as directed. Usually, this is one bottle. During a shower Follow these steps when using CHG solution during a shower (unless your health care provider gives you different instructions): 1.  Start the shower. 2. Use your normal soap and shampoo to wash your face and hair. 3. Turn off the shower or move out of the shower stream. 4. Pour the CHG onto a clean washcloth. Do not use any type of brush or rough-edged sponge. 5. Starting at your neck, lather your body down to your toes. Make sure you follow these instructions: ? If you will be having surgery, pay special attention to the part of your body where you will be having surgery. Scrub this area for at least 1 minute. ? Do not use CHG on your head or face. If the solution gets into your ears or eyes, rinse them well with water. ? Avoid your genital area. ? Avoid any areas of skin that have broken skin, cuts, or scrapes. ? Scrub your back and under your arms. Make sure to  wash skin folds. 6. Let the lather sit on your skin for 1-2 minutes or as long as told by your health care provider. 7. Thoroughly rinse your entire body in the shower. Make sure that all body creases and crevices are rinsed well. 8. Dry off with a clean towel. Do not put any substances on your body afterward--such as powder, lotion, or perfume--unless you are told to do so by your health care provider. Only use lotions that are recommended by the manufacturer. 9. Put on clean clothes or pajamas. 10. If it is the night before your surgery, sleep in clean sheets.  During a sponge bath Follow these steps when using CHG solution during a sponge bath (unless your health care provider gives you different instructions): 1. Use your normal soap and shampoo to wash your face and hair. 2. Pour the CHG onto a clean washcloth. 3. Starting at your neck, lather your body down to your toes. Make sure you follow these instructions: ? If you will be having surgery, pay special attention to the part of your body where you will be having surgery. Scrub this area for at least 1 minute. ? Do not use CHG on your head or face. If the solution gets into your ears or eyes, rinse them well with water. ? Avoid your genital area. ? Avoid any areas of skin that have broken skin, cuts, or scrapes. ? Scrub your back and under your arms. Make sure to wash skin folds. 4. Let the lather sit on your skin for 1-2 minutes or as long as told by your health care provider. 5. Using a different clean, wet washcloth, thoroughly rinse your entire body. Make sure that all body creases and crevices are rinsed well. 6. Dry off with a clean towel. Do not put any substances on your body afterward--such as powder, lotion, or perfume--unless you are told to do so by your health care provider. Only use lotions that are recommended by the manufacturer. 7. Put on clean clothes or pajamas. 8. If it is the night before your surgery, sleep in clean  sheets. How to use CHG prepackaged cloths  Only use CHG cloths as told by your health care provider, and follow the instructions on the label.  Use the CHG cloth on clean, dry skin.  Do not use the CHG cloth on your head or face unless your health care provider tells you to.  When washing with the CHG cloth: ? Avoid your genital area. ? Avoid any areas of skin that have broken skin, cuts, or scrapes. Before surgery Follow these steps when using a CHG cloth to clean  before surgery (unless your health care provider gives you different instructions): 1. Using the CHG cloth, vigorously scrub the part of your body where you will be having surgery. Scrub using a back-and-forth motion for 3 minutes. The area on your body should be completely wet with CHG when you are done scrubbing. 2. Do not rinse. Discard the cloth and let the area air-dry. Do not put any substances on the area afterward, such as powder, lotion, or perfume. 3. Put on clean clothes or pajamas. 4. If it is the night before your surgery, sleep in clean sheets.  For general bathing Follow these steps when using CHG cloths for general bathing (unless your health care provider gives you different instructions). 1. Use a separate CHG cloth for each area of your body. Make sure you wash between any folds of skin and between your fingers and toes. Wash your body in the following order, switching to a new cloth after each step: ? The front of your neck, shoulders, and chest. ? Both of your arms, under your arms, and your hands. ? Your stomach and groin area, avoiding the genitals. ? Your right leg and foot. ? Your left leg and foot. ? The back of your neck, your back, and your buttocks. 2. Do not rinse. Discard the cloth and let the area air-dry. Do not put any substances on your body afterward--such as powder, lotion, or perfume--unless you are told to do so by your health care provider. Only use lotions that are recommended by the  manufacturer. 3. Put on clean clothes or pajamas. Contact a health care provider if:  Your skin gets irritated after scrubbing.  You have questions about using your solution or cloth. Get help right away if:  Your eyes become very red or swollen.  Your eyes itch badly.  Your skin itches badly and is red or swollen.  Your hearing changes.  You have trouble seeing.  You have swelling or tingling in your mouth or throat.  You have trouble breathing.  You swallow any chlorhexidine. Summary  Chlorhexidine gluconate (CHG) is a germ-killing (antiseptic) solution that is used to clean the skin. Cleaning your skin with CHG may help to lower your risk for infection.  You may be given CHG to use for bathing. It may be in a bottle or in a prepackaged cloth to use on your skin. Carefully follow your health care provider's instructions and the instructions on the product label.  Do not use CHG if you have a chlorhexidine allergy.  Contact your health care provider if your skin gets irritated after scrubbing. This information is not intended to replace advice given to you by your health care provider. Make sure you discuss any questions you have with your health care provider. Document Released: 01/02/2012 Document Revised: 06/26/2018 Document Reviewed: 03/07/2017 Elsevier Patient Education  2020 [employer].

## 2019-03-18 ENCOUNTER — Encounter (HOSPITAL_COMMUNITY)
Admission: RE | Admit: 2019-03-18 | Discharge: 2019-03-18 | Disposition: A | Discharge status: Home / Self Care | Payer: Federal, State, Local not specified - PPO | Source: Ambulatory Visit | Attending: Orthopedic Surgery | Admitting: Orthopedic Surgery

## 2019-03-18 ENCOUNTER — Telehealth: Payer: Self-pay | Admitting: Radiology

## 2019-03-18 ENCOUNTER — Other Ambulatory Visit: Payer: Self-pay | Admitting: Orthopedic Surgery

## 2019-03-18 ENCOUNTER — Encounter (HOSPITAL_COMMUNITY): Payer: Self-pay

## 2019-03-18 ENCOUNTER — Other Ambulatory Visit: Payer: Self-pay

## 2019-03-18 DIAGNOSIS — G8929 Other chronic pain: Secondary | ICD-10-CM

## 2019-03-18 DIAGNOSIS — M25562 Pain in left knee: Secondary | ICD-10-CM

## 2019-03-18 DIAGNOSIS — M1711 Unilateral primary osteoarthritis, right knee: Secondary | ICD-10-CM

## 2019-03-18 DIAGNOSIS — E876 Hypokalemia: Secondary | ICD-10-CM

## 2019-03-18 DIAGNOSIS — Z01812 Encounter for preprocedural laboratory examination: Secondary | ICD-10-CM | POA: Insufficient documentation

## 2019-03-18 LAB — CBC WITH DIFFERENTIAL/PLATELET
Abs Immature Granulocytes: 0.01 10*3/uL (ref 0.00–0.07)
Basophils Absolute: 0 10*3/uL (ref 0.0–0.1)
Basophils Relative: 0 %
Eosinophils Absolute: 0.2 10*3/uL (ref 0.0–0.5)
Eosinophils Relative: 4 %
HCT: 38.7 % (ref 36.0–46.0)
Hemoglobin: 13 g/dL (ref 12.0–15.0)
Immature Granulocytes: 0 %
Lymphocytes Relative: 46 %
Lymphs Abs: 2.2 10*3/uL (ref 0.7–4.0)
MCH: 31.6 pg (ref 26.0–34.0)
MCHC: 33.6 g/dL (ref 30.0–36.0)
MCV: 93.9 fL (ref 80.0–100.0)
Monocytes Absolute: 0.5 10*3/uL (ref 0.1–1.0)
Monocytes Relative: 10 %
Neutro Abs: 1.9 10*3/uL (ref 1.7–7.7)
Neutrophils Relative %: 40 %
Platelets: 179 10*3/uL (ref 150–400)
RBC: 4.12 MIL/uL (ref 3.87–5.11)
RDW: 15.1 % (ref 11.5–15.5)
WBC: 4.8 10*3/uL (ref 4.0–10.5)
nRBC: 0 % (ref 0.0–0.2)

## 2019-03-18 LAB — BASIC METABOLIC PANEL
Anion gap: 12 (ref 5–15)
BUN: 20 mg/dL (ref 8–23)
CO2: 25 mmol/L (ref 22–32)
Calcium: 9 mg/dL (ref 8.9–10.3)
Chloride: 106 mmol/L (ref 98–111)
Creatinine, Ser: 0.79 mg/dL (ref 0.44–1.00)
GFR calc Af Amer: >60 mL/min (ref >60)
GFR calc non Af Amer: >60 mL/min (ref >60)
Glucose, Bld: 91 mg/dL (ref 70–99)
Potassium: 3.1 mmol/L — ABNORMAL LOW (ref 3.5–5.1)
Sodium: 143 mmol/L (ref 135–145)

## 2019-03-18 LAB — TYPE AND SCREEN
ABO/RH(D): O POS
Antibody Screen: NEGATIVE

## 2019-03-18 LAB — PREPARE RBC (CROSSMATCH)

## 2019-03-18 MED ORDER — POTASSIUM CHLORIDE CRYS ER 20 MEQ PO TBCR: 20.0000 meq | EXTENDED_RELEASE_TABLET | Freq: Three times a day (TID) | ORAL | 0 refills | Status: DC

## 2019-03-18 NOTE — Telephone Encounter (Signed)
I called [employer] for authorization today and was put through to a voicemail  I have faxed information to them for authorization [phone] and will have to check on this on Monday since her surgery is Tuesday.

## 2019-03-18 NOTE — Telephone Encounter (Signed)
I called patient about the labs she had at hospital her K+ is low, she needs to pick up Rx start today.   She had me resend to [facility] since she is on her way there now.

## 2019-03-23 ENCOUNTER — Other Ambulatory Visit (HOSPITAL_COMMUNITY)
Admission: RE | Admit: 2019-03-23 | Discharge: 2019-03-23 | Disposition: A | Discharge status: Home / Self Care | Payer: Federal, State, Local not specified - PPO | Source: Ambulatory Visit | Attending: Orthopedic Surgery | Admitting: Orthopedic Surgery

## 2019-03-23 ENCOUNTER — Encounter (HOSPITAL_COMMUNITY): Payer: Self-pay

## 2019-03-23 ENCOUNTER — Other Ambulatory Visit: Payer: Self-pay

## 2019-03-23 ENCOUNTER — Inpatient Hospital Stay (HOSPITAL_COMMUNITY): Payer: Federal, State, Local not specified - PPO | Attending: Hematology

## 2019-03-23 VITALS — BP 132/75 | HR 82 | Temp 97.9°F | Resp 18

## 2019-03-23 DIAGNOSIS — Z01812 Encounter for preprocedural laboratory examination: Secondary | ICD-10-CM | POA: Diagnosis not present

## 2019-03-23 DIAGNOSIS — Z20828 Contact with and (suspected) exposure to other viral communicable diseases: Secondary | ICD-10-CM | POA: Diagnosis not present

## 2019-03-23 DIAGNOSIS — E538 Deficiency of other specified B group vitamins: Secondary | ICD-10-CM | POA: Diagnosis not present

## 2019-03-23 LAB — SARS CORONAVIRUS 2 (TAT 6-24 HRS): SARS Coronavirus 2: NEGATIVE

## 2019-03-23 MED ORDER — CYANOCOBALAMIN 1000 MCG/ML IJ SOLN
1000.0000 ug | Freq: Once | INTRAMUSCULAR | Status: AC
  Administered 2019-03-23: 1000 ug via INTRAMUSCULAR

## 2019-03-23 NOTE — Telephone Encounter (Signed)
auth not required, Medicare is primary, per their fax.

## 2019-03-23 NOTE — H&P (Signed)
Chief Complaint  Patient presents with  . Knee Pain      left knee pain  wants to discuss left knee replacement       73 year old female status post right total knee presents for possible left total knee complains of 8 out of 10 pain lateral joint line with no improvement and worsening pain over the last few months.  Sheri Jones describes a typical dull throbbing pain in the lateral joint line which interferes with her activities of daily living associated with stiffness and loss of motion     Family History  Problem Relation Age of Onset  . Hypertension Mother   . Hypertension Sister   . Hypertension Sister   . Stroke Sister 31  . Hypertension Sister   . Heart disease Father 63       massive heart attack   . Breast cancer Maternal Aunt   . Lung cancer Maternal Aunt    Social History   Tobacco Use  . Smoking status: Never Smoker  . Smokeless tobacco: Never Used  Substance Use Topics  . Alcohol use: No  . Drug use: No   Past Surgical History:  Procedure Laterality Date  . COLONOSCOPY  2008  . EXAM UNDER ANESTHESIA WITH MANIPULATION OF KNEE Right 05/11/2015   Procedure: EXAM UNDER ANESTHESIA WITH MANIPULATION OF KNEE;  Surgeon: Carole Civil, MD;  Location: [facility];  Service: Orthopedics;  Laterality: Right;  . EYE SURGERY Left 04/27/2018   cataract  . EYE SURGERY Right 06/17/2018   cataract  . GASTRIC BYPASS  2004  . TOTAL KNEE ARTHROPLASTY Right 02/23/2015   Procedure: RIGHT TOTAL KNEE ARTHROPLASTY;  Surgeon: Carole Civil, MD;  Location: [facility];  Service: Orthopedics;  Laterality: Right;        Past Medical History:  Diagnosis Date  . Allergic rhinitis due to pollen    . Anemia    . Diabetes mellitus without complication ([place])    . 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 Obesity, unspecified    . Osteoarthrosis, unspecified whether generalized or localized, lower leg    . Unspecified essential hypertension    . Vitamin B12 deficiency disease 10/28/2008     Qualifier: Diagnosis of  By: Moshe Cipro MD, Margaret        Review of Systems  Respiratory: Negative for shortness of breath.   Cardiovascular: Negative for chest pain.    All other systems reviewed neg        Current Outpatient Medications:  .  aspirin 81 MG tablet, Take 81 mg by mouth daily., Disp: , Rfl:  .  Calcium-Vitamin D 500-125 MG-UNIT TABS, Take 1 tablet by mouth daily. , Disp: , Rfl:  .  cetirizine (ZYRTEC) 10 MG tablet, Take 10 mg by mouth daily. Reported on 10/24/2015, Disp: , Rfl:  .  cyanocobalamin (,VITAMIN B-12,) 1000 MCG/ML injection, Inject 1,000 mcg into the muscle every 30 (thirty) days., Disp: , Rfl:  .  hydrochlorothiazide (HYDRODIURIL) 25 MG tablet, TAKE 1 TABLET BY MOUTH EVERY DAY, Disp: 90 tablet, Rfl: 1 .  meclizine (ANTIVERT) 25 MG tablet, Take 1 tablet (25 mg total) by mouth 3 (three) times daily as needed for dizziness., Disp: 30 tablet, Rfl: 1 .  Multiple Vitamins-Minerals (CENTRUM ULTRA WOMENS PO), Take 1 tablet by mouth daily.  , Disp: , Rfl:  .  pantoprazole (PROTONIX) 20 MG tablet, TAKE 1 TABLET BY MOUTH EVERY DAY, Disp: 30 tablet, Rfl: 1 .  potassium chloride (KLOR-CON) 10 MEQ tablet,  TAKE 1 TABLET BY MOUTH TWICE A DAY, Disp: 180 tablet, Rfl: 1 .  diphenhydrAMINE (BENADRYL) 25 mg capsule, Take 25 mg by mouth every 6 (six) hours as needed., Disp: , Rfl:    BP (!) 141/78   Pulse 72   Ht 5\' 3"  (1.6 m)   Wt 205 lb (93 kg)   BMI 36.31 kg/m    Physical Exam Vitals signs and nursing note reviewed.  Constitutional:      Appearance: Normal appearance.  Neurological:     Mental Status: Sheri Jones is alert and oriented to person, place, and time.  Psychiatric:        Mood and Affect: Mood normal.      Left knee skin looks normal The lateral compartment is tender The global range of motion is 90 degrees The joint is stable Motor exam is normal   Cardiovascular exam is normal with no edema  SENSATION normal LYMPH normal   MEDICAL DECISIONS   Data:     Rosita Fire show a 13 degree valgus deformity left knee with severe arthritis lateral compartment Summation of records-         Encounter Diagnosis  Name Primary?  . Chronic pain of left knee Yes        Diagnostics     Plan: Sheri Jones has indicated Sheri Jones is ready to proceed with left total knee   We will go ahead and make plans for that   The procedure has been fully reviewed with the patient; The risks and benefits of surgery have been discussed and explained and understood. Alternative treatment has also been reviewed, questions were encouraged and answered. The postoperative plan is also been reviewed.   Left TKA

## 2019-03-24 ENCOUNTER — Encounter (HOSPITAL_COMMUNITY): Payer: Self-pay | Admitting: Anesthesiology

## 2019-03-24 ENCOUNTER — Ambulatory Visit (HOSPITAL_COMMUNITY): Payer: Federal, State, Local not specified - PPO

## 2019-03-24 ENCOUNTER — Other Ambulatory Visit: Payer: Self-pay

## 2019-03-24 ENCOUNTER — Encounter (HOSPITAL_COMMUNITY)
Admission: RE | Disposition: A | Discharge status: Home / Self Care | Payer: Self-pay | Source: Home / Self Care | Attending: Orthopedic Surgery

## 2019-03-24 ENCOUNTER — Ambulatory Visit (HOSPITAL_COMMUNITY): Payer: Federal, State, Local not specified - PPO | Admitting: Anesthesiology

## 2019-03-24 ENCOUNTER — Observation Stay (HOSPITAL_COMMUNITY)
Admission: RE | Admit: 2019-03-24 | Discharge: 2019-03-25 | Disposition: A | Discharge status: Home / Self Care | Payer: Federal, State, Local not specified - PPO | Attending: Orthopedic Surgery | Admitting: Orthopedic Surgery

## 2019-03-24 DIAGNOSIS — E119 Type 2 diabetes mellitus without complications: Secondary | ICD-10-CM | POA: Diagnosis not present

## 2019-03-24 DIAGNOSIS — I10 Essential (primary) hypertension: Secondary | ICD-10-CM | POA: Diagnosis not present

## 2019-03-24 DIAGNOSIS — Z6836 Body mass index (BMI) 36.0-36.9, adult: Secondary | ICD-10-CM | POA: Insufficient documentation

## 2019-03-24 DIAGNOSIS — Z79899 Other long term (current) drug therapy: Secondary | ICD-10-CM | POA: Diagnosis not present

## 2019-03-24 DIAGNOSIS — E669 Obesity, unspecified: Secondary | ICD-10-CM | POA: Insufficient documentation

## 2019-03-24 DIAGNOSIS — Z7982 Long term (current) use of aspirin: Secondary | ICD-10-CM | POA: Insufficient documentation

## 2019-03-24 DIAGNOSIS — M1712 Unilateral primary osteoarthritis, left knee: Principal | ICD-10-CM

## 2019-03-24 DIAGNOSIS — Z9884 Bariatric surgery status: Secondary | ICD-10-CM | POA: Insufficient documentation

## 2019-03-24 DIAGNOSIS — Z8249 Family history of ischemic heart disease and other diseases of the circulatory system: Secondary | ICD-10-CM | POA: Insufficient documentation

## 2019-03-24 DIAGNOSIS — Z96652 Presence of left artificial knee joint: Secondary | ICD-10-CM

## 2019-03-24 DIAGNOSIS — Z96659 Presence of unspecified artificial knee joint: Secondary | ICD-10-CM

## 2019-03-24 DIAGNOSIS — D649 Anemia, unspecified: Secondary | ICD-10-CM | POA: Insufficient documentation

## 2019-03-24 DIAGNOSIS — G8929 Other chronic pain: Secondary | ICD-10-CM | POA: Diagnosis not present

## 2019-03-24 HISTORY — PX: TOTAL KNEE ARTHROPLASTY: SHX125

## 2019-03-24 LAB — GLUCOSE, CAPILLARY
Glucose-Capillary: 91 mg/dL (ref 70–99)
Glucose-Capillary: 85 mg/dL (ref 70–99)

## 2019-03-24 SURGERY — ARTHROPLASTY, KNEE, TOTAL
Anesthesia: General | Site: Knee | Laterality: Left

## 2019-03-24 MED ORDER — CEFAZOLIN SODIUM-DEXTROSE 2-4 GM/100ML-% IV SOLN
2.0000 g | Freq: Four times a day (QID) | INTRAVENOUS | Status: AC
  Administered 2019-03-24 (×2): 2 g via INTRAVENOUS
  Filled 2019-03-24 (×2): qty 100

## 2019-03-24 MED ORDER — LACTATED RINGERS IV SOLN
INTRAVENOUS | Status: DC | PRN
  Administered 2019-03-24 (×2): via INTRAVENOUS

## 2019-03-24 MED ORDER — TRAMADOL HCL 50 MG PO TABS
50.0000 mg | ORAL_TABLET | Freq: Four times a day (QID) | ORAL | Status: DC
  Administered 2019-03-24 – 2019-03-25 (×5): 50 mg via ORAL
  Filled 2019-03-24 (×5): qty 1

## 2019-03-24 MED ORDER — FENTANYL CITRATE (PF) 100 MCG/2ML IJ SOLN
INTRAMUSCULAR | Status: DC | PRN
  Administered 2019-03-24 (×5): 25 ug via INTRAVENOUS

## 2019-03-24 MED ORDER — BISACODYL 5 MG PO TBEC: 5.0000 mg | DELAYED_RELEASE_TABLET | Freq: Every day | ORAL | Status: DC | PRN

## 2019-03-24 MED ORDER — TRANEXAMIC ACID-NACL 1000-0.7 MG/100ML-% IV SOLN
1000.0000 mg | Freq: Once | INTRAVENOUS | Status: AC
  Administered 2019-03-24: 1000 mg via INTRAVENOUS
  Filled 2019-03-24: qty 100

## 2019-03-24 MED ORDER — LORATADINE 10 MG PO TABS
10.0000 mg | ORAL_TABLET | Freq: Every day | ORAL | Status: DC
  Administered 2019-03-24 – 2019-03-25 (×2): 10 mg via ORAL
  Filled 2019-03-24 (×2): qty 1

## 2019-03-24 MED ORDER — BUPIVACAINE HCL (PF) 0.75 % IJ SOLN
INTRAMUSCULAR | Status: DC | PRN
  Administered 2019-03-24: 1.6 mL via INTRATHECAL

## 2019-03-24 MED ORDER — OXYCODONE HCL 5 MG PO TABS
5.0000 mg | ORAL_TABLET | Freq: Once | ORAL | Status: AC
  Administered 2019-03-24: 5 mg via ORAL
  Filled 2019-03-24: qty 1

## 2019-03-24 MED ORDER — FLEET ENEMA 7-19 GM/118ML RE ENEM: 1.0000 | ENEMA | Freq: Once | RECTAL | Status: DC | PRN

## 2019-03-24 MED ORDER — FENTANYL CITRATE (PF) 100 MCG/2ML IJ SOLN
INTRAMUSCULAR | Status: AC
  Filled 2019-03-24: qty 2

## 2019-03-24 MED ORDER — MIDAZOLAM HCL 2 MG/2ML IJ SOLN: 0.5000 mg | Freq: Once | INTRAMUSCULAR | Status: DC | PRN

## 2019-03-24 MED ORDER — ELDERBERRY 575 MG/5ML PO SYRP: 50.0000 mg | ORAL_SOLUTION | Freq: Every day | ORAL | Status: DC

## 2019-03-24 MED ORDER — BUPIVACAINE LIPOSOME 1.3 % IJ SUSP
INTRAMUSCULAR | Status: DC | PRN
  Administered 2019-03-24: 20 mL

## 2019-03-24 MED ORDER — CELECOXIB 400 MG PO CAPS
400.0000 mg | ORAL_CAPSULE | Freq: Once | ORAL | Status: AC
  Administered 2019-03-24: 400 mg via ORAL
  Filled 2019-03-24: qty 1

## 2019-03-24 MED ORDER — PROMETHAZINE HCL 25 MG/ML IJ SOLN: 6.2500 mg | INTRAMUSCULAR | Status: DC | PRN

## 2019-03-24 MED ORDER — PROPOFOL 500 MG/50ML IV EMUL
INTRAVENOUS | Status: DC | PRN
  Administered 2019-03-24 (×2): via INTRAVENOUS
  Administered 2019-03-24: 50 ug/kg/min via INTRAVENOUS
  Administered 2019-03-24: 09:00:00 via INTRAVENOUS

## 2019-03-24 MED ORDER — 0.9 % SODIUM CHLORIDE (POUR BTL) OPTIME
TOPICAL | Status: DC | PRN
  Administered 2019-03-24: 1000 mL

## 2019-03-24 MED ORDER — METHOCARBAMOL 1000 MG/10ML IJ SOLN
500.0000 mg | Freq: Once | INTRAVENOUS | Status: AC
  Administered 2019-03-24: 500 mg via INTRAVENOUS
  Filled 2019-03-24: qty 5

## 2019-03-24 MED ORDER — HYDROMORPHONE HCL 1 MG/ML IJ SOLN: 0.2500 mg | INTRAMUSCULAR | Status: DC | PRN

## 2019-03-24 MED ORDER — SODIUM CHLORIDE 0.9 % IR SOLN
Status: DC | PRN
  Administered 2019-03-24: 3000 mL

## 2019-03-24 MED ORDER — SODIUM CHLORIDE 0.9 % IV BOLUS
250.0000 mL | Freq: Once | INTRAVENOUS | Status: AC
  Administered 2019-03-24: 17:00:00 250 mL via INTRAVENOUS

## 2019-03-24 MED ORDER — CYANOCOBALAMIN 1000 MCG/ML IJ SOLN: 1000.0000 ug | INTRAMUSCULAR | Status: DC

## 2019-03-24 MED ORDER — PROPOFOL 10 MG/ML IV BOLUS
INTRAVENOUS | Status: AC
  Filled 2019-03-24: qty 20

## 2019-03-24 MED ORDER — PROPOFOL 10 MG/ML IV BOLUS
INTRAVENOUS | Status: AC
  Filled 2019-03-24: qty 40

## 2019-03-24 MED ORDER — METOCLOPRAMIDE HCL 10 MG PO TABS: 5.0000 mg | ORAL_TABLET | Freq: Three times a day (TID) | ORAL | Status: DC | PRN

## 2019-03-24 MED ORDER — MIDAZOLAM HCL 2 MG/2ML IJ SOLN
INTRAMUSCULAR | Status: AC
  Filled 2019-03-24: qty 2

## 2019-03-24 MED ORDER — HYDROCHLOROTHIAZIDE 25 MG PO TABS
25.0000 mg | ORAL_TABLET | Freq: Every day | ORAL | Status: DC
  Administered 2019-03-24 – 2019-03-25 (×2): 25 mg via ORAL
  Filled 2019-03-24 (×2): qty 1

## 2019-03-24 MED ORDER — EPHEDRINE 5 MG/ML INJ
INTRAVENOUS | Status: AC
  Filled 2019-03-24: qty 10

## 2019-03-24 MED ORDER — CELECOXIB 100 MG PO CAPS
200.0000 mg | ORAL_CAPSULE | Freq: Two times a day (BID) | ORAL | Status: DC
  Administered 2019-03-24 – 2019-03-25 (×2): 200 mg via ORAL
  Filled 2019-03-24 (×2): qty 2

## 2019-03-24 MED ORDER — HYDROCODONE-ACETAMINOPHEN 5-325 MG PO TABS: 1.0000 | ORAL_TABLET | ORAL | Status: DC | PRN

## 2019-03-24 MED ORDER — HYDROCODONE-ACETAMINOPHEN 7.5-325 MG PO TABS: 1.0000 | ORAL_TABLET | Freq: Once | ORAL | Status: DC | PRN

## 2019-03-24 MED ORDER — MORPHINE SULFATE (PF) 2 MG/ML IV SOLN: 0.5000 mg | INTRAVENOUS | Status: DC | PRN

## 2019-03-24 MED ORDER — PREGABALIN 50 MG PO CAPS
50.0000 mg | ORAL_CAPSULE | Freq: Once | ORAL | Status: AC
  Administered 2019-03-24: 50 mg via ORAL

## 2019-03-24 MED ORDER — EPHEDRINE SULFATE 50 MG/ML IJ SOLN
INTRAMUSCULAR | Status: DC | PRN
  Administered 2019-03-24 (×5): 10 mg via INTRAVENOUS

## 2019-03-24 MED ORDER — PHENYLEPHRINE 40 MCG/ML (10ML) SYRINGE FOR IV PUSH (FOR BLOOD PRESSURE SUPPORT)
PREFILLED_SYRINGE | INTRAVENOUS | Status: AC
  Filled 2019-03-24: qty 10

## 2019-03-24 MED ORDER — CALCIUM CARBONATE-VITAMIN D 500-200 MG-UNIT PO TABS
1.0000 | ORAL_TABLET | Freq: Every day | ORAL | Status: DC
  Administered 2019-03-24 – 2019-03-25 (×2): 1 via ORAL
  Filled 2019-03-24 (×2): qty 1

## 2019-03-24 MED ORDER — ONDANSETRON HCL 4 MG/2ML IJ SOLN
4.0000 mg | Freq: Four times a day (QID) | INTRAMUSCULAR | Status: DC | PRN
  Administered 2019-03-24: 4 mg via INTRAVENOUS
  Filled 2019-03-24: qty 2

## 2019-03-24 MED ORDER — MENTHOL 3 MG MT LOZG: 1.0000 | LOZENGE | OROMUCOSAL | Status: DC | PRN

## 2019-03-24 MED ORDER — BUPIVACAINE-EPINEPHRINE (PF) 0.25% -1:200000 IJ SOLN
INTRAMUSCULAR | Status: AC
  Filled 2019-03-24: qty 60

## 2019-03-24 MED ORDER — PHENOL 1.4 % MT LIQD: 1.0000 | OROMUCOSAL | Status: DC | PRN

## 2019-03-24 MED ORDER — ONDANSETRON HCL 4 MG/2ML IJ SOLN
INTRAMUSCULAR | Status: AC
  Filled 2019-03-24: qty 2

## 2019-03-24 MED ORDER — POTASSIUM CHLORIDE CRYS ER 20 MEQ PO TBCR
20.0000 meq | EXTENDED_RELEASE_TABLET | Freq: Three times a day (TID) | ORAL | Status: DC
  Administered 2019-03-24 – 2019-03-25 (×4): 20 meq via ORAL
  Filled 2019-03-24 (×4): qty 1

## 2019-03-24 MED ORDER — SODIUM CHLORIDE 0.9 % IV SOLN
INTRAVENOUS | Status: DC
  Administered 2019-03-24: 13:00:00 via INTRAVENOUS

## 2019-03-24 MED ORDER — DIPHENHYDRAMINE HCL 25 MG PO CAPS: 25.0000 mg | ORAL_CAPSULE | Freq: Four times a day (QID) | ORAL | Status: DC | PRN

## 2019-03-24 MED ORDER — ONDANSETRON HCL 4 MG/2ML IJ SOLN
4.0000 mg | Freq: Once | INTRAMUSCULAR | Status: AC
  Administered 2019-03-24: 4 mg via INTRAVENOUS
  Filled 2019-03-24: qty 2

## 2019-03-24 MED ORDER — TRANEXAMIC ACID-NACL 1000-0.7 MG/100ML-% IV SOLN
1000.0000 mg | INTRAVENOUS | Status: AC
  Administered 2019-03-24: 1000 mg via INTRAVENOUS
  Filled 2019-03-24: qty 100

## 2019-03-24 MED ORDER — GABAPENTIN 300 MG PO CAPS
300.0000 mg | ORAL_CAPSULE | Freq: Three times a day (TID) | ORAL | Status: DC
  Administered 2019-03-24 – 2019-03-25 (×4): 300 mg via ORAL
  Filled 2019-03-24 (×4): qty 1

## 2019-03-24 MED ORDER — METHOCARBAMOL 1000 MG/10ML IJ SOLN
500.0000 mg | Freq: Four times a day (QID) | INTRAVENOUS | Status: DC | PRN
  Filled 2019-03-24: qty 5

## 2019-03-24 MED ORDER — ADULT MULTIVITAMIN W/MINERALS CH
1.0000 | ORAL_TABLET | Freq: Every day | ORAL | Status: DC
  Administered 2019-03-24 – 2019-03-25 (×2): 1 via ORAL
  Filled 2019-03-24 (×3): qty 1

## 2019-03-24 MED ORDER — METOCLOPRAMIDE HCL 5 MG/ML IJ SOLN: 5.0000 mg | Freq: Three times a day (TID) | INTRAMUSCULAR | Status: DC | PRN

## 2019-03-24 MED ORDER — LACTATED RINGERS IV SOLN
INTRAVENOUS | Status: DC
  Administered 2019-03-24: 08:00:00 via INTRAVENOUS

## 2019-03-24 MED ORDER — ARTIFICIAL TEARS OPHTHALMIC OINT
TOPICAL_OINTMENT | OPHTHALMIC | Status: AC
  Filled 2019-03-24: qty 3.5

## 2019-03-24 MED ORDER — POLYETHYLENE GLYCOL 3350 17 G PO PACK: 17.0000 g | PACK | Freq: Every day | ORAL | Status: DC | PRN

## 2019-03-24 MED ORDER — MECLIZINE HCL 12.5 MG PO TABS: 25.0000 mg | ORAL_TABLET | Freq: Three times a day (TID) | ORAL | Status: DC | PRN

## 2019-03-24 MED ORDER — ACETAMINOPHEN 325 MG PO TABS: 325.0000 mg | ORAL_TABLET | Freq: Four times a day (QID) | ORAL | Status: DC | PRN

## 2019-03-24 MED ORDER — DEXAMETHASONE SODIUM PHOSPHATE 10 MG/ML IJ SOLN
10.0000 mg | Freq: Once | INTRAMUSCULAR | Status: AC
  Administered 2019-03-25: 10:00:00 10 mg via INTRAVENOUS
  Filled 2019-03-24: qty 1

## 2019-03-24 MED ORDER — VITAMIN D 25 MCG (1000 UNIT) PO TABS
1000.0000 [IU] | ORAL_TABLET | Freq: Every day | ORAL | Status: DC
  Administered 2019-03-24 – 2019-03-25 (×2): 1000 [IU] via ORAL
  Filled 2019-03-24 (×2): qty 1

## 2019-03-24 MED ORDER — PHENYLEPHRINE HCL (PRESSORS) 10 MG/ML IV SOLN
INTRAVENOUS | Status: DC | PRN
  Administered 2019-03-24: 80 ug via INTRAVENOUS
  Administered 2019-03-24: 40 ug via INTRAVENOUS
  Administered 2019-03-24 (×2): 80 ug via INTRAVENOUS
  Administered 2019-03-24: 40 ug via INTRAVENOUS
  Administered 2019-03-24: 80 ug via INTRAVENOUS

## 2019-03-24 MED ORDER — ONDANSETRON HCL 4 MG PO TABS: 4.0000 mg | ORAL_TABLET | Freq: Four times a day (QID) | ORAL | Status: DC | PRN

## 2019-03-24 MED ORDER — CHLORHEXIDINE GLUCONATE 4 % EX LIQD: 60.0000 mL | Freq: Once | CUTANEOUS | Status: DC

## 2019-03-24 MED ORDER — ONDANSETRON HCL 4 MG/2ML IJ SOLN
INTRAMUSCULAR | Status: DC | PRN
  Administered 2019-03-24: 4 mg via INTRAVENOUS

## 2019-03-24 MED ORDER — PANTOPRAZOLE SODIUM 40 MG PO TBEC
40.0000 mg | DELAYED_RELEASE_TABLET | Freq: Every day | ORAL | Status: DC | PRN
  Administered 2019-03-25: 10:00:00 40 mg via ORAL

## 2019-03-24 MED ORDER — DOCUSATE SODIUM 100 MG PO CAPS
100.0000 mg | ORAL_CAPSULE | Freq: Two times a day (BID) | ORAL | Status: DC
  Administered 2019-03-24 – 2019-03-25 (×3): 100 mg via ORAL
  Filled 2019-03-24 (×3): qty 1

## 2019-03-24 MED ORDER — HYDROCODONE-ACETAMINOPHEN 7.5-325 MG PO TABS
1.0000 | ORAL_TABLET | ORAL | Status: DC | PRN
  Administered 2019-03-24 – 2019-03-25 (×2): 1 via ORAL
  Filled 2019-03-24 (×2): qty 1

## 2019-03-24 MED ORDER — CEFAZOLIN SODIUM-DEXTROSE 2-4 GM/100ML-% IV SOLN
2.0000 g | INTRAVENOUS | Status: AC
  Administered 2019-03-24: 2 g via INTRAVENOUS
  Filled 2019-03-24: qty 100

## 2019-03-24 MED ORDER — METHOCARBAMOL 500 MG PO TABS: 500.0000 mg | ORAL_TABLET | Freq: Four times a day (QID) | ORAL | Status: DC | PRN

## 2019-03-24 MED ORDER — MIDAZOLAM HCL 5 MG/5ML IJ SOLN
INTRAMUSCULAR | Status: DC | PRN
  Administered 2019-03-24: 2 mg via INTRAVENOUS

## 2019-03-24 MED ORDER — ASPIRIN 81 MG PO CHEW
81.0000 mg | CHEWABLE_TABLET | Freq: Two times a day (BID) | ORAL | Status: DC
  Administered 2019-03-24 – 2019-03-25 (×2): 81 mg via ORAL
  Filled 2019-03-24 (×2): qty 1

## 2019-03-24 MED ORDER — BUPIVACAINE-EPINEPHRINE (PF) 0.25% -1:200000 IJ SOLN
INTRAMUSCULAR | Status: DC | PRN
  Administered 2019-03-24: 20 mL via PERINEURAL

## 2019-03-24 MED ORDER — POVIDONE-IODINE 10 % EX SWAB: 2.0000 "application " | Freq: Once | CUTANEOUS | Status: DC

## 2019-03-24 MED ORDER — BUPIVACAINE LIPOSOME 1.3 % IJ SUSP
INTRAMUSCULAR | Status: AC
  Filled 2019-03-24: qty 20

## 2019-03-24 SURGICAL SUPPLY — 75 items
ATTUNE MED DOME PAT 32 KNEE (Knees) ×1 IMPLANT
ATTUNE MED DOME PAT 32MM KNEE (Knees) ×1 IMPLANT
ATTUNE PS FEM LT SZ 4 CEM KNEE (Femur) ×2 IMPLANT
BANDAGE ESMARK 6X9 LF (GAUZE/BANDAGES/DRESSINGS) ×1 IMPLANT
BASEPLATE TIB CMT FB PCKT SZ4 (Stem) ×2 IMPLANT
BIT DRILL 3.2X128 (BIT) IMPLANT
BIT DRILL 3.2X128MM (BIT)
BLADE HEX COATED 2.75 (ELECTRODE) ×3 IMPLANT
BLADE SAG 18X100X1.27 (BLADE) ×2 IMPLANT
BLADE SAGITTAL 25.0X1.27X90 (BLADE) ×2 IMPLANT
BLADE SAGITTAL 25.0X1.27X90MM (BLADE) ×1
BLADE SAW SAG 90X13X1.27 (BLADE) ×2 IMPLANT
BNDG CMPR 9X6 STRL LF SNTH (GAUZE/BANDAGES/DRESSINGS) ×1
BNDG CMPR STD VLCR NS LF 5.8X4 (GAUZE/BANDAGES/DRESSINGS) ×2
BNDG CMPR STD VLCR NS LF 5.8X6 (GAUZE/BANDAGES/DRESSINGS) ×1
BNDG ELASTIC 4X5.8 VLCR NS LF (GAUZE/BANDAGES/DRESSINGS) ×8 IMPLANT
BNDG ELASTIC 6X5.8 VLCR NS LF (GAUZE/BANDAGES/DRESSINGS) ×4 IMPLANT
BNDG ESMARK 6X9 LF (GAUZE/BANDAGES/DRESSINGS) ×3
BSPLAT TIB 4 CMNT FX BRNG STRL (Stem) ×1 IMPLANT
CEMENT HV SMART SET (Cement) ×6 IMPLANT
CLOTH BEACON ORANGE TIMEOUT ST (SAFETY) ×3 IMPLANT
COOLER CRYO CUFF IC AND MOTOR (MISCELLANEOUS) ×3 IMPLANT
COVER LIGHT HANDLE STERIS (MISCELLANEOUS) ×6 IMPLANT
COVER WAND RF STERILE (DRAPES) ×3 IMPLANT
CUFF CRYO KNEE18X23 MED (MISCELLANEOUS) ×2 IMPLANT
CUFF TOURN SGL QUICK 34 (TOURNIQUET CUFF) ×3
CUFF TRNQT CYL 34X4.125X (TOURNIQUET CUFF) ×1 IMPLANT
DECANTER SPIKE VIAL GLASS SM (MISCELLANEOUS) ×3 IMPLANT
DRAPE BACK TABLE (DRAPES) ×3 IMPLANT
DRAPE EXTREMITY T 121X128X90 (DISPOSABLE) ×3 IMPLANT
DRSG MEPILEX BORDER 4X12 (GAUZE/BANDAGES/DRESSINGS) ×3 IMPLANT
DURAPREP 26ML APPLICATOR (WOUND CARE) ×6 IMPLANT
ELECT REM PT RETURN 9FT ADLT (ELECTROSURGICAL) ×3
ELECTRODE REM PT RTRN 9FT ADLT (ELECTROSURGICAL) ×1 IMPLANT
GLOVE BIO SURGEON STRL SZ7 (GLOVE) ×8 IMPLANT
GLOVE BIOGEL PI IND STRL 7.0 (GLOVE) ×2 IMPLANT
GLOVE BIOGEL PI INDICATOR 7.0 (GLOVE) ×10
GLOVE SKINSENSE NS SZ8.0 LF (GLOVE) ×4
GLOVE SKINSENSE STRL SZ8.0 LF (GLOVE) ×2 IMPLANT
GLOVE SS N UNI LF 8.5 STRL (GLOVE) ×3 IMPLANT
GOWN STRL REUS W/TWL LRG LVL3 (GOWN DISPOSABLE) ×9 IMPLANT
GOWN STRL REUS W/TWL XL LVL3 (GOWN DISPOSABLE) ×3 IMPLANT
HANDPIECE INTERPULSE COAX TIP (DISPOSABLE) ×3
HOOD W/PEELAWAY (MISCELLANEOUS) ×12 IMPLANT
INSERT TIB ATTUNE FB SZ4X5 (Insert) ×2 IMPLANT
INST SET MAJOR BONE (KITS) ×3 IMPLANT
IV NS IRRIG 3000ML ARTHROMATIC (IV SOLUTION) ×3 IMPLANT
KIT BLADEGUARD II DBL (SET/KITS/TRAYS/PACK) ×3 IMPLANT
KIT TURNOVER KIT A (KITS) ×3 IMPLANT
MANIFOLD NEPTUNE II (INSTRUMENTS) ×3 IMPLANT
MARKER SKIN DUAL TIP RULER LAB (MISCELLANEOUS) ×3 IMPLANT
NDL HYPO 18GX1.5 BLUNT FILL (NEEDLE) ×1 IMPLANT
NDL HYPO 21X1.5 SAFETY (NEEDLE) ×1 IMPLANT
NEEDLE HYPO 18GX1.5 BLUNT FILL (NEEDLE) ×3 IMPLANT
NEEDLE HYPO 21X1.5 SAFETY (NEEDLE) ×3 IMPLANT
NS IRRIG 1000ML POUR BTL (IV SOLUTION) ×3 IMPLANT
PACK TOTAL JOINT (CUSTOM PROCEDURE TRAY) ×3 IMPLANT
PAD ARMBOARD 7.5X6 YLW CONV (MISCELLANEOUS) ×3 IMPLANT
PILLOW KNEE EXTENSION 0 DEG (MISCELLANEOUS) ×3 IMPLANT
PIN/DRILL PACK ORTHO 1/8X3.0 (PIN) ×4 IMPLANT
SAW OSC TIP CART 19.5X105X1.3 (SAW) ×3 IMPLANT
SET BASIN LINEN APH (SET/KITS/TRAYS/PACK) ×3 IMPLANT
SET HNDPC FAN SPRY TIP SCT (DISPOSABLE) ×1 IMPLANT
STAPLER VISISTAT 35W (STAPLE) ×3 IMPLANT
SUT BRALON NAB BRD #1 30IN (SUTURE) ×5 IMPLANT
SUT MNCRL 0 VIOLET CTX 36 (SUTURE) ×1 IMPLANT
SUT MON AB 0 CT1 (SUTURE) ×5 IMPLANT
SUT MONOCRYL 0 CTX 36 (SUTURE) ×4
SYR 20ML LL LF (SYRINGE) ×9 IMPLANT
SYR BULB IRRIGATION 50ML (SYRINGE) ×3 IMPLANT
TOWEL OR 17X26 4PK STRL BLUE (TOWEL DISPOSABLE) ×3 IMPLANT
TOWER CARTRIDGE SMART MIX (DISPOSABLE) ×3 IMPLANT
TRAY FOLEY MTR SLVR 16FR STAT (SET/KITS/TRAYS/PACK) ×3 IMPLANT
WATER STERILE IRR 1000ML POUR (IV SOLUTION) ×6 IMPLANT
YANKAUER SUCT 12FT TUBE ARGYLE (SUCTIONS) ×3 IMPLANT

## 2019-03-24 NOTE — Brief Op Note (Signed)
03/24/2019  9:54 AM  PATIENT:  Sheri Jones  73 y.o. female  PRE-OPERATIVE DIAGNOSIS:  left knee osteoarthritis  POST-OPERATIVE DIAGNOSIS:  left knee osteoarthritis  PROCEDURE:  Procedure(s): TOTAL KNEE ARTHROPLASTY (Left)   IMPLANTS:  ATTUNE/DEPUY  44F 4T 5 POLY PS  32 P   CUTS DISTAL FEMUR: 10, +2 TOTAL 12MM/4 DEGREE LEFT   TIBIA 4 FROM LATERAL SIDE PATELLA  10 MM (PATELLA WAS 20 MM THICK)  SURGEON:  Surgeon(s) and Role:    * Carole Civil, MD - Primary  Surgical details  Patient was seen in preop identified as Sheri Jones.  Her chart was reviewed and updated and her surgical site was confirmed and marked his left knee  She was taken to the operating room for spinal anesthetic which was successful she was placed in the supine position.  A Foley catheter was inserted sterilely  The left leg was then prepped and draped sterilely and a timeout was completed.  Implants were confirmed to be in the room radiographs were available and reviewed.  The tourniquet was elevated to 300 mmHg after exsanguination of the limb with a 6 inch Esmarch.  Midline incision was made over the left knee subcutaneous tissue was divided down to the extensor mechanism which was then divided via medial arthrotomy patella was everted fat pad was excised.  Anterior horns of the medial lateral meniscus were resected.  The ACL which was very tenuous was resected PCL was resected and an osteotome was used to remove bone spurs which had encroached on the notch  The tibia was then subluxated forward and the remaining meniscal horns were excised.  Three 8 inch drill bit was used into the femoral canal which was then irrigated and then suctioned until clear fluid was observed.  The distal femoral cut was set for 4 degree left 10 mm resection.  Blocks were pinned in place and the distal cut was made.  The lateral femoral condyle was deficient distally however I reserved further resection until checking  the extension gap  We then prepared the tibia for proximal resection we used a 4 mm nonslotted resection stylus on the lateral side set our external guide for neutral tibial cut with standard references of tibial spine medial third tibial tubercle and malleolar line.  Anatomic slope was aimed for as well.  The block was then pinned and the proximal resection was performed.  Extension gap was then checked and found to be tight we removed an additional 2 mm of distal femur and recheck the extension gap which was then at 0 degrees  We then measured the femur which measured a size 4 pin the blocks in place placed appropriate retractors and then made the distal 4 femoral cuts followed by checking of the flexion gap.  Flexion gap was stable with the 5 as was the extension gap and we proceeded to cut the notch of the femur.  This allowed [place] to then perform a trial reduction with a 4 femur and a 4 tibia and a 5 insert.  This gave 130 degrees of flexion and full extension with good stability in extension as well as flexion.  Femoral lug holes were then drilled in place  We then punched the proximal tibia  Patella measured 20 mm in thickness.  It was prepared by removing the proximal and distal soft tissues including the fat pad and residual synovium superior to the patella at the quadriceps patellar  Junction  The patella was resected down  to a 10 mm thickness and a 32 x 9 mm patella button fit nicely and lug holes were drilled.  The joint was then irrigated and bone was dried Exparel was injected into the medial and lateral collateral ligaments quadriceps tendon patellar tendon and medial soft tissue sleeve a total of 20 cc full-strength exparel.  The cement was then mixed and implants were inserted excess cement was allowed to cure and excess cement was removed.  Final range of motion check passive range of motion was 125 degrees and knee was in full extension collateral ligaments were stable in  extension  Wound was irrigated again and closure was performed as follows  # 1   Braylon running stitch for the extensor mechanism followed by 0 Monocryl in divided fashion for the subcu tissue and then staples  We also injected soft tissue with Marcaine with epinephrine cc  Sterile dressing was applied tourniquet was released the limb was wrapped from the toes to the groin with Ace bandage Cryo/Cuff was applied and activated patient was taken recovery room in stable condition  Plan for standard postop routine immediate weightbearing immediate physical therapy.  PHYSICIAN ASSISTANT:   ASSISTANTS: CYNTHIA WRENN  ANESTHESIA:   spinal  EBL:  25 mL   BLOOD ADMINISTERED:none  DRAINS: none   LOCAL MEDICATIONS USED:  MARCAINE  30 CC  and OTHER EXPAREL 20 FULL STRENGTH  SPECIMEN:  No Specimen  DISPOSITION OF SPECIMEN:  N/A  COUNTS:  YES  TOURNIQUET:   Total Tourniquet Time Documented: Thigh (Left) - 98 minutes Total: Thigh (Left) - 98 minutes   DICTATION: .Viviann Spare Dictation  PLAN OF CARE: Admit to inpatient   PATIENT DISPOSITION:  PACU - hemodynamically stable.   Delay start of Pharmacological VTE agent (>24hrs) due to surgical blood loss or risk of bleeding: yes

## 2019-03-24 NOTE — Op Note (Signed)
03/24/2019  9:54 AM  PATIENT:  Sheri Jones  73 y.o. female  PRE-OPERATIVE DIAGNOSIS:  left knee osteoarthritis  POST-OPERATIVE DIAGNOSIS:  left knee osteoarthritis  PROCEDURE:  Procedure(s): TOTAL KNEE ARTHROPLASTY (Left)   IMPLANTS:  ATTUNE/DEPUY  56F 4T 5 POLY PS  32 P   CUTS DISTAL FEMUR: 10, +2 TOTAL 12MM/4 DEGREE LEFT   TIBIA 4 FROM LATERAL SIDE PATELLA  10 MM (PATELLA WAS 20 MM THICK)  SURGEON:  Surgeon(s) and Role:    * Carole Civil, MD - Primary  Surgical details  Patient was seen in preop identified as Sheri Jones.  Her chart was reviewed and updated and her surgical site was confirmed and marked his left knee  She was taken to the operating room for spinal anesthetic which was successful she was placed in the supine position.  A Foley catheter was inserted sterilely  The left leg was then prepped and draped sterilely and a timeout was completed.  Implants were confirmed to be in the room radiographs were available and reviewed.  The tourniquet was elevated to 300 mmHg after exsanguination of the limb with a 6 inch Esmarch.  Midline incision was made over the left knee subcutaneous tissue was divided down to the extensor mechanism which was then divided via medial arthrotomy patella was everted fat pad was excised.  Anterior horns of the medial lateral meniscus were resected.  The ACL which was very tenuous was resected PCL was resected and an osteotome was used to remove bone spurs which had encroached on the notch  The tibia was then subluxated forward and the remaining meniscal horns were excised.  Three 8 inch drill bit was used into the femoral canal which was then irrigated and then suctioned until clear fluid was observed.  The distal femoral cut was set for 4 degree left 10 mm resection.  Blocks were pinned in place and the distal cut was made.  The lateral femoral condyle was deficient distally however I reserved further resection until checking  the extension gap  We then prepared the tibia for proximal resection we used a 4 mm nonslotted resection stylus on the lateral side set our external guide for neutral tibial cut with standard references of tibial spine medial third tibial tubercle and malleolar line.  Anatomic slope was aimed for as well.  The block was then pinned and the proximal resection was performed.  Extension gap was then checked and found to be tight we removed an additional 2 mm of distal femur and recheck the extension gap which was then at 0 degrees  We then measured the femur which measured a size 4 pin the blocks in place placed appropriate retractors and then made the distal 4 femoral cuts followed by checking of the flexion gap.  Flexion gap was stable with the 5 as was the extension gap and we proceeded to cut the notch of the femur.  This allowed [place] to then perform a trial reduction with a 4 femur and a 4 tibia and a 5 insert.  This gave 130 degrees of flexion and full extension with good stability in extension as well as flexion.  Femoral lug holes were then drilled in place  We then punched the proximal tibia  Patella measured 20 mm in thickness.  It was prepared by removing the proximal and distal soft tissues including the fat pad and residual synovium superior to the patella at the quadriceps patellar  Junction  The patella was resected down  to a 10 mm thickness and a 32 x 9 mm patella button fit nicely and lug holes were drilled.  The joint was then irrigated and bone was dried Exparel was injected into the medial and lateral collateral ligaments quadriceps tendon patellar tendon and medial soft tissue sleeve a total of 20 cc full-strength exparel.  The cement was then mixed and implants were inserted excess cement was allowed to cure and excess cement was removed.  Final range of motion check passive range of motion was 125 degrees and knee was in full extension collateral ligaments were stable in  extension  Wound was irrigated again and closure was performed as follows  # 1   Braylon running stitch for the extensor mechanism followed by 0 Monocryl in divided fashion for the subcu tissue and then staples  We also injected soft tissue with Marcaine with epinephrine cc  Sterile dressing was applied tourniquet was released the limb was wrapped from the toes to the groin with Ace bandage Cryo/Cuff was applied and activated patient was taken recovery room in stable condition  Plan for standard postop routine immediate weightbearing immediate physical therapy.  PHYSICIAN ASSISTANT:   ASSISTANTS: CYNTHIA WRENN  ANESTHESIA:   spinal  EBL:  25 mL   BLOOD ADMINISTERED:none  DRAINS: none   LOCAL MEDICATIONS USED:  MARCAINE  30 CC  and OTHER EXPAREL 20 FULL STRENGTH  SPECIMEN:  No Specimen  DISPOSITION OF SPECIMEN:  N/A  COUNTS:  YES  TOURNIQUET:   Total Tourniquet Time Documented: Thigh (Left) - 98 minutes Total: Thigh (Left) - 98 minutes   DICTATION: .Viviann Spare Dictation  PLAN OF CARE: Admit to inpatient   PATIENT DISPOSITION:  PACU - hemodynamically stable.   Delay start of Pharmacological VTE agent (>24hrs) due to surgical blood loss or risk of bleeding: yes

## 2019-03-24 NOTE — Evaluation (Signed)
Physical Therapy Evaluation Patient Details Name: Sheri Jones MRN: [id number] DOB: 21-Apr-1946 Today's Date: 03/24/2019  LEFT KNEE ROM: 0 - 95 degrees AMBULATION DISTANCE: 6 feet using RW with Mod assist    History of Present Illness  Sheri Jones. Steuer is a 73 y/o female, s/p Left TKA on 03/24/19 with the diagnosis of left knee osteoarthritis  Clinical Impression  Patient instructed in HEP with fair/good carryover after verbal/tactile cueing, limited to a few steps at bedside mostly due to feeling nauseous and weak and tolerated sitting up in chair with her sister in room after therapy.  Patient will benefit from continued physical therapy in hospital and recommended venue below to increase strength, balance, endurance for safe ADLs and gait.    Follow Up Recommendations Supervision for mobility/OOB;Supervision - Intermittent;SNF    Equipment Recommendations  None recommended by PT    Recommendations for Other Services       Precautions / Restrictions Precautions Precautions: Fall Restrictions Weight Bearing Restrictions: Yes LLE Weight Bearing: Weight bearing as tolerated      Mobility  Bed Mobility Overal bed mobility: Needs Assistance Bed Mobility: Supine to Sit     Supine to sit: Supervision     General bed mobility comments: increased time, slightly labored movement  Transfers Overall transfer level: Needs assistance Equipment used: Rolling walker (2 wheeled) Transfers: Sit to/from [employer] Transfers Sit to Stand: Min assist;Mod assist Stand pivot transfers: Min assist       General transfer comment: slow labored movement  Ambulation/Gait Ambulation/Gait assistance: Min assist;Mod assist Gait Distance (Feet): 6 Feet Assistive device: Rolling walker (2 wheeled) Gait Pattern/deviations: Decreased step length - right;Decreased step length - left;Decreased stance time - left;Decreased stride length Gait velocity: slow   General Gait Details:  limited to 6-7 slow labored steps at bedside mostly due to feeling nauseous  Stairs            Wheelchair Mobility    Modified Rankin (Stroke Patients Only)       Balance Overall balance assessment: Needs assistance Sitting-balance support: Feet supported;No upper extremity supported Sitting balance-Leahy Scale: Good Sitting balance - Comments: seated at EOB   Standing balance support: During functional activity;Bilateral upper extremity supported Standing balance-Leahy Scale: Fair Standing balance comment: using RW                             Pertinent Vitals/Pain Pain Assessment: 0-10 Pain Score: 3  Pain Location: left knee at rest Pain Descriptors / Indicators: Sore;Grimacing Pain Intervention(s): Limited activity within patient's tolerance;Monitored during session;Repositioned    Home Living Family/patient expects to be discharged to:: Private residence Living Arrangements: Alone Available Help at Discharge: Family;Available 24 hours/day(Her sister will be staying with her for a few days) Type of Home: House Home Access: Stairs to enter Entrance Stairs-Rails: None Entrance Stairs-Number of Steps: 1 small step Home Layout: One level Home Equipment: Shower seat - built in;Walker - 2 wheels;Cane - single point;Bedside commode;Shower seat      Prior Function Level of Independence: Independent with assistive device(s)         Comments: Hydrographic surveyor with SPC PRN, drives     Hand Dominance   Dominant Hand: Right    Extremity/Trunk Assessment   Upper Extremity Assessment Upper Extremity Assessment: Overall WFL for tasks assessed    Lower Extremity Assessment Lower Extremity Assessment: Generalized weakness;LLE deficits/detail LLE Deficits / Details: grossly -4/5 LLE: Unable to fully assess due  to pain LLE Sensation: WNL LLE Coordination: WNL    Cervical / Trunk Assessment Cervical / Trunk Assessment: Normal  Communication    Communication: No difficulties  Cognition Arousal/Alertness: Awake/alert Behavior During Therapy: WFL for tasks assessed/performed Overall Cognitive Status: Within Functional Limits for tasks assessed                                        General Comments      Exercises Total Joint Exercises Ankle Circles/Pumps: Supine;AROM;Left;10 reps;Strengthening Quad Sets: Supine;5 reps;Left;AROM;Strengthening Gluteal Sets: AROM;Strengthening;Left;5 reps;Supine Short Arc Quad: AAROM;Strengthening;Left;5 reps;Supine Heel Slides: AROM;Strengthening;Left;10 reps;Supine Goniometric ROM: left knee: 0-95 degrees   Assessment/Plan    PT Assessment Patient needs continued PT services  PT Problem List Decreased strength;Decreased activity tolerance;Decreased balance;Decreased mobility;Decreased range of motion       PT Treatment Interventions Gait training;Stair training;Functional mobility training;Therapeutic activities;Therapeutic exercise;Patient/family education    PT Goals (Current goals can be found in the Care Plan section)  Acute Rehab PT Goals Patient Stated Goal: return home with family to assist PT Goal Formulation: With patient/family Time For Goal Achievement: 04/07/19 Potential to Achieve Goals: Good    Frequency BID   Barriers to discharge        Co-evaluation               AM-PAC PT "6 Clicks" Mobility  Outcome Measure Help needed turning from your back to your side while in a flat bed without using bedrails?: None Help needed moving from lying on your back to sitting on the side of a flat bed without using bedrails?: None Help needed moving to and from a bed to a chair (including a wheelchair)?: A Lot Help needed standing up from a chair using your arms (e.g., wheelchair or bedside chair)?: A Lot Help needed to walk in hospital room?: A Lot Help needed climbing 3-5 steps with a railing? : Total 6 Click Score: 15    End of Session   Activity  Tolerance: Patient tolerated treatment well;Patient limited by fatigue Patient left: in chair;with call bell/phone within reach;with family/visitor present Nurse Communication: Mobility status PT Visit Diagnosis: Unsteadiness on feet (R26.81);Other abnormalities of gait and mobility (R26.89);Muscle weakness (generalized) (M62.81)    Time: 1411-1440 PT Time Calculation (min) (ACUTE ONLY): 29 min   Charges:   PT Evaluation $PT Eval Moderate Complexity: 1 Mod PT Treatments $Therapeutic Activity: 23-37 mins        3:22 PM, 03/24/19 Lonell Grandchild, MPT Physical Therapist with [facility] 336 [phone] office [phone] mobile phone

## 2019-03-24 NOTE — Plan of Care (Signed)
  Problem: Acute Rehab PT Goals(only PT should resolve) Goal: Pt Will Go Supine/Side To Sit Outcome: Progressing Flowsheets (Taken 03/24/2019 1524) Pt will go Supine/Side to Sit: Independently Goal: Patient Will Transfer Sit To/From Stand Outcome: Progressing Flowsheets (Taken 03/24/2019 1524) Patient will transfer sit to/from stand: with supervision Goal: Pt Will Transfer Bed To Chair/Chair To Bed Outcome: Progressing Flowsheets (Taken 03/24/2019 1524) Pt will Transfer Bed to Chair/Chair to Bed: with supervision Goal: Pt Will Ambulate Outcome: Progressing Flowsheets (Taken 03/24/2019 1524) Pt will Ambulate:  100 feet  with min guard assist  with rolling walker   3:24 PM, 03/24/19 Lonell Grandchild, MPT Physical Therapist with [facility] 336 [phone] office [phone] mobile phone

## 2019-03-24 NOTE — Transfer of Care (Signed)
Immediate Anesthesia Transfer of Care Note  Patient: Sheri Jones  Procedure(s) Performed: TOTAL KNEE ARTHROPLASTY (Left Knee)  Patient Location: PACU  Anesthesia Type:Spinal  Level of Consciousness: awake, alert , oriented and patient cooperative  Airway & Oxygen Therapy: Patient Spontanous Breathing  Post-op Assessment: Report given to RN and Post -op Vital signs reviewed and stable  Post vital signs: Reviewed and stable  Last Vitals:  Vitals Value Taken Time  BP 119/65 03/24/19 0957  Temp    Pulse    Resp 9 03/24/19 0958  SpO2    Vitals shown include unvalidated device data.  Last Pain:  Vitals:   03/24/19 0719  TempSrc: Oral  PainSc: 0-No pain      Patients Stated Pain Goal: 8 ([id number])  Complications: No apparent anesthesia complications

## 2019-03-24 NOTE — Anesthesia Procedure Notes (Signed)
Procedure Name: MAC Date/Time: 03/24/2019 7:26 AM Performed by: Andree Elk Amy A, CRNA Pre-anesthesia Checklist: Patient identified, Emergency Drugs available, Suction available, Patient being monitored and Timeout performed Oxygen Delivery Method: Non-rebreather mask

## 2019-03-24 NOTE — Brief Op Note (Signed)
03/24/2019  9:54 AM  PATIENT:  Sheri Jones  73 y.o. female  PRE-OPERATIVE DIAGNOSIS:  left knee osteoarthritis  POST-OPERATIVE DIAGNOSIS:  left knee osteoarthritis  PROCEDURE:  Procedure(s): TOTAL KNEE ARTHROPLASTY (Left)   IMPLANTS:  ATTUNE/DEPUY  74F 4T 10 POLY PS  32 P   CUTS DISTAL FEMUR: 10, +2 TOTAL 12MM/4 DEGREE LEFT   TIBIA 4 FROM LATERAL SIDE PATELLA  10 MM (PATELLA WAS 20 MM THICK)  SURGEON:  Surgeon(s) and Role:    * Carole Civil, MD - Primary  PHYSICIAN ASSISTANT:   ASSISTANTS: CYNTHIA WRENN  ANESTHESIA:   spinal  EBL:  25 mL   BLOOD ADMINISTERED:none  DRAINS: none   LOCAL MEDICATIONS USED:  MARCAINE  30 CC  and OTHER EXPAREL 20 FULL STRENGTH  SPECIMEN:  No Specimen  DISPOSITION OF SPECIMEN:  N/A  COUNTS:  YES  TOURNIQUET:   Total Tourniquet Time Documented: Thigh (Left) - 98 minutes Total: Thigh (Left) - 98 minutes   DICTATION: .Viviann Spare Dictation  PLAN OF CARE: Admit to inpatient   PATIENT DISPOSITION:  PACU - hemodynamically stable.   Delay start of Pharmacological VTE agent (>24hrs) due to surgical blood loss or risk of bleeding: yes

## 2019-03-24 NOTE — Anesthesia Postprocedure Evaluation (Signed)
Anesthesia Post Note  Patient: Sheri Jones  Procedure(s) Performed: TOTAL KNEE ARTHROPLASTY (Left Knee)  Patient location during evaluation: PACU Anesthesia Type: Spinal Level of consciousness: awake and alert, oriented and patient cooperative Pain management: pain level controlled Vital Signs Assessment: post-procedure vital signs reviewed and stable Respiratory status: spontaneous breathing Cardiovascular status: stable Postop Assessment: no apparent nausea or vomiting and spinal receding Anesthetic complications: no     Last Vitals:  Vitals:   03/24/19 1015 03/24/19 1030  BP: 132/76 135/72  Pulse: 85 80  Resp: 16 (!) 23  Temp:    SpO2: 95% 97%    Last Pain:  Vitals:   03/24/19 1015  TempSrc:   PainSc: 0-No pain                 Amelia Macken A

## 2019-03-24 NOTE — Anesthesia Procedure Notes (Signed)
Spinal  Patient location during procedure: OR Start time: 03/24/2019 7:34 AM End time: 03/24/2019 7:42 AM Staffing Anesthesiologist: Lenice Llamas, MD Performed: anesthesiologist  Preanesthetic Checklist Completed: patient identified, site marked, surgical consent, pre-op evaluation, timeout performed, IV checked, risks and benefits discussed and monitors and equipment checked Spinal Block Patient position: sitting Prep: Betadine Patient monitoring: heart rate, cardiac monitor, continuous pulse ox and blood pressure Approach: midline Location: L3-4 Injection technique: single-shot Needle Needle type: Quincke and Sprotte  Needle gauge: 24 G Needle length: 10 cm Needle insertion depth: 9 cm Assessment Sensory level: T8 Events: paresthesia Additional Notes CLEAR CSF PRE AND POST INJECTION Sitting , aseptic, local, +CSF x1 with 24g Sprotte - 1.6cc of 0.75% heavy Bupiv given  Supine -Foley   TRAY # LOT #

## 2019-03-24 NOTE — Interval H&P Note (Signed)
History and Physical Interval Note:  03/24/2019 7:23 AM  Pulse 78   Temp 98.1 F (36.7 C) (Oral)   CBC Latest Ref Rng & Units 03/18/2019 09/11/2018 06/20/2018  WBC 4.0 - 10.5 K/uL 4.8 4.6 4.6  Hemoglobin 12.0 - 15.0 g/dL 13.0 13.2 12.8  Hematocrit 36.0 - 46.0 % 38.7 40.1 39.4  Platelets 150 - 400 K/uL 179 208 156    Sheri Jones  has presented today for surgery, with the diagnosis of left knee osteoarthritis.  The various methods of treatment have been discussed with the patient and family. After consideration of risks, benefits and other options for treatment, the patient has consented to  Procedure(s): TOTAL KNEE ARTHROPLASTY (Left) as a surgical intervention.  The patient's history has been reviewed, patient examined, no change in status, stable for surgery.  I have reviewed the patient's chart and labs.  Questions were answered to the patient's satisfaction.     [name]

## 2019-03-24 NOTE — Anesthesia Preprocedure Evaluation (Addendum)
Anesthesia Evaluation  Patient identified by MRN, date of birth, ID band Patient awake    Reviewed: Allergy & Precautions, NPO status , Patient's Chart, lab work & pertinent test results  Airway Mallampati: II  TM Distance: >3 FB Neck ROM: Full    Dental no notable dental hx. (+) Teeth Intact   Pulmonary neg pulmonary ROS,    Pulmonary exam normal breath sounds clear to auscultation       Cardiovascular Exercise Tolerance: Good hypertension, Pt. on medications negative cardio ROS Normal cardiovascular examI Rhythm:Regular Rate:Normal     Neuro/Psych negative neurological ROS  negative psych ROS   GI/Hepatic negative GI ROS, Neg liver ROS,   Endo/Other  negative endocrine ROSdiabetesdietcontrolled - no current diabetic meds listed  Renal/GU negative Renal ROS  negative genitourinary   Musculoskeletal negative musculoskeletal ROS (+)   Abdominal   Peds negative pediatric ROS (+)  Hematology negative hematology ROS (+)   Anesthesia Other Findings   Reproductive/Obstetrics negative OB ROS                             Anesthesia Physical Anesthesia Plan  ASA: II  Anesthesia Plan: General   Post-op Pain Management:    Induction: Intravenous  PONV Risk Score and Plan: 3 and Midazolam, Ondansetron, Dexamethasone and Treatment may vary due to age or medical condition  Airway Management Planned: Oral ETT  Additional Equipment:   Intra-op Plan:   Post-operative Plan: Extubation in OR  Informed Consent: I have reviewed the patients History and Physical, chart, labs and discussed the procedure including the risks, benefits and alternatives for the proposed anesthesia with the patient or authorized representative who has indicated his/her understanding and acceptance.     Dental advisory given  Plan Discussed with: CRNA  Anesthesia Plan Comments: (Plan Full PPE use Plan GETA D/W  PT -WTP with same after Q&A  Offered SAB vs GETA -WTP with GETA Offered preop  PNB -Saphenous -wishes to skip for now-still considering it   Addendum -after further d/w RNs - WTP with SAB/TIVA as tolerated with GETA as needed  No PNB planned )       Anesthesia Quick Evaluation

## 2019-03-25 DIAGNOSIS — M1712 Unilateral primary osteoarthritis, left knee: Secondary | ICD-10-CM | POA: Diagnosis not present

## 2019-03-25 LAB — CBC
HCT: 36.8 % (ref 36.0–46.0)
Hemoglobin: 12.4 g/dL (ref 12.0–15.0)
MCH: 31.4 pg (ref 26.0–34.0)
MCHC: 33.7 g/dL (ref 30.0–36.0)
MCV: 93.2 fL (ref 80.0–100.0)
Platelets: 161 10*3/uL (ref 150–400)
RBC: 3.95 MIL/uL (ref 3.87–5.11)
RDW: 15 % (ref 11.5–15.5)
WBC: 6.4 10*3/uL (ref 4.0–10.5)
nRBC: 0 % (ref 0.0–0.2)

## 2019-03-25 LAB — BASIC METABOLIC PANEL
Anion gap: 9 (ref 5–15)
BUN: 14 mg/dL (ref 8–23)
CO2: 24 mmol/L (ref 22–32)
Calcium: 8.7 mg/dL — ABNORMAL LOW (ref 8.9–10.3)
Chloride: 105 mmol/L (ref 98–111)
Creatinine, Ser: 0.72 mg/dL (ref 0.44–1.00)
GFR calc Af Amer: >60 mL/min (ref >60)
GFR calc non Af Amer: >60 mL/min (ref >60)
Glucose, Bld: 134 mg/dL — ABNORMAL HIGH (ref 70–99)
Potassium: 3.5 mmol/L (ref 3.5–5.1)
Sodium: 138 mmol/L (ref 135–145)

## 2019-03-25 MED ORDER — MENTHOL 3 MG MT LOZG: 1.0000 | LOZENGE | OROMUCOSAL | 12 refills | Status: DC | PRN

## 2019-03-25 MED ORDER — METHOCARBAMOL 500 MG PO TABS: 500.0000 mg | ORAL_TABLET | Freq: Four times a day (QID) | ORAL | 0 refills | Status: DC | PRN

## 2019-03-25 MED ORDER — GABAPENTIN 300 MG PO CAPS: 300.0000 mg | ORAL_CAPSULE | Freq: Three times a day (TID) | ORAL | 0 refills | Status: DC

## 2019-03-25 MED ORDER — CELECOXIB 200 MG PO CAPS: 200.0000 mg | ORAL_CAPSULE | Freq: Two times a day (BID) | ORAL | 0 refills | Status: DC

## 2019-03-25 MED ORDER — TRAMADOL HCL 50 MG PO TABS: 50.0000 mg | ORAL_TABLET | Freq: Four times a day (QID) | ORAL | 2 refills | Status: DC

## 2019-03-25 MED ORDER — ASPIRIN 81 MG PO CHEW: 81.0000 mg | CHEWABLE_TABLET | Freq: Two times a day (BID) | ORAL | 0 refills | Status: DC

## 2019-03-25 MED ORDER — POLYETHYLENE GLYCOL 3350 17 G PO PACK: 17.0000 g | PACK | Freq: Every day | ORAL | 0 refills | Status: DC | PRN

## 2019-03-25 MED ORDER — BISACODYL 5 MG PO TBEC: 5.0000 mg | DELAYED_RELEASE_TABLET | Freq: Every day | ORAL | 0 refills | Status: DC | PRN

## 2019-03-25 NOTE — Progress Notes (Signed)
Physical Therapy Treatment Patient Details Name: Sheri Jones MRN: [id number] DOB: July 07, 1945 Today's Date: 03/25/2019   LEFT KNEE ROM:  0 - 100 degrees AMBULATION DISTANCE: 40 feet using RW with Min guard/Min assist    History of Present Illness Sheri Jones. Kreke is a 73 y/o female, s/p Left TKA on 03/24/19 with the diagnosis of left knee osteoarthritis    PT Comments    Patient demonstrates increased endurance/distance for ambulation, limited for left heel to toe stepping due to increased left knee pain, no loss of balance, but limited due to fatigue and increasing left knee pain.  Patient requires verbal/tactile cueing for proper hand placement during sit to stands with fair carryover and tendency to flop into chair during stand to sitting once fatigued.  Patient tolerated sitting up in chair after therapy.  Patient will benefit from continued physical therapy in hospital and recommended venue below to increase strength, balance, endurance for safe ADLs and gait.   Follow Up Recommendations  Supervision for mobility/OOB;Supervision - Intermittent;SNF     Equipment Recommendations  None recommended by PT    Recommendations for Other Services       Precautions / Restrictions Precautions Precautions: Fall Restrictions Weight Bearing Restrictions: Yes LLE Weight Bearing: Weight bearing as tolerated    Mobility  Bed Mobility Overal bed mobility: Modified Independent Bed Mobility: Supine to Sit           General bed mobility comments: increased time, labored movement  Transfers Overall transfer level: Needs assistance Equipment used: Rolling walker (2 wheeled) Transfers: Sit to/from [facility] Sit to Stand: Min assist Stand pivot transfers: Min assist       General transfer comment: tendency to flop into chair during stand to sitting once fatigued  Ambulation/Gait Ambulation/Gait assistance: Min guard;Min assist Gait Distance (Feet): 40  Feet Assistive device: Rolling walker (2 wheeled) Gait Pattern/deviations: Decreased step length - left;Decreased stance time - left;Decreased stride length;Decreased dorsiflexion - left Gait velocity: decreased   General Gait Details: increased endurance/distance for ambulation with fair/poor return for left heel to toe stepping due to increased left knee pain, unable to make it to nursing station before requesting to return to room due to fatigue/left knee pain   Stairs             Wheelchair Mobility    Modified Rankin (Stroke Patients Only)       Balance Overall balance assessment: Needs assistance Sitting-balance support: Feet supported;No upper extremity supported Sitting balance-Leahy Scale: Good Sitting balance - Comments: seated at EOB   Standing balance support: During functional activity;Bilateral upper extremity supported Standing balance-Leahy Scale: Fair Standing balance comment: using RW                            Cognition Arousal/Alertness: Awake/alert Behavior During Therapy: WFL for tasks assessed/performed Overall Cognitive Status: Within Functional Limits for tasks assessed                                        Exercises Total Joint Exercises Ankle Circles/Pumps: Supine;AROM;Left;10 reps;Strengthening Quad Sets: Supine;Left;AROM;Strengthening;10 reps Short Arc Quad: AAROM;Strengthening;Left;Supine;10 reps;AROM Heel Slides: AROM;Strengthening;Left;10 reps;Supine Goniometric ROM: left knee: 0-100 degrees    General Comments        Pertinent Vitals/Pain Pain Assessment: 0-10 Pain Score: 8  Pain Location: left knee Pain Descriptors / Indicators: Sore Pain  Intervention(s): Limited activity within patient's tolerance;Monitored during session;Premedicated before session    Home Living                      Prior Function            PT Goals (current goals can now be found in the care plan section)  Acute Rehab PT Goals Patient Stated Goal: return home with family to assist PT Goal Formulation: With patient/family Time For Goal Achievement: 04/07/19 Potential to Achieve Goals: Good Progress towards PT goals: Progressing toward goals    Frequency    BID      PT Plan      Co-evaluation              AM-PAC PT "6 Clicks" Mobility   Outcome Measure  Help needed turning from your back to your side while in a flat bed without using bedrails?: None Help needed moving from lying on your back to sitting on the side of a flat bed without using bedrails?: None Help needed moving to and from a bed to a chair (including a wheelchair)?: A Little Help needed standing up from a chair using your arms (e.g., wheelchair or bedside chair)?: A Lot Help needed to walk in hospital room?: A Little Help needed climbing 3-5 steps with a railing? : A Lot 6 Click Score: 18    End of Session Equipment Utilized During Treatment: Gait belt Activity Tolerance: Patient tolerated treatment well;Patient limited by fatigue Patient left: in chair;with call bell/phone within reach;with family/visitor present Nurse Communication: Mobility status PT Visit Diagnosis: Unsteadiness on feet (R26.81);Other abnormalities of gait and mobility (R26.89);Muscle weakness (generalized) (M62.81)     Time: [id number] PT Time Calculation (min) (ACUTE ONLY): 31 min  Charges:  $Gait Training: 8-22 mins $Therapeutic Exercise: 8-22 mins                     9:55 AM, 03/25/19 Lonell Grandchild, MPT Physical Therapist with [facility] [phone] office [phone] mobile phone

## 2019-03-25 NOTE — Progress Notes (Signed)
IV removed, 2x2 gauze and paper tape applied to site, patient tolerated well. Reviewed AVS with patient and patient's sister, Aileen Pilot, both verbalized understanding.  Patient taken to lobby via wheelchair and transported home by her sister.

## 2019-03-25 NOTE — Progress Notes (Signed)
Physical Therapy Treatment Patient Details Name: Sheri Jones MRN: [id number] DOB: 01-08-1946 Today's Date: 03/25/2019 # OF FEET WALKED: 53 with RW ROM:  Flexion: 100            Extension: 3   History of Present Illness Sheri Jones. Bartunek is a 73 y/o female, s/p Left TKA on 03/24/19 with the diagnosis of left knee osteoarthritis    PT Comments    Pt ambulated to restroom with NT.  Pt reports improved tolerance with no reports of feeling too hot like she is about to faint.  Increased distance with gait training with RW, no LOB episodes did require verbal cueing to improve heel to toe mechanics and equalize stride length.  Pt able to complete all therex with good tolerance, min verbal and tactile cueing to improve quadricep contractoin and reduce gluteal compensation.  EOS pt left in chair with LE elevated and ice applied to knee.  Pt reports pain reduced at EOS.   Follow Up Recommendations  Supervision for mobility/OOB;Supervision - Intermittent;SNF     Equipment Recommendations  None recommended by PT    Recommendations for Other Services       Precautions / Restrictions Precautions Precautions: Fall Restrictions Weight Bearing Restrictions: Yes LLE Weight Bearing: Weight bearing as tolerated    Mobility  Bed Mobility Overal bed mobility: Modified Independent Bed Mobility: Supine to Sit     Supine to sit: Supervision     General bed mobility comments: sitting in chair upon entrance, min cueing for Lt LE mobility during supine therex  Transfers Overall transfer level: Needs assistance Equipment used: Rolling walker (2 wheeled) Transfers: Sit to/from Stand Sit to Stand: Min assist         General transfer comment: tendency to flop into chair during stand to sitting once fatigued; cueing for hand placement  Ambulation/Gait Ambulation/Gait assistance: Min guard Gait Distance (Feet): 75 Feet Assistive device: Rolling walker (2 wheeled) Gait Pattern/deviations:  Decreased step length - left;Decreased stance time - left;Decreased stride length;Decreased dorsiflexion - left Gait velocity: decreased   General Gait Details: Verbal cueing for heel to toe mechanics and equal stride length with RW, no LOB episodes; improved endurance/distance wiht ambulation   Stairs             Wheelchair Mobility    Modified Rankin (Stroke Patients Only)       Balance                                            Cognition Arousal/Alertness: Awake/alert Behavior During Therapy: WFL for tasks assessed/performed Overall Cognitive Status: Within Functional Limits for tasks assessed                                        Exercises Total Joint Exercises Ankle Circles/Pumps: AROM;Left;10 reps;Strengthening;Seated Quad Sets: Supine;Left;AROM;Strengthening;10 reps Gluteal Sets: AROM;Strengthening;Left;5 reps;Supine Short Arc Quad: AAROM;Strengthening;Left;Supine;10 reps Heel Slides: AROM;Strengthening;Left;10 reps;Supine Straight Leg Raises: AAROM;Left;5 reps Long Arc Quad: AAROM;Left;5 reps Goniometric ROM: Lt knee 3-100 degrees    General Comments        Pertinent Vitals/Pain Pain Assessment: 0-10 Pain Score: 7  Pain Location: left knee Pain Descriptors / Indicators: Sore Pain Intervention(s): Monitored during session;Limited activity within patient's tolerance;Repositioned;Ice applied    Home Living  Prior Function            PT Goals (current goals can now be found in the care plan section)      Frequency    BID      PT Plan      Co-evaluation              AM-PAC PT "6 Clicks" Mobility   Outcome Measure  Help needed turning from your back to your side while in a flat bed without using bedrails?: None Help needed moving from lying on your back to sitting on the side of a flat bed without using bedrails?: None Help needed moving to and from a bed to a chair  (including a wheelchair)?: A Little Help needed standing up from a chair using your arms (e.g., wheelchair or bedside chair)?: A Lot Help needed to walk in hospital room?: A Little Help needed climbing 3-5 steps with a railing? : A Lot 6 Click Score: 18    End of Session Equipment Utilized During Treatment: Gait belt Activity Tolerance: Patient tolerated treatment well;Patient limited by fatigue Patient left: in chair;with call bell/phone within reach;with family/visitor present Nurse Communication: Mobility status PT Visit Diagnosis: Unsteadiness on feet (R26.81);Other abnormalities of gait and mobility (R26.89);Muscle weakness (generalized) (M62.81)     Time: [id number] PT Time Calculation (min) (ACUTE ONLY): 30 min  Charges:  $Therapeutic Exercise: 8-22 mins $Therapeutic Activity: 8-22 mins                     [address] LPTA; CBIS [phone]  Aldona Lento 03/25/2019, 2:06 PM

## 2019-03-25 NOTE — Progress Notes (Signed)
Patient ID: Sheri Jones, female   DOB: 05-09-1945, 73 y.o.   MRN: [id number] BP 137/71 (BP Location: Left Arm)   Pulse 97   Temp 98.2 F (36.8 C) (Axillary)   Resp 18   Ht 5\' 3"  (1.6 m)   Wt 93 kg   SpO2 98%   BMI 36.32 kg/m   Postop day 1 left total knee.  Patient says she feels fine.  She did get nauseous when she got up yesterday I gave her a bolus of fluid  She is on medication for nausea and vomiting  Her pain seems to be well controlled she is neurovascularly intact  We are considering discharge after 2 PM today.  CBC Latest Ref Rng & Units 03/25/2019 03/18/2019 09/11/2018  WBC 4.0 - 10.5 K/uL 6.4 4.8 4.6  Hemoglobin 12.0 - 15.0 g/dL 12.4 13.0 13.2  Hematocrit 36.0 - 46.0 % 36.8 38.7 40.1  Platelets 150 - 400 K/uL 161 179 208    BMP Latest Ref Rng & Units 03/25/2019 03/18/2019 02/12/2019  Glucose 70 - 99 mg/dL 134(H) 91 90  BUN 8 - 23 mg/dL 14 20 24   Creatinine 0.44 - 1.00 mg/dL 0.72 0.79 0.88  BUN/Creat Ratio 6 - 22 (calc) - - NOT APPLICABLE  Sodium A999333 - 145 mmol/L 138 143 145  Potassium 3.5 - 5.1 mmol/L 3.5 3.1(L) 3.8  Chloride 98 - 111 mmol/L 105 106 108  CO2 22 - 32 mmol/L 24 25 29   Calcium 8.9 - 10.3 mg/dL 8.7(L) 9.0 9.1

## 2019-03-25 NOTE — TOC Initial Note (Signed)
Transition of Care [facility]) - Initial/Assessment Note    Patient Details  Name: Sheri Jones MRN: [id number] Date of Birth: 10-27-1945  [facility] CM/SW Contact:    Shade Flood, LCSW Phone Number: 03/25/2019, 2:26 PM  Clinical Narrative:                  Pt here observation status with TKA. PT recommending SNF though pt told PT that she is not agreeable to that. Met with pt to assess. Discussed SNF options. Pt reported that she wanted to go home with [facility]. She states her sisters will help her at home. Dr. Aline Brochure works with [facility] for his patients. Discussed with pt who is agreeable to [facility].   Spoke with Tim at [facility] to update. Tim states that they will follow pt at home upon dc.  No other TOC needs identified at this time. Will follow and assist if any other needs arise.  Expected Discharge Plan: [place] Barriers to Discharge: Continued Medical Work up   Patient Goals and CMS Choice Patient states their goals for this hospitalization and ongoing recovery are:: return home CMS Medicare.gov Compare Post Acute Care list provided to:: Patient Choice offered to / list presented to : Patient  Expected Discharge Plan and Services Expected Discharge Plan: [place] In-house Referral: Clinical Social Work   Post Acute Care Choice: [place] arrangements for the past 2 months: [place]: PT [place]: [facility] [employer] Date [place]: 03/25/19 Time [place]: 35 Representative spoke with at [place]: [place] Arrangements/Services Living arrangements for the past 2 months: [place] Lives with:: Siblings Patient language and need for interpreter reviewed:: Yes Do you feel safe going back to the place where you live?: Yes      Need for Family Participation in Patient Care: Yes  (Comment) Care giver support system in place?: Yes (comment)   Criminal Activity/Legal Involvement Pertinent to Current Situation/Hospitalization: No - Comment as needed  Activities of Daily Living Home Assistive Devices/Equipment: None ADL Screening (condition at time of admission) Patient's cognitive ability adequate to safely complete daily activities?: Yes Is the patient deaf or have difficulty hearing?: No Does the patient have difficulty seeing, even when wearing glasses/contacts?: No Does the patient have difficulty concentrating, remembering, or making decisions?: No Patient able to express need for assistance with ADLs?: No Does the patient have difficulty dressing or bathing?: No Independently performs ADLs?: Yes (appropriate for developmental age) Does the patient have difficulty walking or climbing stairs?: Yes Weakness of Legs: Left Weakness of Arms/Hands: Both  Permission Sought/Granted                  Emotional Assessment Appearance:: Appears stated age Attitude/Demeanor/Rapport: Engaged Affect (typically observed): Pleasant Orientation: : Oriented to Self, Oriented to Place, Oriented to  Time, Oriented to Situation Alcohol / Substance Use: Not Applicable Psych Involvement: No (comment)  Admission diagnosis:  left knee osteoarthritis Patient Active Problem List   Diagnosis Date Noted  . S/P total knee arthroplasty, 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  . Primary osteoarthritis of left knee   . 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 Osteopenia 08/12/2013  . 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  Prediabetes 08/07/2011  . Vitamin D deficiency 08/07/2011  . Chronic pain of both knees 01/30/2011  . PERSONAL HX COLONIC POLYPS 07/05/2010  . KNEE, ARTHRITIS, DEGEN./OSTEO 01/16/2010  . Vitamin B12 deficiency disease 10/28/2008  . 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 Morbid obesity ([place]) 08/15/2007  . Essential hypertension 08/15/2007   PCP:  Fayrene Helper, MD Pharmacy:   [id number] [place] [id number]: [phone]: [phone]  [id number] [place], [id number]: [phone]: [phone]   Social Determinants of Health (SDOH) Interventions    Readmission Risk Interventions No flowsheet data found.

## 2019-03-26 ENCOUNTER — Telehealth: Payer: Self-pay | Admitting: Orthopedic Surgery

## 2019-03-26 ENCOUNTER — Encounter (HOSPITAL_COMMUNITY): Payer: Self-pay | Admitting: Orthopedic Surgery

## 2019-03-26 ENCOUNTER — Other Ambulatory Visit: Payer: Self-pay | Admitting: Orthopedic Surgery

## 2019-03-26 DIAGNOSIS — R112 Nausea with vomiting, unspecified: Secondary | ICD-10-CM

## 2019-03-26 DIAGNOSIS — Z9889 Other specified postprocedural states: Secondary | ICD-10-CM

## 2019-03-26 MED ORDER — ONDANSETRON HCL 4 MG PO TABS: 4.0000 mg | ORAL_TABLET | Freq: Three times a day (TID) | ORAL | 2 refills | Status: AC | PRN

## 2019-03-26 NOTE — Telephone Encounter (Signed)
Thanks, I let her know

## 2019-03-26 NOTE — Telephone Encounter (Signed)
Patient called, relays she has had her surgery as scheduled, left total knee replacement on 03/24/19; states she has been waking up in the morning feeling very warm, and also nauseous.  Please advise. (Uses [facility], [place], [place], if this information is needed)

## 2019-03-26 NOTE — Telephone Encounter (Signed)
I spoke to her, she states she is not running a fever, but feels nauseated. I have advised her to make sure she takes meds with food / she has asked if you can please send in meds for the nausea.

## 2019-03-26 NOTE — Progress Notes (Signed)
Po nv   Meds ordered this encounter  Medications  . ondansetron (ZOFRAN) 4 MG tablet    Sig: Take 1 tablet (4 mg total) by mouth every 8 (eight) hours as needed for up to 10 days for nausea or vomiting.    Dispense:  30 tablet    Refill:  2

## 2019-03-26 NOTE — Telephone Encounter (Signed)
Completed.

## 2019-03-26 NOTE — Discharge Summary (Signed)
Physician Discharge Summary  Patient ID: Sheri Jones MRN: [id number] DOB/AGE: October 13, 1945 73 y.o.  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  She had a moderate valgus deformity of the left knee surgery went well with spinal anesthesia no block was used  She walked about 6 feet day 1 and 40 feet day to her knee flexion improved to 95 degrees  Pain was controlled  CBC Latest Ref Rng & Units 03/25/2019 03/18/2019 09/11/2018  WBC 4.0 - 10.5 K/uL 6.4 4.8 4.6  Hemoglobin 12.0 - 15.0 g/dL 12.4 13.0 13.2  Hematocrit 36.0 - 46.0 % 36.8 38.7 40.1  Platelets 150 - 400 K/uL 161 179 208   BMP Latest Ref Rng & Units 03/25/2019 03/18/2019 02/12/2019  Glucose 70 - 99 mg/dL 134(H) 91 90  BUN 8 - 23 mg/dL 14 20 24   Creatinine 0.44 - 1.00 mg/dL 0.72 0.79 0.88  BUN/Creat Ratio 6 - 22 (calc) - - NOT APPLICABLE  Sodium A999333 - 145 mmol/L 138 143 145  Potassium 3.5 - 5.1 mmol/L 3.5 3.1(L) 3.8  Chloride 98 - 111 mmol/L 105 106 108  CO2 22 - 32 mmol/L 24 25 29   Calcium 8.9 - 10.3 mg/dL 8.7(L) 9.0 9.1          Discharge Exam: BP (!) 151/84 (BP Location: Left Arm)   Pulse 95   Temp (!) 97.4 F (36.3 C) (Oral)   Resp 17   Ht 5\' 3"  (1.6 m)   Wt 93 kg   SpO2 100%   BMI 36.32 kg/m  Physical Exam  Unremarkable exam neurovascular intact calf soft and supple no DVT signs.  Awake and alert  Disposition:   Discharge Instructions    CPM   Complete by: As directed    Continuous passive motion machine (CPM):      Use the CPM from 0 to 80 for 6 hours per day.      You may increase by 10 per day.  You may break it up into 2 or 3  sessions per day.      Use CPM for 2 weeks or until you are told to stop.   Call MD / Call 911   Complete by: As directed    If you experience chest pain or shortness of breath, CALL 911 and be transported to the hospital emergency room.  If you develope a fever above 101 F, pus (white drainage) or increased drainage or redness at the wound, or calf pain, call your surgeon's office.   Change dressing   Complete by: As directed    Change dressing on monday, then change the dressing daily with sterile 4 x 4 inch gauze dressing and apply TED hose.  You may clean the incision with alcohol prior to redressing.   Constipation Prevention   Complete by: As directed    Drink plenty of fluids.  Prune juice may be helpful.  You may use a stool softener, such as Colace (over the counter) 100 mg twice a day.  Use MiraLax (over the counter) for constipation as needed.   Diet - low sodium heart healthy   Complete by: As directed  Discharge instructions   Complete by: As directed    Use the cryocuff 6 x a day for 30 minutes   Increase activity slowly as tolerated   Complete by: As directed    TED hose   Complete by: As directed    Use stockings (TED hose) for 2 weeks on both leg(s).  You may remove them at night for sleeping.     Allergies as of 03/25/2019      Reactions   Milk-related Compounds    Gas        Medication List    STOP taking these medications   acetaminophen 500 MG tablet Commonly known as: TYLENOL   aspirin 81 MG tablet Replaced by: aspirin 81 MG chewable tablet     TAKE these medications   aspirin 81 MG chewable tablet Chew 1 tablet (81 mg total) by mouth 2 (two) times daily. Replaces: aspirin 81 MG tablet   bisacodyl 5 MG EC tablet Commonly known as: DULCOLAX Take 1 tablet (5 mg total) by mouth daily as needed for moderate constipation.   Calcium 600+D 600-800 MG-UNIT Tabs Generic drug: Calcium Carb-Cholecalciferol Take 2 tablets by mouth daily.   celecoxib 200  MG capsule Commonly known as: CELEBREX Take 1 capsule (200 mg total) by mouth 2 (two) times daily.   CENTRUM ULTRA WOMENS PO Take 1 tablet by mouth daily.   cetirizine 10 MG tablet Commonly known as: ZYRTEC Take 10 mg by mouth daily as needed for allergies.   cholecalciferol 25 MCG (1000 UT) tablet Commonly known as: VITAMIN D3 Take 1,000 Units by mouth daily.   cyanocobalamin 1000 MCG/ML injection Commonly known as: (VITAMIN B-12) Inject 1,000 mcg into the muscle every 30 (thirty) days.   diphenhydrAMINE 25 mg capsule Commonly known as: BENADRYL Take 25 mg by mouth every 6 (six) hours as needed for allergies.   ELDERBERRY PO Take 50 mg by mouth daily.   gabapentin 300 MG capsule Commonly known as: NEURONTIN Take 1 capsule (300 mg total) by mouth 3 (three) times daily.   hydrochlorothiazide 25 MG tablet Commonly known as: HYDRODIURIL TAKE 1 TABLET BY MOUTH EVERY DAY   meclizine 25 MG tablet Commonly known as: ANTIVERT Take 1 tablet (25 mg total) by mouth 3 (three) times daily as needed for dizziness.   menthol-cetylpyridinium 3 MG lozenge Commonly known as: CEPACOL Take 1 lozenge (3 mg total) by mouth as needed for sore throat (sore throat).   methocarbamol 500 MG tablet Commonly known as: ROBAXIN Take 1 tablet (500 mg total) by mouth every 6 (six) hours as needed for muscle spasms.   pantoprazole 20 MG tablet Commonly known as: PROTONIX TAKE 1 TABLET BY MOUTH EVERY DAY What changed:   when to take this  reasons to take this   polyethylene glycol 17 g packet Commonly known as: MIRALAX / GLYCOLAX Take 17 g by mouth daily as needed for mild constipation.   potassium chloride SA 20 MEQ tablet Commonly known as: KLOR-CON Take 1 tablet (20 mEq total) by mouth 3 (three) times daily.   traMADol 50 MG tablet Commonly known as: ULTRAM Take 1 tablet (50 mg total) by mouth every 6 (six) hours.            Discharge Care Instructions  (From admission,  onward)         Start     Ordered   03/25/19 0000  Change dressing    Comments: Change dressing on monday, then change the dressing daily with  sterile 4 x 4 inch gauze dressing and apply TED hose.  You may clean the incision with alcohol prior to redressing.   03/25/19 1504         Follow-up Information    Carole Civil, MD Follow up.   Specialties: Orthopedic Surgery, Radiology Contact information: [address] [place] [address] [phone]           Signed: [name] 03/26/2019, 8:18 AM

## 2019-03-27 ENCOUNTER — Telehealth: Payer: Self-pay | Admitting: Orthopedic Surgery

## 2019-03-27 NOTE — Telephone Encounter (Signed)
Call was from [facility] at approximately 11:35am to relay that they will be out to see patient tomorrow, 03/28/19.

## 2019-03-30 ENCOUNTER — Telehealth: Payer: Self-pay | Admitting: Orthopedic Surgery

## 2019-03-30 NOTE — Telephone Encounter (Signed)
Maria from [facility] would like for you to call her regarding giving verbal PT orders for this patient 1 x a week for 1 week and then 3 x a week for 2 weeks.  Please call Verdis Frederickson when you have a moment at [phone]  Thanks

## 2019-03-30 NOTE — Telephone Encounter (Signed)
Gave verbal orders Have also advised surgical dressing stays on until follow up

## 2019-04-07 ENCOUNTER — Telehealth: Payer: Self-pay | Admitting: *Deleted

## 2019-04-07 ENCOUNTER — Other Ambulatory Visit: Payer: Self-pay

## 2019-04-07 DIAGNOSIS — I10 Essential (primary) hypertension: Secondary | ICD-10-CM

## 2019-04-07 DIAGNOSIS — Z96652 Presence of left artificial knee joint: Secondary | ICD-10-CM | POA: Insufficient documentation

## 2019-04-07 NOTE — Telephone Encounter (Signed)
Pt called said she had her knee replacement by dr Aline Brochure. He had put her on potassium 20 mg 3x a day  Dr Moshe Cipro had her on 10 mg 2x a day. She is finished with Dr Althia Forts potassium and wanted to know should she continue on with Dr Camillia Herter the way she had it written she felt like dr Aline Brochure was giving her too much potassium

## 2019-04-07 NOTE — Telephone Encounter (Signed)
Pls advise her to take as I directed twice daily , and order repeat chem 7 andeGFR to check potassium the 2nd week in January

## 2019-04-07 NOTE — Telephone Encounter (Signed)
Please advise 

## 2019-04-07 NOTE — Telephone Encounter (Signed)
Patient advised to take Potassium twice daily as directed and to have lab drawn second week in January with verbal understanding

## 2019-04-08 ENCOUNTER — Other Ambulatory Visit: Payer: Self-pay

## 2019-04-08 ENCOUNTER — Other Ambulatory Visit: Payer: Self-pay | Admitting: Orthopedic Surgery

## 2019-04-08 ENCOUNTER — Ambulatory Visit (INDEPENDENT_AMBULATORY_CARE_PROVIDER_SITE_OTHER): Payer: Federal, State, Local not specified - PPO | Admitting: Orthopedic Surgery

## 2019-04-08 ENCOUNTER — Encounter: Payer: Self-pay | Admitting: Orthopedic Surgery

## 2019-04-08 DIAGNOSIS — Z96652 Presence of left artificial knee joint: Secondary | ICD-10-CM

## 2019-04-08 NOTE — Progress Notes (Signed)
Chief Complaint  Patient presents with  . Routine Post Op    left knee replacement 03/24/19   Dhalia is here for postop day visit #1 she had her left total knee December 1 she was discharged on December 3  She is doing pretty well considering she had to have a manipulation after her last knee replacement she has 90 degrees of passive flexion she is ambulating with a cane  She is only on tramadol for pain  She does complain of some a.m. nausea and she did have a UTI went to urgent care and was placed on antibiotics  Symptoms of UTI seem to be controlled  Staples were extracted her wound looks good her calf is supple  She is on aspirin twice a day for DVT prophylaxis 81 mg at a time and TED hose which were removed today  Recommend 2 more weeks of in-house physical therapy due to her mobility overall  She will continue with Robaxin for some muscle spasms that she is having she will continue with Ultram and Neurontin for pain along with Celebrex  Follow-up in 5 weeks

## 2019-04-15 ENCOUNTER — Other Ambulatory Visit: Payer: Self-pay | Admitting: Orthopedic Surgery

## 2019-04-22 ENCOUNTER — Other Ambulatory Visit: Payer: Self-pay | Admitting: Family Medicine

## 2019-04-22 ENCOUNTER — Other Ambulatory Visit: Payer: Self-pay

## 2019-04-22 ENCOUNTER — Inpatient Hospital Stay (HOSPITAL_COMMUNITY): Payer: Federal, State, Local not specified - PPO | Attending: Hematology

## 2019-04-22 ENCOUNTER — Telehealth: Payer: Self-pay | Admitting: Family Medicine

## 2019-04-22 ENCOUNTER — Other Ambulatory Visit: Payer: Self-pay | Admitting: Orthopedic Surgery

## 2019-04-22 ENCOUNTER — Ambulatory Visit (HOSPITAL_COMMUNITY): Payer: Federal, State, Local not specified - PPO

## 2019-04-22 VITALS — BP 139/66 | HR 93 | Temp 97.5°F | Resp 18

## 2019-04-22 DIAGNOSIS — Z9884 Bariatric surgery status: Secondary | ICD-10-CM | POA: Diagnosis not present

## 2019-04-22 DIAGNOSIS — K909 Intestinal malabsorption, unspecified: Secondary | ICD-10-CM | POA: Insufficient documentation

## 2019-04-22 DIAGNOSIS — E538 Deficiency of other specified B group vitamins: Secondary | ICD-10-CM | POA: Insufficient documentation

## 2019-04-22 MED ORDER — CYANOCOBALAMIN 1000 MCG/ML IJ SOLN
1000.0000 ug | Freq: Once | INTRAMUSCULAR | Status: AC
  Administered 2019-04-22: 13:00:00 1000 ug via INTRAMUSCULAR

## 2019-04-22 NOTE — Telephone Encounter (Signed)
Pls contact dr harrison's nurse and ask her to bring this note from on all provider re tramadol, gabapentin and robaxin all 3 he prescribed followinbg recent surgery and there is a concern of withdrawal, he may decide to taper  the meds  but needs his attention  Also I recommend cardiology eval for palpitations, please refer if she agrees to Cardiologist of her choice

## 2019-04-22 NOTE — Telephone Encounter (Signed)
**  After Hours/ Emergency Line Call**  Received a call to report that Sheri Jones has been having intermittent episodes of her heart beating fast and feeling warm since Monday evening.  Last night she stated this got worse and became persistent resulting in not sleeping well.  She noticed some looser stools this morning but denies any vomiting.  She denies any chest pain, shortness of breath.  She had a left total knee replacement on 03/24/2019 and believes her symptoms are due to withdrawal from stopping as needed medications tramadol 50 mg, gabapentin 300 mg, Robaxin 500 mg, and nitrofurantoin for UTI.  She reports her surgical recovery has been uneventful otherwise.  Denies redness, swelling, pain of left knee.  She continues to work with physical therapy.  Has follow-up with Ortho on 05/13/2019.  Advised patient that symptoms are not likely due to discontinuation of above medications given short duration but certainly possible, recommended she be evaluated by her PCP.  No current symptoms to suggest need for inpatient or ED management. She has nursing appointment today for a B12 shot at [facility].  Advised patient to call PCP office once open to schedule appointment for in person evaluation with provider.  Certainly if her symptoms were to worsen she develops chest pain or shortness of breath, recommended she present to ED for emergent evaluation.  Patient verbalized understanding.  Will forward to PCP.  Rory Percy, DO PGY-3, [facility] 04/22/2019 8:02 AM

## 2019-04-22 NOTE — Telephone Encounter (Signed)
Patient called with palpatations and feeling warm, unsure if fever. Loose stools also. Advised urgent care/ER since on call dr said symptoms were likely not due to withdrawals given the short duration of use. Pt understands and will followup after eval

## 2019-04-29 ENCOUNTER — Encounter: Payer: Self-pay | Admitting: Family Medicine

## 2019-04-29 ENCOUNTER — Other Ambulatory Visit: Payer: Self-pay

## 2019-04-29 ENCOUNTER — Ambulatory Visit (INDEPENDENT_AMBULATORY_CARE_PROVIDER_SITE_OTHER): Payer: Federal, State, Local not specified - PPO | Admitting: Family Medicine

## 2019-04-29 VITALS — BP 122/78 | HR 90 | Temp 97.8°F | Resp 15 | Ht 63.0 in | Wt 201.0 lb

## 2019-04-29 DIAGNOSIS — I10 Essential (primary) hypertension: Secondary | ICD-10-CM

## 2019-04-29 DIAGNOSIS — F5104 Psychophysiologic insomnia: Secondary | ICD-10-CM

## 2019-04-29 DIAGNOSIS — R002 Palpitations: Secondary | ICD-10-CM

## 2019-04-29 DIAGNOSIS — K219 Gastro-esophageal reflux disease without esophagitis: Secondary | ICD-10-CM

## 2019-04-29 MED ORDER — TEMAZEPAM 7.5 MG PO CAPS: 7.5000 mg | ORAL_CAPSULE | Freq: Every evening | ORAL | 0 refills | Status: DC | PRN

## 2019-04-29 NOTE — Patient Instructions (Addendum)
F/U phone visit with MD in 6 weeks, call if you need me before  I believe that you are having mild anxiety and panic following the surgery which is successful.  Please start bedtime Restoril as needed, and stop all caffeine. No light or sound  during sleep  Ensure adequate fluid intake  Careful not to fall   EKG today shows normal rhythm regular  Rate and not fast despite the fact that you say that it feels as if beatings fast  Continue physical hterapy at home so that your recuperation will continue in the right direction

## 2019-04-29 NOTE — Progress Notes (Signed)
   Sheri Jones     MRN: [id number]      DOB: [birth year]   HPI Sheri Jones is here for follow upof urgent care visit 1 week ago dx with dehydration, also went in December and was dx with UTI Had left knee replacement Dec1, and states shejust completed put patient rehab, overall did well, but still feels impatient as though needs to improve faster. Notes episodes of anxiety, esp at night, and experiences ROS Denies recent fever or chills. Denies sinus pressure, nasal congestion, ear pain or sore throat. Denies chest congestion, productive cough or wheezing. Denies chest pains, PND, orthopnea and leg swelling Denies abdominal pain, nausea, vomiting,diarrhea or constipation.   Denies dysuria, frequency, hesitancy or incontinence. Denies joint pain, swelling and limitation in mobility. Denies headaches, seizures, numbness, or tingling. Denies depression, c/o  anxiety and mild  insomnia. Denies skin break down or rash.   PE  BP 122/78   Pulse 90   Temp 97.8 F (36.6 C) (Temporal)   Resp 15   Ht 5\' 3"  (1.6 m)   Wt 201 lb (91.2 kg)   SpO2 97%   BMI 35.61 kg/m   Patient alert and oriented and in no cardiopulmonary distress.  HEENT: No facial asymmetry, EOMI,     Neck supple .  Chest: Clear to auscultation bilaterally.  CVS: S1, S2 no murmurs, no S3.Regular rate.  ABD: Soft non tender.   Ext: No edema  MS: Adequate ROM spine, shoulders, hips and knees.  Skin: Intact, no ulcerations or rash noted.  Psych: Good eye contact, normal affect. Memory intact not anxious or depressed appearing.  CNS: CN 2-12 intact, power,  normal throughout.no focal deficits noted.   Assessment & Plan Palpitations Experiencing sensation of heart beating fast and fluttering, evaluated in urgent care 1 weeks ago, no cardiac abn found  Today c/o p[alapitations  EKG shows regular rhythm , and normal rate. Compliant is driven by anxiety is my clinical opinion. If persist however , she is to  Call  and I wil refer her to Cardiology, advised d/c caffeine  Essential hypertension Controlled, no change in medication DASH diet and commitment to daily physical activity for a minimum of 30 minutes discussed and encouraged, as a part of hypertension management. The importance of attaining a healthy weight is also discussed.  BP/Weight 04/29/2019 04/22/2019 03/25/2019 03/24/2019 03/23/2019 03/18/2019 [id number]  Systolic BP 123XX123 [id number] 123XX123 - Q000111Q A999333 Q000111Q  Diastolic BP 78 66 84 - 75 77 78  Wt. (Lbs) 201 - - 205.03 - 205 205  BMI 35.61 - - 36.32 - 36.31 36.31       Morbid obesity (HCC) Obesity linked with hypertension and arthritis  Patient re-educated about  the importance of commitment to a  minimum of 150 minutes of exercise per week as able.  The importance of healthy food choices with portion control discussed, as well as eating regularly and within a 12 hour window most days. The need to choose "clean , green" food 50 to 75% of the time is discussed, as well as to make water the primary drink and set a goal of 64 ounces water daily.    Weight /BMI 04/29/2019 03/24/2019 03/18/2019  WEIGHT 201 lb 205 lb 0.4 oz 205 lb  HEIGHT 5\' 3"  5\' 3"  5\' 3"   BMI 35.61 kg/m2 36.32 kg/m2 36.31 kg/m2      Insomnia Sleep hygiene reviewed and written information offered also. Prescription sent for  medication needed.

## 2019-05-03 ENCOUNTER — Encounter: Payer: Self-pay | Admitting: Family Medicine

## 2019-05-03 DIAGNOSIS — K219 Gastro-esophageal reflux disease without esophagitis: Secondary | ICD-10-CM | POA: Insufficient documentation

## 2019-05-03 DIAGNOSIS — G4700 Insomnia, unspecified: Secondary | ICD-10-CM | POA: Insufficient documentation

## 2019-05-03 NOTE — Assessment & Plan Note (Signed)
Experiencing sensation of heart beating fast and fluttering, evaluated in urgent care 1 weeks ago, no cardiac abn found  Today c/o p[alapitations  EKG shows regular rhythm , and normal rate. Compliant is driven by anxiety is my clinical opinion. If persist however , she is to  Call and I wil refer her to Cardiology, advised d/c caffeine

## 2019-05-03 NOTE — Assessment & Plan Note (Signed)
Controlled, no change in medication DASH diet and commitment to daily physical activity for a minimum of 30 minutes discussed and encouraged, as a part of hypertension management. The importance of attaining a healthy weight is also discussed.  BP/Weight 04/29/2019 04/22/2019 03/25/2019 03/24/2019 03/23/2019 03/18/2019 [id number]  Systolic BP 123XX123 [id number] 123XX123 - Q000111Q A999333 Q000111Q  Diastolic BP 78 66 84 - 75 77 78  Wt. (Lbs) 201 - - 205.03 - 205 205  BMI 35.61 - - 36.32 - 36.31 36.31

## 2019-05-03 NOTE — Assessment & Plan Note (Signed)
Obesity linked with hypertension and arthritis  Patient re-educated about  the importance of commitment to a  minimum of 150 minutes of exercise per week as able.  The importance of healthy food choices with portion control discussed, as well as eating regularly and within a 12 hour window most days. The need to choose "clean , green" food 50 to 75% of the time is discussed, as well as to make water the primary drink and set a goal of 64 ounces water daily.    Weight /BMI 04/29/2019 03/24/2019 03/18/2019  WEIGHT 201 lb 205 lb 0.4 oz 205 lb  HEIGHT 5\' 3"  5\' 3"  5\' 3"   BMI 35.61 kg/m2 36.32 kg/m2 36.31 kg/m2

## 2019-05-03 NOTE — Assessment & Plan Note (Signed)
Sleep hygiene reviewed and written information offered also. Prescription sent for  medication needed.  

## 2019-05-13 ENCOUNTER — Encounter: Payer: Self-pay | Admitting: Orthopedic Surgery

## 2019-05-13 ENCOUNTER — Other Ambulatory Visit: Payer: Self-pay

## 2019-05-13 ENCOUNTER — Ambulatory Visit (INDEPENDENT_AMBULATORY_CARE_PROVIDER_SITE_OTHER): Payer: Federal, State, Local not specified - PPO | Admitting: Orthopedic Surgery

## 2019-05-13 VITALS — BP 152/75 | HR 113 | Wt 192.0 lb

## 2019-05-13 DIAGNOSIS — Z96652 Presence of left artificial knee joint: Secondary | ICD-10-CM

## 2019-05-13 NOTE — Progress Notes (Signed)
Chief Complaint  Patient presents with  . Routine Post Op    03/24/19 left knee replacement     74 year old female had a left total knee has been without any therapy for the last 3 weeks comes in today with a stiff knee lack of 90 degrees of flexion flexion contracture.  She has a history of prior need of manipulation on the right knee  As I discussed with her this does not look promising we will give it 1 try with physical therapy for 6weeks.  Poor prognosis  Currently not having any pain in the knee denied any need of any pain medication  See her in 6 weeks Encounter Diagnosis  Name Primary?  . 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

## 2019-05-13 NOTE — Patient Instructions (Signed)
Start outpatient therapy

## 2019-05-14 ENCOUNTER — Other Ambulatory Visit: Payer: Self-pay | Admitting: Family Medicine

## 2019-05-14 LAB — BASIC METABOLIC PANEL WITHOUT GFR
BUN: 12 mg/dL (ref 7–25)
CO2: 27 mmol/L (ref 20–32)
Calcium: 9.6 mg/dL (ref 8.6–10.4)
Chloride: 102 mmol/L (ref 98–110)
Creat: 0.89 mg/dL (ref 0.60–0.93)
GFR, Est African American: 76 mL/min/{1.73_m2}
GFR, Est Non African American: 65 mL/min/{1.73_m2}
Glucose, Bld: 81 mg/dL (ref 65–139)
Potassium: 3.4 mmol/L — ABNORMAL LOW (ref 3.5–5.3)
Sodium: 141 mmol/L (ref 135–146)

## 2019-05-14 MED ORDER — POTASSIUM CHLORIDE ER 10 MEQ PO TBCR: 10.0000 meq | EXTENDED_RELEASE_TABLET | Freq: Every day | ORAL | 1 refills | Status: DC

## 2019-05-18 ENCOUNTER — Other Ambulatory Visit: Payer: Self-pay | Admitting: Orthopedic Surgery

## 2019-05-18 MED ORDER — TRAMADOL HCL 50 MG PO TABS: 50.0000 mg | ORAL_TABLET | Freq: Four times a day (QID) | ORAL | 2 refills | Status: DC

## 2019-05-18 NOTE — Telephone Encounter (Signed)
Sheri Jones called stating she needed something for pain since she is starting PT.  She states she "doesn't want something real strong though."  She uses [facility] in [place]

## 2019-05-19 ENCOUNTER — Ambulatory Visit: Payer: Federal, State, Local not specified - PPO | Attending: Orthopedic Surgery | Admitting: Physical Therapy

## 2019-05-19 ENCOUNTER — Encounter: Payer: Self-pay | Admitting: Physical Therapy

## 2019-05-19 ENCOUNTER — Other Ambulatory Visit: Payer: Self-pay

## 2019-05-19 DIAGNOSIS — M25562 Pain in left knee: Secondary | ICD-10-CM | POA: Insufficient documentation

## 2019-05-19 DIAGNOSIS — R600 Localized edema: Secondary | ICD-10-CM | POA: Diagnosis present

## 2019-05-19 DIAGNOSIS — M25662 Stiffness of left knee, not elsewhere classified: Secondary | ICD-10-CM | POA: Insufficient documentation

## 2019-05-19 DIAGNOSIS — R262 Difficulty in walking, not elsewhere classified: Secondary | ICD-10-CM

## 2019-05-19 NOTE — Therapy (Signed)
[place], [place], [address] Phone: [phone]   Fax:  [phone]  Physical Therapy Evaluation  Patient Details  Name: Sheri Jones MRN: [id number] Date of Birth: [birth year] Referring Provider (PT): Dr. [name]    Encounter Date: 05/19/2019  PT End of Session - 05/19/19 1056    Visit Number  1    Number of Visits  16    Date for PT Re-Evaluation  07/14/19    PT Start Time  1050    PT Stop Time  1130    PT Time Calculation (min)  40 min    Activity Tolerance  Patient tolerated treatment well    Behavior During Therapy  [facility] for tasks assessed/performed       Past Medical History:  Diagnosis Date  . Allergic rhinitis due to pollen   . Anemia   . Diabetes mellitus without complication ([place])   . 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 Obesity, unspecified   . Osteoarthrosis, unspecified whether generalized or localized, lower leg   . Unspecified essential hypertension   . Vitamin B12 deficiency disease 10/28/2008   Qualifier: Diagnosis of  By: Moshe Cipro MD, Joycelyn Schmid      Past Surgical History:  Procedure Laterality Date  . COLONOSCOPY  2008  . EXAM UNDER ANESTHESIA WITH MANIPULATION OF KNEE Right 05/11/2015   Procedure: EXAM UNDER ANESTHESIA WITH MANIPULATION OF KNEE;  Surgeon: Carole Civil, MD;  Location: [facility];  Service: Orthopedics;  Laterality: Right;  . EYE SURGERY Left 04/27/2018   cataract  . EYE SURGERY Right 06/17/2018   cataract  . GASTRIC BYPASS  2004  . TOTAL KNEE ARTHROPLASTY Right 02/23/2015   Procedure: RIGHT TOTAL KNEE ARTHROPLASTY;  Surgeon: Carole Civil, MD;  Location: [facility];  Service: Orthopedics;  Laterality: Right;  . TOTAL KNEE ARTHROPLASTY Left 03/24/2019   Procedure: TOTAL KNEE ARTHROPLASTY;  Surgeon: Carole Civil, MD;  Location: [facility];  Service: Orthopedics;  Laterality: Left;    There were no vitals filed for this  visit.   Subjective Assessment - 05/19/19 1057    Subjective  Pt underwent L TKA on 03/24/19.  She has difficulty bending her knee for transfers.  She had HHPT for several visits but did have an interruption due to UTI.  She has good support at home and does her HEP.    Pertinent History  UTI    Limitations  Sitting;Lifting    How long can you stand comfortably?  20-30 min    How long can you walk comfortably?  has to rest about 30 min    Patient Stated Goals  Pt would like to be able to walk good, visit her sister.    Currently in Pain?  No/denies         [facility] PT Assessment - 05/19/19 0001      Assessment   Medical Diagnosis  L TKA     Referring Provider (PT)  Dr. [name]     Onset Date/Surgical Date  03/24/19    Next MD Visit  4 weeks     Prior Therapy  Yes       Precautions   Precautions  None      Restrictions   Weight Bearing Restrictions  No      Balance Screen   Has the patient fallen in the past 6 months  No      [place]  residence    [place] Access  Other (comment)    Home Layout  One level    [place] - 2 wheels;[place] - single point    Additional Comments  has close friends, sister nearby, other family       Prior Function   Level of Independence  Independent with basic ADLs;Independent with household mobility without device;Independent with community mobility with device    Vocation  Retired    Leisure  reading, family, friends, church       Cognition   Overall Cognitive Status  Within Functional Limits for tasks assessed      Observation/Other Assessments   Focus on Therapeutic Outcomes (FOTO)   49%      Observation/Other Assessments-Edema    Edema  Circumferential      Circumferential Edema   Circumferential - Right  16 1/2 inch     Circumferential - Left   17 1/4 inch       Sensation   Light Touch  Appears Intact       Posture/Postural Control   Posture/Postural Control  Postural limitations    Postural Limitations  Rounded Shoulders;Forward head    Posture Comments  genu valgus L knee flexed in stance, trunk flexion       AROM   Right Knee Extension  0    Right Knee Flexion  85    Left Knee Extension  -25    Left Knee Flexion  72      Strength   Right Hip Flexion  4/5    Left Hip Flexion  3-/5    Right Knee Flexion  5/5    Right Knee Extension  5/5    Left Knee Flexion  4+/5    Left Knee Extension  4+/5      Palpation   Patella mobility  hypomobility     Palpation comment  tender lateral L knee ,        Transfers   Comments  uses momentum to stand without hands x 1 , unable to control descent , feet kick out       Ambulation/Gait   Ambulation Distance (Feet)  150 Feet    Assistive device  Straight cane    Gait Pattern  Decreased stance time - left;Decreased hip/knee flexion - right;Decreased hip/knee flexion - left;Left flexed knee in stance;Antalgic       Objective measurements completed on examination: See above findings.     PT Education - 05/19/19 1300    Education Details  PT/POC, Has HEP from HHPT, ROM goals, gait with cane    Person(s) Educated  Patient    Methods  Explanation;Demonstration    Comprehension  Verbalized understanding;Need further instruction       PT Short Term Goals - 05/19/19 1307      PT SHORT TERM GOAL #1   Title  Pt will be independent with initial HEP    Time  3    Period  Weeks    Status  New    Target Date  06/09/19      PT SHORT TERM GOAL #2   Title  Pt will be able to walk without cane in her household with improved confidence in balance and ability    Time  3    Period  Weeks    Status  New    Target Date  06/09/19  PT SHORT TERM GOAL #3   Title  Pt will be able to complete light activities in her home with min difficulty    Baseline  mod    Time  3    Period  Weeks    Status  New    Target Date  06/09/19      PT SHORT TERM  GOAL #4   Title  Pt can show controlled descent to chair with min cueing    Time  3    Period  Weeks    Status  New    Target Date  06/09/19        PT Long Term Goals - 05/19/19 1309      PT LONG TERM GOAL #1   Title  Pt will increase right knee AROM to 10-90 deg    Time  8    Period  Weeks    Status  New    Target Date  07/17/19      PT LONG TERM GOAL #2   Title  FOTO will improve to 33% limited or better at the end of her episode of PT.    Baseline  49%    Time  8    Period  Weeks    Status  New    Target Date  07/17/19      PT LONG TERM GOAL #3   Title  Pt will be able to walk 1000 feet with or without cane, min deviation and no increase in pain in L knee.    Baseline  lacks L knee motion, poor upper trunk posture, pain increases with distance and time > 10 min    Time  8    Period  Weeks    Status  New    Target Date  07/17/19      PT LONG TERM GOAL #4   Title  Pt will be able to manage her home tasks with no difficulty    Baseline  moderate difficulty for light tasks    Time  8    Period  Weeks    Status  New    Target Date  07/17/19             Plan - 05/19/19 1302    Clinical Impression Statement  Pt presents for mod complexity eval of L knee s/p total joint replacement done about 7 weeks ago.  She has significant limitations in AROM, flexibility in bilateral knees.  She has min edema but a well healed scar. L hip weak and has abnormal alignment in LEs.  L knee stiffness reduces leg length by 1 inch or more.  She lives alone but has a devent support system.  Admits to a low tolerance for pain.  Stiffness in Rt knee hinders mobility as well, expect a slower than normal course for recovery for this reason.    Personal Factors and Comorbidities  Age;Comorbidity 1;Past/Current Experience;Social Background    Comorbidities  Rt TKA, HTN    Examination-Activity Limitations  Bathing;Squat;Lift;Stairs;Stand;Locomotion Level;Bend;Transfers;Carry;Sit;Sleep     Examination-Participation Restrictions  Church;Shop;Cleaning;Meal Prep;Community Activity;Interpersonal Relationship    Stability/Clinical Decision Making  Evolving/Moderate complexity    Clinical Decision Making  Moderate    Rehab Potential  Good    PT Frequency  2x / week    PT Duration  8 weeks    PT Treatment/Interventions  ADLs/Self Care Home Management;Electrical Stimulation;Therapeutic activities;Patient/family education;Taping;Therapeutic exercise;Cryotherapy;Moist Heat;Gait training;Stair training;Functional mobility training;Neuromuscular re-education;Manual techniques;Passive range of motion;Vasopneumatic Device  PT Next Visit Plan  see if she brought in her HEP , ROM, NUstep, manual    PT Home Exercise Plan  HHPT    Consulted and Agree with Plan of Care  Patient       Patient will benefit from skilled therapeutic intervention in order to improve the following deficits and impairments:  Abnormal gait, Increased fascial restricitons, Pain, Postural dysfunction, Decreased scar mobility, Decreased mobility, Decreased activity tolerance, Decreased endurance, Decreased range of motion, Decreased strength, Hypomobility, Impaired flexibility, Difficulty walking, Decreased balance, Increased edema  Visit Diagnosis: Stiffness of left knee, not elsewhere classified  Difficulty in walking, not elsewhere classified  Acute pain of left knee  Localized edema     Problem List Patient Active Problem List   Diagnosis Date Noted  . Insomnia 05/03/2019  . GERD (gastroesophageal reflux disease) 05/03/2019  . Palpitations 04/29/2019  . 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 S/P total knee arthroplasty, 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  . Primary osteoarthritis of left knee   . 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 Osteopenia 08/12/2013  . 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 Prediabetes 08/07/2011  . Vitamin D deficiency 08/07/2011  . Chronic pain  of both knees 01/30/2011  . PERSONAL HX COLONIC POLYPS 07/05/2010  . KNEE, ARTHRITIS, DEGEN./OSTEO 01/16/2010  . Vitamin B12 deficiency disease 10/28/2008  . 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 Morbid obesity ([place]) 08/15/2007  . Essential hypertension 08/15/2007    Sheri Jones 05/19/2019, 1:59 PM  [facility] [address] [place], [place], [address] Phone: [phone]   Fax:  [phone]  Name: Sheri Jones MRN: [id number] Date of Birth: 1945-11-08   Raeford Razor, PT 05/19/19 2:00 PM Phone: [phone] Fax: [phone]

## 2019-05-22 ENCOUNTER — Other Ambulatory Visit: Payer: Self-pay

## 2019-05-22 ENCOUNTER — Inpatient Hospital Stay (HOSPITAL_COMMUNITY): Payer: Federal, State, Local not specified - PPO | Attending: Hematology

## 2019-05-22 ENCOUNTER — Encounter (HOSPITAL_COMMUNITY): Payer: Self-pay

## 2019-05-22 VITALS — BP 141/87 | HR 90 | Temp 97.5°F | Resp 18

## 2019-05-22 DIAGNOSIS — E538 Deficiency of other specified B group vitamins: Secondary | ICD-10-CM | POA: Insufficient documentation

## 2019-05-22 MED ORDER — CYANOCOBALAMIN 1000 MCG/ML IJ SOLN
1000.0000 ug | Freq: Once | INTRAMUSCULAR | Status: AC
  Administered 2019-05-22: 12:00:00 1000 ug via INTRAMUSCULAR

## 2019-05-26 ENCOUNTER — Other Ambulatory Visit: Payer: Self-pay

## 2019-05-26 ENCOUNTER — Ambulatory Visit: Payer: Federal, State, Local not specified - PPO | Attending: Orthopedic Surgery | Admitting: Physical Therapy

## 2019-05-26 DIAGNOSIS — R262 Difficulty in walking, not elsewhere classified: Secondary | ICD-10-CM | POA: Diagnosis present

## 2019-05-26 DIAGNOSIS — M25662 Stiffness of left knee, not elsewhere classified: Secondary | ICD-10-CM | POA: Diagnosis not present

## 2019-05-26 DIAGNOSIS — R600 Localized edema: Secondary | ICD-10-CM

## 2019-05-26 DIAGNOSIS — M25562 Pain in left knee: Secondary | ICD-10-CM

## 2019-05-26 NOTE — Therapy (Signed)
Sheri Jones, [place], [address] Phone: [phone]   Fax:  [phone]  Physical Therapy Treatment  Patient Details  Name: Sheri Jones MRN: [id number] Date of Birth: 02/13/1946 Referring Provider (PT): Dr. [name]    Encounter Date: 05/26/2019  PT End of Session - 05/26/19 1407    Visit Number  2    Number of Visits  16    Date for PT Re-Evaluation  07/14/19    PT Start Time  [id number]    PT Stop Time  1405    PT Time Calculation (min)  43 min       Past Medical History:  Diagnosis Date  . Allergic rhinitis due to pollen   . Anemia   . Diabetes mellitus without complication ([place])   . 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 Obesity, unspecified   . Osteoarthrosis, unspecified whether generalized or localized, lower leg   . Unspecified essential hypertension   . Vitamin B12 deficiency disease 10/28/2008   Qualifier: Diagnosis of  By: Moshe Cipro MD, Joycelyn Schmid      Past Surgical History:  Procedure Laterality Date  . COLONOSCOPY  2008  . EXAM UNDER ANESTHESIA WITH MANIPULATION OF KNEE Right 05/11/2015   Procedure: EXAM UNDER ANESTHESIA WITH MANIPULATION OF KNEE;  Surgeon: Carole Civil, MD;  Location: [facility];  Service: Orthopedics;  Laterality: Right;  . EYE SURGERY Left 04/27/2018   cataract  . EYE SURGERY Right 06/17/2018   cataract  . GASTRIC BYPASS  2004  . TOTAL KNEE ARTHROPLASTY Right 02/23/2015   Procedure: RIGHT TOTAL KNEE ARTHROPLASTY;  Surgeon: Carole Civil, MD;  Location: [facility];  Service: Orthopedics;  Laterality: Right;  . TOTAL KNEE ARTHROPLASTY Left 03/24/2019   Procedure: TOTAL KNEE ARTHROPLASTY;  Surgeon: Carole Civil, MD;  Location: [facility];  Service: Orthopedics;  Laterality: Left;    There were no vitals filed for this visit.                    [facility] PT Treatment/Exercise - 05/26/19 0001      Knee/Hip Exercises: Stretches   Active Hamstring Stretch  3 reps    Active Hamstring Stretch Limitations  supine with strap and seated EOM     Knee: Self-Stretch to increase Flexion  Left;5 reps    Knee: Self-Stretch Limitations  seated scoot stretch      Knee/Hip Exercises: Aerobic   Nustep  7 minutes L3 UE/LE       Knee/Hip Exercises: Seated   Long Arc Quad  20 reps    Long Arc Quad Limitations  yellow band       Knee/Hip Exercises: Supine   Quad Sets  20 reps    Quad Sets Limitations  seatd and supine , tactile cues, lacks 15-20 degrees     Heel Slides  20 reps    Heel Slides Limitations  strap     Straight Leg Raises  10 reps;2 sets      Knee/Hip Exercises: Sidelying   Clams  x10       Manual Therapy   Manual therapy comments  patella mobs                PT Short Term Goals - 05/19/19 1307      PT SHORT TERM GOAL #1   Title  Pt will be independent with initial HEP    Time  3    Period  Weeks  Status  New    Target Date  06/09/19      PT SHORT TERM GOAL #2   Title  Pt will be able to walk without cane in her household with improved confidence in balance and ability    Time  3    Period  Weeks    Status  New    Target Date  06/09/19      PT SHORT TERM GOAL #3   Title  Pt will be able to complete light activities in her home with min difficulty    Baseline  mod    Time  3    Period  Weeks    Status  New    Target Date  06/09/19      PT SHORT TERM GOAL #4   Title  Pt can show controlled descent to chair with min cueing    Time  3    Period  Weeks    Status  New    Target Date  06/09/19        PT Long Term Goals - 05/19/19 1309      PT LONG TERM GOAL #1   Title  Pt will increase right knee AROM to 10-90 deg    Time  8    Period  Weeks    Status  New    Target Date  07/17/19      PT LONG TERM GOAL #2   Title  FOTO will improve to 33% limited or better at the end of her episode of PT.    Baseline  49%    Time  8    Period  Weeks    Status  New    Target Date   07/17/19      PT LONG TERM GOAL #3   Title  Pt will be able to walk 1000 feet with or without cane, min deviation and no increase in pain in L knee.    Baseline  lacks L knee motion, poor upper trunk posture, pain increases with distance and time > 10 min    Time  8    Period  Weeks    Status  New    Target Date  07/17/19      PT LONG TERM GOAL #4   Title  Pt will be able to manage her home tasks with no difficulty    Baseline  moderate difficulty for light tasks    Time  8    Period  Weeks    Status  New    Target Date  07/17/19            Plan - 05/26/19 1402    Clinical Impression Statement  Pt arrives with improved knee ROM to 85 flexion and lacks 15 degress knee extension. Added heel slide with strap and LAQ with yellow band. She was able to activate Quad with tactile cues. Unable to lift hip into abduction from side lying.    PT Next Visit Plan  ROM, NUstep, manual, begain clams , latera hip strength    PT Home Exercise Plan  HHPT (seated calf stretch with towel, Seated HS stretch with towel, march, kne flexion stretch on step, knee extension stretch on step, squata, standing hip abduction and extension), 05/26/2019- added LAQ with yello band and heel slide with strap       Patient will benefit from skilled therapeutic intervention in order to improve the following deficits and impairments:  Abnormal gait, Increased fascial restricitons,  Pain, Postural dysfunction, Decreased scar mobility, Decreased mobility, Decreased activity tolerance, Decreased endurance, Decreased range of motion, Decreased strength, Hypomobility, Impaired flexibility, Difficulty walking, Decreased balance, Increased edema  Visit Diagnosis: Stiffness of left knee, not elsewhere classified  Difficulty in walking, not elsewhere classified  Acute pain of left knee  Localized edema     Problem List Patient Active Problem List   Diagnosis Date Noted  . Insomnia 05/03/2019  . GERD  (gastroesophageal reflux disease) 05/03/2019  . Palpitations 04/29/2019  . 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 S/P total knee arthroplasty, 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  . Primary osteoarthritis of left knee   . 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 Osteopenia 08/12/2013  . 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 Prediabetes 08/07/2011  . Vitamin D deficiency 08/07/2011  . Chronic pain of both knees 01/30/2011  . PERSONAL HX COLONIC POLYPS 07/05/2010  . KNEE, ARTHRITIS, DEGEN./OSTEO 01/16/2010  . Vitamin B12 deficiency disease 10/28/2008  . 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 Morbid obesity ([place]) 08/15/2007  . Essential hypertension 08/15/2007    Dorene Ar, PTA 05/26/2019, 2:12 PM  [facility] [address] [place], [place], [address] Phone: [phone]   Fax:  [phone]  Name: CECILEE MECHLING MRN: [id number] Date of Birth: 29-Aug-1945

## 2019-05-26 NOTE — Patient Instructions (Signed)
Knee Extension: Resisted (Sitting)   With band looped around right ankle and under other foot, straighten leg with ankle loop. Keep other leg bent to increase resistance. Repeat _10___ times per set. Do __2__ sets per session. Do 2____ sessions per day. Heel Slide    Bend knee and pull heel toward buttocks. Hold _5___ seconds. USE BELT TO PULL>  Return.  Repeat __10__ times. Do ___2_ sessions per day.  http://gt2.exer.us/372   Copyright  VHI. All rights reserved.

## 2019-05-28 ENCOUNTER — Ambulatory Visit: Payer: Federal, State, Local not specified - PPO | Admitting: Physical Therapy

## 2019-05-28 ENCOUNTER — Other Ambulatory Visit: Payer: Self-pay

## 2019-05-28 DIAGNOSIS — M25562 Pain in left knee: Secondary | ICD-10-CM

## 2019-05-28 DIAGNOSIS — M25662 Stiffness of left knee, not elsewhere classified: Secondary | ICD-10-CM | POA: Diagnosis not present

## 2019-05-28 DIAGNOSIS — R600 Localized edema: Secondary | ICD-10-CM

## 2019-05-28 DIAGNOSIS — R262 Difficulty in walking, not elsewhere classified: Secondary | ICD-10-CM

## 2019-05-28 NOTE — Patient Instructions (Signed)
ABDUCTION: Standing (Active)    Stand, feet flat. Lift right leg out to side.  Complete __2_ sets of _10__ repetitions. Perform _1-2__ sessions per day.  http://gtsc.exer.us/111   Copyright  VHI. All rights reserved.  Abduction: Clam (Eccentric) - Side-Lying    Lie on side with knees bent. Lift top knee, keeping feet together. Keep trunk steady. Slowly lower for 3-5 seconds. _10__ reps per set, ___2 sets per day, _5__ days per week.   http://ecce.exer.us/65   Copyright  VHI. All rights reserved.

## 2019-05-28 NOTE — Therapy (Addendum)
[place], [place], [address] Phone: [phone]   Fax:  [phone]  Physical Therapy Treatment  Patient Details  Name: Sheri Jones MRN: [id number] Date of Birth: [birth year] Referring Provider (PT): Dr. [name]    Encounter Date: 05/28/2019  PT End of Session - 05/28/19 1337    Visit Number  3    Number of Visits  16    Date for PT Re-Evaluation  07/14/19    PT Start Time  [id number]    PT Stop Time  1410    PT Time Calculation (min)  52 min    Activity Tolerance  Patient tolerated treatment well    Behavior During Therapy  [facility] for tasks assessed/performed       Past Medical History:  Diagnosis Date  . Allergic rhinitis due to pollen   . Anemia   . Diabetes mellitus without complication ([place])   . 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 Obesity, unspecified   . Osteoarthrosis, unspecified whether generalized or localized, lower leg   . Unspecified essential hypertension   . Vitamin B12 deficiency disease 10/28/2008   Qualifier: Diagnosis of  By: Moshe Cipro MD, Joycelyn Schmid      Past Surgical History:  Procedure Laterality Date  . COLONOSCOPY  2008  . EXAM UNDER ANESTHESIA WITH MANIPULATION OF KNEE Right 05/11/2015   Procedure: EXAM UNDER ANESTHESIA WITH MANIPULATION OF KNEE;  Surgeon: Carole Civil, MD;  Location: [facility];  Service: Orthopedics;  Laterality: Right;  . EYE SURGERY Left 04/27/2018   cataract  . EYE SURGERY Right 06/17/2018   cataract  . GASTRIC BYPASS  2004  . TOTAL KNEE ARTHROPLASTY Right 02/23/2015   Procedure: RIGHT TOTAL KNEE ARTHROPLASTY;  Surgeon: Carole Civil, MD;  Location: [facility];  Service: Orthopedics;  Laterality: Right;  . TOTAL KNEE ARTHROPLASTY Left 03/24/2019   Procedure: TOTAL KNEE ARTHROPLASTY;  Surgeon: Carole Civil, MD;  Location: [facility];  Service: Orthopedics;  Laterality: Left;    There were no vitals filed for this visit.  Subjective  Assessment - 05/28/19 1335    Subjective  Patient doing OK.  Trying to use the cane less and less.    Currently in Pain?  No/denies         [facility] Adult PT Treatment/Exercise - 05/28/19 0001      Knee/Hip Exercises: Stretches   Active Hamstring Stretch  3 reps    Active Hamstring Stretch Limitations  supine with strap and seated EOM     Knee: Self-Stretch to increase Flexion  Left;5 reps    Knee: Self-Stretch Limitations  strap and seated      Knee/Hip Exercises: Aerobic   Nustep  6 min L4 UE and LE       Knee/Hip Exercises: Standing   Hip Abduction  Stengthening;Both;1 set;15 reps;Knee straight    Abduction Limitations  lean on LLE     Other Standing Knee Exercises  knee drivers 5 lbs [id number] (mod toe touching)       Knee/Hip Exercises: Seated   Long Arc Quad  20 reps    Long Arc Quad Weight  3 lbs.    Hamstring Curl  Strengthening;Left;1 set;15 reps    Hamstring Limitations  green      Knee/Hip Exercises: Sidelying   Hip ABduction  Strengthening;Both;1 set;15 reps    Clams  x 20 rest breaks , mod cues for form       Modalities   Modalities  Cryotherapy      Cryotherapy   Number Minutes Cryotherapy  8 Minutes , left early    Cryotherapy Location  Knee    Type of Cryotherapy  Ice pack      Manual Therapy   Manual Therapy  Joint mobilization;Soft tissue mobilization;Passive ROM    Joint Mobilization  scar tissue work IASTM light     Soft tissue mobilization  anterior knee and thigh     Passive ROM  flexion supine              PT Education - 05/28/19 1336    Education Details  HEP form and technique    Person(s) Educated  Patient    Methods  Explanation;Demonstration    Comprehension  Verbalized understanding;Tactile cues required;Need further instruction       PT Short Term Goals - 05/19/19 1307      PT SHORT TERM GOAL #1   Title  Pt will be independent with initial HEP    Time  3    Period  Weeks    Status  New    Target Date  06/09/19      PT SHORT  TERM GOAL #2   Title  Pt will be able to walk without cane in her household with improved confidence in balance and ability    Time  3    Period  Weeks    Status  New    Target Date  06/09/19      PT SHORT TERM GOAL #3   Title  Pt will be able to complete light activities in her home with min difficulty    Baseline  mod    Time  3    Period  Weeks    Status  New    Target Date  06/09/19      PT SHORT TERM GOAL #4   Title  Pt can show controlled descent to chair with min cueing    Time  3    Period  Weeks    Status  New    Target Date  06/09/19        PT Long Term Goals - 05/19/19 1309      PT LONG TERM GOAL #1   Title  Pt will increase right knee AROM to 10-90 deg    Time  8    Period  Weeks    Status  New    Target Date  07/17/19      PT LONG TERM GOAL #2   Title  FOTO will improve to 33% limited or better at the end of her episode of PT.    Baseline  49%    Time  8    Period  Weeks    Status  New    Target Date  07/17/19      PT LONG TERM GOAL #3   Title  Pt will be able to walk 1000 feet with or without cane, min deviation and no increase in pain in L knee.    Baseline  lacks L knee motion, poor upper trunk posture, pain increases with distance and time > 10 min    Time  8    Period  Weeks    Status  New    Target Date  07/17/19      PT LONG TERM GOAL #4   Title  Pt will be able to manage her home tasks with no difficulty    Baseline  moderate difficulty for  light tasks    Time  8    Period  Weeks    Status  New    Target Date  07/17/19            Plan - 05/28/19 1322    Clinical Impression Statement  Pt with significant limitations in AROM of L knee, scar tissue may be contributing.  She is compliant with HEP but needs cues for certain aspects of the exercises.  She was anxious today.  Standing more effective for hip abduction but she is unable to support herself on LLE in standing without flexing her trunk.  Able to get to 85 deg with  overpressure today .    PT Treatment/Interventions  ADLs/Self Care Home Management;Electrical Stimulation;Therapeutic activities;Patient/family education;Taping;Therapeutic exercise;Cryotherapy;Moist Heat;Gait training;Stair training;Functional mobility training;Neuromuscular re-education;Manual techniques;Passive range of motion;Vasopneumatic Device    PT Next Visit Plan  ROM, NUstep, manual, begin clams , lateral hip strength standing as able,    PT Home Exercise Plan  HHPT (seated calf stretch with towel, Seated HS stretch with towel, march, knee flexion stretch on step, knee extension stretch on step, squata, standing hip abduction and extension), 05/26/2019- added LAQ with yello band and heel slide with strap    Consulted and Agree with Plan of Care  Patient       Patient will benefit from skilled therapeutic intervention in order to improve the following deficits and impairments:  Abnormal gait, Increased fascial restricitons, Pain, Postural dysfunction, Decreased scar mobility, Decreased mobility, Decreased activity tolerance, Decreased endurance, Decreased range of motion, Decreased strength, Hypomobility, Impaired flexibility, Difficulty walking, Decreased balance, Increased edema  Visit Diagnosis: Stiffness of left knee, not elsewhere classified  Difficulty in walking, not elsewhere classified  Acute pain of left knee  Localized edema     Problem List Patient Active Problem List   Diagnosis Date Noted  . Insomnia 05/03/2019  . GERD (gastroesophageal reflux disease) 05/03/2019  . Palpitations 04/29/2019  . 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 S/P total knee arthroplasty, 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  . Primary osteoarthritis of left knee   . 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 Osteopenia 08/12/2013  . 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 Prediabetes 08/07/2011  . Vitamin D deficiency 08/07/2011  . Chronic pain of both knees  01/30/2011  . PERSONAL HX COLONIC POLYPS 07/05/2010  . KNEE, ARTHRITIS, DEGEN./OSTEO 01/16/2010  . Vitamin B12 deficiency disease 10/28/2008  . 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 Morbid obesity ([place]) 08/15/2007  . Essential hypertension 08/15/2007    Shalen Petrak 05/28/2019, 4:46 PM  [facility] [address] [place], [place], [address] Phone: [phone]   Fax:  [phone]  Name: CARLISLE TIMOTHY MRN: [id number] Date of Birth: [birth year]  Raeford Razor, PT 05/28/19 4:46 PM Phone: [phone] Fax: [phone]

## 2019-06-02 ENCOUNTER — Other Ambulatory Visit: Payer: Self-pay

## 2019-06-02 ENCOUNTER — Ambulatory Visit: Payer: Federal, State, Local not specified - PPO | Admitting: Physical Therapy

## 2019-06-02 ENCOUNTER — Encounter: Payer: Self-pay | Admitting: Physical Therapy

## 2019-06-02 DIAGNOSIS — M25562 Pain in left knee: Secondary | ICD-10-CM

## 2019-06-02 DIAGNOSIS — R262 Difficulty in walking, not elsewhere classified: Secondary | ICD-10-CM

## 2019-06-02 DIAGNOSIS — R600 Localized edema: Secondary | ICD-10-CM

## 2019-06-02 DIAGNOSIS — M25662 Stiffness of left knee, not elsewhere classified: Secondary | ICD-10-CM | POA: Diagnosis not present

## 2019-06-02 NOTE — Therapy (Signed)
[place], [place], [address] Phone: [phone]   Fax:  [phone]  Physical Therapy Treatment  Patient Details  Name: Sheri Jones MRN: [id number] Date of Birth: [birth year] Referring Provider (PT): Dr. [name]    Encounter Date: 06/02/2019  PT End of Session - 06/02/19 1244    Visit Number  4    Number of Visits  16    Date for PT Re-Evaluation  07/14/19    PT Start Time  1100    PT Stop Time  1150    PT Time Calculation (min)  50 min       Past Medical History:  Diagnosis Date  . Allergic rhinitis due to pollen   . Anemia   . Diabetes mellitus without complication ([place])   . 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 Obesity, unspecified   . Osteoarthrosis, unspecified whether generalized or localized, lower leg   . Unspecified essential hypertension   . Vitamin B12 deficiency disease 10/28/2008   Qualifier: Diagnosis of  By: Moshe Cipro MD, Joycelyn Schmid      Past Surgical History:  Procedure Laterality Date  . COLONOSCOPY  2008  . EXAM UNDER ANESTHESIA WITH MANIPULATION OF KNEE Right 05/11/2015   Procedure: EXAM UNDER ANESTHESIA WITH MANIPULATION OF KNEE;  Surgeon: Carole Civil, MD;  Location: [facility];  Service: Orthopedics;  Laterality: Right;  . EYE SURGERY Left 04/27/2018   cataract  . EYE SURGERY Right 06/17/2018   cataract  . GASTRIC BYPASS  2004  . TOTAL KNEE ARTHROPLASTY Right 02/23/2015   Procedure: RIGHT TOTAL KNEE ARTHROPLASTY;  Surgeon: Carole Civil, MD;  Location: [facility];  Service: Orthopedics;  Laterality: Right;  . TOTAL KNEE ARTHROPLASTY Left 03/24/2019   Procedure: TOTAL KNEE ARTHROPLASTY;  Surgeon: Carole Civil, MD;  Location: [facility];  Service: Orthopedics;  Laterality: Left;    There were no vitals filed for this visit.      [facility] PT Assessment - 06/02/19 0001      AROM   Left Knee Extension  -25    Left Knee Flexion  78      PROM    Overall PROM Comments  80 left knee flexion                    [facility] Adult PT Treatment/Exercise - 06/02/19 0001      Knee/Hip Exercises: Aerobic   Nustep  6 minutes L5 UE/LE      Knee/Hip Exercises: Machines for Strengthening   Cybex Leg Press  1 plate bilateral       Knee/Hip Exercises: Standing   Lateral Step Up  10 reps;Step Height: 2";Step Height: 4"    Forward Step Up  10 reps;Hand Hold: 2;Step Height: 6"      Knee/Hip Exercises: Seated   Long Arc Quad  20 reps    Long Arc Quad Weight  3 lbs.    Hamstring Curl  Strengthening;Left;1 set;20 reps    Hamstring Limitations  green      Knee/Hip Exercises: Supine   Short Arc Quad Sets  20 reps   2sets   Short Arc Quad Sets Limitations  3    Straight Leg Raises  10 reps      Knee/Hip Exercises: Sidelying   Hip ABduction  Strengthening;Both;1 set;15 reps      Cryotherapy   Number Minutes Cryotherapy  8 Minutes    Cryotherapy Location  Knee    Type  of Cryotherapy  Ice pack      Manual Therapy   Manual therapy comments  patella mobs     Joint Mobilization  Flexion and exension mobs in supine     Passive ROM  flexion supine and extension               PT Short Term Goals - 05/19/19 1307      PT SHORT TERM GOAL #1   Title  Pt will be independent with initial HEP    Time  3    Period  Weeks    Status  New    Target Date  06/09/19      PT SHORT TERM GOAL #2   Title  Pt will be able to walk without cane in her household with improved confidence in balance and ability    Time  3    Period  Weeks    Status  New    Target Date  06/09/19      PT SHORT TERM GOAL #3   Title  Pt will be able to complete light activities in her home with min difficulty    Baseline  mod    Time  3    Period  Weeks    Status  New    Target Date  06/09/19      PT SHORT TERM GOAL #4   Title  Pt can show controlled descent to chair with min cueing    Time  3    Period  Weeks    Status  New    Target Date  06/09/19         PT Long Term Goals - 05/19/19 1309      PT LONG TERM GOAL #1   Title  Pt will increase right knee AROM to 10-90 deg    Time  8    Period  Weeks    Status  New    Target Date  07/17/19      PT LONG TERM GOAL #2   Title  FOTO will improve to 33% limited or better at the end of her episode of PT.    Baseline  49%    Time  8    Period  Weeks    Status  New    Target Date  07/17/19      PT LONG TERM GOAL #3   Title  Pt will be able to walk 1000 feet with or without cane, min deviation and no increase in pain in L knee.    Baseline  lacks L knee motion, poor upper trunk posture, pain increases with distance and time > 10 min    Time  8    Period  Weeks    Status  New    Target Date  07/17/19      PT LONG TERM GOAL #4   Title  Pt will be able to manage her home tasks with no difficulty    Baseline  moderate difficulty for light tasks    Time  8    Period  Weeks    Status  New    Target Date  07/17/19            Plan - 06/02/19 1258    Clinical Impression Statement  Challenged pt with more closed chain strengthening. She lacks 25 degrees of extension with weakness in quad. Continued with hamstring stretching and quad activation in open chain.    PT Next Visit Plan  ROM, NUstep, manual, begin clams , lateral hip strength standing as able,    PT Home Exercise Plan  HHPT (seated calf stretch with towel, Seated HS stretch with towel, march, knee flexion stretch on step, knee extension stretch on step, squata, standing hip abduction and extension), 05/26/2019- added LAQ with yello band and heel slide with strap       Patient will benefit from skilled therapeutic intervention in order to improve the following deficits and impairments:  Abnormal gait, Increased fascial restricitons, Pain, Postural dysfunction, Decreased scar mobility, Decreased mobility, Decreased activity tolerance, Decreased endurance, Decreased range of motion, Decreased strength, Hypomobility, Impaired  flexibility, Difficulty walking, Decreased balance, Increased edema  Visit Diagnosis: Stiffness of left knee, not elsewhere classified  Difficulty in walking, not elsewhere classified  Acute pain of left knee  Localized edema     Problem List Patient Active Problem List   Diagnosis Date Noted  . Insomnia 05/03/2019  . GERD (gastroesophageal reflux disease) 05/03/2019  . Palpitations 04/29/2019  . 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 S/P total knee arthroplasty, 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  . Primary osteoarthritis of left knee   . 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 Osteopenia 08/12/2013  . 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 Prediabetes 08/07/2011  . Vitamin D deficiency 08/07/2011  . Chronic pain of both knees 01/30/2011  . PERSONAL HX COLONIC POLYPS 07/05/2010  . KNEE, ARTHRITIS, DEGEN./OSTEO 01/16/2010  . Vitamin B12 deficiency disease 10/28/2008  . 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 Morbid obesity ([place]) 08/15/2007  . Essential hypertension 08/15/2007    Dorene Ar, PTA 06/02/2019, 12:59 PM  [facility] [address] [place], [place], [address] Phone: [phone]   Fax:  [phone]  Name: Sheri Jones MRN: [id number] Date of Birth: Aug 27, 1945

## 2019-06-03 ENCOUNTER — Other Ambulatory Visit: Payer: Self-pay | Admitting: Family Medicine

## 2019-06-04 ENCOUNTER — Other Ambulatory Visit: Payer: Self-pay

## 2019-06-04 ENCOUNTER — Ambulatory Visit: Payer: Federal, State, Local not specified - PPO | Admitting: Physical Therapy

## 2019-06-04 DIAGNOSIS — M25562 Pain in left knee: Secondary | ICD-10-CM

## 2019-06-04 DIAGNOSIS — M25662 Stiffness of left knee, not elsewhere classified: Secondary | ICD-10-CM | POA: Diagnosis not present

## 2019-06-04 DIAGNOSIS — R600 Localized edema: Secondary | ICD-10-CM

## 2019-06-04 DIAGNOSIS — R262 Difficulty in walking, not elsewhere classified: Secondary | ICD-10-CM

## 2019-06-04 NOTE — Therapy (Signed)
[place], [place], [address] Phone: [phone]   Fax:  [phone]  Physical Therapy Treatment  Patient Details  Name: Sheri Jones MRN: [id number] Date of Birth: 13-Oct-1945 Referring Provider (PT): Dr. [name]    Encounter Date: 06/04/2019  PT End of Session - 06/04/19 1214    Visit Number  5    Number of Visits  16    Date for PT Re-Evaluation  07/14/19    PT Start Time  1105    PT Stop Time  1145    PT Time Calculation (min)  40 min       Past Medical History:  Diagnosis Date  . Allergic rhinitis due to pollen   . Anemia   . Diabetes mellitus without complication ([place])   . 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 Obesity, unspecified   . Osteoarthrosis, unspecified whether generalized or localized, lower leg   . Unspecified essential hypertension   . Vitamin B12 deficiency disease 10/28/2008   Qualifier: Diagnosis of  By: Moshe Cipro MD, Joycelyn Schmid      Past Surgical History:  Procedure Laterality Date  . COLONOSCOPY  2008  . EXAM UNDER ANESTHESIA WITH MANIPULATION OF KNEE Right 05/11/2015   Procedure: EXAM UNDER ANESTHESIA WITH MANIPULATION OF KNEE;  Surgeon: Carole Civil, MD;  Location: [facility];  Service: Orthopedics;  Laterality: Right;  . EYE SURGERY Left 04/27/2018   cataract  . EYE SURGERY Right 06/17/2018   cataract  . GASTRIC BYPASS  2004  . TOTAL KNEE ARTHROPLASTY Right 02/23/2015   Procedure: RIGHT TOTAL KNEE ARTHROPLASTY;  Surgeon: Carole Civil, MD;  Location: [facility];  Service: Orthopedics;  Laterality: Right;  . TOTAL KNEE ARTHROPLASTY Left 03/24/2019   Procedure: TOTAL KNEE ARTHROPLASTY;  Surgeon: Carole Civil, MD;  Location: [facility];  Service: Orthopedics;  Laterality: Left;    There were no vitals filed for this visit.      [facility] PT Assessment - 06/04/19 0001      AROM   Left Knee Flexion  76                   [facility] Adult PT  Treatment/Exercise - 06/04/19 0001      Knee/Hip Exercises: Stretches   Active Hamstring Stretch  3 reps    Active Hamstring Stretch Limitations  seated edge of mat     Knee: Self-Stretch to increase Flexion  Left;5 reps    Knee: Self-Stretch Limitations  seated scoot stretch      Knee/Hip Exercises: Aerobic   Nustep  6 minutes L5 UE/LE      Knee/Hip Exercises: Standing   Forward Step Up  10 reps;Hand Hold: 2;Step Height: 6"    Forward Step Up Limitations  needs cues to avoid compensations, did better with 4 inch     Functional Squat Limitations  sink squat to chair       Knee/Hip Exercises: Seated   Hamstring Curl  Strengthening;Left;1 set;20 reps    Hamstring Limitations  green      Knee/Hip Exercises: Supine   Straight Leg Raises  5 reps    Straight Leg Raises Limitations  unable to lift today      Cryotherapy   Number Minutes Cryotherapy  --   sdeclined     Manual Therapy   Manual therapy comments  patella mobs                PT Short  Term Goals - 05/19/19 1307      PT SHORT TERM GOAL #1   Title  Pt will be independent with initial HEP    Time  3    Period  Weeks    Status  New    Target Date  06/09/19      PT SHORT TERM GOAL #2   Title  Pt will be able to walk without cane in her household with improved confidence in balance and ability    Time  3    Period  Weeks    Status  New    Target Date  06/09/19      PT SHORT TERM GOAL #3   Title  Pt will be able to complete light activities in her home with min difficulty    Baseline  mod    Time  3    Period  Weeks    Status  New    Target Date  06/09/19      PT SHORT TERM GOAL #4   Title  Pt can show controlled descent to chair with min cueing    Time  3    Period  Weeks    Status  New    Target Date  06/09/19        PT Long Term Goals - 05/19/19 1309      PT LONG TERM GOAL #1   Title  Pt will increase right knee AROM to 10-90 deg    Time  8    Period  Weeks    Status  New    Target  Date  07/17/19      PT LONG TERM GOAL #2   Title  FOTO will improve to 33% limited or better at the end of her episode of PT.    Baseline  49%    Time  8    Period  Weeks    Status  New    Target Date  07/17/19      PT LONG TERM GOAL #3   Title  Pt will be able to walk 1000 feet with or without cane, min deviation and no increase in pain in L knee.    Baseline  lacks L knee motion, poor upper trunk posture, pain increases with distance and time > 10 min    Time  8    Period  Weeks    Status  New    Target Date  07/17/19      PT LONG TERM GOAL #4   Title  Pt will be able to manage her home tasks with no difficulty    Baseline  moderate difficulty for light tasks    Time  8    Period  Weeks    Status  New    Target Date  07/17/19            Plan - 06/04/19 1202    Clinical Impression Statement  Worked with pt on decreasing her compensations with step ups. She responded well with cues however needs heavy arm pull to rise up step. Continued with knee and hip strengthening. Unable to perform SLR from mat today. Encouraged her to work on seated marching for hip flexion strength.    PT Next Visit Plan  ROM, NUstep, manual, begin clams , lateral hip strength standing as able, streamline HEP    PT Home Exercise Plan  HHPT (seated calf stretch with towel, Seated HS stretch with towel, march, knee flexion stretch on  step, knee extension stretch on step, squata, standing hip abduction and extension), 05/26/2019- added LAQ with yello band and heel slide with strap       Patient will benefit from skilled therapeutic intervention in order to improve the following deficits and impairments:  Abnormal gait, Increased fascial restricitons, Pain, Postural dysfunction, Decreased scar mobility, Decreased mobility, Decreased activity tolerance, Decreased endurance, Decreased range of motion, Decreased strength, Hypomobility, Impaired flexibility, Difficulty walking, Decreased balance, Increased  edema  Visit Diagnosis: Stiffness of left knee, not elsewhere classified  Difficulty in walking, not elsewhere classified  Acute pain of left knee  Localized edema     Problem List Patient Active Problem List   Diagnosis Date Noted  . Insomnia 05/03/2019  . GERD (gastroesophageal reflux disease) 05/03/2019  . Palpitations 04/29/2019  . 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 S/P total knee arthroplasty, 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  . Primary osteoarthritis of left knee   . 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 Osteopenia 08/12/2013  . 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 Prediabetes 08/07/2011  . Vitamin D deficiency 08/07/2011  . Chronic pain of both knees 01/30/2011  . PERSONAL HX COLONIC POLYPS 07/05/2010  . KNEE, ARTHRITIS, DEGEN./OSTEO 01/16/2010  . Vitamin B12 deficiency disease 10/28/2008  . 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 Morbid obesity ([place]) 08/15/2007  . Essential hypertension 08/15/2007    Dorene Ar , PTA 06/04/2019, 12:15 PM  [facility] [address] [place], [place], [address] Phone: [phone]   Fax:  [phone]  Name: NEETU BOKA MRN: [id number] Date of Birth: 1945/12/05

## 2019-06-09 ENCOUNTER — Encounter: Payer: Self-pay | Admitting: Physical Therapy

## 2019-06-09 ENCOUNTER — Ambulatory Visit: Payer: Federal, State, Local not specified - PPO | Admitting: Physical Therapy

## 2019-06-09 ENCOUNTER — Other Ambulatory Visit: Payer: Self-pay

## 2019-06-09 DIAGNOSIS — M25562 Pain in left knee: Secondary | ICD-10-CM

## 2019-06-09 DIAGNOSIS — M25662 Stiffness of left knee, not elsewhere classified: Secondary | ICD-10-CM | POA: Diagnosis not present

## 2019-06-09 DIAGNOSIS — R600 Localized edema: Secondary | ICD-10-CM

## 2019-06-09 DIAGNOSIS — R262 Difficulty in walking, not elsewhere classified: Secondary | ICD-10-CM

## 2019-06-09 NOTE — Therapy (Signed)
[place], [place], [address] Phone: [phone]   Fax:  [phone]  Physical Therapy Treatment  Patient Details  Name: Sheri Jones MRN: [id number] Date of Birth: [birth year] Referring Provider (PT): Dr. [name]    Encounter Date: 06/09/2019  PT End of Session - 06/09/19 1108    Visit Number  6    Number of Visits  16    Date for PT Re-Evaluation  07/14/19    PT Start Time  1100    PT Stop Time  1145    PT Time Calculation (min)  45 min       Past Medical History:  Diagnosis Date  . Allergic rhinitis due to pollen   . Anemia   . Diabetes mellitus without complication ([place])   . 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 Obesity, unspecified   . Osteoarthrosis, unspecified whether generalized or localized, lower leg   . Unspecified essential hypertension   . Vitamin B12 deficiency disease 10/28/2008   Qualifier: Diagnosis of  By: Moshe Cipro MD, Joycelyn Schmid      Past Surgical History:  Procedure Laterality Date  . COLONOSCOPY  2008  . EXAM UNDER ANESTHESIA WITH MANIPULATION OF KNEE Right 05/11/2015   Procedure: EXAM UNDER ANESTHESIA WITH MANIPULATION OF KNEE;  Surgeon: Carole Civil, MD;  Location: [facility];  Service: Orthopedics;  Laterality: Right;  . EYE SURGERY Left 04/27/2018   cataract  . EYE SURGERY Right 06/17/2018   cataract  . GASTRIC BYPASS  2004  . TOTAL KNEE ARTHROPLASTY Right 02/23/2015   Procedure: RIGHT TOTAL KNEE ARTHROPLASTY;  Surgeon: Carole Civil, MD;  Location: [facility];  Service: Orthopedics;  Laterality: Right;  . TOTAL KNEE ARTHROPLASTY Left 03/24/2019   Procedure: TOTAL KNEE ARTHROPLASTY;  Surgeon: Carole Civil, MD;  Location: [facility];  Service: Orthopedics;  Laterality: Left;    There were no vitals filed for this visit.  Subjective Assessment - 06/09/19 1107    Subjective  No pain now. I did my exercises.    Currently in Pain?  No/denies                        [facility] Adult PT Treatment/Exercise - 06/09/19 0001      Knee/Hip Exercises: Stretches   Active Hamstring Stretch  3 reps    Active Hamstring Stretch Limitations  30 sec x 3 foot in stool     Knee: Self-Stretch to increase Flexion  Left;5 reps    Knee: Self-Stretch Limitations  seated scoot stretch      Knee/Hip Exercises: Aerobic   Nustep  L5 x 5 min      Knee/Hip Exercises: Standing   Heel Raises  10 reps    Hip Flexion  10 reps    Hip Abduction  10 reps    Wall Squat  10 reps    Wall Squat Limitations  2 sets     Other Standing Knee Exercises  tandem trials       Knee/Hip Exercises: Seated   Long Arc Quad  20 reps    Long Arc Quad Weight  3 lbs.    Hamstring Curl  Strengthening;Left;1 set;20 reps    Hamstring Limitations  green    Sit to Sand  10 reps   elevated seat, without UE      Knee/Hip Exercises: Supine   Short Arc Quad Sets  20 reps   2sets   Short  Arc [employer] Limitations  3    Straight Leg Raises  10 reps    Straight Leg Raises Limitations  needs UE assist to get started       Knee/Hip Exercises: Sidelying   Hip ABduction  Left;10 reps    Hip ABduction Limitations  needs UE assist to get started       Manual Therapy   Passive ROM  flexion supine and extension               PT Short Term Goals - 06/09/19 1124      PT SHORT TERM GOAL #1   Title  Pt will be independent with initial HEP    Time  3    Period  Weeks    Status  Achieved    Target Date  06/09/19      PT SHORT TERM GOAL #2   Title  Pt will be able to walk without cane in her household with improved confidence in balance and ability    Baseline  does not use AD in the house    Time  3    Period  Weeks    Status  Achieved      PT SHORT TERM GOAL #3   Title  Pt will be able to complete light activities in her home with min difficulty    Baseline  has returned to all ADLs    Time  3    Period  Weeks    Status  Achieved    Target Date   06/09/19      PT SHORT TERM GOAL #4   Title  Pt can show controlled descent to chair with min cueing    Baseline  lack of ROM to allow controlled descent    Time  3    Period  Weeks    Status  On-going    Target Date  06/09/19        PT Long Term Goals - 05/19/19 1309      PT LONG TERM GOAL #1   Title  Pt will increase right knee AROM to 10-90 deg    Time  8    Period  Weeks    Status  New    Target Date  07/17/19      PT LONG TERM GOAL #2   Title  FOTO will improve to 33% limited or better at the end of her episode of PT.    Baseline  49%    Time  8    Period  Weeks    Status  New    Target Date  07/17/19      PT LONG TERM GOAL #3   Title  Pt will be able to walk 1000 feet with or without cane, min deviation and no increase in pain in L knee.    Baseline  lacks L knee motion, poor upper trunk posture, pain increases with distance and time > 10 min    Time  8    Period  Weeks    Status  New    Target Date  07/17/19      PT LONG TERM GOAL #4   Title  Pt will be able to manage her home tasks with no difficulty    Baseline  moderate difficulty for light tasks    Time  8    Period  Weeks    Status  New    Target Date  07/17/19  Plan - 06/09/19 1155    Clinical Impression Statement  Pt reports she is not using AD in her home. She has returned to independent ADLs including household chores. She still has difficulty with the strength to rise and lower to chair with good strength and control. Worked with sit-stand from elevated seat and also wall slides. She as able to rise and lower without hands from elevated seat. Encouraged her to work on this at home. Cues also provided for gait to incresaed knee flexion and heel strike with ambulation.    PT Next Visit Plan  ROM, NUstep, manual, begin clams , lateral hip strength standing as able, streamline HEP    PT Home Exercise Plan  HHPT (seated calf stretch with towel, Seated HS stretch with towel, march, knee  flexion stretch on step, knee extension stretch on step, squata, standing hip abduction and extension), 05/26/2019- added LAQ with yello band and heel slide with strap       Patient will benefit from skilled therapeutic intervention in order to improve the following deficits and impairments:  Abnormal gait, Increased fascial restricitons, Pain, Postural dysfunction, Decreased scar mobility, Decreased mobility, Decreased activity tolerance, Decreased endurance, Decreased range of motion, Decreased strength, Hypomobility, Impaired flexibility, Difficulty walking, Decreased balance, Increased edema  Visit Diagnosis: Stiffness of left knee, not elsewhere classified  Difficulty in walking, not elsewhere classified  Acute pain of left knee  Localized edema     Problem List Patient Active Problem List   Diagnosis Date Noted  . Insomnia 05/03/2019  . GERD (gastroesophageal reflux disease) 05/03/2019  . Palpitations 04/29/2019  . 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 S/P total knee arthroplasty, 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  . Primary osteoarthritis of left knee   . 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 Osteopenia 08/12/2013  . 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 Prediabetes 08/07/2011  . Vitamin D deficiency 08/07/2011  . Chronic pain of both knees 01/30/2011  . PERSONAL HX COLONIC POLYPS 07/05/2010  . KNEE, ARTHRITIS, DEGEN./OSTEO 01/16/2010  . Vitamin B12 deficiency disease 10/28/2008  . 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 Morbid obesity ([place]) 08/15/2007  . Essential hypertension 08/15/2007    Dorene Ar, PTA 06/09/2019, 12:06 PM  [facility] [address] [place], [place], [address] Phone: [phone]   Fax:  [phone]  Name: Sheri Jones MRN: [id number] Date of Birth: 1945-06-08

## 2019-06-10 ENCOUNTER — Ambulatory Visit: Payer: Federal, State, Local not specified - PPO | Admitting: Family Medicine

## 2019-06-11 ENCOUNTER — Ambulatory Visit: Payer: Federal, State, Local not specified - PPO | Admitting: Physical Therapy

## 2019-06-16 ENCOUNTER — Encounter: Payer: Self-pay | Admitting: Physical Therapy

## 2019-06-16 ENCOUNTER — Ambulatory Visit: Payer: Federal, State, Local not specified - PPO | Admitting: Physical Therapy

## 2019-06-16 ENCOUNTER — Other Ambulatory Visit: Payer: Self-pay

## 2019-06-16 DIAGNOSIS — R600 Localized edema: Secondary | ICD-10-CM

## 2019-06-16 DIAGNOSIS — M25662 Stiffness of left knee, not elsewhere classified: Secondary | ICD-10-CM

## 2019-06-16 DIAGNOSIS — R262 Difficulty in walking, not elsewhere classified: Secondary | ICD-10-CM

## 2019-06-16 DIAGNOSIS — M25562 Pain in left knee: Secondary | ICD-10-CM

## 2019-06-16 NOTE — Therapy (Signed)
[place], [place], [address] Phone: [phone]   Fax:  [phone]  Physical Therapy Treatment  Patient Details  Name: Sheri Jones MRN: [id number] Date of Birth: Jul 25, 1945 Referring Provider (PT): Dr. [name]    Encounter Date: 06/16/2019  PT End of Session - 06/16/19 1139    Visit Number  7    Number of Visits  16    Date for PT Re-Evaluation  07/14/19    PT Start Time  1134    PT Stop Time  1215    PT Time Calculation (min)  41 min    Activity Tolerance  Patient tolerated treatment well    Behavior During Therapy  [facility] for tasks assessed/performed       Past Medical History:  Diagnosis Date  . Allergic rhinitis due to pollen   . Anemia   . Diabetes mellitus without complication ([place])   . 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 Obesity, unspecified   . Osteoarthrosis, unspecified whether generalized or localized, lower leg   . Unspecified essential hypertension   . Vitamin B12 deficiency disease 10/28/2008   Qualifier: Diagnosis of  By: Moshe Cipro MD, Joycelyn Schmid      Past Surgical History:  Procedure Laterality Date  . COLONOSCOPY  2008  . EXAM UNDER ANESTHESIA WITH MANIPULATION OF KNEE Right 05/11/2015   Procedure: EXAM UNDER ANESTHESIA WITH MANIPULATION OF KNEE;  Surgeon: Carole Civil, MD;  Location: [facility];  Service: Orthopedics;  Laterality: Right;  . EYE SURGERY Left 04/27/2018   cataract  . EYE SURGERY Right 06/17/2018   cataract  . GASTRIC BYPASS  2004  . TOTAL KNEE ARTHROPLASTY Right 02/23/2015   Procedure: RIGHT TOTAL KNEE ARTHROPLASTY;  Surgeon: Carole Civil, MD;  Location: [facility];  Service: Orthopedics;  Laterality: Right;  . TOTAL KNEE ARTHROPLASTY Left 03/24/2019   Procedure: TOTAL KNEE ARTHROPLASTY;  Surgeon: Carole Civil, MD;  Location: [facility];  Service: Orthopedics;  Laterality: Left;    There were no vitals filed for this  visit.  Subjective Assessment - 06/16/19 1358    Subjective  I feel good. this is my last visit. I see my doctor soon.  How long do I come?    Currently in Pain?  No/denies         [facility] PT Assessment - 06/16/19 0001      AROM   Left Knee Extension  15    Left Knee Flexion  79   AAROM     Strength   Right Hip ABduction  3/5    Left Hip ABduction  2+/5    Right Knee Flexion  5/5    Right Knee Extension  4+/5    Left Knee Flexion  5/5    Left Knee Extension  4+/5      Transfers   Five time sit to stand comments   36 sec        [facility] Adult PT Treatment/Exercise - 06/16/19 0001      Knee/Hip Exercises: Stretches   Active Hamstring Stretch  3 reps    Active Hamstring Stretch Limitations  sheet, belt at home     Knee: Self-Stretch to increase Flexion  Left;20 seconds    Knee: Self-Stretch Limitations  x 10 strap and seated       Knee/Hip Exercises: Aerobic   Nustep  L5 x 7 min       Knee/Hip Exercises: Supine   Bridges Limitations  on ball x 15              PT Education - 06/16/19 1158    Education Details  strength vs stretch , cont POC, ROM, knee flexion    Person(s) Educated  Patient    Methods  Explanation    Comprehension  Verbalized understanding;Need further instruction       PT Short Term Goals - 06/16/19 1139      PT SHORT TERM GOAL #1   Title  Pt will be independent with initial HEP    Status  Achieved      PT SHORT TERM GOAL #2   Title  Pt will be able to walk without cane in her household with improved confidence in balance and ability    Status  Achieved      PT SHORT TERM GOAL #3   Title  Pt will be able to complete light activities in her home with min difficulty    Status  Achieved      PT SHORT TERM GOAL #4   Title  Pt can show controlled descent to chair with min cueing    Baseline  lack of ROM to allow controlled descent    Status  On-going        PT Long Term Goals - 06/16/19 1140      PT LONG TERM GOAL #1   Title  Pt  will increase right knee AROM to 10-90 deg      PT LONG TERM GOAL #2   Title  FOTO will improve to 33% limited or better at the end of her episode of PT.      PT LONG TERM GOAL #3   Title  Pt will be able to walk 1000 feet with or without cane, min deviation and no increase in pain in L knee.      PT LONG TERM GOAL #4   Title  Pt will be able to manage her home tasks with no difficulty    Status  Achieved            Plan - 06/16/19 1401    Clinical Impression Statement  Patient has met many goals. FOTO score improved  She has not been stretching her knee into flexion.  Weakness in hips, needs hands to assist with her bending into hooklying.  Able to get to 80 deg L knee flexion, improved in extension to lacking 15 deg. Will benefit fom more PT for strength and ROM of hips, knee    PT Treatment/Interventions  ADLs/Self Care Home Management;Electrical Stimulation;Therapeutic activities;Patient/family education;Taping;Therapeutic exercise;Cryotherapy;Moist Heat;Gait training;Stair training;Functional mobility training;Neuromuscular re-education;Manual techniques;Passive range of motion;Vasopneumatic Device    PT Next Visit Plan  ROM, NUstep, manual, begin clams , lateral hip strength standing as able, streamline HEP    PT Home Exercise Plan  HHPT (seated calf stretch with towel, Seated HS stretch with towel, march, knee flexion stretch on step, knee extension stretch on step, squata, standing hip abduction and extension), 05/26/2019- added LAQ with yello band and heel slide with strap    Consulted and Agree with Plan of Care  Patient       Patient will benefit from skilled therapeutic intervention in order to improve the following deficits and impairments:  Abnormal gait, Increased fascial restricitons, Pain, Postural dysfunction, Decreased scar mobility, Decreased mobility, Decreased activity tolerance, Decreased endurance, Decreased range of motion, Decreased strength, Hypomobility, Impaired  flexibility, Difficulty walking, Decreased balance, Increased edema  Visit Diagnosis: Stiffness of left  knee, not elsewhere classified  Difficulty in walking, not elsewhere classified  Acute pain of left knee  Localized edema     Problem List Patient Active Problem List   Diagnosis Date Noted  . Insomnia 05/03/2019  . GERD (gastroesophageal reflux disease) 05/03/2019  . Palpitations 04/29/2019  . 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 S/P total knee arthroplasty, 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  . Primary osteoarthritis of left knee   . 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 Osteopenia 08/12/2013  . 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 Prediabetes 08/07/2011  . Vitamin D deficiency 08/07/2011  . Chronic pain of both knees 01/30/2011  . PERSONAL HX COLONIC POLYPS 07/05/2010  . KNEE, ARTHRITIS, DEGEN./OSTEO 01/16/2010  . Vitamin B12 deficiency disease 10/28/2008  . 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 Morbid obesity ([place]) 08/15/2007  . Essential hypertension 08/15/2007    Lavell Ridings 06/16/2019, 2:40 PM  [facility] [address] [place], [place], [address] Phone: [phone]   Fax:  [phone]  Name: Sheri Jones MRN: [id number] Date of Birth: [birth year]  Raeford Razor, PT 06/16/19 2:42 PM Phone: [phone] Fax: [phone]

## 2019-06-16 NOTE — Patient Instructions (Signed)
Heel Slide    Bend knee and pull heel toward buttocks. Hold __15__ seconds. Return. Repeat with other knee. Repeat _10___ times. Do __3__ sessions per day. USE A SHEET TO PULL YOUR FOOT UP AND BEND YOUR KNEE UNTIL IT IS UNCOMFORTABLE.  http://gt2.exer.us/372   Copyright  VHI. All rights reserved.

## 2019-06-18 ENCOUNTER — Ambulatory Visit: Payer: Federal, State, Local not specified - PPO | Admitting: Physical Therapy

## 2019-06-18 ENCOUNTER — Other Ambulatory Visit: Payer: Self-pay

## 2019-06-18 ENCOUNTER — Encounter: Payer: Self-pay | Admitting: Physical Therapy

## 2019-06-18 DIAGNOSIS — M25662 Stiffness of left knee, not elsewhere classified: Secondary | ICD-10-CM

## 2019-06-18 DIAGNOSIS — R262 Difficulty in walking, not elsewhere classified: Secondary | ICD-10-CM

## 2019-06-18 DIAGNOSIS — R600 Localized edema: Secondary | ICD-10-CM

## 2019-06-18 DIAGNOSIS — M25562 Pain in left knee: Secondary | ICD-10-CM

## 2019-06-18 NOTE — Patient Instructions (Signed)
Access Code: [email or web address]  URL: [email or web address]  Date: 06/18/2019  Prepared by: Raeford Razor   Exercises  Seated Knee Flexion with Anchored Resistance - 10 reps - 2 sets - 5 hold - 2x daily - 7x weekly  Seated Knee Extension with Resistance - 10 reps - 2 sets - 5 hold - 2x daily - 7x weekly

## 2019-06-18 NOTE — Therapy (Addendum)
[facility] [address] [place], [place], [address] Phone: [phone]   Fax:  [phone]  Physical Therapy Treatment/Discharge  Patient Details  Name: Sheri Jones MRN: [id number] Date of Birth: 10/14/1945 Referring Provider (PT): Dr. Stanley Harrison    Encounter Date: 06/18/2019  PT End of Session - 06/18/19 1327    Visit Number  8    Number of Visits  16    Date for PT Re-Evaluation  07/14/19    PT Start Time  1316    PT Stop Time  1400    PT Time Calculation (min)  44 min    Activity Tolerance  Patient tolerated treatment well    Behavior During Therapy  WFL for tasks assessed/performed       Past Medical History:  Diagnosis Date  . Allergic rhinitis due to pollen   . Anemia   . Diabetes mellitus without complication ([facility])   . 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 Obesity, unspecified   . Osteoarthrosis, unspecified whether generalized or localized, lower leg   . Unspecified essential hypertension   . Vitamin B12 deficiency disease 10/28/2008   Qualifier: Diagnosis of  By: [place] MD, Margaret      Past Surgical History:  Procedure Laterality Date  . COLONOSCOPY  2008  . EXAM UNDER ANESTHESIA WITH MANIPULATION OF KNEE Right 05/11/2015   Procedure: EXAM UNDER ANESTHESIA WITH MANIPULATION OF KNEE;  Surgeon: Stanley E Harrison, MD;  Location: [facility];  Service: Orthopedics;  Laterality: Right;  . EYE SURGERY Left 04/27/2018   cataract  . EYE SURGERY Right 06/17/2018   cataract  . GASTRIC BYPASS  2004  . TOTAL KNEE ARTHROPLASTY Right 02/23/2015   Procedure: RIGHT TOTAL KNEE ARTHROPLASTY;  Surgeon: Stanley E Harrison, MD;  Location: [facility];  Service: Orthopedics;  Laterality: Right;  . TOTAL KNEE ARTHROPLASTY Left 03/24/2019   Procedure: TOTAL KNEE ARTHROPLASTY;  Surgeon: Harrison, Stanley E, MD;  Location: [facility];  Service: Orthopedics;  Laterality: Left;    There were no vitals filed for this  visit.  Subjective Assessment - 06/18/19 1324    Subjective  I didnt wanna come.  My sister is at my house. No pain today.          [facility] Adult PT Treatment/Exercise - 06/18/19 0001      Knee/Hip Exercises: Aerobic   Nustep  L5 x 7 min       Knee/Hip Exercises: Standing   Extension Limitations  wall weight shifts x 10 each     Forward Step Up  Both;2 sets;10 reps    Wall Squat  1 set;15 reps    SLS with Vectors  hip extension off step x 10       Knee/Hip Exercises: Seated   Long Arc Quad Limitations  blue band x 20     Clamshell with TheraBand  Blue   x 20    Hamstring Limitations  blue band x 20       Manual Therapy   Joint Mobilization  seated joint distraction and mobs Gr III for flexion     Soft tissue mobilization  IASTM in supine leg hanging off table for L ant hip flexor stretch     Passive ROM  flexion and extension                PT Short Term Goals - 06/16/19 1139      PT SHORT TERM GOAL #1   Title  Pt   will be independent with initial HEP    Status  Achieved      PT SHORT TERM GOAL #2   Title  Pt will be able to walk without cane in her household with improved confidence in balance and ability    Status  Achieved      PT SHORT TERM GOAL #3   Title  Pt will be able to complete light activities in her home with min difficulty    Status  Achieved      PT SHORT TERM GOAL #4   Title  Pt can show controlled descent to chair with min cueing    Baseline  lack of ROM to allow controlled descent    Status  On-going        PT Long Term Goals - 06/18/19 1633      PT LONG TERM GOAL #1   Title  Pt will increase right knee AROM to 10-90 deg    Status  On-going      PT LONG TERM GOAL #2   Title  FOTO will improve to 33% limited or better at the end of her episode of PT.    Status  On-going      PT LONG TERM GOAL #3   Title  Pt will be able to walk 1000 feet with or without cane, min deviation and no increase in pain in L knee.    Status  On-going       PT LONG TERM GOAL #4   Title  Pt will be able to manage her home tasks with no difficulty    Status  On-going            Plan - 06/18/19 1629    Clinical Impression Statement  Able to get to 86 deg of knee flexion after seated manual.  Recommends she continue PT and call clinic to get in next week.  Sees MD 06/24/19. Goals in progress.    PT Treatment/Interventions  ADLs/Self Care Home Management;Electrical Stimulation;Therapeutic activities;Patient/family education;Taping;Therapeutic exercise;Cryotherapy;Moist Heat;Gait training;Stair training;Functional mobility training;Neuromuscular re-education;Manual techniques;Passive range of motion;Vasopneumatic Device    PT Next Visit Plan  ROM, NUstep, manual, begin clams , lateral hip strength standing as able, streamline HEP    PT Home Exercise Plan  HHPT (seated calf stretch with towel, Seated HS stretch with towel, march, knee flexion stretch on step, knee extension stretch on step, squata, standing hip abduction and extension), 05/26/2019- added LAQ with yello band and heel slide with strap    Consulted and Agree with Plan of Care  Patient       Patient will benefit from skilled therapeutic intervention in order to improve the following deficits and impairments:  Abnormal gait, Increased fascial restricitons, Pain, Postural dysfunction, Decreased scar mobility, Decreased mobility, Decreased activity tolerance, Decreased endurance, Decreased range of motion, Decreased strength, Hypomobility, Impaired flexibility, Difficulty walking, Decreased balance, Increased edema  Visit Diagnosis: Stiffness of left knee, not elsewhere classified  Difficulty in walking, not elsewhere classified  Acute pain of left knee  Localized edema     Problem List Patient Active Problem List   Diagnosis Date Noted  . Insomnia 05/03/2019  . GERD (gastroesophageal reflux disease) 05/03/2019  . Palpitations 04/29/2019  . S/P total knee replacement, left  03/24/2019 04/07/2019  . S/P total knee arthroplasty, 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  . Primary osteoarthritis of left knee   . 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 Osteopenia 08/12/2013  . 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  Prediabetes 08/07/2011  . Vitamin D deficiency 08/07/2011  . Chronic pain of both knees 01/30/2011  . PERSONAL HX COLONIC POLYPS 07/05/2010  . KNEE, ARTHRITIS, DEGEN./OSTEO 01/16/2010  . Vitamin B12 deficiency disease 10/28/2008  . 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 Morbid obesity ([facility]) 08/15/2007  . Essential hypertension 08/15/2007    PAA,JENNIFER 06/18/2019, 4:34 PM  [facility] [address] [place], [place], [address] Phone: [phone]   Fax:  [phone]  Name: Lesleyanne S Fabiano MRN: [id number] Date of Birth: 03/03/1946  Jennifer Paa, PT 06/18/19 4:35 PM Phone: [phone] Fax: [phone]  PHYSICAL THERAPY DISCHARGE SUMMARY  Visits from Start of Care: 8  Current functional level related to goals / functional outcomes: See above   Remaining deficits: AROM of knee.  Strength    Education / Equipment: HEP, RICE, ROM  Plan: Patient agrees to discharge.  Patient goals were partially met. Patient is being discharged due to the patient's request.  ?????     No pain but AROM limited.    Jennifer Paa, PT 07/13/19 8:24 AM Phone: [phone] Fax: [phone]  

## 2019-06-19 ENCOUNTER — Inpatient Hospital Stay (HOSPITAL_COMMUNITY): Payer: Federal, State, Local not specified - PPO | Attending: Hematology

## 2019-06-19 DIAGNOSIS — Z9884 Bariatric surgery status: Secondary | ICD-10-CM | POA: Insufficient documentation

## 2019-06-19 DIAGNOSIS — K909 Intestinal malabsorption, unspecified: Secondary | ICD-10-CM | POA: Diagnosis not present

## 2019-06-19 DIAGNOSIS — E538 Deficiency of other specified B group vitamins: Secondary | ICD-10-CM | POA: Diagnosis not present

## 2019-06-19 LAB — CBC WITH DIFFERENTIAL/PLATELET
Abs Immature Granulocytes: 0.01 10*3/uL (ref 0.00–0.07)
Basophils Absolute: 0 10*3/uL (ref 0.0–0.1)
Basophils Relative: 0 %
Eosinophils Absolute: 0.2 10*3/uL (ref 0.0–0.5)
Eosinophils Relative: 4 %
HCT: 41.3 % (ref 36.0–46.0)
Hemoglobin: 13.5 g/dL (ref 12.0–15.0)
Immature Granulocytes: 0 %
Lymphocytes Relative: 37 %
Lymphs Abs: 2 10*3/uL (ref 0.7–4.0)
MCH: 30.2 pg (ref 26.0–34.0)
MCHC: 32.7 g/dL (ref 30.0–36.0)
MCV: 92.4 fL (ref 80.0–100.0)
Monocytes Absolute: 0.6 10*3/uL (ref 0.1–1.0)
Monocytes Relative: 11 %
Neutro Abs: 2.6 10*3/uL (ref 1.7–7.7)
Neutrophils Relative %: 48 %
Platelets: 197 10*3/uL (ref 150–400)
RBC: 4.47 MIL/uL (ref 3.87–5.11)
RDW: 15.9 % — ABNORMAL HIGH (ref 11.5–15.5)
WBC: 5.3 10*3/uL (ref 4.0–10.5)
nRBC: 0 % (ref 0.0–0.2)

## 2019-06-19 LAB — COMPREHENSIVE METABOLIC PANEL
ALT: 20 U/L (ref 0–44)
AST: 28 U/L (ref 15–41)
Albumin: 3.8 g/dL (ref 3.5–5.0)
Alkaline Phosphatase: 83 U/L (ref 38–126)
Anion gap: 11 (ref 5–15)
BUN: 20 mg/dL (ref 8–23)
CO2: 26 mmol/L (ref 22–32)
Calcium: 9.3 mg/dL (ref 8.9–10.3)
Chloride: 104 mmol/L (ref 98–111)
Creatinine, Ser: 0.95 mg/dL (ref 0.44–1.00)
GFR calc Af Amer: >60 mL/min (ref >60)
GFR calc non Af Amer: >60 mL/min (ref >60)
Glucose, Bld: 83 mg/dL (ref 70–99)
Potassium: 3.2 mmol/L — ABNORMAL LOW (ref 3.5–5.1)
Sodium: 141 mmol/L (ref 135–145)
Total Bilirubin: 0.4 mg/dL (ref 0.3–1.2)
Total Protein: 7.3 g/dL (ref 6.5–8.1)

## 2019-06-19 LAB — VITAMIN B12: Vitamin B-12: 957 pg/mL — ABNORMAL HIGH (ref 180–914)

## 2019-06-19 LAB — VITAMIN D 25 HYDROXY (VIT D DEFICIENCY, FRACTURES): Vit D, 25-Hydroxy: 39.81 ng/mL (ref 30–100)

## 2019-06-19 LAB — FOLATE: Folate: 20.3 ng/mL (ref >5.9)

## 2019-06-24 ENCOUNTER — Ambulatory Visit (INDEPENDENT_AMBULATORY_CARE_PROVIDER_SITE_OTHER): Payer: Federal, State, Local not specified - PPO | Admitting: Orthopedic Surgery

## 2019-06-24 ENCOUNTER — Other Ambulatory Visit: Payer: Self-pay

## 2019-06-24 VITALS — BP 130/83 | HR 80 | Temp 97.5°F | Ht 63.0 in | Wt 191.0 lb

## 2019-06-24 DIAGNOSIS — Z96652 Presence of left artificial knee joint: Secondary | ICD-10-CM

## 2019-06-24 NOTE — Patient Instructions (Signed)
Stop therapy  Home exercises  Return 3 months

## 2019-06-24 NOTE — Progress Notes (Signed)
Chief Complaint  Patient presents with  . Follow-up    Recheck on left knee replacement, DOS 03-24-19.    74 year old female developed arthrofibrosis in her right total knee had a manipulation and despite preventative measures as developed arthrofibrosis left knee tried therapy no improvement no advancement.  Fortunately not having any pain she is ambulatory with a cane her range of motion is about 0-70  Recommend stop therapy do home exercises return in 3 months  Encounter Diagnosis  Name Primary?  . S/P total knee replacement, 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 Yes

## 2019-06-26 ENCOUNTER — Ambulatory Visit (HOSPITAL_COMMUNITY): Payer: Federal, State, Local not specified - PPO

## 2019-06-26 ENCOUNTER — Ambulatory Visit (HOSPITAL_COMMUNITY): Payer: Federal, State, Local not specified - PPO | Admitting: Nurse Practitioner

## 2019-06-26 ENCOUNTER — Inpatient Hospital Stay (HOSPITAL_COMMUNITY): Payer: Federal, State, Local not specified - PPO | Attending: Hematology | Admitting: Nurse Practitioner

## 2019-06-26 ENCOUNTER — Other Ambulatory Visit: Payer: Self-pay

## 2019-06-26 ENCOUNTER — Inpatient Hospital Stay (HOSPITAL_COMMUNITY): Payer: Federal, State, Local not specified - PPO

## 2019-06-26 DIAGNOSIS — K909 Intestinal malabsorption, unspecified: Secondary | ICD-10-CM | POA: Diagnosis not present

## 2019-06-26 DIAGNOSIS — E538 Deficiency of other specified B group vitamins: Secondary | ICD-10-CM | POA: Diagnosis not present

## 2019-06-26 DIAGNOSIS — Z803 Family history of malignant neoplasm of breast: Secondary | ICD-10-CM | POA: Diagnosis not present

## 2019-06-26 DIAGNOSIS — Z8249 Family history of ischemic heart disease and other diseases of the circulatory system: Secondary | ICD-10-CM | POA: Insufficient documentation

## 2019-06-26 DIAGNOSIS — Z791 Long term (current) use of non-steroidal anti-inflammatories (NSAID): Secondary | ICD-10-CM | POA: Insufficient documentation

## 2019-06-26 DIAGNOSIS — Z801 Family history of malignant neoplasm of trachea, bronchus and lung: Secondary | ICD-10-CM | POA: Diagnosis not present

## 2019-06-26 DIAGNOSIS — Z9884 Bariatric surgery status: Secondary | ICD-10-CM | POA: Insufficient documentation

## 2019-06-26 DIAGNOSIS — I10 Essential (primary) hypertension: Secondary | ICD-10-CM | POA: Diagnosis not present

## 2019-06-26 DIAGNOSIS — Z7982 Long term (current) use of aspirin: Secondary | ICD-10-CM | POA: Insufficient documentation

## 2019-06-26 DIAGNOSIS — Z79899 Other long term (current) drug therapy: Secondary | ICD-10-CM | POA: Insufficient documentation

## 2019-06-26 MED ORDER — CYANOCOBALAMIN 1000 MCG/ML IJ SOLN
1000.0000 ug | Freq: Once | INTRAMUSCULAR | Status: AC
  Administered 2019-06-26: 1000 ug via INTRAMUSCULAR
  Filled 2019-06-26: qty 1

## 2019-06-26 NOTE — Assessment & Plan Note (Signed)
1.  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 - She remains on monthly B12 injections.  She tolerates these injections very well. -We discussed the option of her administering these injections at home to herself.  She does not feel comfortable at this time. - Labs done on 06/19/2019 showed vitamin B12 level 957 -We will continue the monthly B12 injections. - She will return for follow-up visit in 1 year with labs.

## 2019-06-26 NOTE — Progress Notes (Signed)
[place] [place], [place] [address]   CLINIC:  Medical Oncology/Hematology  PCP:  Fayrene Helper, MD [address], [address] [place] [address] [phone]   REASON FOR VISIT: Follow-up for vitamin B12 deficiency  CURRENT THERAPY: Monthly B12 injections   INTERVAL HISTORY:  Ms. Sheri Jones 74 y.o. female returns for routine follow-up for vitamin B12 deficiency.  Patient reports she is doing well since her last visit.  She has no complaints at this time.  She denies any easy bruising or bleeding.  She denies any numbness. Denies any nausea, vomiting, or diarrhea. Denies any new pains. Had not noticed any recent bleeding such as epistaxis, hematuria or hematochezia. Denies recent chest pain on exertion, shortness of breath on minimal exertion, pre-syncopal episodes, or palpitations. Denies any numbness or tingling in hands or feet. Denies any recent fevers, infections, or recent hospitalizations. Patient reports appetite at 100% and energy level at 100%.  She is eating well maintaining her weight this time.    REVIEW OF SYSTEMS:  Review of Systems  Neurological: Positive for headaches.  All other systems reviewed and are negative.    PAST MEDICAL/SURGICAL HISTORY:  Past Medical History:  Diagnosis Date  . Allergic rhinitis due to pollen   . Anemia   . Diabetes mellitus without complication ([facility])   . 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 Obesity, unspecified   . Osteoarthrosis, unspecified whether generalized or localized, lower leg   . Unspecified essential hypertension   . Vitamin B12 deficiency disease 10/28/2008   Qualifier: Diagnosis of  By: Moshe Cipro MD, Joycelyn Schmid     Past Surgical History:  Procedure Laterality Date  . COLONOSCOPY  2008  . EXAM UNDER ANESTHESIA WITH MANIPULATION OF KNEE Right 05/11/2015   Procedure: EXAM UNDER ANESTHESIA WITH MANIPULATION OF KNEE;  Surgeon: Carole Civil, MD;  Location: [facility];  Service:  Orthopedics;  Laterality: Right;  . EYE SURGERY Left 04/27/2018   cataract  . EYE SURGERY Right 06/17/2018   cataract  . GASTRIC BYPASS  2004  . TOTAL KNEE ARTHROPLASTY Right 02/23/2015   Procedure: RIGHT TOTAL KNEE ARTHROPLASTY;  Surgeon: Carole Civil, MD;  Location: [facility];  Service: Orthopedics;  Laterality: Right;  . TOTAL KNEE ARTHROPLASTY Left 03/24/2019   Procedure: TOTAL KNEE ARTHROPLASTY;  Surgeon: Carole Civil, MD;  Location: [facility];  Service: Orthopedics;  Laterality: Left;     SOCIAL HISTORY:  Social History   Socioeconomic History  . Marital status: Single    Spouse name: Not on file  . Number of children: Not on file  . Years of education: Not on file  . Highest education level: Not on file  Occupational History  . Not on file  Tobacco Use  . Smoking status: Never Smoker  . Smokeless tobacco: Never Used  Substance and Sexual Activity  . Alcohol use: No  . Drug use: No  . Sexual activity: Not Currently  Other Topics Concern  . Not on file  Social History Narrative  . Not on file   Social Determinants of Health   Financial Resource Strain:   . Difficulty of Paying Living Expenses: Not on file  Food Insecurity:   . Worried About Charity fundraiser in the Last Year: Not on file  . Ran Out of Food in the Last Year: Not on file  Transportation Needs:   . Lack of Transportation (Medical): Not on file  . Lack of Transportation (  Non-Medical): Not on file  Physical Activity:   . Days of Exercise per Week: Not on file  . Minutes of Exercise per Session: Not on file  Stress:   . Feeling of Stress : Not on file  Social Connections:   . Frequency of Communication with Friends and Family: Not on file  . Frequency of Social Gatherings with Friends and Family: Not on file  . Attends Religious Services: Not on file  . Active Member of Clubs or Organizations: Not on file  . Attends Archivist Meetings: Not on file  . Marital Status: Not on  file  Intimate Partner Violence:   . Fear of Current or Ex-Partner: Not on file  . Emotionally Abused: Not on file  . Physically Abused: Not on file  . Sexually Abused: Not on file    FAMILY HISTORY:  Family History  Problem Relation Age of Onset  . Hypertension Mother   . Hypertension Sister   . Hypertension Sister   . Stroke Sister 4  . Hypertension Sister   . Heart disease Father 54       massive heart attack   . Breast cancer Maternal Aunt   . Lung cancer Maternal Aunt     CURRENT MEDICATIONS:  Outpatient Encounter Medications as of 06/26/2019  Medication Sig  . aspirin 81 MG chewable tablet Chew 1 tablet (81 mg total) by mouth 2 (two) times daily. (Patient taking differently: Chew 81 mg by mouth daily. )  . Calcium Carb-Cholecalciferol (CALCIUM 600+D) 600-800 MG-UNIT TABS Take 2 tablets by mouth daily.  . cholecalciferol (VITAMIN D3) 25 MCG (1000 UT) tablet Take 1,000 Units by mouth daily.  . cyanocobalamin (,VITAMIN B-12,) 1000 MCG/ML injection Inject 1,000 mcg into the muscle every 30 (thirty) days.  Marland Kitchen ELDERBERRY PO Take 50 mg by mouth daily.  . hydrochlorothiazide (HYDRODIURIL) 25 MG tablet TAKE 1 TABLET BY MOUTH EVERY DAY  . Multiple Vitamins-Minerals (CENTRUM ULTRA WOMENS PO) Take 1 tablet by mouth daily.    . potassium chloride (KLOR-CON) 10 MEQ tablet Take 1 tablet (10 mEq total) by mouth daily.  Marland Kitchen amoxicillin (AMOXIL) 500 MG capsule Take 500 mg by mouth 3 (three) times daily. 4 Hours before dental appointment  . celecoxib (CELEBREX) 200 MG capsule Take 1 capsule (200 mg total) by mouth 2 (two) times daily. (Patient not taking: Reported on 05/19/2019)  . cetirizine (ZYRTEC) 10 MG tablet Take 10 mg by mouth daily as needed for allergies.   . pantoprazole (PROTONIX) 20 MG tablet TAKE 1 TABLET BY MOUTH EVERY DAY (Patient not taking: No sig reported)  . polyethylene glycol (MIRALAX / GLYCOLAX) 17 g packet Take 17 g by mouth daily as needed for mild constipation. (Patient  not taking: Reported on 06/24/2019)  . temazepam (RESTORIL) 7.5 MG capsule Take 1 capsule (7.5 mg total) by mouth at bedtime as needed for sleep. (Patient not taking: Reported on 05/19/2019)  . traMADol (ULTRAM) 50 MG tablet Take 1 tablet (50 mg total) by mouth every 6 (six) hours. (Patient not taking: Reported on 06/26/2019)  . [DISCONTINUED] potassium chloride SA (KLOR-CON) 20 MEQ tablet Take 1 tablet (20 mEq total) by mouth 3 (three) times daily.   No facility-administered encounter medications on file as of 06/26/2019.    ALLERGIES:  Allergies  Allergen Reactions  . Milk-Related Compounds     Gas       PHYSICAL EXAM:  ECOG Performance status: 1  Vitals:   06/26/19 0907  BP: 118/76  Pulse:  83  Resp: 19  Temp: (!) 96.4 F (35.8 C)  SpO2: 100%   Filed Weights   06/26/19 0907  Weight: 197 lb 6.4 oz (89.5 kg)    Physical Exam Constitutional:      Appearance: Normal appearance. She is normal weight.  Cardiovascular:     Rate and Rhythm: Normal rate and regular rhythm.     Heart sounds: Normal heart sounds.  Pulmonary:     Effort: Pulmonary effort is normal.     Breath sounds: Normal breath sounds.  Abdominal:     General: Bowel sounds are normal.     Palpations: Abdomen is soft.  Musculoskeletal:        General: Normal range of motion.  Skin:    General: Skin is warm.  Neurological:     Mental Status: She is alert and oriented to person, place, and time. Mental status is at baseline.  Psychiatric:        Mood and Affect: Mood normal.        Behavior: Behavior normal.        Thought Content: Thought content normal.        Judgment: Judgment normal.      LABORATORY DATA:  I have reviewed the labs as listed.  CBC    Component Value Date/Time   WBC 5.3 06/19/2019 1238   RBC 4.47 06/19/2019 1238   HGB 13.5 06/19/2019 1238   HCT 41.3 06/19/2019 1238   PLT 197 06/19/2019 1238   MCV 92.4 06/19/2019 1238   MCH 30.2 06/19/2019 1238   MCHC 32.7 06/19/2019 1238    RDW 15.9 (H) 06/19/2019 1238   LYMPHSABS 2.0 06/19/2019 1238   MONOABS 0.6 06/19/2019 1238   EOSABS 0.2 06/19/2019 1238   BASOSABS 0.0 06/19/2019 1238   CMP Latest Ref Rng & Units 06/19/2019 05/13/2019 03/25/2019  Glucose 70 - 99 mg/dL 83 81 134(H)  BUN 8 - 23 mg/dL 20 12 14   Creatinine 0.44 - 1.00 mg/dL 0.95 0.89 0.72  Sodium 135 - 145 mmol/L 141 141 138  Potassium 3.5 - 5.1 mmol/L 3.2(L) 3.4(L) 3.5  Chloride 98 - 111 mmol/L 104 102 105  CO2 22 - 32 mmol/L 26 27 24   Calcium 8.9 - 10.3 mg/dL 9.3 9.6 8.7(L)  Total Protein 6.5 - 8.1 g/dL 7.3 - -  Total Bilirubin 0.3 - 1.2 mg/dL 0.4 - -  Alkaline Phos 38 - 126 U/L 83 - -  AST 15 - 41 U/L 28 - -  ALT 0 - 44 U/L 20 - -     I personally performed a face-to-face visit.  All questions were answered to patient's stated satisfaction. Encouraged patient to call with any new concerns or questions before his next visit to the cancer center and we can certain see him sooner, if needed.     ASSESSMENT & PLAN:   Vitamin B12 deficiency disease 1.  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 - She remains on monthly B12 injections.  She tolerates these injections very well. -We discussed the option of her administering these injections at home to herself.  She does not feel comfortable at this time. - Labs done on 06/19/2019 showed vitamin B12 level 957 -We will continue the monthly B12 injections. - She will return for follow-up visit in 1 year with labs.      Orders placed this encounter:  Orders Placed This Encounter  Procedures  . Lactate dehydrogenase  . CBC with  Differential/Platelet  . Comprehensive metabolic panel  . Ferritin  . Iron and TIBC  . Vitamin B12  . VITAMIN D 25 Hydroxy (Vit-D Deficiency, Fractures)  . Folate      Francene Finders, FNP-C [place] [phone]

## 2019-06-26 NOTE — Progress Notes (Signed)
..  Sheri Jones presents today for injection per the provider's orders.  B12 administrated without incident; injection site WNL; see MAR for injection details.  Patient tolerated procedure well and without incident.  No questions or complaints noted at this time.

## 2019-06-26 NOTE — Patient Instructions (Signed)
[facility] at [facility] Discharge Instructions  Follow up in 1 year with labs    Thank you for choosing [facility] at [facility] to provide your oncology and hematology care.  To afford each patient quality time with our provider, please arrive at least 15 minutes before your scheduled appointment time.   If you have a lab appointment with the Cancer Center please come in thru the Main Entrance and check in at the main information desk.  You need to re-schedule your appointment should you arrive 10 or more minutes late.  We strive to give you quality time with our providers, and arriving late affects you and other patients whose appointments are after yours.  Also, if you no show three or more times for appointments you may be dismissed from the clinic at the providers discretion.     Again, thank you for choosing [facility].  Our hope is that these requests will decrease the amount of time that you wait before being seen by our physicians.       _____________________________________________________________  Should you have questions after your visit to [facility], please contact our office at [phone] between the hours of 8:00 a.m. and 4:30 p.m.  Voicemails left after 4:00 p.m. will not be returned until the following business day.  For prescription refill requests, have your pharmacy contact our office and allow 72 hours.    Due to Covid, you will need to wear a mask upon entering the hospital. If you do not have a mask, a mask will be given to you at the Main Entrance upon arrival. For doctor visits, patients may have 1 support person with them. For treatment visits, patients can not have anyone with them due to social distancing guidelines and our immunocompromised population.      

## 2019-06-29 ENCOUNTER — Ambulatory Visit: Payer: Federal, State, Local not specified - PPO | Admitting: Family Medicine

## 2019-06-30 ENCOUNTER — Ambulatory Visit: Payer: Federal, State, Local not specified - PPO | Admitting: Physical Therapy

## 2019-07-02 ENCOUNTER — Ambulatory Visit: Payer: Federal, State, Local not specified - PPO | Admitting: Physical Therapy

## 2019-07-07 ENCOUNTER — Encounter: Payer: Federal, State, Local not specified - PPO | Admitting: Physical Therapy

## 2019-07-09 ENCOUNTER — Encounter: Payer: Federal, State, Local not specified - PPO | Admitting: Physical Therapy

## 2019-07-14 ENCOUNTER — Encounter: Payer: Federal, State, Local not specified - PPO | Admitting: Physical Therapy

## 2019-07-16 ENCOUNTER — Encounter: Payer: Federal, State, Local not specified - PPO | Admitting: Physical Therapy

## 2019-07-21 ENCOUNTER — Encounter: Payer: Federal, State, Local not specified - PPO | Admitting: Physical Therapy

## 2019-07-28 ENCOUNTER — Inpatient Hospital Stay (HOSPITAL_COMMUNITY): Payer: Federal, State, Local not specified - PPO | Attending: Hematology

## 2019-07-28 ENCOUNTER — Other Ambulatory Visit: Payer: Self-pay

## 2019-07-28 ENCOUNTER — Encounter (HOSPITAL_COMMUNITY): Payer: Self-pay

## 2019-07-28 VITALS — BP 134/71 | HR 80 | Temp 96.9°F | Resp 18

## 2019-07-28 DIAGNOSIS — E538 Deficiency of other specified B group vitamins: Secondary | ICD-10-CM | POA: Diagnosis present

## 2019-07-28 MED ORDER — CYANOCOBALAMIN 1000 MCG/ML IJ SOLN
1000.0000 ug | Freq: Once | INTRAMUSCULAR | Status: AC
  Administered 2019-07-28: 1000 ug via INTRAMUSCULAR

## 2019-07-28 MED ORDER — CYANOCOBALAMIN 1000 MCG/ML IJ SOLN
INTRAMUSCULAR | Status: AC
  Filled 2019-07-28: qty 1

## 2019-07-28 NOTE — Progress Notes (Signed)
Sheri Jones tolerated Vit B12 injection well without complaints or incident. VSS Pt discharged self ambulatory using her cane in satisfactory condition

## 2019-07-28 NOTE — Patient Instructions (Signed)
[facility] at [facility] Discharge Instructions  Received Vit B12 injection today. Follow-up as scheduled. Call clinic for any questions or concerns   Thank you for choosing [facility] at [facility] to provide your oncology and hematology care.  To afford each patient quality time with our provider, please arrive at least 15 minutes before your scheduled appointment time.   If you have a lab appointment with the Cancer Center please come in thru the Main Entrance and check in at the main information desk.  You need to re-schedule your appointment should you arrive 10 or more minutes late.  We strive to give you quality time with our providers, and arriving late affects you and other patients whose appointments are after yours.  Also, if you no show three or more times for appointments you may be dismissed from the clinic at the providers discretion.     Again, thank you for choosing [facility].  Our hope is that these requests will decrease the amount of time that you wait before being seen by our physicians.       _____________________________________________________________  Should you have questions after your visit to [facility], please contact our office at [phone] between the hours of 8:00 a.m. and 4:30 p.m.  Voicemails left after 4:00 p.m. will not be returned until the following business day.  For prescription refill requests, have your pharmacy contact our office and allow 72 hours.    Due to Covid, you will need to wear a mask upon entering the hospital. If you do not have a mask, a mask will be given to you at the Main Entrance upon arrival. For doctor visits, patients may have 1 support person with them. For treatment visits, patients can not have anyone with them due to social distancing guidelines and our immunocompromised population.     

## 2019-08-18 ENCOUNTER — Ambulatory Visit: Payer: Federal, State, Local not specified - PPO | Admitting: Family Medicine

## 2019-08-27 ENCOUNTER — Other Ambulatory Visit: Payer: Self-pay

## 2019-08-27 ENCOUNTER — Inpatient Hospital Stay (HOSPITAL_COMMUNITY): Payer: Federal, State, Local not specified - PPO | Attending: Hematology

## 2019-08-27 VITALS — BP 135/78 | HR 77 | Temp 97.7°F | Resp 18

## 2019-08-27 DIAGNOSIS — E538 Deficiency of other specified B group vitamins: Secondary | ICD-10-CM | POA: Diagnosis present

## 2019-08-27 MED ORDER — CYANOCOBALAMIN 1000 MCG/ML IJ SOLN
1000.0000 ug | Freq: Once | INTRAMUSCULAR | Status: AC
  Administered 2019-08-27: 1000 ug via INTRAMUSCULAR
  Filled 2019-08-27: qty 1

## 2019-08-27 NOTE — Progress Notes (Signed)
Sheri Jones presents today for injection per MD orders. B12  administered IM in the left Deltoid. Administration without incident. Patient tolerated well.  No complaints at this time. Discharged from clinic ambulatory. F/U with [facility] as scheduled.

## 2019-09-12 ENCOUNTER — Other Ambulatory Visit: Payer: Self-pay | Admitting: Family Medicine

## 2019-09-22 ENCOUNTER — Emergency Department (HOSPITAL_BASED_OUTPATIENT_CLINIC_OR_DEPARTMENT_OTHER)
Admission: EM | Admit: 2019-09-22 | Discharge: 2019-09-22 | Disposition: A | Discharge status: Home / Self Care | Payer: Federal, State, Local not specified - PPO | Attending: Emergency Medicine | Admitting: Emergency Medicine

## 2019-09-22 ENCOUNTER — Other Ambulatory Visit: Payer: Self-pay

## 2019-09-22 ENCOUNTER — Encounter (HOSPITAL_BASED_OUTPATIENT_CLINIC_OR_DEPARTMENT_OTHER): Payer: Self-pay

## 2019-09-22 DIAGNOSIS — E119 Type 2 diabetes mellitus without complications: Secondary | ICD-10-CM | POA: Diagnosis not present

## 2019-09-22 DIAGNOSIS — Z91011 Allergy to milk products: Secondary | ICD-10-CM | POA: Diagnosis not present

## 2019-09-22 DIAGNOSIS — E876 Hypokalemia: Secondary | ICD-10-CM | POA: Diagnosis not present

## 2019-09-22 DIAGNOSIS — Z7982 Long term (current) use of aspirin: Secondary | ICD-10-CM | POA: Insufficient documentation

## 2019-09-22 DIAGNOSIS — Z7984 Long term (current) use of oral hypoglycemic drugs: Secondary | ICD-10-CM | POA: Diagnosis not present

## 2019-09-22 DIAGNOSIS — R197 Diarrhea, unspecified: Secondary | ICD-10-CM | POA: Diagnosis present

## 2019-09-22 DIAGNOSIS — Z79899 Other long term (current) drug therapy: Secondary | ICD-10-CM | POA: Insufficient documentation

## 2019-09-22 LAB — CBC WITH DIFFERENTIAL/PLATELET
Abs Immature Granulocytes: 0.01 10*3/uL (ref 0.00–0.07)
Basophils Absolute: 0 10*3/uL (ref 0.0–0.1)
Basophils Relative: 1 %
Eosinophils Absolute: 0.1 10*3/uL (ref 0.0–0.5)
Eosinophils Relative: 2 %
HCT: 37.4 % (ref 36.0–46.0)
Hemoglobin: 13 g/dL (ref 12.0–15.0)
Immature Granulocytes: 0 %
Lymphocytes Relative: 34 %
Lymphs Abs: 2.2 10*3/uL (ref 0.7–4.0)
MCH: 31.1 pg (ref 26.0–34.0)
MCHC: 34.8 g/dL (ref 30.0–36.0)
MCV: 89.5 fL (ref 80.0–100.0)
Monocytes Absolute: 0.6 10*3/uL (ref 0.1–1.0)
Monocytes Relative: 10 %
Neutro Abs: 3.5 10*3/uL (ref 1.7–7.7)
Neutrophils Relative %: 53 %
Platelets: 177 10*3/uL (ref 150–400)
RBC: 4.18 MIL/uL (ref 3.87–5.11)
RDW: 16.4 % — ABNORMAL HIGH (ref 11.5–15.5)
WBC: 6.5 10*3/uL (ref 4.0–10.5)
nRBC: 0 % (ref 0.0–0.2)

## 2019-09-22 LAB — URINALYSIS, ROUTINE W REFLEX MICROSCOPIC
Bilirubin Urine: NEGATIVE
Glucose, UA: NEGATIVE mg/dL
Ketones, ur: NEGATIVE mg/dL
Nitrite: NEGATIVE
Protein, ur: NEGATIVE mg/dL
Specific Gravity, Urine: <1.005 — ABNORMAL LOW (ref 1.005–1.030)
pH: 5.5 (ref 5.0–8.0)

## 2019-09-22 LAB — BASIC METABOLIC PANEL
Anion gap: 9 (ref 5–15)
BUN: 20 mg/dL (ref 8–23)
CO2: 26 mmol/L (ref 22–32)
Calcium: 8.4 mg/dL — ABNORMAL LOW (ref 8.9–10.3)
Chloride: 97 mmol/L — ABNORMAL LOW (ref 98–111)
Creatinine, Ser: 1.11 mg/dL — ABNORMAL HIGH (ref 0.44–1.00)
GFR calc Af Amer: 58 mL/min — ABNORMAL LOW (ref >60)
GFR calc non Af Amer: 50 mL/min — ABNORMAL LOW (ref >60)
Glucose, Bld: 99 mg/dL (ref 70–99)
Potassium: 3.2 mmol/L — ABNORMAL LOW (ref 3.5–5.1)
Sodium: 132 mmol/L — ABNORMAL LOW (ref 135–145)

## 2019-09-22 LAB — URINALYSIS, MICROSCOPIC (REFLEX): Bacteria, UA: NONE SEEN

## 2019-09-22 LAB — MAGNESIUM: Magnesium: 1.6 mg/dL — ABNORMAL LOW (ref 1.7–2.4)

## 2019-09-22 MED ORDER — POTASSIUM CHLORIDE CRYS ER 20 MEQ PO TBCR
40.0000 meq | EXTENDED_RELEASE_TABLET | Freq: Once | ORAL | Status: AC
  Administered 2019-09-22: 40 meq via ORAL
  Filled 2019-09-22: qty 2

## 2019-09-22 MED ORDER — MAGNESIUM OXIDE 400 (241.3 MG) MG PO TABS
400.0000 mg | ORAL_TABLET | Freq: Once | ORAL | Status: AC
  Administered 2019-09-22: 400 mg via ORAL
  Filled 2019-09-22: qty 1

## 2019-09-22 MED ORDER — LOPERAMIDE HCL 2 MG PO CAPS
2.0000 mg | ORAL_CAPSULE | Freq: Four times a day (QID) | ORAL | 0 refills | Status: DC | PRN
Start: 2019-09-22 — End: 2019-10-07

## 2019-09-22 MED ORDER — SODIUM CHLORIDE 0.9 % IV BOLUS
500.0000 mL | Freq: Once | INTRAVENOUS | Status: AC
  Administered 2019-09-22: 500 mL via INTRAVENOUS

## 2019-09-22 NOTE — Discharge Instructions (Signed)
Please read and follow all provided instructions.  Your diagnoses today include:  1. Diarrhea, unspecified type   2. Hypokalemia   3. Hypomagnesemia     Tests performed today include:  Blood counts and electrolytes -shows just slightly low potassium and magnesium in your blood   Urine test to look for infection  Vital signs. See below for your results today.   Medications prescribed:   Imodium -medication to take if diarrhea persists  Take any prescribed medications only as directed.  Home care instructions:   Follow any educational materials contained in this packet.  Follow-up instructions: Please follow-up with your primary care provider in the next 3 days for further evaluation of your symptoms with your symptoms are not better.    Return instructions:  SEEK IMMEDIATE MEDICAL ATTENTION IF:  The pain does not go away or becomes severe   A temperature above 101F develops   Repeated vomiting occurs (multiple episodes)   The pain becomes localized to portions of the abdomen. The right side could possibly be appendicitis. In an adult, the left lower portion of the abdomen could be colitis or diverticulitis.   Blood is being passed in stools or vomit (bright red or black tarry stools)   You develop chest pain, difficulty breathing, dizziness or fainting, or become confused, poorly responsive, or inconsolable (young children)  If you have any other emergent concerns regarding your health  Additional Information: Abdominal (belly) pain can be caused by many things. Your caregiver performed an examination and possibly ordered blood/urine tests and imaging (CT scan, x-rays, ultrasound). Many cases can be observed and treated at home after initial evaluation in the emergency department. Even though you are being discharged home, abdominal pain can be unpredictable. Therefore, you need a repeated exam if your pain does not resolve, returns, or worsens. Most patients with  abdominal pain don't have to be admitted to the hospital or have surgery, but serious problems like appendicitis and gallbladder attacks can start out as nonspecific pain. Many abdominal conditions cannot be diagnosed in one visit, so follow-up evaluations are very important.  Your vital signs today were: BP (!) 168/74 (BP Location: Left Arm)   Pulse 93   Temp 98.5 F (36.9 C) (Oral)   Resp 18   Ht 5\' 3"  (1.6 m)   Wt 98.4 kg   SpO2 97%   BMI 38.44 kg/m  If your blood pressure (bp) was elevated above 135/85 this visit, please have this repeated by your doctor within one month. --------------

## 2019-09-22 NOTE — ED Notes (Signed)
Pt discharged to home. Discharge instructions have been discussed with patient and/or family members. Pt verbally acknowledges understanding d/c instructions, and endorses comprehension to checkout at registration before leaving.  °

## 2019-09-22 NOTE — ED Provider Notes (Signed)
[facility] EMERGENCY DEPARTMENT Provider Note   CSN: [id number] Arrival date & time: 09/22/19  1418     History Chief Complaint  Patient presents with  . Dehydration    Sheri Jones is a 74 y.o. female.  Patient with history of diabetes, gastric bypass, GERD --presents to the emergency department today for diarrhea.  Patient states that over the past 2 months he has been using "Drink 2 Shrink" detox cleanses.  She takes these after eating meals.  This is a blend of herbal supplements.  She states that this morning she awoke and had 6 episodes of "golden" colored diarrhea without obvious blood.  She was not having any diarrhea yesterday.  She did not have any abdominal pain, nausea or vomiting.  No fevers or chest pain.  No urinary symptoms although she noted that her urine was a dark yellow color.  She states that she then started drinking Pedialyte and Gatorade which caused her urine to become lighter.  She presents today due to the diarrhea and concern that she may develop painful abdominal cramps which she has had in the past.  No other treatments prior to arrival.        Past Medical History:  Diagnosis Date  . Allergic rhinitis due to pollen   . Anemia   . Diabetes mellitus without complication ([place])   . 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 Obesity, unspecified   . Osteoarthrosis, unspecified whether generalized or localized, lower leg   . Unspecified essential hypertension   . Vitamin B12 deficiency disease 10/28/2008   Qualifier: Diagnosis of  By: Moshe Cipro MD, Margaret      Patient Active Problem List   Diagnosis Date Noted  . Insomnia 05/03/2019  . GERD (gastroesophageal reflux disease) 05/03/2019  . Palpitations 04/29/2019  . 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 S/P total knee arthroplasty, 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  . Primary osteoarthritis of left knee   . 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 Osteopenia  08/12/2013  . 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 Prediabetes 08/07/2011  . Vitamin D deficiency 08/07/2011  . Chronic pain of both knees 01/30/2011  . PERSONAL HX COLONIC POLYPS 07/05/2010  . KNEE, ARTHRITIS, DEGEN./OSTEO 01/16/2010  . Vitamin B12 deficiency disease 10/28/2008  . 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 Morbid obesity ([place]) 08/15/2007  . Essential hypertension 08/15/2007    Past Surgical History:  Procedure Laterality Date  . COLONOSCOPY  2008  . EXAM UNDER ANESTHESIA WITH MANIPULATION OF KNEE Right 05/11/2015   Procedure: EXAM UNDER ANESTHESIA WITH MANIPULATION OF KNEE;  Surgeon: Carole Civil, MD;  Location: [facility];  Service: Orthopedics;  Laterality: Right;  . EYE SURGERY Left 04/27/2018   cataract  . EYE SURGERY Right 06/17/2018   cataract  . GASTRIC BYPASS  2004  . TOTAL KNEE ARTHROPLASTY Right 02/23/2015   Procedure: RIGHT TOTAL KNEE ARTHROPLASTY;  Surgeon: Carole Civil, MD;  Location: [facility];  Service: Orthopedics;  Laterality: Right;  . TOTAL KNEE ARTHROPLASTY Left 03/24/2019   Procedure: TOTAL KNEE ARTHROPLASTY;  Surgeon: Carole Civil, MD;  Location: [facility];  Service: Orthopedics;  Laterality: Left;     OB History   No obstetric history on file.     Family History  Problem Relation Age of Onset  . Hypertension Mother   . Hypertension Sister   . Hypertension Sister   . Stroke Sister 56  . Hypertension Sister   . Heart disease Father  68       massive heart attack   . Breast cancer Maternal Aunt   . Lung cancer Maternal Aunt     Social History   Tobacco Use  . Smoking status: Never Smoker  . Smokeless tobacco: Never Used  Substance Use Topics  . Alcohol use: No  . Drug use: No    Home Medications Prior to Admission medications   Medication Sig Start Date End Date Taking? Authorizing Provider  amoxicillin (AMOXIL) 500 MG capsule Take 500 mg by mouth 3 (three) times daily. 4 Hours before dental appointment 06/23/19    [provider]  aspirin 81 MG chewable tablet Chew 1 tablet (81 mg total) by mouth 2 (two) times daily. Patient taking differently: Chew 81 mg by mouth daily.  03/25/19   Carole Civil, MD  Calcium Carb-Cholecalciferol (CALCIUM 600+D) 600-800 MG-UNIT TABS Take 2 tablets by mouth daily.    [provider]  celecoxib (CELEBREX) 200 MG capsule Take 1 capsule (200 mg total) by mouth 2 (two) times daily. Patient not taking: Reported on 05/19/2019 03/25/19   Carole Civil, MD  cetirizine (ZYRTEC) 10 MG tablet Take 10 mg by mouth daily as needed for allergies.     [provider]  cholecalciferol (VITAMIN D3) 25 MCG (1000 UT) tablet Take 1,000 Units by mouth daily.    [provider]  cyanocobalamin (,VITAMIN B-12,) 1000 MCG/ML injection Inject 1,000 mcg into the muscle every 30 (thirty) days.    [provider]  ELDERBERRY PO Take 50 mg by mouth daily.    [provider]  hydrochlorothiazide (HYDRODIURIL) 25 MG tablet TAKE 1 TABLET BY MOUTH EVERY DAY 06/03/19   Fayrene Helper, MD  Multiple Vitamins-Minerals (CENTRUM ULTRA WOMENS PO) Take 1 tablet by mouth daily.      [provider]  pantoprazole (PROTONIX) 20 MG tablet TAKE 1 TABLET BY MOUTH EVERY DAY 11/24/18   Fayrene Helper, MD  polyethylene glycol (MIRALAX / GLYCOLAX) 17 g packet Take 17 g by mouth daily as needed for mild constipation. Patient not taking: Reported on 06/24/2019 03/25/19   Carole Civil, MD  potassium chloride (KLOR-CON) 10 MEQ tablet TAKE 1 TABLET BY MOUTH TWICE A DAY 09/14/19   Fayrene Helper, MD  temazepam (RESTORIL) 7.5 MG capsule Take 1 capsule (7.5 mg total) by mouth at bedtime as needed for sleep. Patient not taking: Reported on 05/19/2019 04/29/19   Fayrene Helper, MD  traMADol (ULTRAM) 50 MG tablet Take 1 tablet (50 mg total) by mouth every 6 (six) hours. Patient not taking: Reported on 06/26/2019 05/18/19   Carole Civil, MD     Allergies    Milk-related compounds  Review of Systems   Review of Systems  Constitutional: Negative for fever.  HENT: Negative for rhinorrhea and sore throat.   Eyes: Negative for redness.  Respiratory: Negative for cough.   Cardiovascular: Negative for chest pain.  Gastrointestinal: Positive for diarrhea. Negative for abdominal pain, blood in stool, constipation, nausea and vomiting.  Genitourinary: Negative for dysuria.  Musculoskeletal: Negative for myalgias.  Skin: Negative for rash.  Neurological: Negative for syncope, light-headedness and headaches.    Physical Exam Updated Vital Signs BP (!) 168/74 (BP Location: Left Arm)   Pulse 93   Temp 98.5 F (36.9 C) (Oral)   Resp 18   Ht 5\' 3"  (1.6 m)   Wt 98.4 kg   SpO2 97%   BMI 38.44 kg/m   Physical Exam  Vitals and nursing note reviewed.  Constitutional:      Appearance: She is well-developed.  HENT:     Head: Normocephalic and atraumatic.     Mouth/Throat:     Mouth: Mucous membranes are moist.  Eyes:     General:        Right eye: No discharge.        Left eye: No discharge.     Conjunctiva/sclera: Conjunctivae normal.  Cardiovascular:     Rate and Rhythm: Normal rate and regular rhythm.     Heart sounds: Normal heart sounds.  Pulmonary:     Effort: Pulmonary effort is normal.     Breath sounds: Normal breath sounds.  Abdominal:     Palpations: Abdomen is soft.     Tenderness: There is no abdominal tenderness. There is no guarding or rebound.  Musculoskeletal:     Cervical back: Normal range of motion and neck supple.  Skin:    General: Skin is warm and dry.  Neurological:     Mental Status: She is alert.     ED Results / Procedures / Treatments   Labs (all labs ordered are listed, but only abnormal results are displayed) Labs Reviewed  BASIC METABOLIC PANEL - Abnormal; Notable for the following components:      Result Value   Sodium 132 (*)    Potassium 3.2 (*)    Chloride 97 (*)     Creatinine, Ser 1.11 (*)    Calcium 8.4 (*)    GFR calc non Af Amer 50 (*)    GFR calc Af Amer 58 (*)    All other components within normal limits  MAGNESIUM - Abnormal; Notable for the following components:   Magnesium 1.6 (*)    All other components within normal limits  CBC WITH DIFFERENTIAL/PLATELET - Abnormal; Notable for the following components:   RDW 16.4 (*)    All other components within normal limits  URINALYSIS, ROUTINE W REFLEX MICROSCOPIC - Abnormal; Notable for the following components:   Color, Urine STRAW (*)    Specific Gravity, Urine <1.005 (*)    Hgb urine dipstick TRACE (*)    Leukocytes,Ua TRACE (*)    All other components within normal limits  URINALYSIS, MICROSCOPIC (REFLEX)    EKG None  Radiology No results found.  Procedures Procedures (including critical care time)  Medications Ordered in ED Medications  sodium chloride 0.9 % bolus 500 mL (0 mLs Intravenous Stopped 09/22/19 1619)  potassium chloride SA (KLOR-CON) CR tablet 40 mEq (40 mEq Oral Given 09/22/19 1638)  magnesium oxide (MAG-OX) tablet 400 mg (400 mg Oral Given 09/22/19 1638)    ED Course  I have reviewed the triage vital signs and the nursing notes.  Pertinent labs & imaging results that were available during my care of the patient were reviewed by me and considered in my medical decision making (see chart for details).  Patient seen and examined.  Given reported diarrhea and history of gastric bypass --we will check CBC, CMP, electrolytes and UA.  Will give fluid bolus.  Overall, patient looks well.  Her abdomen is soft and nontender on exam.  I do not feel that she requires CT imaging at this time.  Vital signs reviewed and are as follows: BP (!) 168/74 (BP Location: Left Arm)   Pulse 93   Temp 98.5 F (36.9 C) (Oral)   Resp 18   Ht 5\' 3"  (1.6 m)   Wt 98.4 kg   SpO2 97%  BMI 38.44 kg/m   4:41 PM patient continues to do well.  She has not had any further episodes of diarrhea  while in the emergency department.  She was given 1 dose of supplemental potassium and magnesium given slightly low counts.  Patient will be discharged home with prescription for Imodium to use as needed.  She states that she is feeling well and comfortable with plan for discharge.  The patient was urged to return to the Emergency Department immediately with worsening of current symptoms, worsening abdominal pain, persistent vomiting, blood noted in stools, fever, or any other concerns. The patient verbalized understanding.      MDM Rules/Calculators/A&P                      Patient presents the emergency department with several episodes of watery, nonbloody diarrhea this morning.  This has not been associated with fever or pain.  No blood passage in the stool.  Abdomen is soft and nontender on exam.  Patient has slightly low potassium, sodium, magnesium.  She was treated with IV fluids in the emergency department.  Her symptoms are controlled.  She appears well, nontoxic.  Will treat symptomatically.  Return instructions as above ending patient is encouraged to follow-up with PCP if symptoms persist.   Final Clinical Impression(s) / ED Diagnoses Final diagnoses:  Diarrhea, unspecified type  Hypokalemia  Hypomagnesemia    Rx / DC Orders ED Discharge Orders         Ordered    loperamide (IMODIUM) 2 MG capsule  4 times daily PRN     09/22/19 1640           Carlisle Cater, PA-C 09/22/19 1642    Virgel Manifold, MD 09/22/19 1650

## 2019-09-22 NOTE — ED Triage Notes (Signed)
Pt arrives with c/o diarrhea reports she thinks she may be dehydrated because she has been using "a drink to shrink" for weight loss and not drinking a lot of water.

## 2019-09-23 ENCOUNTER — Ambulatory Visit: Payer: Federal, State, Local not specified - PPO | Admitting: Orthopedic Surgery

## 2019-09-25 ENCOUNTER — Inpatient Hospital Stay (HOSPITAL_COMMUNITY): Payer: Federal, State, Local not specified - PPO | Attending: Hematology

## 2019-09-25 ENCOUNTER — Encounter (HOSPITAL_COMMUNITY): Payer: Self-pay

## 2019-09-25 ENCOUNTER — Other Ambulatory Visit: Payer: Self-pay

## 2019-09-25 VITALS — BP 128/92 | HR 72 | Temp 97.5°F | Resp 16

## 2019-09-25 DIAGNOSIS — E538 Deficiency of other specified B group vitamins: Secondary | ICD-10-CM | POA: Insufficient documentation

## 2019-09-25 MED ORDER — CYANOCOBALAMIN 1000 MCG/ML IJ SOLN
1000.0000 ug | Freq: Once | INTRAMUSCULAR | Status: AC
  Administered 2019-09-25: 1000 ug via INTRAMUSCULAR
  Filled 2019-09-25: qty 1

## 2019-09-25 NOTE — Progress Notes (Signed)
Sheri Jones presents today for injection per the provider's orders.  B12 administration without incident; injection site WNL; see MAR for injection details.  Patient tolerated procedure well and without incident.  No questions or complaints noted at this time. Pt d/c clinic ambulatory.

## 2019-10-07 ENCOUNTER — Encounter: Payer: Self-pay | Admitting: Orthopedic Surgery

## 2019-10-07 ENCOUNTER — Other Ambulatory Visit: Payer: Self-pay

## 2019-10-07 ENCOUNTER — Ambulatory Visit: Payer: Federal, State, Local not specified - PPO | Admitting: Orthopedic Surgery

## 2019-10-07 VITALS — BP 127/74 | HR 77 | Ht 63.0 in | Wt 215.0 lb

## 2019-10-07 DIAGNOSIS — Z96652 Presence of left artificial knee joint: Secondary | ICD-10-CM

## 2019-10-07 NOTE — Progress Notes (Signed)
  Chief Complaint  Patient presents with  . Post-op Follow-up    patient states she is improving, has to use arms to pull up out of chair     Sheri Jones has again developed arthrofibrosis on the left knee as she did on the right  She had a right knee manipulation  Still has limited flexion's in both knees  We do note that she is able to function fairly well can get out of a chair rather easily her get up and go test was reasonably good.  Her knee flexion is only about 70 degrees however.  She does reach full extension  She is not interested in any more surgery at this time and will follow up in a year  Chronic problem not stable

## 2019-10-17 ENCOUNTER — Other Ambulatory Visit: Payer: Self-pay | Admitting: Family Medicine

## 2019-10-23 ENCOUNTER — Inpatient Hospital Stay (HOSPITAL_COMMUNITY): Payer: Federal, State, Local not specified - PPO | Attending: Hematology

## 2019-10-23 ENCOUNTER — Encounter (HOSPITAL_COMMUNITY): Payer: Self-pay

## 2019-10-23 VITALS — BP 127/79 | HR 73 | Temp 97.8°F | Resp 18

## 2019-10-23 DIAGNOSIS — E538 Deficiency of other specified B group vitamins: Secondary | ICD-10-CM | POA: Diagnosis present

## 2019-10-23 MED ORDER — CYANOCOBALAMIN 1000 MCG/ML IJ SOLN
INTRAMUSCULAR | Status: AC
  Filled 2019-10-23: qty 1

## 2019-10-23 MED ORDER — CYANOCOBALAMIN 1000 MCG/ML IJ SOLN
1000.0000 ug | Freq: Once | INTRAMUSCULAR | Status: AC
  Administered 2019-10-23: 1000 ug via INTRAMUSCULAR

## 2019-10-27 ENCOUNTER — Other Ambulatory Visit: Payer: Self-pay

## 2019-10-27 ENCOUNTER — Ambulatory Visit (INDEPENDENT_AMBULATORY_CARE_PROVIDER_SITE_OTHER): Payer: Federal, State, Local not specified - PPO | Admitting: *Deleted

## 2019-10-27 VITALS — BP 127/79 | Ht 63.0 in | Wt 215.0 lb

## 2019-10-27 DIAGNOSIS — M545 Low back pain, unspecified: Secondary | ICD-10-CM

## 2019-10-27 LAB — POCT URINALYSIS DIP (CLINITEK)
Bilirubin, UA: NEGATIVE
Blood, UA: NEGATIVE
Glucose, UA: NEGATIVE mg/dL
Ketones, POC UA: NEGATIVE mg/dL
Leukocytes, UA: NEGATIVE
Nitrite, UA: NEGATIVE
POC PROTEIN,UA: NEGATIVE
Spec Grav, UA: 1.02 (ref 1.010–1.025)
Urobilinogen, UA: 0.2 E.U./dL
pH, UA: 5 (ref 5.0–8.0)

## 2019-10-27 NOTE — Progress Notes (Signed)
Pt left urine due to lower back pain

## 2019-10-28 ENCOUNTER — Encounter: Payer: Self-pay | Admitting: *Deleted

## 2019-10-28 ENCOUNTER — Telehealth (INDEPENDENT_AMBULATORY_CARE_PROVIDER_SITE_OTHER): Payer: Federal, State, Local not specified - PPO | Admitting: Family Medicine

## 2019-10-28 ENCOUNTER — Encounter: Payer: Self-pay | Admitting: Family Medicine

## 2019-10-28 VITALS — BP 127/79 | Ht 63.0 in | Wt 215.0 lb

## 2019-10-28 DIAGNOSIS — M545 Low back pain, unspecified: Secondary | ICD-10-CM | POA: Insufficient documentation

## 2019-10-28 NOTE — Patient Instructions (Addendum)
I appreciate the opportunity to provide you with care for your health and wellness. Today we discussed:   Follow up: 11/04/2019 as scheduled   No labs or referrals today  Glad you are feeling better. Walk and stretch out your back as you can. Not using our backs can make them stiff and achy.  Please continue to practice social distancing to keep you, your family, and our community safe.  If you must go out, please wear a mask and practice good handwashing.  It was a pleasure to see you and I look forward to continuing to work together on your health and well-being. Please do not hesitate to call the office if you need care or have questions about your care.  Have a wonderful day and week. With Gratitude, Cherly Beach, DNP, AGNP-BC

## 2019-10-28 NOTE — Assessment & Plan Note (Signed)
She is educated on mobility needs.  Possible physical therapy if she is unable to get her back back to feeling better.  With stretches that are safe to perform.  And walking more frequently.  She is encouraged not to sit for long periods of time.  Did do a urine dip to be on the safe side negative in office

## 2019-10-28 NOTE — Progress Notes (Signed)
Virtual Visit via Telephone Note   This visit type was conducted due to national recommendations for restrictions regarding the COVID-19 Pandemic (e.g. social distancing) in an effort to limit this patient's exposure and mitigate transmission in our community.  Due to her co-morbid illnesses, this patient is at least at moderate risk for complications without adequate follow up.  This format is felt to be most appropriate for this patient at this time.  The patient did not have access to video technology/had technical difficulties with video requiring transitioning to audio format only (telephone).  All issues noted in this document were discussed and addressed.  No physical exam could be performed with this format.    Evaluation Performed:  Follow-up visit  Date:  10/28/2019   ID:  Sheri Jones, DOB 1945-05-12, MRN [id number]  Patient Location: Home Provider Location: Office  Location of Patient: Home Location of Provider: Telehealth Consent was obtain for visit to be over via telehealth. I verified that I am speaking with the correct person using two identifiers.  PCP:  Fayrene Helper, MD   Chief Complaint: Low back pain  History of Present Illness:    Sheri Jones is a 74 y.o. female with low back pain, no urgency, no frequency, no burning with urination.  No pressure or full sensation in the pelvic area.  Just reports some low back pain that comes and goes that started last Thursday in particular.  She reports that it is eased up a lot since she been drinking water and cranberry juice. Did have knee surgery late last year.  Reports that she never got back into the immobile since then.  She reports discomfort when she goes from sitting to standing at times.  She feels it predominantly in her lower back.  She has not done any stretching or physical therapy in some time.  She reports her mattress is doing well.  Her shoes are doing okay. She denies any chest pain, palpitations,  cough, shortness of breath, fever, chills  The patient does not have symptoms concerning for COVID-19 infection (fever, chills, cough, or new shortness of breath).   Past Medical, Surgical, Social History, Allergies, and Medications have been Reviewed.  Past Medical History:  Diagnosis Date  . Allergic rhinitis due to pollen   . Anemia   . Diabetes mellitus without complication ([place])   . 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 Obesity, unspecified   . Osteoarthrosis, unspecified whether generalized or localized, lower leg   . Unspecified essential hypertension   . Vitamin B12 deficiency disease 10/28/2008   Qualifier: Diagnosis of  By: Moshe Cipro MD, Joycelyn Schmid     Past Surgical History:  Procedure Laterality Date  . COLONOSCOPY  2008  . EXAM UNDER ANESTHESIA WITH MANIPULATION OF KNEE Right 05/11/2015   Procedure: EXAM UNDER ANESTHESIA WITH MANIPULATION OF KNEE;  Surgeon: Carole Civil, MD;  Location: [facility];  Service: Orthopedics;  Laterality: Right;  . EYE SURGERY Left 04/27/2018   cataract  . EYE SURGERY Right 06/17/2018   cataract  . GASTRIC BYPASS  2004  . TOTAL KNEE ARTHROPLASTY Right 02/23/2015   Procedure: RIGHT TOTAL KNEE ARTHROPLASTY;  Surgeon: Carole Civil, MD;  Location: [facility];  Service: Orthopedics;  Laterality: Right;  . TOTAL KNEE ARTHROPLASTY Left 03/24/2019   Procedure: TOTAL KNEE ARTHROPLASTY;  Surgeon: Carole Civil, MD;  Location: [facility];  Service: Orthopedics;  Laterality: Left;     Current Meds  Medication Sig  .  Calcium Carb-Cholecalciferol (CALCIUM 600+D) 600-800 MG-UNIT TABS Take 2 tablets by mouth daily.  . cetirizine (ZYRTEC) 10 MG tablet Take 10 mg by mouth daily as needed for allergies.   . cholecalciferol (VITAMIN D3) 25 MCG (1000 UT) tablet Take 1,000 Units by mouth daily.  . cyanocobalamin (,VITAMIN B-12,) 1000 MCG/ML injection Inject 1,000 mcg into the muscle every 30 (thirty) days.  . hydrochlorothiazide (HYDRODIURIL) 25 MG  tablet TAKE 1 TABLET BY MOUTH EVERY DAY  . Multiple Vitamins-Minerals (CENTRUM ULTRA WOMENS PO) Take 1 tablet by mouth daily.    . pantoprazole (PROTONIX) 20 MG tablet TAKE 1 TABLET BY MOUTH EVERY DAY  . potassium chloride (KLOR-CON) 10 MEQ tablet TAKE 1 TABLET BY MOUTH TWICE A DAY     Allergies:   Milk-related compounds   ROS:   Please see the history of present illness.    All other systems reviewed and are negative.   Labs/Other Tests and Data Reviewed:    Recent Labs: 02/12/2019: TSH 1.12 06/19/2019: ALT 20 09/22/2019: BUN 20; Creatinine, Ser 1.11; Hemoglobin 13.0; Magnesium 1.6; Platelets 177; Potassium 3.2; Sodium 132   Recent Lipid Panel Lab Results  Component Value Date/Time   CHOL 188 02/12/2019 08:14 AM   TRIG 63 02/12/2019 08:14 AM   HDL 73 02/12/2019 08:14 AM   CHOLHDL 2.6 02/12/2019 08:14 AM   LDLCALC 100 (H) 02/12/2019 08:14 AM    Wt Readings from Last 3 Encounters:  10/28/19 215 lb (97.5 kg)  10/28/19 215 lb (97.5 kg)  10/07/19 215 lb (97.5 kg)     Objective:    Vital Signs:  BP 127/79   Ht 5\' 3"  (1.6 m)   Wt 215 lb (97.5 kg)   BMI 38.09 kg/m    VITAL SIGNS:  reviewed GEN:  alert and oriented RESPIRATORY:  no shortness of breath in conversation  PSYCH:  normal affect and mood   ASSESSMENT & PLAN:    1. Acute bilateral low back pain without sciatica   Time:   Today, I have spent 10 minutes with the patient with telehealth technology discussing the above problems.     Medication Adjustments/Labs and Tests Ordered: Current medicines are reviewed at length with the patient today.  Concerns regarding medicines are outlined above.   Tests Ordered: No orders of the defined types were placed in this encounter.   Medication Changes: No orders of the defined types were placed in this encounter.   Disposition:  Follow up as scheduled Signed, Perlie Mayo, NP  10/28/2019 10:50 AM     [place]

## 2019-11-04 ENCOUNTER — Ambulatory Visit (INDEPENDENT_AMBULATORY_CARE_PROVIDER_SITE_OTHER): Payer: Federal, State, Local not specified - PPO | Admitting: Family Medicine

## 2019-11-04 ENCOUNTER — Encounter: Payer: Self-pay | Admitting: Family Medicine

## 2019-11-04 ENCOUNTER — Other Ambulatory Visit: Payer: Self-pay

## 2019-11-04 VITALS — BP 133/72 | HR 66 | Resp 15 | Ht 63.0 in | Wt 203.0 lb

## 2019-11-04 DIAGNOSIS — J302 Other seasonal allergic rhinitis: Secondary | ICD-10-CM

## 2019-11-04 DIAGNOSIS — I10 Essential (primary) hypertension: Secondary | ICD-10-CM | POA: Diagnosis not present

## 2019-11-04 DIAGNOSIS — E559 Vitamin D deficiency, unspecified: Secondary | ICD-10-CM

## 2019-11-04 MED ORDER — POTASSIUM CHLORIDE ER 10 MEQ PO TBCR: 10.0000 meq | EXTENDED_RELEASE_TABLET | Freq: Two times a day (BID) | ORAL | 1 refills | Status: DC

## 2019-11-04 NOTE — Patient Instructions (Addendum)
Annual physical exam with MD early [place], call if you need me before  Fasting lipid, TSH and chem 7 and EGFR 1 wek before appointment   It is important that you exercise regularly at least 30 minutes 5 times a week. If you develop chest pain, have severe difficulty breathing, or feel very tired, stop exercising immediately and seek medical attention   Think about what you will eat, plan ahead. Choose " clean, green, fresh or frozen" over canned, processed or packaged foods which are more sugary, salty and fatty. 70 to 75% of food eaten should be vegetables and fruit. Three meals at set times with snacks allowed between meals, but they must be fruit or vegetables. Aim to eat over a 12 hour period , example 7 am to 7 pm, and STOP after  your last meal of the day. Drink water,generally about 64 ounces per day, no other drink is as healthy. Fruit juice is best enjoyed in a healthy way, by EATING the fruit.

## 2019-11-06 ENCOUNTER — Encounter: Payer: Self-pay | Admitting: Family Medicine

## 2019-11-06 DIAGNOSIS — J302 Other seasonal allergic rhinitis: Secondary | ICD-10-CM | POA: Insufficient documentation

## 2019-11-06 NOTE — Assessment & Plan Note (Signed)
Controlled, no change in medication DASH diet and commitment to daily physical activity for a minimum of 30 minutes discussed and encouraged, as a part of hypertension management. The importance of attaining a healthy weight is also discussed.  BP/Weight 11/04/2019 10/28/2019 10/27/2019 10/23/2019 10/07/2019 12/29/9890 04/24/9415  Systolic BP 408 144 818 563 149 702 637  Diastolic BP 72 79 79 79 74 92 71  Wt. (Lbs) 203 215 215 - 215 - 217  BMI 35.96 38.09 38.09 - 38.09 - 38.44

## 2019-11-06 NOTE — Assessment & Plan Note (Signed)
Obesity linked with hypertension and arthritis.  Patient re-educated about  the importance of commitment to a  minimum of 150 minutes of exercise per week as able.  The importance of healthy food choices with portion control discussed, as well as eating regularly and within a 12 hour window most days. The need to choose "clean , green" food 50 to 75% of the time is discussed, as well as to make water the primary drink and set a goal of 64 ounces water daily.    Weight /BMI 11/04/2019 10/28/2019 10/27/2019  WEIGHT 203 lb 215 lb 215 lb  HEIGHT 5\' 3"  5\' 3"  5\' 3"   BMI 35.96 kg/m2 38.09 kg/m2 38.09 kg/m2   Improved, she is applauded on this and encouraged to continue same

## 2019-11-06 NOTE — Assessment & Plan Note (Signed)
Controlled, no change in medication  

## 2019-11-06 NOTE — Progress Notes (Signed)
   Sheri Jones     MRN: [id number]      DOB: 07-13-1945   HPI Sheri Jones is here for follow up and re-evaluation of chronic medical conditions, medication management and review of any available recent lab and radiology data.  Preventive health is updated, specifically  Cancer screening and Immunization.   Questions or concerns regarding consultations or procedures which the PT has had in the interim are  addressed. The PT denies any adverse reactions to current medications since the last visit.  There are no new concerns.  There are no specific complaints   ROS Denies recent fever or chills. Denies sinus pressure, nasal congestion, ear pain or sore throat. Denies chest congestion, productive cough or wheezing. Denies chest pains, palpitations and leg swelling Denies abdominal pain, nausea, vomiting,diarrhea or constipation.   Denies dysuria, frequency, hesitancy or incontinence. Denies joint pain, swelling and limitation in mobility. Denies headaches, seizures, numbness, or tingling. Denies depression, anxiety or insomnia. Denies skin break down or rash.   PE  BP 133/72   Pulse 66   Resp 15   Ht 5\' 3"  (1.6 m)   Wt 203 lb (92.1 kg)   SpO2 97%   BMI 35.96 kg/m   Patient alert and oriented and in no cardiopulmonary distress.  HEENT: No facial asymmetry, EOMI,     Neck supple .  Chest: Clear to auscultation bilaterally.  CVS: S1, S2 no murmurs, no S3.Regular rate.  ABD: Soft non tender.   Ext: No edema   MS:  Reduced in  Knees.Adequate in shoulders, hips and spine  Skin: Intact, no ulcerations or rash noted.  Psych: Good eye contact, normal affect. Memory intact not anxious or depressed appearing.  CNS: CN 2-12 intact, power,  normal throughout.no focal deficits noted.   Assessment & Plan  Essential hypertension Controlled, no change in medication DASH diet and commitment to daily physical activity for a minimum of 30 minutes discussed and encouraged, as a  part of hypertension management. The importance of attaining a healthy weight is also discussed.  BP/Weight 11/04/2019 10/28/2019 10/27/2019 10/23/2019 10/07/2019 4/0/3474 05/28/9561  Systolic BP 875 643 329 518 841 660 630  Diastolic BP 72 79 79 79 74 92 71  Wt. (Lbs) 203 215 215 - 215 - 217  BMI 35.96 38.09 38.09 - 38.09 - 38.44       Morbid obesity (HCC) Obesity linked with hypertension and arthritis.  Patient re-educated about  the importance of commitment to a  minimum of 150 minutes of exercise per week as able.  The importance of healthy food choices with portion control discussed, as well as eating regularly and within a 12 hour window most days. The need to choose "clean , green" food 50 to 75% of the time is discussed, as well as to make water the primary drink and set a goal of 64 ounces water daily.    Weight /BMI 11/04/2019 10/28/2019 10/27/2019  WEIGHT 203 lb 215 lb 215 lb  HEIGHT 5\' 3"  5\' 3"  5\' 3"   BMI 35.96 kg/m2 38.09 kg/m2 38.09 kg/m2   Improved, she is applauded on this and encouraged to continue same    Acute bilateral low back pain without sciatica Controlled, no change in medication   Chronic seasonal allergic rhinitis Controlled, no change in medication

## 2019-11-23 ENCOUNTER — Encounter (HOSPITAL_COMMUNITY): Payer: Self-pay

## 2019-11-23 ENCOUNTER — Inpatient Hospital Stay (HOSPITAL_COMMUNITY): Payer: Federal, State, Local not specified - PPO | Attending: Hematology

## 2019-11-23 ENCOUNTER — Other Ambulatory Visit: Payer: Self-pay

## 2019-11-23 VITALS — BP 136/75 | HR 79 | Temp 97.1°F | Resp 18

## 2019-11-23 DIAGNOSIS — E538 Deficiency of other specified B group vitamins: Secondary | ICD-10-CM | POA: Diagnosis not present

## 2019-11-23 MED ORDER — CYANOCOBALAMIN 1000 MCG/ML IJ SOLN
1000.0000 ug | Freq: Once | INTRAMUSCULAR | Status: AC
  Administered 2019-11-23: 1000 ug via INTRAMUSCULAR
  Filled 2019-11-23: qty 1

## 2019-11-23 NOTE — Progress Notes (Signed)
Patient tolerated B12 injection with no complaints voiced.  Site clean and dry with no bruising or swelling noted at site.  Band aid applied.  VSS with discharge and left ambulatory with no s/s of distress noted. 

## 2019-12-17 ENCOUNTER — Other Ambulatory Visit (HOSPITAL_COMMUNITY): Payer: Self-pay | Admitting: Family Medicine

## 2019-12-17 DIAGNOSIS — Z1231 Encounter for screening mammogram for malignant neoplasm of breast: Secondary | ICD-10-CM

## 2019-12-24 ENCOUNTER — Other Ambulatory Visit: Payer: Self-pay

## 2019-12-24 ENCOUNTER — Inpatient Hospital Stay (HOSPITAL_COMMUNITY): Payer: Federal, State, Local not specified - PPO | Attending: Hematology

## 2019-12-24 ENCOUNTER — Encounter (HOSPITAL_COMMUNITY): Payer: Self-pay

## 2019-12-24 VITALS — BP 141/75 | HR 72 | Temp 96.8°F | Resp 17

## 2019-12-24 DIAGNOSIS — E538 Deficiency of other specified B group vitamins: Secondary | ICD-10-CM | POA: Diagnosis present

## 2019-12-24 MED ORDER — CYANOCOBALAMIN 1000 MCG/ML IJ SOLN
1000.0000 ug | Freq: Once | INTRAMUSCULAR | Status: AC
  Administered 2019-12-24: 1000 ug via INTRAMUSCULAR

## 2019-12-24 MED ORDER — CYANOCOBALAMIN 1000 MCG/ML IJ SOLN
INTRAMUSCULAR | Status: AC
  Filled 2019-12-24: qty 1

## 2019-12-24 NOTE — Progress Notes (Signed)
Pt here today for monthly B12 injection. Pt given injection in left deltoid. Pt tolerated injection well with no complaints. Pt stable and discharged home ambulatory. Pt to return as scheduled.

## 2020-01-04 ENCOUNTER — Other Ambulatory Visit: Payer: Self-pay | Admitting: Family Medicine

## 2020-01-04 ENCOUNTER — Telehealth: Payer: Self-pay

## 2020-01-04 NOTE — Telephone Encounter (Signed)
Her June 1 lab has low potassium She needs to take it twice daily and repeat non fast chem 7 and EGFR, in 2 weeks, please order and explain.  Unless that blood test shows abnormal potassium, she stays on two times daily, we will let her know the result in 2 weeks after the repeat blood draw

## 2020-01-04 NOTE — Telephone Encounter (Signed)
Patient wants to know if she is supposed to be taking potassium once or twice daily. She said in May she was told to take it once daily and now on her med list it says twice daily and I told her I would let her know. Please advise

## 2020-01-05 ENCOUNTER — Other Ambulatory Visit: Payer: Self-pay

## 2020-01-05 DIAGNOSIS — I10 Essential (primary) hypertension: Secondary | ICD-10-CM

## 2020-01-05 NOTE — Telephone Encounter (Signed)
Called patient and left message for them to return call at the office   

## 2020-01-18 ENCOUNTER — Other Ambulatory Visit: Payer: Self-pay

## 2020-01-18 ENCOUNTER — Ambulatory Visit (HOSPITAL_COMMUNITY)
Admission: RE | Admit: 2020-01-18 | Discharge: 2020-01-18 | Disposition: A | Discharge status: Home / Self Care | Payer: Federal, State, Local not specified - PPO | Source: Ambulatory Visit | Attending: Family Medicine | Admitting: Family Medicine

## 2020-01-18 DIAGNOSIS — Z1231 Encounter for screening mammogram for malignant neoplasm of breast: Secondary | ICD-10-CM

## 2020-01-19 LAB — BMP8+EGFR
BUN/Creatinine Ratio: 26 (ref 12–28)
BUN: 20 mg/dL (ref 8–27)
CO2: 24 mmol/L (ref 20–29)
Calcium: 9.1 mg/dL (ref 8.7–10.3)
Chloride: 107 mmol/L — ABNORMAL HIGH (ref 96–106)
Creatinine, Ser: 0.76 mg/dL (ref 0.57–1.00)
GFR calc Af Amer: 91 mL/min/{1.73_m2} (ref >59)
GFR calc non Af Amer: 79 mL/min/{1.73_m2} (ref >59)
Glucose: 80 mg/dL (ref 65–99)
Potassium: 3.8 mmol/L (ref 3.5–5.2)
Sodium: 144 mmol/L (ref 134–144)

## 2020-01-20 ENCOUNTER — Encounter: Payer: Self-pay | Admitting: Family Medicine

## 2020-01-22 ENCOUNTER — Other Ambulatory Visit: Payer: Self-pay

## 2020-01-22 ENCOUNTER — Inpatient Hospital Stay (HOSPITAL_COMMUNITY): Payer: Federal, State, Local not specified - PPO | Attending: Hematology

## 2020-01-22 VITALS — BP 142/67 | HR 72 | Temp 97.2°F | Resp 18

## 2020-01-22 DIAGNOSIS — E538 Deficiency of other specified B group vitamins: Secondary | ICD-10-CM | POA: Diagnosis not present

## 2020-01-22 MED ORDER — CYANOCOBALAMIN 1000 MCG/ML IJ SOLN
1000.0000 ug | Freq: Once | INTRAMUSCULAR | Status: AC
  Administered 2020-01-22: 1000 ug via INTRAMUSCULAR
  Filled 2020-01-22: qty 1

## 2020-01-22 NOTE — Progress Notes (Signed)
Patient tolerated B12 injection with no complaints voiced.  Site clean and dry with no bruising or swelling noted.  No complaints of pain.  Discharged with vital signs stable and no signs or symptoms of distress noted.

## 2020-01-29 LAB — BASIC METABOLIC PANEL WITH GFR
BUN: 17 mg/dL (ref 7–25)
CO2: 28 mmol/L (ref 20–32)
Calcium: 9.4 mg/dL (ref 8.6–10.4)
Chloride: 106 mmol/L (ref 98–110)
Creat: 0.8 mg/dL (ref 0.60–0.93)
GFR, Est African American: 86 mL/min/{1.73_m2} (ref >=60)
GFR, Est Non African American: 74 mL/min/{1.73_m2} (ref >=60)
Glucose, Bld: 81 mg/dL (ref 65–99)
Potassium: 3.8 mmol/L (ref 3.5–5.3)
Sodium: 142 mmol/L (ref 135–146)

## 2020-01-29 LAB — LIPID PANEL
Cholesterol: 196 mg/dL (ref <200)
HDL: 75 mg/dL (ref >=50)
LDL Cholesterol (Calc): 105 mg/dL (calc) — ABNORMAL HIGH
Non-HDL Cholesterol (Calc): 121 mg/dL (calc) (ref <130)
Total CHOL/HDL Ratio: 2.6 (calc) (ref <5.0)
Triglycerides: 72 mg/dL (ref <150)

## 2020-01-29 LAB — TSH: TSH: 1.39 mIU/L (ref 0.40–4.50)

## 2020-02-04 ENCOUNTER — Encounter: Payer: Self-pay | Admitting: Family Medicine

## 2020-02-04 ENCOUNTER — Other Ambulatory Visit: Payer: Self-pay

## 2020-02-04 ENCOUNTER — Ambulatory Visit (INDEPENDENT_AMBULATORY_CARE_PROVIDER_SITE_OTHER): Payer: Federal, State, Local not specified - PPO | Admitting: Family Medicine

## 2020-02-04 VITALS — BP 122/80 | HR 78 | Temp 97.8°F | Resp 18 | Ht 63.0 in | Wt 190.0 lb

## 2020-02-04 DIAGNOSIS — Z0000 Encounter for general adult medical examination without abnormal findings: Secondary | ICD-10-CM | POA: Diagnosis not present

## 2020-02-04 DIAGNOSIS — M1712 Unilateral primary osteoarthritis, left knee: Secondary | ICD-10-CM

## 2020-02-04 DIAGNOSIS — M25662 Stiffness of left knee, not elsewhere classified: Secondary | ICD-10-CM

## 2020-02-04 DIAGNOSIS — Z780 Asymptomatic menopausal state: Secondary | ICD-10-CM

## 2020-02-04 DIAGNOSIS — Z23 Encounter for immunization: Secondary | ICD-10-CM | POA: Diagnosis not present

## 2020-02-04 DIAGNOSIS — I10 Essential (primary) hypertension: Secondary | ICD-10-CM

## 2020-02-04 NOTE — Progress Notes (Signed)
° ° °  Sheri Jones     MRN: [id number]      DOB: January 18, 1946  HPI: Patient is in for annual physical exam. C/o persistent knee stiffness post op and is rferred back to Ortho Recent labs, are reviewed. Immunization is reviewed , and  updated    PE: BP 122/80    Pulse 78    Temp 97.8 F (36.6 C)    Resp 18    Ht 5\' 3"  (1.6 m)    Wt 190 lb (86.2 kg)    SpO2 98%    BMI 33.66 kg/m   Pleasant  female, alert and oriented x 3, in no cardio-pulmonary distress. Afebrile. HEENT No facial trauma or asymetry. Sinuses non tender.  Extra occullar muscles intact.. External ears normal, . Neck: supple, no adenopathy,JVD or thyromegaly.No bruits.  Chest: Clear to ascultation bilaterally.No crackles or wheezes. Non tender to palpation  Breast: No asymetry,no masses or lumps. No tenderness. No nipple discharge or inversion. No axillary or supraclavicular adenopathy  Cardiovascular system; Heart sounds normal,  S1 and  S2 ,no S3.  No murmur, or thrill. Apical beat not displaced Peripheral pulses normal.  Abdomen: Soft, non tender, no organomegaly or masses. No bruits. Bowel sounds normal. No guarding, tenderness or rebound.     Musculoskeletal exam: Decreased  ROM of spine, hips , shoulders and markedly reduced in left  knee.  deformity ,swelling and  crepitus noted. No muscle wasting or atrophy.   Neurologic: Cranial nerves 2 to 12 intact. Power, tone ,sensation and reflexes normal throughout.  disturbance in gait. No tremor.  Skin: Intact, no ulceration, erythema , scaling or rash noted. Pigmentation normal throughout  Psych; Normal mood and affect. Judgement and concentration normal   Assessment & Plan:  Annual physical exam Annual exam as documented. Counseling done  re healthy lifestyle involving commitment to 150 minutes exercise per week, heart healthy diet, and attaining healthy weight.The importance of adequate sleep also discussed. Regular seat belt use and home  safety, is also discussed. Changes in health habits are decided on by the patient with goals and time frames  set for achieving them. Immunization and cancer screening needs are specifically addressed at this visit.   Primary osteoarthritis of left knee C/o persitent pain and stiffness post TKA refer to Ortho

## 2020-02-04 NOTE — Assessment & Plan Note (Signed)

## 2020-02-04 NOTE — Patient Instructions (Addendum)
Office with MD in 6 months, call if you need me sooner  Flu vaccine today  Fasting chem 7 and EGFR first week in April  Congrats on weight loss trough dietary change, keep it up   You are referred for bone density test  Please stop aspirin   You are referred to Dr Aline Brochure  Next colonoscopy with Dr Gala Romney is due in March 2022  Think about what you will eat, plan ahead. Choose " clean, green, fresh or frozen" over canned, processed or packaged foods which are more sugary, salty and fatty. 70 to 75% of food eaten should be vegetables and fruit. Three meals at set times with snacks allowed between meals, but they must be fruit or vegetables. Aim to eat over a 12 hour period , example 7 am to 7 pm, and STOP after  your last meal of the day. Drink water,generally about 64 ounces per day, no other drink is as healthy. Fruit juice is best enjoyed in a healthy way, by EATING the fruit. Thanks for choosing [facility], we consider it a privelige to serve you.

## 2020-02-06 NOTE — Assessment & Plan Note (Signed)
C/o persitent pain and stiffness post TKA refer to Ortho

## 2020-02-10 ENCOUNTER — Other Ambulatory Visit (HOSPITAL_COMMUNITY): Payer: Federal, State, Local not specified - PPO

## 2020-02-11 ENCOUNTER — Ambulatory Visit (HOSPITAL_COMMUNITY)
Admission: RE | Admit: 2020-02-11 | Discharge: 2020-02-11 | Disposition: A | Discharge status: Home / Self Care | Payer: Federal, State, Local not specified - PPO | Source: Ambulatory Visit | Attending: Family Medicine | Admitting: Family Medicine

## 2020-02-11 ENCOUNTER — Other Ambulatory Visit: Payer: Self-pay

## 2020-02-11 DIAGNOSIS — Z780 Asymptomatic menopausal state: Secondary | ICD-10-CM | POA: Insufficient documentation

## 2020-02-22 ENCOUNTER — Encounter (HOSPITAL_COMMUNITY): Payer: Self-pay

## 2020-02-22 ENCOUNTER — Other Ambulatory Visit: Payer: Self-pay

## 2020-02-22 ENCOUNTER — Inpatient Hospital Stay (HOSPITAL_COMMUNITY): Payer: Federal, State, Local not specified - PPO | Attending: Hematology

## 2020-02-22 VITALS — BP 131/76 | HR 68 | Temp 97.0°F

## 2020-02-22 DIAGNOSIS — E538 Deficiency of other specified B group vitamins: Secondary | ICD-10-CM

## 2020-02-22 MED ORDER — CYANOCOBALAMIN 1000 MCG/ML IJ SOLN
1000.0000 ug | Freq: Once | INTRAMUSCULAR | Status: AC
  Administered 2020-02-22: 1000 ug via INTRAMUSCULAR

## 2020-02-22 NOTE — Patient Instructions (Signed)
[facility] at [facility] Discharge Instructions  Received Vit B12 injection today. Follow-up as scheduled   Thank you for choosing  Can[facility] Center at [facility] to provide your oncology and hematology care.  To afford each patient quality time with our provider, please arrive at least 15 minutes before your scheduled appointment time.   If you have a lab appointment with the Cancer Center please come in thru the Main Entrance and check in at the main information desk.  You need to re-schedule your appointment should you arrive 10 or more minutes late.  We strive to give you quality time with our providers, and arriving late affects you and other patients whose appointments are after yours.  Also, if you no show three or more times for appointments you may be dismissed from the clinic at the providers discretion.     Again, thank you for choosing [facility].  Our hope is that these requests will decrease the amount of time that you wait before being seen by our physicians.       _____________________________________________________________  Should you have questions after your visit to [facility], please contact our office at [phone] and follow the prompts.  Our office hours are 8:00 a.m. and 4:30 p.m. Monday - Friday.  Please note that voicemails left after 4:00 p.m. may not be returned until the following business day.  We are closed weekends and major holidays.  You do have access to a nurse 24-7, just call the main number to the clinic [phone] and do not press any options, hold on the line and a nurse will answer the phone.    For prescription refill requests, have your pharmacy contact our office and allow 72 hours.    Due to Covid, you will need to wear a mask upon entering the hospital. If you do not have a mask, a mask will be given to you at the Main Entrance upon arrival. For doctor visits, patients may have  1 support person age 18 or older with them. For treatment visits, patients can not have anyone with them due to social distancing guidelines and our immunocompromised population.     

## 2020-02-22 NOTE — Progress Notes (Signed)
Sheri Jones tolerated Vit B12 injection  Well without complaints or incident. VSS Pt discharged self ambulatory using her cane in satisfactory condition

## 2020-02-29 IMAGING — DX DG FINGER INDEX 2+V*L*
3 series · 3 of 3 positions shown · non-contrast
Comparison: None.

CLINICAL DATA: Left index finger pain and swelling since a fall 1
month ago.

EXAM:
LEFT INDEX FINGER 2+V

[finger ap]
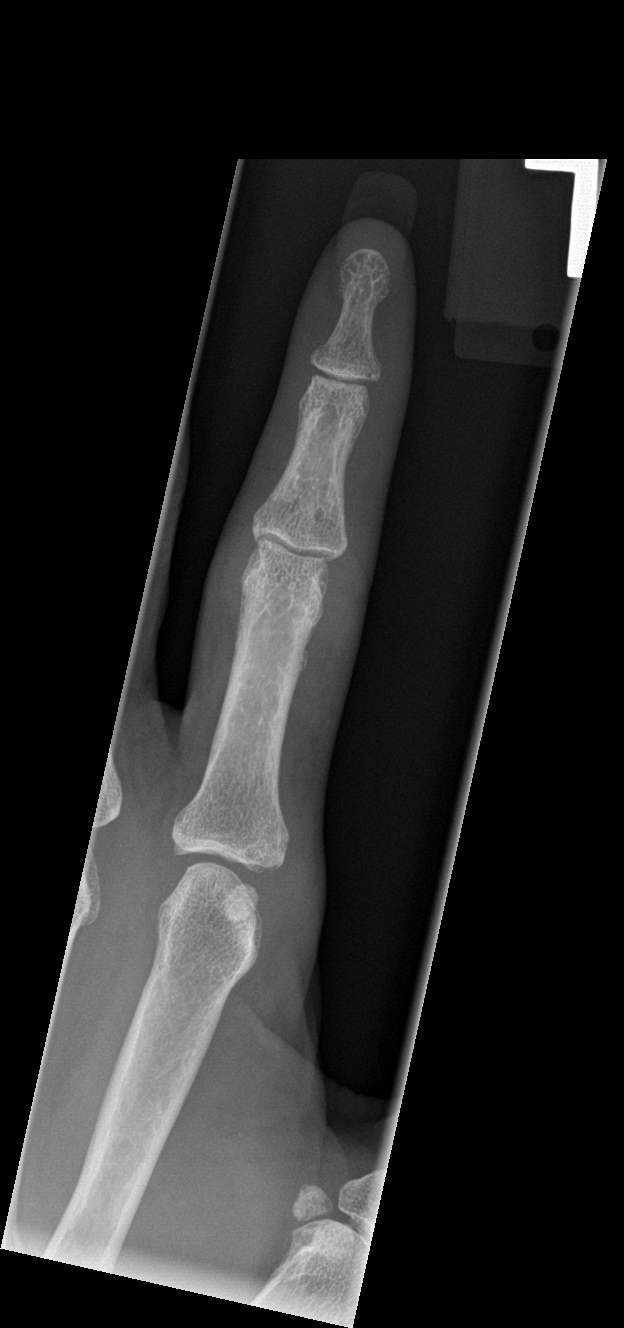

[finger obl]
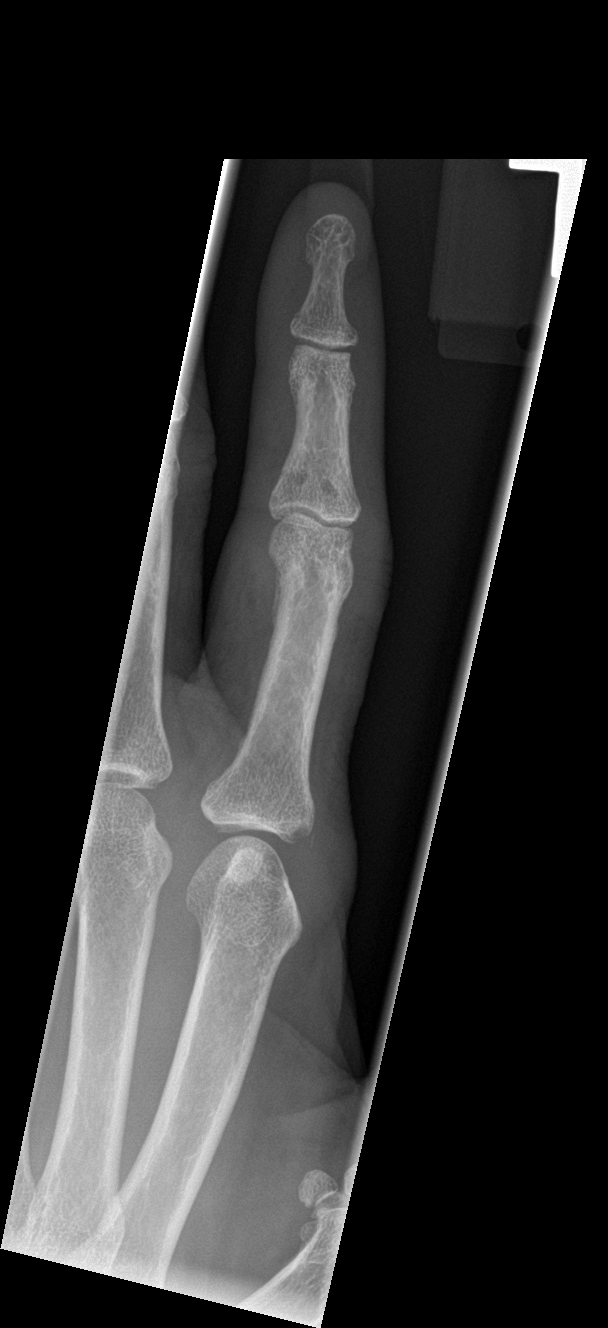

[finger lat]
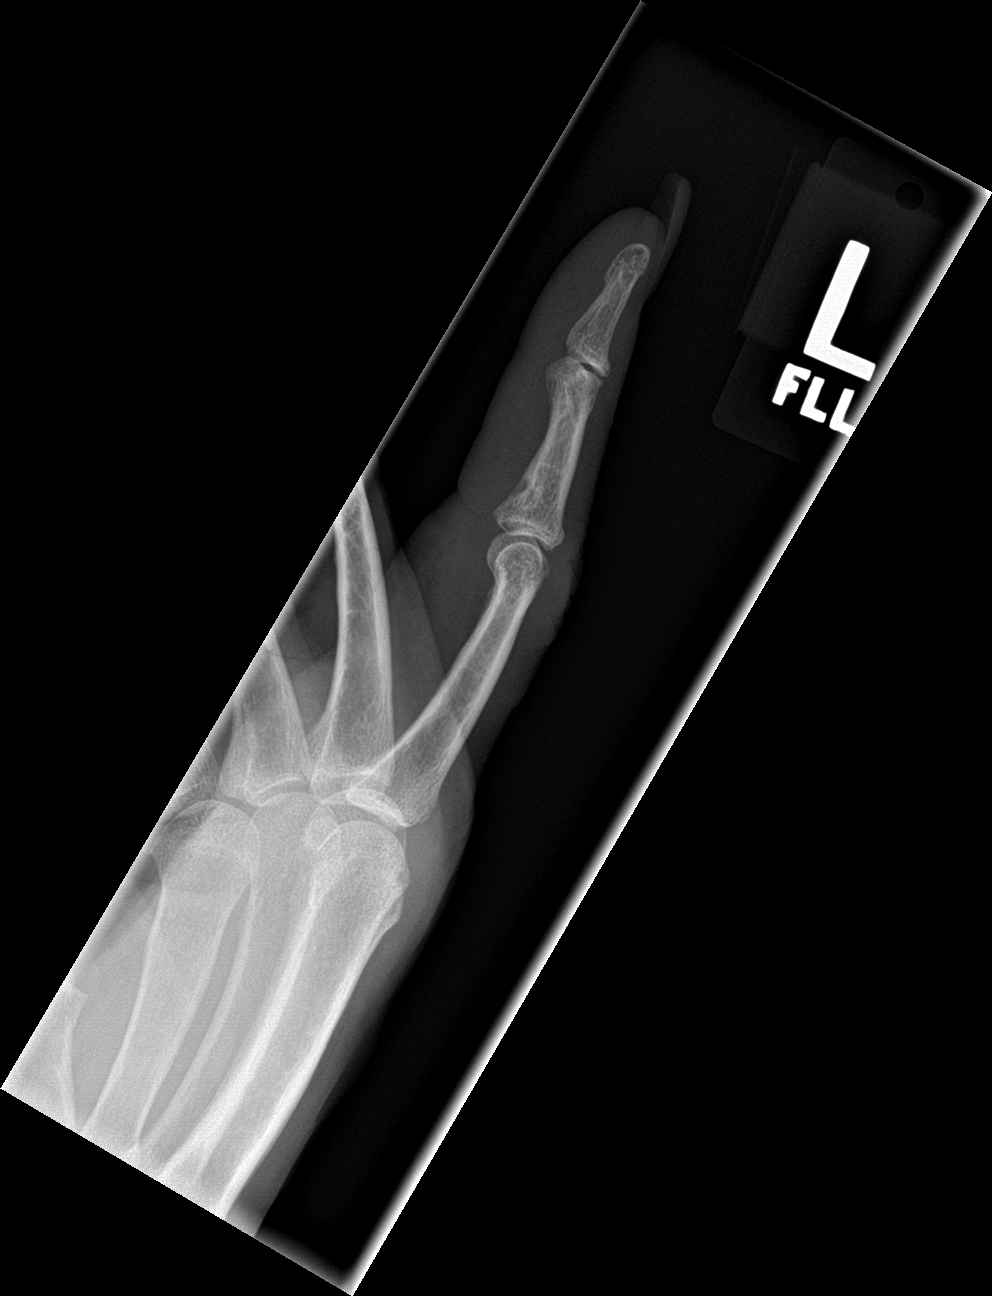

[3 of 3 positions shown; findings below may reference images not displayed]

FINDINGS: No fracture or dislocation is identified. There is soft tissue
swelling centered about the PIP joint. No soft tissue gas or
radiopaque foreign body.
IMPRESSION: Soft tissue swelling left index finger appears worst about the PIP
joint. Negative for fracture or foreign body.

## 2020-03-23 ENCOUNTER — Other Ambulatory Visit: Payer: Self-pay

## 2020-03-23 ENCOUNTER — Inpatient Hospital Stay (HOSPITAL_COMMUNITY): Payer: Federal, State, Local not specified - PPO | Attending: Hematology

## 2020-03-23 VITALS — BP 152/98 | HR 71 | Temp 97.0°F | Resp 18

## 2020-03-23 DIAGNOSIS — E538 Deficiency of other specified B group vitamins: Secondary | ICD-10-CM | POA: Insufficient documentation

## 2020-03-23 MED ORDER — CYANOCOBALAMIN 1000 MCG/ML IJ SOLN
1000.0000 ug | Freq: Once | INTRAMUSCULAR | Status: AC
  Administered 2020-03-23: 1000 ug via INTRAMUSCULAR
  Filled 2020-03-23: qty 1

## 2020-03-23 NOTE — Progress Notes (Signed)
Patient tolerated Vitamin B 12 injection with no complaints voiced.  Site clean and dry with no bruising or swelling noted.  No complaints of pain.  Discharged with vital signs stable and no signs or symptoms of distress noted.   ?

## 2020-04-07 ENCOUNTER — Ambulatory Visit (INDEPENDENT_AMBULATORY_CARE_PROVIDER_SITE_OTHER): Payer: Federal, State, Local not specified - PPO | Admitting: Orthopedic Surgery

## 2020-04-07 ENCOUNTER — Other Ambulatory Visit: Payer: Self-pay

## 2020-04-07 ENCOUNTER — Ambulatory Visit: Payer: Federal, State, Local not specified - PPO

## 2020-04-07 DIAGNOSIS — Z96652 Presence of left artificial knee joint: Secondary | ICD-10-CM | POA: Diagnosis not present

## 2020-04-07 DIAGNOSIS — M1712 Unilateral primary osteoarthritis, left knee: Secondary | ICD-10-CM | POA: Diagnosis not present

## 2020-04-07 DIAGNOSIS — Z96651 Presence of right artificial knee joint: Secondary | ICD-10-CM

## 2020-04-07 DIAGNOSIS — M1710 Unilateral primary osteoarthritis, unspecified knee: Secondary | ICD-10-CM

## 2020-04-07 NOTE — Progress Notes (Signed)
Chief Complaint  Patient presents with  . Follow-up    Recheck on left lnee replacement, DOS 03-24-19.    Encounter Diagnoses  Name Primary?  . 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 Arthritis of knee   . 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

## 2020-04-21 ENCOUNTER — Other Ambulatory Visit: Payer: Self-pay

## 2020-04-21 ENCOUNTER — Inpatient Hospital Stay (HOSPITAL_COMMUNITY): Payer: Federal, State, Local not specified - PPO

## 2020-04-21 VITALS — BP 149/93 | HR 82 | Temp 97.0°F | Resp 18

## 2020-04-21 DIAGNOSIS — E538 Deficiency of other specified B group vitamins: Secondary | ICD-10-CM

## 2020-04-21 MED ORDER — CYANOCOBALAMIN 1000 MCG/ML IJ SOLN
1000.0000 ug | Freq: Once | INTRAMUSCULAR | Status: AC
  Administered 2020-04-21: 12:00:00 1000 ug via INTRAMUSCULAR
  Filled 2020-04-21: qty 1

## 2020-04-21 NOTE — Progress Notes (Signed)
Patient tolerated Vitamin B 12 injection with no complaints voiced.  Site clean and dry with no bruising or swelling noted.  No complaints of pain.  Discharged with vital signs stable and no signs or symptoms of distress noted.   ?

## 2020-05-12 ENCOUNTER — Encounter: Payer: Self-pay | Admitting: Nurse Practitioner

## 2020-05-12 ENCOUNTER — Other Ambulatory Visit: Payer: Self-pay

## 2020-05-12 ENCOUNTER — Ambulatory Visit (INDEPENDENT_AMBULATORY_CARE_PROVIDER_SITE_OTHER): Payer: Federal, State, Local not specified - PPO | Admitting: Nurse Practitioner

## 2020-05-12 DIAGNOSIS — R2242 Localized swelling, mass and lump, left lower limb: Secondary | ICD-10-CM

## 2020-05-12 NOTE — Assessment & Plan Note (Signed)
-  to the top of her left foot, but is not apparent today -she states it is worse in the morning -discussed using compression stocking to prevent swelling -if no improvement, would consider podiatry referral

## 2020-05-12 NOTE — Progress Notes (Signed)
Acute Office Visit  Subjective:    Patient ID: Sheri Jones, female    DOB: Jul 20, 1945, 75 y.o.   MRN: [id number]  Chief Complaint  Patient presents with  . Foot Swelling    L foot pain and swelling x 3 months     HPI Patient is in today for left foot swelling.  She states the swelling started after she helped her sister move. She does not remember a specific injury, but she states she started having swelling after that. She rates her pain at a 0/10 today, and she states it is more an issue with swelling to the top of her left foot.   Past Medical History:  Diagnosis Date  . Allergic rhinitis due to pollen   . Allergy    Phreesia 02/01/2020  . Anemia   . Arthritis    Phreesia 02/01/2020  . Diabetes mellitus without complication ([place])   . 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 Hypertension    Phreesia 02/01/2020  . Obesity, unspecified   . Osteoarthrosis, unspecified whether generalized or localized, lower leg   . Unspecified essential hypertension   . Vitamin B12 deficiency disease 10/28/2008   Qualifier: Diagnosis of  By: Moshe Cipro MD, Joycelyn Schmid      Past Surgical History:  Procedure Laterality Date  . COLONOSCOPY  2008  . EXAM UNDER ANESTHESIA WITH MANIPULATION OF KNEE Right 05/11/2015   Procedure: EXAM UNDER ANESTHESIA WITH MANIPULATION OF KNEE;  Surgeon: Carole Civil, MD;  Location: [facility];  Service: Orthopedics;  Laterality: Right;  . EYE SURGERY Left 04/27/2018   cataract  . EYE SURGERY Right 06/17/2018   cataract  . GASTRIC BYPASS  2004  . JOINT REPLACEMENT N/A    Phreesia 02/01/2020  . TOTAL KNEE ARTHROPLASTY Right 02/23/2015   Procedure: RIGHT TOTAL KNEE ARTHROPLASTY;  Surgeon: Carole Civil, MD;  Location: [facility];  Service: Orthopedics;  Laterality: Right;  . TOTAL KNEE ARTHROPLASTY Left 03/24/2019   Procedure: TOTAL KNEE ARTHROPLASTY;  Surgeon: Carole Civil, MD;  Location: [facility];  Service: Orthopedics;  Laterality: Left;     Family History  Problem Relation Age of Onset  . Hypertension Mother   . Hypertension Sister   . Hypertension Sister   . Stroke Sister 43  . Hypertension Sister   . Heart disease Father 45       massive heart attack   . Breast cancer Maternal Aunt   . Lung cancer Maternal Aunt     Social History   Socioeconomic History  . Marital status: Single    Spouse name: Not on file  . Number of children: Not on file  . Years of education: Not on file  . Highest education level: Not on file  Occupational History  . Not on file  Tobacco Use  . Smoking status: Never Smoker  . Smokeless tobacco: Never Used  Vaping Use  . Vaping Use: Never used  Substance and Sexual Activity  . Alcohol use: No  . Drug use: No  . Sexual activity: Not Currently  Other Topics Concern  . Not on file  Social History Narrative  . Not on file   Social Determinants of Health   Financial Resource Strain: Low Risk   . Difficulty of Paying Living Expenses: Not hard at all  Food Insecurity: No Food Insecurity  . Worried About Charity fundraiser in the Last Year: Never true  . Ran Out of Food in the Last Year: Never  true  Transportation Needs: No Transportation Needs  . Lack of Transportation (Medical): No  . Lack of Transportation (Non-Medical): No  Physical Activity: Insufficiently Active  . Days of Exercise per Week: 7 days  . Minutes of Exercise per Session: 20 min  Stress: No Stress Concern Present  . Feeling of Stress : Not at all  Social Connections: Moderately Isolated  . Frequency of Communication with Friends and Family: More than three times a week  . Frequency of Social Gatherings with Friends and Family: More than three times a week  . Attends Religious Services: More than 4 times per year  . Active Member of Clubs or Organizations: No  . Attends Archivist Meetings: Never  . Marital Status: Never married  Intimate Partner Violence: Not At Risk  . Fear of Current or  Ex-Partner: No  . Emotionally Abused: No  . Physically Abused: No  . Sexually Abused: No    Outpatient Medications Prior to Visit  Medication Sig Dispense Refill  . Calcium Carb-Cholecalciferol (CALCIUM 600+D) 600-800 MG-UNIT TABS Take 2 tablets by mouth daily.    . cetirizine (ZYRTEC) 10 MG tablet Take 10 mg by mouth daily as needed for allergies.     . cholecalciferol (VITAMIN D3) 25 MCG (1000 UT) tablet Take 1,000 Units by mouth daily.    . cyanocobalamin (,VITAMIN B-12,) 1000 MCG/ML injection Inject 1,000 mcg into the muscle every 30 (thirty) days.    . hydrochlorothiazide (HYDRODIURIL) 25 MG tablet TAKE 1 TABLET BY MOUTH EVERY DAY 90 tablet 1  . Multiple Vitamins-Minerals (CENTRUM ULTRA WOMENS PO) Take 1 tablet by mouth daily.    . potassium chloride (KLOR-CON) 10 MEQ tablet Take 1 tablet (10 mEq total) by mouth 2 (two) times daily. 180 tablet 1   No facility-administered medications prior to visit.    Allergies  Allergen Reactions  . Milk-Related Compounds     Gas      Review of Systems  Constitutional: Negative.   Musculoskeletal:       Occasional left foot swelling       Objective:    Physical Exam Constitutional:      Appearance: She is obese.  Musculoskeletal:     Comments: Left foot ROM and sensation intact; no swelling visible today  Neurological:     Mental Status: She is alert.     BP (!) 144/76   Pulse 80   Temp 98 F (36.7 C)   Resp 20   Ht 5\' 3"  (1.6 m)   Wt 218 lb (98.9 kg)   SpO2 98%   BMI 38.62 kg/m  Wt Readings from Last 3 Encounters:  05/12/20 218 lb (98.9 kg)  02/04/20 190 lb (86.2 kg)  11/04/19 203 lb (92.1 kg)    There are no preventive care reminders to display for this patient.  There are no preventive care reminders to display for this patient.   Lab Results  Component Value Date   TSH 1.39 01/28/2020   Lab Results  Component Value Date   WBC 6.5 09/22/2019   HGB 13.0 09/22/2019   HCT 37.4 09/22/2019   MCV 89.5  09/22/2019   PLT 177 09/22/2019   Lab Results  Component Value Date   NA 142 01/28/2020   K 3.8 01/28/2020   CO2 28 01/28/2020   GLUCOSE 81 01/28/2020   BUN 17 01/28/2020   CREATININE 0.80 01/28/2020   BILITOT 0.4 06/19/2019   ALKPHOS 83 06/19/2019   AST 28 06/19/2019  ALT 20 06/19/2019   PROT 7.3 06/19/2019   ALBUMIN 3.8 06/19/2019   CALCIUM 9.4 01/28/2020   ANIONGAP 9 09/22/2019   Lab Results  Component Value Date   CHOL 196 01/28/2020   Lab Results  Component Value Date   HDL 75 01/28/2020   Lab Results  Component Value Date   LDLCALC 105 (H) 01/28/2020   Lab Results  Component Value Date   TRIG 72 01/28/2020   Lab Results  Component Value Date   CHOLHDL 2.6 01/28/2020   Lab Results  Component Value Date   HGBA1C 5.3 02/12/2019       Assessment & Plan:   Problem List Items Addressed This Visit      Other   Localized swelling of left foot    -to the top of her left foot, but is not apparent today -she states it is worse in the morning -discussed using compression stocking to prevent swelling -if no improvement, would consider podiatry referral          No orders of the defined types were placed in this encounter.    Noreene Larsson, NP

## 2020-05-12 NOTE — Patient Instructions (Signed)
You were seen today for left foot swelling.  Any swelling today was mild, so we will try compression stockings. [facility] has them available, and you can wear them when you have swelling or prior to doing activities that may cause swelling.  If you don't notice an improvement with compression stockings in 2-4 weeks, please return to the clinic.

## 2020-05-20 ENCOUNTER — Other Ambulatory Visit: Payer: Self-pay

## 2020-05-20 ENCOUNTER — Inpatient Hospital Stay (HOSPITAL_COMMUNITY): Payer: Federal, State, Local not specified - PPO | Attending: Hematology

## 2020-05-20 VITALS — HR 87 | Temp 97.8°F | Resp 14

## 2020-05-20 DIAGNOSIS — E538 Deficiency of other specified B group vitamins: Secondary | ICD-10-CM | POA: Insufficient documentation

## 2020-05-20 MED ORDER — CYANOCOBALAMIN 1000 MCG/ML IJ SOLN
1000.0000 ug | Freq: Once | INTRAMUSCULAR | Status: AC
  Administered 2020-05-20: 1000 ug via INTRAMUSCULAR
  Filled 2020-05-20: qty 1

## 2020-05-20 NOTE — Progress Notes (Signed)
Sheri Jones presents today for injection per the provider's orders.  Vitamin B12 1000 mcg administration without incident; injection site WNL; see MAR for injection details.  Patient tolerated procedure well and without incident.  No questions or complaints noted at this time. Patient discharged ambulatory and in stable condition.

## 2020-06-16 ENCOUNTER — Encounter: Payer: Self-pay | Admitting: Internal Medicine

## 2020-06-24 ENCOUNTER — Inpatient Hospital Stay (HOSPITAL_COMMUNITY): Payer: Federal, State, Local not specified - PPO

## 2020-06-24 ENCOUNTER — Other Ambulatory Visit: Payer: Self-pay

## 2020-06-24 ENCOUNTER — Inpatient Hospital Stay (HOSPITAL_COMMUNITY): Payer: Federal, State, Local not specified - PPO | Attending: Hematology

## 2020-06-24 ENCOUNTER — Ambulatory Visit (HOSPITAL_COMMUNITY): Payer: Federal, State, Local not specified - PPO

## 2020-06-24 VITALS — BP 131/73 | HR 72 | Temp 97.0°F | Resp 18

## 2020-06-24 DIAGNOSIS — M8580 Other specified disorders of bone density and structure, unspecified site: Secondary | ICD-10-CM | POA: Insufficient documentation

## 2020-06-24 DIAGNOSIS — Z9884 Bariatric surgery status: Secondary | ICD-10-CM | POA: Diagnosis not present

## 2020-06-24 DIAGNOSIS — E538 Deficiency of other specified B group vitamins: Secondary | ICD-10-CM

## 2020-06-24 LAB — CBC WITH DIFFERENTIAL/PLATELET
Abs Immature Granulocytes: 0.01 10*3/uL (ref 0.00–0.07)
Basophils Absolute: 0 10*3/uL (ref 0.0–0.1)
Basophils Relative: 1 %
Eosinophils Absolute: 0.1 10*3/uL (ref 0.0–0.5)
Eosinophils Relative: 2 %
HCT: 40.4 % (ref 36.0–46.0)
Hemoglobin: 13.5 g/dL (ref 12.0–15.0)
Immature Granulocytes: 0 %
Lymphocytes Relative: 45 %
Lymphs Abs: 2.1 10*3/uL (ref 0.7–4.0)
MCH: 31.4 pg (ref 26.0–34.0)
MCHC: 33.4 g/dL (ref 30.0–36.0)
MCV: 94 fL (ref 80.0–100.0)
Monocytes Absolute: 0.5 10*3/uL (ref 0.1–1.0)
Monocytes Relative: 11 %
Neutro Abs: 1.9 10*3/uL (ref 1.7–7.7)
Neutrophils Relative %: 41 %
Platelets: 174 10*3/uL (ref 150–400)
RBC: 4.3 MIL/uL (ref 3.87–5.11)
RDW: 15.9 % — ABNORMAL HIGH (ref 11.5–15.5)
WBC: 4.7 10*3/uL (ref 4.0–10.5)
nRBC: 0 % (ref 0.0–0.2)

## 2020-06-24 LAB — IRON AND TIBC
Iron: 94 ug/dL (ref 28–170)
Saturation Ratios: 27 % (ref 10.4–31.8)
TIBC: 354 ug/dL (ref 250–450)
UIBC: 260 ug/dL

## 2020-06-24 LAB — COMPREHENSIVE METABOLIC PANEL
ALT: 25 U/L (ref 0–44)
AST: 28 U/L (ref 15–41)
Albumin: 3.9 g/dL (ref 3.5–5.0)
Alkaline Phosphatase: 78 U/L (ref 38–126)
Anion gap: 9 (ref 5–15)
BUN: 18 mg/dL (ref 8–23)
CO2: 25 mmol/L (ref 22–32)
Calcium: 8.9 mg/dL (ref 8.9–10.3)
Chloride: 106 mmol/L (ref 98–111)
Creatinine, Ser: 0.86 mg/dL (ref 0.44–1.00)
GFR, Estimated: >60 mL/min (ref >60)
Glucose, Bld: 91 mg/dL (ref 70–99)
Potassium: 3.4 mmol/L — ABNORMAL LOW (ref 3.5–5.1)
Sodium: 140 mmol/L (ref 135–145)
Total Bilirubin: 0.8 mg/dL (ref 0.3–1.2)
Total Protein: 7 g/dL (ref 6.5–8.1)

## 2020-06-24 LAB — LACTATE DEHYDROGENASE: LDH: 192 U/L (ref 98–192)

## 2020-06-24 LAB — VITAMIN D 25 HYDROXY (VIT D DEFICIENCY, FRACTURES): Vit D, 25-Hydroxy: 36.83 ng/mL (ref 30–100)

## 2020-06-24 LAB — VITAMIN B12: Vitamin B-12: 757 pg/mL (ref 180–914)

## 2020-06-24 LAB — FERRITIN: Ferritin: 57 ng/mL (ref 11–307)

## 2020-06-24 LAB — FOLATE: Folate: 23.6 ng/mL (ref >5.9)

## 2020-06-24 MED ORDER — CYANOCOBALAMIN 1000 MCG/ML IJ SOLN
INTRAMUSCULAR | Status: AC
  Filled 2020-06-24: qty 1

## 2020-06-24 MED ORDER — CYANOCOBALAMIN 1000 MCG/ML IJ SOLN
1000.0000 ug | Freq: Once | INTRAMUSCULAR | Status: AC
  Administered 2020-06-24: 1000 ug via INTRAMUSCULAR

## 2020-06-24 NOTE — Progress Notes (Signed)
Patient presents today for B12 injection.  Vital signs WNL.  Patient has no new complaints since last visit.  B12 injection given today per MD orders.  Stable during injection without adverse affects.  Injection site WNL.  Vital signs stable.  No complaints at this time.  Discharge from clinic ambulatory in stable condition.  Alert and oriented X 3.  Follow up with [facility] as scheduled.

## 2020-06-24 NOTE — Patient Instructions (Signed)

## 2020-06-26 ENCOUNTER — Other Ambulatory Visit: Payer: Self-pay | Admitting: Family Medicine

## 2020-06-27 ENCOUNTER — Other Ambulatory Visit: Payer: Self-pay

## 2020-06-27 ENCOUNTER — Inpatient Hospital Stay (HOSPITAL_BASED_OUTPATIENT_CLINIC_OR_DEPARTMENT_OTHER): Payer: Federal, State, Local not specified - PPO | Admitting: Hematology

## 2020-06-27 DIAGNOSIS — E538 Deficiency of other specified B group vitamins: Secondary | ICD-10-CM

## 2020-06-27 NOTE — Progress Notes (Signed)
Virtual Visit via Telephone Note  I connected with Sheri Jones on 06/27/20 at  4:15 PM EST by telephone and verified that I am speaking with the correct person using two identifiers.  Location: Patient: At home Provider: In the office   I discussed the limitations, risks, security and privacy concerns of performing an evaluation and management service by telephone and the availability of in person appointments. I also discussed with the patient that there may be a patient responsible charge related to this service. The patient expressed understanding and agreed to proceed.   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  She is on monthly B12 injections.   Observations/Objective: Denies any bleeding per rectum or melena.  Denies any fevers, night sweats or weight loss in the last 6 months.  Appetite is 100%.  Energy levels are stable at 50%.  Assessment and Plan:  1.  Vitamin B12 deficiency: -Reviewed CBC from 06/24/2020.  Hemoglobin 13.5 and B12 is 757. -Continue monthly vitamin B12 injections. -She also takes Centrum Silver multivitamin tablet daily. -Her folic acid was normal.  Ferritin was 57 with percent saturation of 27.  LDH was normal. -Also reviewed CMP which was grossly within normal limits. -RTC 1 year with repeat labs.  2.  Osteopenia: -DEXA scan on 02/11/2020 with T score -1.7. -Vitamin D is within normal limits at 36. -Continue calcium supplements daily.     Follow Up Instructions:   RTC 1 year with labs. I discussed the assessment and treatment plan with the patient. The patient was provided an opportunity to ask questions and all were answered. The patient agreed with the plan and demonstrated an understanding of the instructions.   The patient was advised to call back or seek an in-person evaluation if the symptoms worsen or if the  condition fails to improve as anticipated.  I provided 11 minutes of non-face-to-face time during this encounter.   Derek Jack, MD

## 2020-07-06 ENCOUNTER — Other Ambulatory Visit: Payer: Self-pay | Admitting: Family Medicine

## 2020-07-12 ENCOUNTER — Ambulatory Visit (INDEPENDENT_AMBULATORY_CARE_PROVIDER_SITE_OTHER): Payer: Self-pay | Admitting: *Deleted

## 2020-07-12 ENCOUNTER — Other Ambulatory Visit: Payer: Self-pay

## 2020-07-12 VITALS — Ht 64.0 in | Wt 217.8 lb

## 2020-07-12 DIAGNOSIS — Z1211 Encounter for screening for malignant neoplasm of colon: Secondary | ICD-10-CM

## 2020-07-12 MED ORDER — PEG 3350-KCL-NA BICARB-NACL 420 G PO SOLR: 4000.0000 mL | Freq: Once | ORAL | 0 refills | Status: AC

## 2020-07-12 NOTE — Progress Notes (Signed)
Gastroenterology Pre-Procedure Review  Request Date: 07/12/2020 Requesting Physician: 10 year recall, Last TCS 07/19/2010 done by Dr. Gala Romney, internal hemorrhoid, anal papilla, normal colon, family hx of colon cancer (aunt)  PATIENT REVIEW QUESTIONS: The patient responded to the following health history questions as indicated:    1. Diabetes Melitis: no 2. Joint replacements in the past 12 months: no 3. Major health problems in the past 3 months: no 4. Has an artificial valve or MVP: no 5. Has a defibrillator: no 6. Has been advised in past to take antibiotics in advance of a procedure like teeth cleaning: no 7. Family history of colon cancer: yes, aunt: age 44's  8. Alcohol Use: no 9. Illicit drug Use: no 10. History of sleep apnea: no  11. History of coronary artery or other vascular stents placed within the last 12 months: no 12. History of any prior anesthesia complications: no 13. Body mass index is 37.39 kg/m.    MEDICATIONS & ALLERGIES:    Patient reports the following regarding taking any blood thinners:   Plavix? no Aspirin? no Coumadin? no Brilinta? no Xarelto? no Eliquis? no Pradaxa? no Savaysa? no Effient? no  Patient confirms/reports the following medications:  Current Outpatient Medications  Medication Sig Dispense Refill  . Calcium Carb-Cholecalciferol (CALCIUM 600+D) 600-800 MG-UNIT TABS Take 2 tablets by mouth daily.    . cetirizine (ZYRTEC) 10 MG tablet Take 10 mg by mouth daily as needed for allergies.     . cholecalciferol (VITAMIN D3) 25 MCG (1000 UT) tablet Take 1,000 Units by mouth daily.    . cyanocobalamin (,VITAMIN B-12,) 1000 MCG/ML injection Inject 1,000 mcg into the muscle every 30 (thirty) days.    . diphenhydrAMINE (BENADRYL) 25 MG tablet Take 25 mg by mouth as needed.    . hydrochlorothiazide (HYDRODIURIL) 25 MG tablet TAKE 1 TABLET BY MOUTH EVERY DAY 90 tablet 1  . Multiple Vitamins-Minerals (CENTRUM ULTRA WOMENS PO) Take 1 tablet by mouth  daily.    . pantoprazole (PROTONIX) 20 MG tablet Take 20 mg by mouth as needed.    . potassium chloride (KLOR-CON) 10 MEQ tablet TAKE 1 TABLET (10 MEQ TOTAL) BY MOUTH 2 (TWO) TIMES DAILY. 180 tablet 1   No current facility-administered medications for this visit.    Patient confirms/reports the following allergies:  No Known Allergies  No orders of the defined types were placed in this encounter.   AUTHORIZATION INFORMATION Primary Insurance: Medicare,  ID #: [id number] Pre-Cert / Auth required: No, not required  Secondary Insurance: [employer],  [place] #:[id number] ,  Group #: [id number] Pre-Cert / Auth required: No, not required  SCHEDULE INFORMATION: Procedure has been scheduled as follows:  Date: 08/31/2020, Time: AM procedure Location: [facility] with Dr. Gala Romney  This Gastroenterology Pre-Precedure Review Form is being routed to the following provider(s): Neil Crouch, PA

## 2020-07-12 NOTE — Patient Instructions (Addendum)
Covid test: 09/26/2020 at 11:00.   Sheri Jones   1969-06-01 MRN: [id number] Procedure Date: 09/28/2020  Arrival Time: 12:00 PM      Location of Procedure: [facility]  PREPARATION FOR COLONOSCOPY WITH TRI-LYTE PREP   Please notify us immediately if you are diabetic, take iron supplements, or if you are on Coumadin or any other blood thinners.   Please hold the following medications: n/a  You will need to purchase 1 fleet enema and 1 box of Bisacodyl 5mg  tablets.   PROCEDURE IS SCHEDULED FOR Sheri Jones AS FOLLOWS:  Procedure Date: 09/28/2020 Time to register: 12:00 PM Place to register: [facility] Scheduled provider: Dr. Gala Romney   2 DAYS BEFORE PROCEDURE:  DATE: 09/26/2020   DAY: Monday Begin clear liquid diet AFTER your lunch meal. NO SOLID FOODS!   1 DAY BEFORE PROCEDURE:  DATE: 09/27/2020   DAY: Tuesday  Continue clear liquids the entire day - NO SOLID FOOD.   Diabetic medications adjustments for today: n/a  At 12:00pm (noon): Take 2 (two) Dulcolax (Bisacodyl) tablets  At 2:00pm: Start drinking your solution. Try to drink 1 (one) 8 ounce glass every 10-15 minutes, until you have consumed HALF the jug. (You should complete the first 1/2 of the jug in 2 hours. Wait 30 minutes, then drink 3-4 more glasses of the solution. Your stools should be clear; if not, you may have to consume the rest of the jug.   One hour after completing the solution: take the last 2 (two) Dulcolax (Bisacodyl) tablets, with a clear liquid.  YOU MUST DRINK PLENTY OF CLEAR LIQUIDS DURING YOUR PREP TO REDUCE RISKS OF KIDNEY FAILURE.   Continue clear liquids until 4 hours before your scheduled procedure on 09/28/2020.  Do not eat or drink anything after 9:00 AM on 09/28/2020.  EXCEPTION:  If you take medications for your heart, blood pressure or breathing, you may take these medications with a small amount of clear liquid.      DAY OF PROCEDURE:   DATE: 09/28/2020      DAY:  Wednesday The morning of your procedure give yourself 1 (one) Fleet Enema, at least 1 hour before going to the hospital.   You may take Tylenol products. Please continue your regular medications unless we have instructed otherwise.   Diabetic medications adjustments for today. n/a  Someone MUST be available to drive you home; the hospital will cancel this appointment if you do not have a driver.   Please call the office if you have any questions (Dept: [phone]).  Please see below for Dietary Information.  CLEAR LIQUIDS INCLUDE:  Water Jello (NOT red in color)   Ice Popsicles (NOT red in color)   Tea (sugar ok, no milk/cream) Powdered fruit flavored drinks  Coffee (sugar ok, no milk/cream) Gatorade/ Lemonade/ Kool-Aid  (NOT red in color)   Juice: apple, white grape, white cranberry Soft drinks  Clear bullion, consomme, broth (fat free beef/chicken/vegetable)  Carbonated beverages (any kind)  Strained chicken noodle soup Hard Candy   REMEMBER: Clear liquids are liquids that will allow you to see your fingers on the other side of a clear glass. Be sure liquids are NOT red in color, and not cloudy, but CLEAR.   DO NOT EAT OR DRINK ANY OF THE FOLLOWING:  Dairy products of any kind   Cranberry juice Tomato juice / V8 juice   Grapefruit juice Orange juice     Red grape juice  Do not  eat any solid foods, including such foods as: cereal, oatmeal, yogurt, fruits, vegetables, creamed soups, eggs, bread, etc.    HELPFUL HINTS FOR DRINKING PREP SOLUTION:   Make sure prep is extremely cold. Refrigerate the night before. You may also put in the freezer.   You may try mixing some Crystal Light or Country Time Lemonade if you prefer. Mix in small amounts; add more if necessary.  Try drinking through a straw  Rinse mouth with water or a mouthwash between glasses, to remove after-taste.  Try sipping on a cold beverage /ice/ popsicles between glasses of prep  Place a piece of  sugar-free hard candy in mouth between glasses  If you become nauseated, try consuming smaller amounts, or stretch out the time between glasses. Stop for 30-60 minutes, then slowly start back drinking    You may call the office (Dept: [phone]) before 5:00pm, or page the doctor on call after 5:00pm [phone]), for further instructions, if necessary.   OTHER INSTRUCTIONS  You will need a responsible adult at least 73 years of age to accompany you and drive you home. This person must remain in the waiting room during your procedure.  Wear loose fitting clothing that is easily removed.  Leave jewelry and other valuables at home.   Remove all body piercing jewelry and leave at home.  Total time from sign-in until discharge is approximately 2-3 hours.  You should go home directly after your procedure and rest. You can resume normal activities the day after your procedure.  The day of your procedure you should not:  Drive  Make legal decisions  Operate machinery  Drink alcohol  Return to work

## 2020-07-19 ENCOUNTER — Encounter: Payer: Self-pay | Admitting: *Deleted

## 2020-07-19 NOTE — Progress Notes (Signed)
Ok to schedule conscious sedation. ASA II.  °

## 2020-07-25 ENCOUNTER — Other Ambulatory Visit: Payer: Self-pay

## 2020-07-25 ENCOUNTER — Inpatient Hospital Stay (HOSPITAL_COMMUNITY): Payer: Federal, State, Local not specified - PPO | Attending: Hematology

## 2020-07-25 ENCOUNTER — Encounter (HOSPITAL_COMMUNITY): Payer: Self-pay

## 2020-07-25 ENCOUNTER — Ambulatory Visit (HOSPITAL_COMMUNITY): Payer: Federal, State, Local not specified - PPO

## 2020-07-25 VITALS — BP 128/58 | HR 78 | Temp 96.6°F | Resp 18

## 2020-07-25 DIAGNOSIS — E538 Deficiency of other specified B group vitamins: Secondary | ICD-10-CM | POA: Insufficient documentation

## 2020-07-25 MED ORDER — CYANOCOBALAMIN 1000 MCG/ML IJ SOLN
1000.0000 ug | Freq: Once | INTRAMUSCULAR | Status: AC
  Administered 2020-07-25: 1000 ug via INTRAMUSCULAR
  Filled 2020-07-25: qty 1

## 2020-07-25 NOTE — Progress Notes (Signed)
Patient tolerated injection with no complaints voiced. Site clean and dry with no bruising or swelling noted at site. See MAR for details. Band aid applied.  Patient stable during and after injection. VSS with discharge and left in satisfactory condition with no s/s of distress noted.  

## 2020-07-25 NOTE — Patient Instructions (Signed)
[facility] [facility]  Discharge Instructions:  You received B12 injection, return as scheduled. _______________________________________________________________  Thank you for choosing [place] at [facility] to provide your oncology and hematology care.  To afford each patient quality time with our providers, please arrive at least 15 minutes before your scheduled appointment.  You need to re-schedule your appointment if you arrive 10 or more minutes late.  We strive to give you quality time with our providers, and arriving late affects you and other patients whose appointments are after yours.  Also, if you no show three or more times for appointments you may be dismissed from the clinic.  Again, thank you for choosing [facility] [place] hope is that these requests will allow you access to exceptional care and in a timely manner. _______________________________________________________________  If you have questions after your visit, please contact our office at [phone] between the hours of 8:30 a.m. and 5:00 p.m. Voicemails left after 4:30 p.m. will not be returned until the following business day. _______________________________________________________________  For prescription refill requests, have your pharmacy contact our office. _______________________________________________________________  Recommendations made by the consultant and any test results will be sent to your referring physician. _______________________________________________________________

## 2020-07-28 ENCOUNTER — Other Ambulatory Visit: Payer: Self-pay | Admitting: Family Medicine

## 2020-07-29 LAB — BASIC METABOLIC PANEL WITH GFR
BUN: 22 mg/dL (ref 7–25)
CO2: 27 mmol/L (ref 20–32)
Calcium: 9.1 mg/dL (ref 8.6–10.4)
Chloride: 107 mmol/L (ref 98–110)
Creat: 0.81 mg/dL (ref 0.60–0.93)
GFR, Est African American: 84 mL/min/{1.73_m2} (ref >=60)
GFR, Est Non African American: 73 mL/min/{1.73_m2} (ref >=60)
Glucose, Bld: 87 mg/dL (ref 65–99)
Potassium: 3.9 mmol/L (ref 3.5–5.3)
Sodium: 142 mmol/L (ref 135–146)

## 2020-08-04 ENCOUNTER — Other Ambulatory Visit: Payer: Self-pay

## 2020-08-04 ENCOUNTER — Ambulatory Visit: Payer: Federal, State, Local not specified - PPO | Admitting: Family Medicine

## 2020-08-04 ENCOUNTER — Encounter: Payer: Self-pay | Admitting: Family Medicine

## 2020-08-04 VITALS — BP 130/82 | HR 78 | Temp 97.8°F | Ht 63.0 in | Wt 218.0 lb

## 2020-08-04 DIAGNOSIS — Z1231 Encounter for screening mammogram for malignant neoplasm of breast: Secondary | ICD-10-CM

## 2020-08-04 DIAGNOSIS — I10 Essential (primary) hypertension: Secondary | ICD-10-CM

## 2020-08-04 DIAGNOSIS — M25561 Pain in right knee: Secondary | ICD-10-CM | POA: Diagnosis not present

## 2020-08-04 DIAGNOSIS — M25562 Pain in left knee: Secondary | ICD-10-CM

## 2020-08-04 DIAGNOSIS — G8929 Other chronic pain: Secondary | ICD-10-CM

## 2020-08-04 NOTE — Patient Instructions (Addendum)
F/U in 3 months, call if you need me before  Weight loss goal of 10 pounds  Please schedule mammogram at checkout   Please give 1500 cal diet sheet  It is important that you exercise regularly at least 30 minutes 5 times a week. If you develop chest pain, have severe difficulty breathing, or feel very tired, stop exercising immediately and seek medical attention   CLEAN up your home food environment please, and drink only water, 64 oz/ day     Calorie Counting for Weight Loss Calories are units of energy. Your body needs a certain number of calories from food to keep going throughout the day. When you eat or drink more calories than your body needs, your body stores the extra calories mostly as fat. When you eat or drink fewer calories than your body needs, your body burns fat to get the energy it needs. Calorie counting means keeping track of how many calories you eat and drink each day. Calorie counting can be helpful if you need to lose weight. If you eat fewer calories than your body needs, you should lose weight. Ask your health care provider what a healthy weight is for you. For calorie counting to work, you will need to eat the right number of calories each day to lose a healthy amount of weight per week. A dietitian can help you figure out how many calories you need in a day and will suggest ways to reach your calorie goal.  A healthy amount of weight to lose each week is usually 1-2 lb (0.5-0.9 kg). This usually means that your daily calorie intake should be reduced by 500-750 calories.  Eating 1,200-1,500 calories a day can help most women lose weight.  Eating 1,500-1,800 calories a day can help most men lose weight. What do I need to know about calorie counting? Work with your health care provider or dietitian to determine how many calories you should get each day. To meet your daily calorie goal, you will need to:  Find out how many calories are in each food that you would  like to eat. Try to do this before you eat.  Decide how much of the food you plan to eat.  Keep a food log. Do this by writing down what you ate and how many calories it had. To successfully lose weight, it is important to balance calorie counting with a healthy lifestyle that includes regular activity. Where do I find calorie information? The number of calories in a food can be found on a Nutrition Facts label. If a food does not have a Nutrition Facts label, try to look up the calories online or ask your dietitian for help. Remember that calories are listed per serving. If you choose to have more than one serving of a food, you will have to multiply the calories per serving by the number of servings you plan to eat. For example, the label on a package of bread might say that a serving size is 1 slice and that there are 90 calories in a serving. If you eat 1 slice, you will have eaten 90 calories. If you eat 2 slices, you will have eaten 180 calories.   How do I keep a food log? After each time that you eat, record the following in your food log as soon as possible:  What you ate. Be sure to include toppings, sauces, and other extras on the food.  How much you ate. This can be measured  in cups, ounces, or number of items.  How many calories were in each food and drink.  The total number of calories in the food you ate. Keep your food log near you, such as in a pocket-sized notebook or on an app or website on your mobile phone. Some programs will calculate calories for you and show you how many calories you have left to meet your daily goal. What are some portion-control tips?  Know how many calories are in a serving. This will help you know how many servings you can have of a certain food.  Use a measuring cup to measure serving sizes. You could also try weighing out portions on a kitchen scale. With time, you will be able to estimate serving sizes for some foods.  Take time to put  servings of different foods on your favorite plates or in your favorite bowls and cups so you know what a serving looks like.  Try not to eat straight from a food's packaging, such as from a bag or box. Eating straight from the package makes it hard to see how much you are eating and can lead to overeating. Put the amount you would like to eat in a cup or on a plate to make sure you are eating the right portion.  Use smaller plates, glasses, and bowls for smaller portions and to prevent overeating.  Try not to multitask. For example, avoid watching TV or using your computer while eating. If it is time to eat, sit down at a table and enjoy your food. This will help you recognize when you are full. It will also help you be more mindful of what and how much you are eating. What are tips for following this plan? Reading food labels  Check the calorie count compared with the serving size. The serving size may be smaller than what you are used to eating.  Check the source of the calories. Try to choose foods that are high in protein, fiber, and vitamins, and low in saturated fat, trans fat, and sodium. Shopping  Read nutrition labels while you shop. This will help you make healthy decisions about which foods to buy.  Pay attention to nutrition labels for low-fat or fat-free foods. These foods sometimes have the same number of calories or more calories than the full-fat versions. They also often have added sugar, starch, or salt to make up for flavor that was removed with the fat.  Make a grocery list of lower-calorie foods and stick to it. Cooking  Try to cook your favorite foods in a healthier way. For example, try baking instead of frying.  Use low-fat dairy products. Meal planning  Use more fruits and vegetables. One-half of your plate should be fruits and vegetables.  Include lean proteins, such as chicken, [place], and fish. Lifestyle Each week, aim to do one of the following:  150  minutes of moderate exercise, such as walking.  75 minutes of vigorous exercise, such as running. General information  Know how many calories are in the foods you eat most often. This will help you calculate calorie counts faster.  Find a way of tracking calories that works for you. Get creative. Try different apps or programs if writing down calories does not work for you. What foods should I eat?  Eat nutritious foods. It is better to have a nutritious, high-calorie food, such as an avocado, than a food with few nutrients, such as a bag of potato chips.  Use  your calories on foods and drinks that will fill you up and will not leave you hungry soon after eating. ? Examples of foods that fill you up are nuts and nut butters, vegetables, lean proteins, and high-fiber foods such as whole grains. High-fiber foods are foods with more than 5 g of fiber per serving.  Pay attention to calories in drinks. Low-calorie drinks include water and unsweetened drinks. The items listed above may not be a complete list of foods and beverages you can eat. Contact a dietitian for more information.   What foods should I limit? Limit foods or drinks that are not good sources of vitamins, minerals, or protein or that are high in unhealthy fats. These include:  Candy.  Other sweets.  Sodas, specialty coffee drinks, alcohol, and juice. The items listed above may not be a complete list of foods and beverages you should avoid. Contact a dietitian for more information. How do I count calories when eating out?  Pay attention to portions. Often, portions are much larger when eating out. Try these tips to keep portions smaller: ? Consider sharing a meal instead of getting your own. ? If you get your own meal, eat only half of it. Before you start eating, ask for a container and put half of your meal into it. ? When available, consider ordering smaller portions from the menu instead of full portions.  Pay  attention to your food and drink choices. Knowing the way food is cooked and what is included with the meal can help you eat fewer calories. ? If calories are listed on the menu, choose the lower-calorie options. ? Choose dishes that include vegetables, fruits, whole grains, low-fat dairy products, and lean proteins. ? Choose items that are boiled, broiled, grilled, or steamed. Avoid items that are buttered, battered, fried, or served with cream sauce. Items labeled as crispy are usually fried, unless stated otherwise. ? Choose water, low-fat milk, unsweetened iced tea, or other drinks without added sugar. If you want an alcoholic beverage, choose a lower-calorie option, such as a glass of wine or light beer. ? Ask for dressings, sauces, and syrups on the side. These are usually high in calories, so you should limit the amount you eat. ? If you want a salad, choose a garden salad and ask for grilled meats. Avoid extra toppings such as bacon, cheese, or fried items. Ask for the dressing on the side, or ask for olive oil and vinegar or lemon to use as dressing.  Estimate how many servings of a food you are given. Knowing serving sizes will help you be aware of how much food you are eating at restaurants. Where to find more information  Centers for Disease Control and Prevention: http://www.wolf.info/  [place] Department of Agriculture: http://www.wilson-mendoza.org/ Summary  Calorie counting means keeping track of how many calories you eat and drink each day. If you eat fewer calories than your body needs, you should lose weight.  A healthy amount of weight to lose per week is usually 1-2 lb (0.5-0.9 kg). This usually means reducing your daily calorie intake by 500-750 calories.  The number of calories in a food can be found on a Nutrition Facts label. If a food does not have a Nutrition Facts label, try to look up the calories online or ask your dietitian for help.  Use smaller plates, glasses, and bowls for smaller portions  and to prevent overeating.  Use your calories on foods and drinks that will fill you up  and not leave you hungry shortly after a meal. This information is not intended to replace advice given to you by your health care provider. Make sure you discuss any questions you have with your health care provider. Document Revised: 05/21/2019 Document Reviewed: 05/21/2019 Elsevier Patient Education  2021 [employer].

## 2020-08-06 ENCOUNTER — Encounter: Payer: Self-pay | Admitting: Family Medicine

## 2020-08-06 NOTE — Progress Notes (Signed)
   Sheri Jones     MRN: [id number]      DOB: [birth year]   HPI [name] is here for follow up and re-evaluation of chronic medical conditions, medication management and review of any available recent lab and radiology data.  Preventive health is updated, specifically  Cancer screening and Immunization.   Questions or concerns regarding consultations or procedures which the PT has had in the interim are  addressed. The PT denies any adverse reactions to current medications since the last visit.  There are no new concerns.  There are no specific complaints   ROS Denies recent fever or chills. Denies sinus pressure, nasal congestion, ear pain or sore throat. Denies chest congestion, productive cough or wheezing. Denies chest pains, palpitations and leg swelling Denies abdominal pain, nausea, vomiting,diarrhea or constipation.   Denies dysuria, frequency, hesitancy or incontinence. C/o  joint pain, swelling and limitation in mobility. Denies headaches, seizures, numbness, or tingling. Denies depression, anxiety or insomnia. Denies skin break down or rash.   PE  BP 130/82   Pulse 78   Temp 97.8 F (36.6 C) (Temporal)   Ht 5\' 3"  (1.6 m)   Wt 218 lb (98.9 kg)   SpO2 97%   BMI 38.62 kg/m   Patient alert and oriented and in no cardiopulmonary distress.  HEENT: No facial asymmetry, EOMI,     Neck supple .  Chest: Clear to auscultation bilaterally.  CVS: S1, S2 no murmurs, no S3.Regular rate.  ABD: Soft non tender.   Ext: No edema  MS: Adequate  Though reduced ROM spine, shoulders, hips and knees.  Skin: Intact, no ulcerations or rash noted.  Psych: Good eye contact, normal affect. Memory intact not anxious or depressed appearing.  CNS: CN 2-12 intact, power,  normal throughout.no focal deficits noted.   Assessment & Plan  Essential hypertension Controlled, no change in medication DASH diet and commitment to daily physical activity for a minimum of 30 minutes  discussed and encouraged, as a part of hypertension management. The importance of attaining a healthy weight is also discussed.  BP/Weight 08/04/2020 07/25/2020 07/12/2020 06/24/2020 05/12/2020 04/21/2020 35/08/7320  Systolic BP 025 427 - 062 376 283 151  Diastolic BP 82 58 - 73 76 93 98  Wt. (Lbs) 218 - 217.8 - 218 - -  BMI 38.62 - 37.39 - 38.62 - -       Morbid obesity (HCC) Obesity linked with hTN and arthritis  Patient re-educated about  the importance of commitment to a  minimum of 150 minutes of exercise per week as able.  The importance of healthy food choices with portion control discussed, as well as eating regularly and within a 12 hour window most days. The need to choose "clean , green" food 50 to 75% of the time is discussed, as well as to make water the primary drink and set a goal of 64 ounces water daily.    Weight /BMI 08/04/2020 07/12/2020 05/12/2020  WEIGHT 218 lb 217 lb 12.8 oz 218 lb  HEIGHT 5\' 3"  5\' 4"  5\' 3"   BMI 38.62 kg/m2 37.39 kg/m2 38.62 kg/m2      Chronic pain of both knees Improved in right knee has had replacement, worsening in left , working on weight loss

## 2020-08-06 NOTE — Assessment & Plan Note (Signed)
Controlled, no change in medication DASH diet and commitment to daily physical activity for a minimum of 30 minutes discussed and encouraged, as a part of hypertension management. The importance of attaining a healthy weight is also discussed.  BP/Weight 08/04/2020 07/25/2020 07/12/2020 06/24/2020 05/12/2020 04/21/2020 06/26/4654  Systolic BP 812 751 - 700 174 944 967  Diastolic BP 82 58 - 73 76 93 98  Wt. (Lbs) 218 - 217.8 - 218 - -  BMI 38.62 - 37.39 - 38.62 - -

## 2020-08-06 NOTE — Assessment & Plan Note (Signed)
Obesity linked with hTN and arthritis  Patient re-educated about  the importance of commitment to a  minimum of 150 minutes of exercise per week as able.  The importance of healthy food choices with portion control discussed, as well as eating regularly and within a 12 hour window most days. The need to choose "clean , green" food 50 to 75% of the time is discussed, as well as to make water the primary drink and set a goal of 64 ounces water daily.    Weight /BMI 08/04/2020 07/12/2020 05/12/2020  WEIGHT 218 lb 217 lb 12.8 oz 218 lb  HEIGHT 5\' 3"  5\' 4"  5\' 3"   BMI 38.62 kg/m2 37.39 kg/m2 38.62 kg/m2

## 2020-08-06 NOTE — Assessment & Plan Note (Signed)
Improved in right knee has had replacement, worsening in left , working on weight loss

## 2020-08-15 ENCOUNTER — Telehealth: Payer: Self-pay | Admitting: Internal Medicine

## 2020-08-15 NOTE — Telephone Encounter (Signed)
[phone] patient needs to be called about rescheduling her appointment

## 2020-08-15 NOTE — Telephone Encounter (Signed)
Tried to call pt back.  No answer and unable to leave a message.

## 2020-08-16 NOTE — Telephone Encounter (Signed)
Spoke with pt and she requested to reschedule her procedure.  She is going to be out of town.  She rescheduled her Covid test to 09/26/2020 at 11:00.  She rescheduled her procedure to 09/28/2020 with arrival time at 12:00.  Pt aware that I am mailing out new instructions.  Confirmed address.  [facility] for [facility] in [place].

## 2020-08-19 ENCOUNTER — Other Ambulatory Visit: Payer: Self-pay

## 2020-08-19 ENCOUNTER — Encounter: Payer: Self-pay | Admitting: Internal Medicine

## 2020-08-19 ENCOUNTER — Ambulatory Visit (INDEPENDENT_AMBULATORY_CARE_PROVIDER_SITE_OTHER): Payer: Federal, State, Local not specified - PPO | Admitting: Internal Medicine

## 2020-08-19 DIAGNOSIS — J019 Acute sinusitis, unspecified: Secondary | ICD-10-CM | POA: Diagnosis not present

## 2020-08-19 MED ORDER — AZITHROMYCIN 250 MG PO TABS: ORAL_TABLET | ORAL | 0 refills | Status: AC

## 2020-08-19 MED ORDER — SALINE SPRAY 0.65 % NA SOLN: 1.0000 | NASAL | 0 refills | Status: DC | PRN

## 2020-08-19 NOTE — Progress Notes (Signed)
Virtual Visit via Telephone Note   This visit type was conducted due to national recommendations for restrictions regarding the COVID-19 Pandemic (e.g. social distancing) in an effort to limit this patient's exposure and mitigate transmission in our community.  Due to her co-morbid illnesses, this patient is at least at moderate risk for complications without adequate follow up.  This format is felt to be most appropriate for this patient at this time.  The patient did not have access to video technology/had technical difficulties with video requiring transitioning to audio format only (telephone).  All issues noted in this document were discussed and addressed.  No physical exam could be performed with this format.  Evaluation Performed:  Follow-up visit  Date:  08/19/2020   ID:  Sheri Jones, DOB 07/21/1945, MRN [id number]  Patient Location: Home Provider Location: Office/Clinic  Participants: Patient Location of Patient: Home Location of Provider: Telehealth Consent was obtain for visit to be over via telehealth. I verified that I am speaking with the correct person using two identifiers.  PCP:  Fayrene Helper, MD   Chief Complaint:  Nasal congestion  History of Present Illness:    Sheri Jones is a 75 y.o. female who has a televisit for c/o nasal congestion and sinus pressure for about a week. She went to Urgent care and was given Tessalon for cough. She had negative COVID and flu test. She had fever initially, which has resolved now. She is up-to-date with COVID test.  The patient does not have symptoms concerning for COVID-19 infection (fever, chills, cough, or new shortness of breath).   Past Medical, Surgical, Social History, Allergies, and Medications have been Reviewed.  Past Medical History:  Diagnosis Date  . Allergic rhinitis due to pollen   . Allergy    Phreesia 02/01/2020  . Anemia   . Arthritis    Phreesia 02/01/2020  . Diabetes mellitus without  complication ([place])   . 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 Hypertension    Phreesia 02/01/2020  . Obesity, unspecified   . Osteoarthrosis, unspecified whether generalized or localized, lower leg   . Unspecified essential hypertension   . Vitamin B12 deficiency disease 10/28/2008   Qualifier: Diagnosis of  By: Moshe Cipro MD, Joycelyn Schmid     Past Surgical History:  Procedure Laterality Date  . COLONOSCOPY  2008  . EXAM UNDER ANESTHESIA WITH MANIPULATION OF KNEE Right 05/11/2015   Procedure: EXAM UNDER ANESTHESIA WITH MANIPULATION OF KNEE;  Surgeon: Carole Civil, MD;  Location: [facility];  Service: Orthopedics;  Laterality: Right;  . EYE SURGERY Left 04/27/2018   cataract  . EYE SURGERY Right 06/17/2018   cataract  . GASTRIC BYPASS  2004  . JOINT REPLACEMENT N/A    Phreesia 02/01/2020  . TOTAL KNEE ARTHROPLASTY Right 02/23/2015   Procedure: RIGHT TOTAL KNEE ARTHROPLASTY;  Surgeon: Carole Civil, MD;  Location: [facility];  Service: Orthopedics;  Laterality: Right;  . TOTAL KNEE ARTHROPLASTY Left 03/24/2019   Procedure: TOTAL KNEE ARTHROPLASTY;  Surgeon: Carole Civil, MD;  Location: [facility];  Service: Orthopedics;  Laterality: Left;     Current Meds  Medication Sig  . benzonatate (TESSALON) 100 MG capsule Take 100 mg by mouth 3 (three) times daily as needed.  . Calcium Carb-Cholecalciferol (CALCIUM 600+D) 600-800 MG-UNIT TABS Take 2 tablets by mouth daily.  . cetirizine (ZYRTEC) 10 MG tablet Take 10 mg by mouth daily as needed for allergies.   . cholecalciferol (VITAMIN D3)  25 MCG (1000 UT) tablet Take 1,000 Units by mouth daily.  . cyanocobalamin (,VITAMIN B-12,) 1000 MCG/ML injection Inject 1,000 mcg into the muscle every 30 (thirty) days.  . diphenhydrAMINE (BENADRYL) 25 MG tablet Take 25 mg by mouth as needed.  . hydrochlorothiazide (HYDRODIURIL) 25 MG tablet TAKE 1 TABLET BY MOUTH EVERY DAY  . Multiple Vitamins-Minerals (CENTRUM ULTRA WOMENS PO) Take 1 tablet by mouth  daily.  . pantoprazole (PROTONIX) 20 MG tablet Take 20 mg by mouth as needed.  . potassium chloride (KLOR-CON) 10 MEQ tablet TAKE 1 TABLET (10 MEQ TOTAL) BY MOUTH 2 (TWO) TIMES DAILY.  Marland Kitchen azithromycin (ZITHROMAX) 250 MG tablet Take 2 tablets on day 1, then 1 tablet daily on days 2 through 5  . sodium chloride (OCEAN) 0.65 % SOLN nasal spray Place 1 spray into both nostrils as needed for congestion.     Allergies:   Patient has no known allergies.   ROS:   Please see the history of present illness.     All other systems reviewed and are negative.   Labs/Other Tests and Data Reviewed:    Recent Labs: 09/22/2019: Magnesium 1.6 01/28/2020: TSH 1.39 06/24/2020: ALT 25; Hemoglobin 13.5; Platelets 174 07/28/2020: BUN 22; Creat 0.81; Potassium 3.9; Sodium 142   Recent Lipid Panel Lab Results  Component Value Date/Time   CHOL 196 01/28/2020 10:02 AM   TRIG 72 01/28/2020 10:02 AM   HDL 75 01/28/2020 10:02 AM   CHOLHDL 2.6 01/28/2020 10:02 AM   LDLCALC 105 (H) 01/28/2020 10:02 AM    Wt Readings from Last 3 Encounters:  08/04/20 218 lb (98.9 kg)  07/12/20 217 lb 12.8 oz (98.8 kg)  05/12/20 218 lb (98.9 kg)     ASSESSMENT & PLAN:    Acute sinusitis Persistent symptoms despite symptomatic treatment COVID and flu negative Started Azithromycin Nasal saline spray for nasal congestion Advised to use humidifier at nighttime  Time:   Today, I have spent 9 minutes reviewing the chart, including problem list, medications, and with the patient with telehealth technology discussing the above problems.   Medication Adjustments/Labs and Tests Ordered: Current medicines are reviewed at length with the patient today.  Concerns regarding medicines are outlined above.   Tests Ordered: No orders of the defined types were placed in this encounter.   Medication Changes: Meds ordered this encounter  Medications  . azithromycin (ZITHROMAX) 250 MG tablet    Sig: Take 2 tablets on day 1, then 1  tablet daily on days 2 through 5    Dispense:  6 tablet    Refill:  0  . sodium chloride (OCEAN) 0.65 % SOLN nasal spray    Sig: Place 1 spray into both nostrils as needed for congestion.    Dispense:  15 mL    Refill:  0     Note: This dictation was prepared with Dragon dictation along with smaller phrase technology. Similar sounding words can be transcribed inadequately or may not be corrected upon review. Any transcriptional errors that result from this process are unintentional.      Disposition:  Follow up  Signed, Lindell Spar, MD  08/19/2020 9:10 AM     [facility]

## 2020-08-19 NOTE — Patient Instructions (Signed)

## 2020-08-24 ENCOUNTER — Encounter (HOSPITAL_COMMUNITY): Payer: Self-pay

## 2020-08-24 ENCOUNTER — Inpatient Hospital Stay (HOSPITAL_COMMUNITY): Payer: Federal, State, Local not specified - PPO | Attending: Hematology

## 2020-08-24 ENCOUNTER — Other Ambulatory Visit: Payer: Self-pay

## 2020-08-24 VITALS — BP 132/70 | HR 75 | Temp 97.1°F

## 2020-08-24 DIAGNOSIS — E538 Deficiency of other specified B group vitamins: Secondary | ICD-10-CM | POA: Diagnosis not present

## 2020-08-24 MED ORDER — CYANOCOBALAMIN 1000 MCG/ML IJ SOLN
1000.0000 ug | Freq: Once | INTRAMUSCULAR | Status: AC
  Administered 2020-08-24: 1000 ug via INTRAMUSCULAR

## 2020-08-24 MED ORDER — CYANOCOBALAMIN 1000 MCG/ML IJ SOLN
INTRAMUSCULAR | Status: AC
  Filled 2020-08-24: qty 1

## 2020-08-24 NOTE — Progress Notes (Signed)
Patient tolerated injection with no complaints voiced.  Site clean and dry with no bruising or swelling noted at site.  See MAR for details.  Band aid applied.  Patient stable during and after injection.  Vss with discharge and left in satisfactory condition with no s/s of distress noted.  

## 2020-08-24 NOTE — Patient Instructions (Signed)
[facility]  Discharge Instructions: Thank you for choosing [facility] to provide your oncology and hematology care.  If you have a lab appointment with the [facility], please come in thru the Main Entrance and check in at the main information desk.  Wear comfortable clothing and clothing appropriate for easy access to any Portacath or PICC line.   We strive to give you quality time with your provider. You may need to reschedule your appointment if you arrive late (15 or more minutes).  Arriving late affects you and other patients whose appointments are after yours.  Also, if you miss three or more appointments without notifying the office, you may be dismissed from the clinic at the provider's discretion.      For prescription refill requests, have your pharmacy contact our office and allow 72 hours for refills to be completed.    Today you received the following Vitamin B12 injection today.     To help prevent nausea and vomiting after your treatment, we encourage you to take your nausea medication as directed.  BELOW ARE SYMPTOMS THAT SHOULD BE REPORTED IMMEDIATELY: . *FEVER GREATER THAN 100.4 F (38 C) OR HIGHER . *CHILLS OR SWEATING . *NAUSEA AND VOMITING THAT IS NOT CONTROLLED WITH YOUR NAUSEA MEDICATION . *UNUSUAL SHORTNESS OF BREATH . *UNUSUAL BRUISING OR BLEEDING . *URINARY PROBLEMS (pain or burning when urinating, or frequent urination) . *BOWEL PROBLEMS (unusual diarrhea, constipation, pain near the anus) . TENDERNESS IN MOUTH AND THROAT WITH OR WITHOUT PRESENCE OF ULCERS (sore throat, sores in mouth, or a toothache) . UNUSUAL RASH, SWELLING OR PAIN  . UNUSUAL VAGINAL DISCHARGE OR ITCHING   Items with * indicate a potential emergency and should be followed up as soon as possible or go to the Emergency Department if any problems should occur.  Please show the CHEMOTHERAPY ALERT CARD or IMMUNOTHERAPY ALERT CARD at check-in to the Emergency  Department and triage nurse.  Should you have questions after your visit or need to cancel or reschedule your appointment, please contact [facility] [phone]  and follow the prompts.  Office hours are 8:00 a.m. to 4:30 p.m. Monday - Friday. Please note that voicemails left after 4:00 p.m. may not be returned until the following business day.  We are closed weekends and major holidays. You have access to a nurse at all times for urgent questions. Please call the main number to the clinic [phone] and follow the prompts.  For any non-urgent questions, you may also contact your provider using MyChart. We now offer e-Visits for anyone 6 and older to request care online for non-urgent symptoms. For details visit [email or web address].   Also download the MyChart app! Go to the app store, search "MyChart", open the app, select [facility], and log in with your MyChart username and password.  Due to Covid, a mask is required upon entering the hospital/clinic. If you do not have a mask, one will be given to you upon arrival. For doctor visits, patients may have 1 support person aged 53 or older with them. For treatment visits, patients cannot have anyone with them due to current Covid guidelines and our immunocompromised population.

## 2020-08-29 ENCOUNTER — Other Ambulatory Visit (HOSPITAL_COMMUNITY): Payer: Self-pay

## 2020-09-15 ENCOUNTER — Telehealth (INDEPENDENT_AMBULATORY_CARE_PROVIDER_SITE_OTHER): Payer: Federal, State, Local not specified - PPO | Admitting: Family Medicine

## 2020-09-15 ENCOUNTER — Encounter: Payer: Self-pay | Admitting: Family Medicine

## 2020-09-15 ENCOUNTER — Other Ambulatory Visit: Payer: Self-pay

## 2020-09-15 VITALS — Ht 63.0 in | Wt 215.0 lb

## 2020-09-15 DIAGNOSIS — R0600 Dyspnea, unspecified: Secondary | ICD-10-CM

## 2020-09-15 DIAGNOSIS — E785 Hyperlipidemia, unspecified: Secondary | ICD-10-CM

## 2020-09-15 DIAGNOSIS — I10 Essential (primary) hypertension: Secondary | ICD-10-CM

## 2020-09-15 DIAGNOSIS — R0609 Other forms of dyspnea: Secondary | ICD-10-CM

## 2020-09-15 NOTE — Patient Instructions (Signed)
F/U as before, call if you need me sooner  You are referred to Cardiology for evaluation  Increase watery  Vegetables, and reduce starchy food, sugars and sweets  Thanks for choosing [facility], we consider it a privelige to serve you.

## 2020-09-15 NOTE — Progress Notes (Signed)
Virtual Visit via Telephone Note  I connected with Sheri Jones on 09/15/20 at 10:40 AM EDT by telephone and verified that I am speaking with the correct person using two identifiers.  Location: Patient: home Provider: office   I discussed the limitations, risks, security and privacy concerns of performing an evaluation and management service by telephone and the availability of in person appointments. I also discussed with the patient that there may be a patient responsible charge related to this service. The patient expressed understanding and agreed to proceed.   History of Present Illness: C/o 1 month h/o  intermittent feelings of fatigue and tiredness especially on first awakening, and also sometimes when she walks she has to stop and catch her breath Denies chest pain, no orthopnea or PND no edema Sibling dx with cAD in her 54's , she has dyslipidemia, hyperlipidemia, and PDB Observations/Objective: Ht 5\' 3"  (1.6 m)   Wt 215 lb (97.5 kg)   BMI 38.09 kg/m  Good communication with no confusion and intact memory. Alert and oriented x 3 No signs of respiratory distress during speech    Assessment and Plan: Dyspnea on exertion 1 month h/o exertional fatigue, pos f/h of cAD and pt has dylipidemia and HTN , refer cardiology  Essential hypertension Controlled, no change in medication DASH diet and commitment to daily physical activity for a minimum of 30 minutes discussed and encouraged, as a part of hypertension management. The importance of attaining a healthy weight is also discussed.  BP/Weight 09/15/2020 08/24/2020 08/04/2020 07/25/2020 07/12/2020 06/24/2020 6/38/4536  Systolic BP - 468 032 122 - 482 500  Diastolic BP - 70 82 58 - 73 76  Wt. (Lbs) 215 - 218 - 217.8 - 218  BMI 38.09 - 38.62 - 37.39 - 38.62       Morbid obesity (HCC)  Patient re-educated about  the importance of commitment to a  minimum of 150 minutes of exercise per week as able.  The importance of healthy  food choices with portion control discussed, as well as eating regularly and within a 12 hour window most days. The need to choose "clean , green" food 50 to 75% of the time is discussed, as well as to make water the primary drink and set a goal of 64 ounces water daily.    Weight /BMI 09/15/2020 08/04/2020 07/12/2020  WEIGHT 215 lb 218 lb 217 lb 12.8 oz  HEIGHT 5\' 3"  5\' 3"  5\' 4"   BMI 38.09 kg/m2 38.62 kg/m2 37.39 kg/m2        Follow Up Instructions:    I discussed the assessment and treatment plan with the patient. The patient was provided an opportunity to ask questions and all were answered. The patient agreed with the plan and demonstrated an understanding of the instructions.   The patient was advised to call back or seek an in-person evaluation if the symptoms worsen or if the condition fails to improve as anticipated.  I provided 12 minutes of non-face-to-face time during this encounter.   Tula Nakayama, MD

## 2020-09-15 NOTE — Assessment & Plan Note (Signed)
1 month h/o exertional fatigue, pos f/h of cAD and pt has dylipidemia and HTN , refer cardiology

## 2020-09-15 NOTE — Assessment & Plan Note (Signed)
Controlled, no change in medication DASH diet and commitment to daily physical activity for a minimum of 30 minutes discussed and encouraged, as a part of hypertension management. The importance of attaining a healthy weight is also discussed.  BP/Weight 09/15/2020 08/24/2020 08/04/2020 07/25/2020 07/12/2020 06/24/2020 5/39/7673  Systolic BP - 419 379 024 - 097 353  Diastolic BP - 70 82 58 - 73 76  Wt. (Lbs) 215 - 218 - 217.8 - 218  BMI 38.09 - 38.62 - 37.39 - 38.62

## 2020-09-15 NOTE — Assessment & Plan Note (Signed)
  Patient re-educated about  the importance of commitment to a  minimum of 150 minutes of exercise per week as able.  The importance of healthy food choices with portion control discussed, as well as eating regularly and within a 12 hour window most days. The need to choose "clean , green" food 50 to 75% of the time is discussed, as well as to make water the primary drink and set a goal of 64 ounces water daily.    Weight /BMI 09/15/2020 08/04/2020 07/12/2020  WEIGHT 215 lb 218 lb 217 lb 12.8 oz  HEIGHT 5\' 3"  5\' 3"  5\' 4"   BMI 38.09 kg/m2 38.62 kg/m2 37.39 kg/m2

## 2020-09-22 ENCOUNTER — Other Ambulatory Visit (HOSPITAL_COMMUNITY): Payer: Self-pay | Admitting: *Deleted

## 2020-09-23 ENCOUNTER — Inpatient Hospital Stay (HOSPITAL_COMMUNITY): Payer: Federal, State, Local not specified - PPO | Attending: Hematology

## 2020-09-23 ENCOUNTER — Other Ambulatory Visit: Payer: Self-pay

## 2020-09-23 VITALS — BP 160/87 | HR 78 | Temp 97.1°F | Resp 18

## 2020-09-23 DIAGNOSIS — E538 Deficiency of other specified B group vitamins: Secondary | ICD-10-CM | POA: Diagnosis present

## 2020-09-23 MED ORDER — CYANOCOBALAMIN 1000 MCG/ML IJ SOLN
1000.0000 ug | Freq: Once | INTRAMUSCULAR | Status: AC
  Administered 2020-09-23: 1000 ug via INTRAMUSCULAR
  Filled 2020-09-23: qty 1

## 2020-09-23 NOTE — Patient Instructions (Signed)
Sheri Jones  Discharge Instructions: Thank you for choosing [facility] to provide your oncology and hematology care.  If you have a lab appointment with the [facility], please come in thru the Main Entrance and check in at the main information desk.  Wear comfortable clothing and clothing appropriate for easy access to any Portacath or PICC line.   We strive to give you quality time with your provider. You may need to reschedule your appointment if you arrive late (15 or more minutes).  Arriving late affects you and other patients whose appointments are after yours.  Also, if you miss three or more appointments without notifying the office, you may be dismissed from the clinic at the provider's discretion.      For prescription refill requests, have your pharmacy contact our office and allow 72 hours for refills to be completed.       To help prevent nausea and vomiting after your treatment, we encourage you to take your nausea medication as directed.  Items with * indicate a potential emergency and should be followed up as soon as possible or go to the Emergency Department if any problems should occur.    Should you have questions after your visit or need to cancel or reschedule your appointment, please contact [facility] [phone]  and follow the prompts.  Office hours are 8:00 a.m. to 4:30 p.m. Monday - Friday. Please note that voicemails left after 4:00 p.m. may not be returned until the following business day.  We are closed weekends and major holidays. You have access to a nurse at all times for urgent questions. Please call the main number to the clinic [phone] and follow the prompts.  For any non-urgent questions, you may also contact your provider using MyChart. We now offer e-Visits for anyone 45 and older to request care online for non-urgent symptoms. For details visit [email or web address].   Also download the MyChart app! Go to  the app store, search "MyChart", open the app, select [facility], and log in with your MyChart username and password.  Due to Covid, a mask is required upon entering the hospital/clinic. If you do not have a mask, one will be given to you upon arrival. For doctor visits, patients may have 1 support person aged 37 or older with them. For treatment visits, patients cannot have anyone with them due to current Covid guidelines and our immunocompromised population.

## 2020-09-26 ENCOUNTER — Other Ambulatory Visit (HOSPITAL_COMMUNITY): Admission: RE | Admit: 2020-09-26 | Payer: Federal, State, Local not specified - PPO | Source: Ambulatory Visit

## 2020-09-28 ENCOUNTER — Encounter (HOSPITAL_COMMUNITY)
Admission: RE | Disposition: A | Discharge status: Home / Self Care | Payer: Self-pay | Source: Home / Self Care | Attending: Internal Medicine

## 2020-09-28 ENCOUNTER — Ambulatory Visit (HOSPITAL_COMMUNITY)
Admission: RE | Admit: 2020-09-28 | Discharge: 2020-09-28 | Disposition: A | Discharge status: Home / Self Care | Payer: Federal, State, Local not specified - PPO | Attending: Internal Medicine | Admitting: Internal Medicine

## 2020-09-28 ENCOUNTER — Other Ambulatory Visit: Payer: Self-pay

## 2020-09-28 ENCOUNTER — Encounter (HOSPITAL_COMMUNITY): Payer: Self-pay | Admitting: Internal Medicine

## 2020-09-28 DIAGNOSIS — Z79899 Other long term (current) drug therapy: Secondary | ICD-10-CM | POA: Diagnosis not present

## 2020-09-28 DIAGNOSIS — K5730 Diverticulosis of large intestine without perforation or abscess without bleeding: Secondary | ICD-10-CM | POA: Insufficient documentation

## 2020-09-28 DIAGNOSIS — Z96653 Presence of artificial knee joint, bilateral: Secondary | ICD-10-CM | POA: Insufficient documentation

## 2020-09-28 DIAGNOSIS — Z9884 Bariatric surgery status: Secondary | ICD-10-CM | POA: Insufficient documentation

## 2020-09-28 DIAGNOSIS — Z1211 Encounter for screening for malignant neoplasm of colon: Secondary | ICD-10-CM

## 2020-09-28 HISTORY — PX: COLONOSCOPY: SHX5424

## 2020-09-28 LAB — GLUCOSE, CAPILLARY: Glucose-Capillary: 86 mg/dL (ref 70–99)

## 2020-09-28 SURGERY — COLONOSCOPY
Anesthesia: Moderate Sedation

## 2020-09-28 MED ORDER — ONDANSETRON HCL 4 MG/2ML IJ SOLN
INTRAMUSCULAR | Status: AC
  Filled 2020-09-28: qty 2

## 2020-09-28 MED ORDER — MEPERIDINE HCL 100 MG/ML IJ SOLN
INTRAMUSCULAR | Status: DC | PRN
  Administered 2020-09-28: 25 mg
  Administered 2020-09-28: 15 mg

## 2020-09-28 MED ORDER — MIDAZOLAM HCL 5 MG/5ML IJ SOLN
INTRAMUSCULAR | Status: DC | PRN
  Administered 2020-09-28: 1 mg via INTRAVENOUS
  Administered 2020-09-28: 2 mg via INTRAVENOUS
  Administered 2020-09-28: 1 mg via INTRAVENOUS

## 2020-09-28 MED ORDER — SODIUM CHLORIDE 0.9 % IV SOLN: INTRAVENOUS | Status: DC

## 2020-09-28 MED ORDER — MEPERIDINE HCL 50 MG/ML IJ SOLN
INTRAMUSCULAR | Status: AC
  Filled 2020-09-28: qty 1

## 2020-09-28 MED ORDER — STERILE WATER FOR IRRIGATION IR SOLN
Status: DC | PRN
  Administered 2020-09-28: 100 mL

## 2020-09-28 MED ORDER — ONDANSETRON HCL 4 MG/2ML IJ SOLN
INTRAMUSCULAR | Status: DC | PRN
  Administered 2020-09-28: 4 mg via INTRAVENOUS

## 2020-09-28 MED ORDER — MIDAZOLAM HCL 5 MG/5ML IJ SOLN
INTRAMUSCULAR | Status: AC
  Filled 2020-09-28: qty 10

## 2020-09-28 NOTE — H&P (Signed)
@LOGO @   Primary Care Physician:  Fayrene Helper, MD Primary Gastroenterologist:  Dr. Gala Romney  Pre-Procedure History & Physical: HPI:  Sheri Jones is a 76 y.o. female is here for a screening colonoscopy.  Negative colonoscopy 10 years ago.  No bowel symptoms.  No family history of colon cancer in any first-degree relatives although she does have a history in a single second-degree relative (aunt) but at advanced age. Past Medical History:  Diagnosis Date  . Allergic rhinitis due to pollen   . Allergy    Phreesia 02/01/2020  . Anemia   . Arthritis    Phreesia 02/01/2020  . Diabetes mellitus without complication ([place])   . 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 Hypertension    Phreesia 02/01/2020  . Obesity, unspecified   . Osteoarthrosis, unspecified whether generalized or localized, lower leg   . Unspecified essential hypertension   . Vitamin B12 deficiency disease 10/28/2008   Qualifier: Diagnosis of  By: Moshe Cipro MD, Joycelyn Schmid      Past Surgical History:  Procedure Laterality Date  . COLONOSCOPY  2008  . EXAM UNDER ANESTHESIA WITH MANIPULATION OF KNEE Right 05/11/2015   Procedure: EXAM UNDER ANESTHESIA WITH MANIPULATION OF KNEE;  Surgeon: Carole Civil, MD;  Location: [facility];  Service: Orthopedics;  Laterality: Right;  . EYE SURGERY Left 04/27/2018   cataract  . EYE SURGERY Right 06/17/2018   cataract  . GASTRIC BYPASS  2004  . JOINT REPLACEMENT N/A    Phreesia 02/01/2020  . TOTAL KNEE ARTHROPLASTY Right 02/23/2015   Procedure: RIGHT TOTAL KNEE ARTHROPLASTY;  Surgeon: Carole Civil, MD;  Location: [facility];  Service: Orthopedics;  Laterality: Right;  . TOTAL KNEE ARTHROPLASTY Left 03/24/2019   Procedure: TOTAL KNEE ARTHROPLASTY;  Surgeon: Carole Civil, MD;  Location: [facility];  Service: Orthopedics;  Laterality: Left;    Prior to Admission medications   Medication Sig Start Date End Date Taking? Authorizing Provider  Calcium Carb-Cholecalciferol  (CALCIUM 600+D) 600-800 MG-UNIT TABS Take 2 tablets by mouth daily.   Yes [provider]  cetirizine (ZYRTEC) 10 MG tablet Take 10 mg by mouth daily as needed for allergies.    Yes [provider]  cholecalciferol (VITAMIN D3) 25 MCG (1000 UT) tablet Take 1,000 Units by mouth daily.   Yes [provider]  cyanocobalamin (,VITAMIN B-12,) 1000 MCG/ML injection Inject 1,000 mcg into the muscle every 30 (thirty) days.   Yes [provider]  diphenhydrAMINE (BENADRYL) 25 MG tablet Take 25 mg by mouth every 8 (eight) hours as needed for allergies.   Yes [provider]  hydrochlorothiazide (HYDRODIURIL) 25 MG tablet TAKE 1 TABLET BY MOUTH EVERY DAY Patient taking differently: Take 25 mg by mouth daily. 07/07/20  Yes Fayrene Helper, MD  Multiple Vitamins-Minerals (CENTRUM ULTRA WOMENS PO) Take 1 tablet by mouth daily.   Yes [provider]  pantoprazole (PROTONIX) 20 MG tablet Take 20 mg by mouth daily as needed for heartburn or indigestion.   Yes [provider]  potassium chloride (KLOR-CON) 10 MEQ tablet TAKE 1 TABLET (10 MEQ TOTAL) BY MOUTH 2 (TWO) TIMES DAILY. 06/27/20  Yes Fayrene Helper, MD  acetaminophen (TYLENOL) 325 MG tablet Take 650 mg by mouth every 6 (six) hours as needed for mild pain.    [provider]  sodium chloride (OCEAN) 0.65 % SOLN nasal spray Place 1 spray into both nostrils as needed for congestion. Patient not taking: No sig reported 08/19/20  Lindell Spar, MD  temazepam (RESTORIL) 7.5 MG capsule Take 1 capsule (7.5 mg total) by mouth at bedtime as needed for sleep. Patient not taking: Reported on 05/19/2019 04/29/19 09/22/19  Fayrene Helper, MD    Allergies as of 07/19/2020  . (No Known Allergies)    Family History  Problem Relation Age of Onset  . Hypertension Mother   . Hypertension Sister   . Hypertension Sister   . Stroke Sister 1  . Hypertension Sister   . Heart disease Father 25        massive heart attack   . Breast cancer Maternal Aunt   . Lung cancer Maternal Aunt     Social History   Socioeconomic History  . Marital status: Single    Spouse name: Not on file  . Number of children: Not on file  . Years of education: Not on file  . Highest education level: Not on file  Occupational History  . Not on file  Tobacco Use  . Smoking status: Never Smoker  . Smokeless tobacco: Never Used  Vaping Use  . Vaping Use: Never used  Substance and Sexual Activity  . Alcohol use: No  . Drug use: No  . Sexual activity: Not Currently  Other Topics Concern  . Not on file  Social History Narrative  . Not on file   Social Determinants of Health   Financial Resource Strain: Low Risk   . Difficulty of Paying Living Expenses: Not hard at all  Food Insecurity: No Food Insecurity  . Worried About Charity fundraiser in the Last Year: Never true  . Ran Out of Food in the Last Year: Never true  Transportation Needs: No Transportation Needs  . Lack of Transportation (Medical): No  . Lack of Transportation (Non-Medical): No  Physical Activity: Insufficiently Active  . Days of Exercise per Week: 7 days  . Minutes of Exercise per Session: 20 min  Stress: No Stress Concern Present  . Feeling of Stress : Not at all  Social Connections: Moderately Isolated  . Frequency of Communication with Friends and Family: More than three times a week  . Frequency of Social Gatherings with Friends and Family: More than three times a week  . Attends Religious Services: More than 4 times per year  . Active Member of Clubs or Organizations: No  . Attends Archivist Meetings: Never  . Marital Status: Never married  Intimate Partner Violence: Not At Risk  . Fear of Current or Ex-Partner: No  . Emotionally Abused: No  . Physically Abused: No  . Sexually Abused: No    Review of Systems: See HPI, otherwise negative ROS  Physical Exam: BP (!) 165/83   Pulse 98   Temp 98  F (36.7 C) (Oral)   Resp 19   Ht 5\' 3"  (1.6 m)   Wt 97.5 kg   SpO2 99%   BMI 38.09 kg/m  General:   Alert,  Well-developed, well-nourished, pleasant and cooperative in NAD Heart:  Regular rate and rhythm; no murmurs, clicks, rubs,  or gallops. Abdomen:  Soft, nontender and nondistended. No masses, hepatosplenomegaly or hernias noted. Normal bowel sounds, without guarding, and without rebound.   Impression/Plan:  Sheri Jones is now here to undergo a screening colonoscopy.  Average risk screening examination.  Risks, benefits, limitations, imponderables and alternatives regarding colonoscopy have been reviewed with the patient. Questions have been answered. All parties agreeable.     Notice:  This dictation was  prepared with Dragon dictation along with smaller phrase technology. Any transcriptional errors that result from this process are unintentional and may not be corrected upon review.

## 2020-09-28 NOTE — Discharge Instructions (Signed)
Colonoscopy Discharge Instructions  Read the instructions outlined below and refer to this sheet in the next few weeks. These discharge instructions provide you with general information on caring for yourself after you leave the hospital. Your doctor may also give you specific instructions. While your treatment has been planned according to the most current medical practices available, unavoidable complications occasionally occur. If you have any problems or questions after discharge, call Dr. Gala Romney at [phone]. ACTIVITY  You may resume your regular activity, but move at a slower pace for the next 24 hours.   Take frequent rest periods for the next 24 hours.   Walking will help get rid of the air and reduce the bloated feeling in your belly (abdomen).   No driving for 24 hours (because of the medicine (anesthesia) used during the test).    Do not sign any important legal documents or operate any machinery for 24 hours (because of the anesthesia used during the test).  NUTRITION  Drink plenty of fluids.   You may resume your normal diet as instructed by your doctor.   Begin with a light meal and progress to your normal diet. Heavy or fried foods are harder to digest and may make you feel sick to your stomach (nauseated).   Avoid alcoholic beverages for 24 hours or as instructed.  MEDICATIONS  You may resume your normal medications unless your doctor tells you otherwise.  WHAT YOU CAN EXPECT TODAY  Some feelings of bloating in the abdomen.   Passage of more gas than usual.   Spotting of blood in your stool or on the toilet paper.  IF YOU HAD POLYPS REMOVED DURING THE COLONOSCOPY:  No aspirin products for 7 days or as instructed.   No alcohol for 7 days or as instructed.   Eat a soft diet for the next 24 hours.  FINDING OUT THE RESULTS OF YOUR TEST Not all test results are available during your visit. If your test results are not back during the visit, make an appointment  with your caregiver to find out the results. Do not assume everything is normal if you have not heard from your caregiver or the medical facility. It is important for you to follow up on all of your test results.  SEEK IMMEDIATE MEDICAL ATTENTION IF:  You have more than a spotting of blood in your stool.   Your belly is swollen (abdominal distention).   You are nauseated or vomiting.   You have a temperature over 101.   You have abdominal pain or discomfort that is severe or gets worse throughout the day.   No polyps found in your colon  Diverticulosis information provided  Future colonoscopy is not recommended unless new symptoms develop  At patient request, I called [facility] at [phone].  Call rolled to voicemail.  Message left with findings and recommendations   Diverticulosis  Diverticulosis is a condition that develops when small pouches (diverticula) form in the wall of the large intestine (colon). The colon is where water is absorbed and stool (feces) is formed. The pouches form when the inside layer of the colon pushes through weak spots in the outer layers of the colon. You may have a few pouches or many of them. The pouches usually do not cause problems unless they become inflamed or infected. When this happens, the condition is called diverticulitis. What are the causes? The cause of this condition is not known. What increases the risk? The following factors may make you  more likely to develop this condition:  Being older than age 63. Your risk for this condition increases with age. Diverticulosis is rare among people younger than age 91. By age 33, many people have it.  Eating a low-fiber diet.  Having frequent constipation.  Being overweight.  Not getting enough exercise.  Smoking.  Taking over-the-counter pain medicines, like aspirin and ibuprofen.  Having a family history of diverticulosis. What are the signs or symptoms? In most people, there  are no symptoms of this condition. If you do have symptoms, they may include:  Bloating.  Cramps in the abdomen.  Constipation or diarrhea.  Pain in the lower left side of the abdomen. How is this diagnosed? Because diverticulosis usually has no symptoms, it is most often diagnosed during an exam for other colon problems. The condition may be diagnosed by:  Using a flexible scope to examine the colon (colonoscopy).  Taking an X-ray of the colon after dye has been put into the colon (barium enema).  Having a CT scan. How is this treated? You may not need treatment for this condition. Your health care provider may recommend treatment to prevent problems. You may need treatment if you have symptoms or if you previously had diverticulitis. Treatment may include:  Eating a high-fiber diet.  Taking a fiber supplement.  Taking a live bacteria supplement (probiotic).  Taking medicine to relax your colon.   Follow these instructions at home: Medicines  Take over-the-counter and prescription medicines only as told by your health care provider.  If told by your health care provider, take a fiber supplement or probiotic. Constipation prevention Your condition may cause constipation. To prevent or treat constipation, you may need to:  Drink enough fluid to keep your urine pale yellow.  Take over-the-counter or prescription medicines.  Eat foods that are high in fiber, such as beans, whole grains, and fresh fruits and vegetables.  Limit foods that are high in fat and processed sugars, such as fried or sweet foods.   General instructions  Try not to strain when you have a bowel movement.  Keep all follow-up visits as told by your health care provider. This is important. Contact a health care provider if you:  Have pain in your abdomen.  Have bloating.  Have cramps.  Have not had a bowel movement in 3 days. Get help right away if:  Your pain gets worse.  Your bloating  becomes very bad.  You have a fever or chills, and your symptoms suddenly get worse.  You vomit.  You have bowel movements that are bloody or black.  You have bleeding from your rectum. Summary  Diverticulosis is a condition that develops when small pouches (diverticula) form in the wall of the large intestine (colon).  You may have a few pouches or many of them.  This condition is most often diagnosed during an exam for other colon problems.  Treatment may include increasing the fiber in your diet, taking supplements, or taking medicines. This information is not intended to replace advice given to you by your health care provider. Make sure you discuss any questions you have with your health care provider. Document Revised: 11/06/2018 Document Reviewed: 11/06/2018 Elsevier Patient Education  [employer].

## 2020-09-28 NOTE — Op Note (Signed)
[facility] Patient Name: Sheri Jones Procedure Date: 09/28/2020 11:43 AM MRN: [id number] Date of Birth: [birth year] Attending MD: Norvel Richards , MD CSN: [id number] Age: 75 Admit Type: Outpatient Procedure:                Colonoscopy Indications:              Screening for colorectal malignant neoplasm Providers:                Norvel Richards, MD, Rosina Lowenstein, RN, Raphael Gibney, Technician Referring MD:              Medicines:                Midazolam 4 mg IV, Meperidine 40 mg IV Complications:            No immediate complications. Estimated Blood Loss:     Estimated blood loss: none. Procedure:                Pre-Anesthesia Assessment:                           - Prior to the procedure, a History and Physical                            was performed, and patient medications and                            allergies were reviewed. The patient's tolerance of                            previous anesthesia was also reviewed. The risks                            and benefits of the procedure and the sedation                            options and risks were discussed with the patient.                            All questions were answered, and informed consent                            was obtained. Prior Anticoagulants: The patient has                            taken no previous anticoagulant or antiplatelet                            agents. ASA Grade Assessment: II - A patient with                            mild systemic disease. After reviewing the risks  and benefits, the patient was deemed in                            satisfactory condition to undergo the procedure.                           After obtaining informed consent, the colonoscope                            was passed under direct vision. Throughout the                            procedure, the patient's blood pressure, pulse, and                             oxygen saturations were monitored continuously. The                            CF-HQ190L ([id number]) scope was introduced through                            the anus and advanced to the the cecum, identified                            by appendiceal orifice and ileocecal valve. The                            colonoscopy was performed without difficulty. The                            patient tolerated the procedure well. The quality                            of the bowel preparation was adequate. The                            ileocecal valve, appendiceal orifice, and rectum                            were photographed. The entire colon was well                            visualized. Scope In: 12:40:09 PM Scope Out: 12:52:35 PM Scope Withdrawal Time: 0 hours 7 minutes 24 seconds  Total Procedure Duration: 0 hours 12 minutes 26 seconds  Findings:      The perianal and digital rectal examinations were normal.      Scattered medium-mouthed diverticula were found in the sigmoid colon.      The exam was otherwise without abnormality on direct and retroflexion       views. Impression:               - Diverticulosis in the sigmoid colon.                           - The  examination was otherwise normal on direct                            and retroflexion views.                           - No specimens collected. Moderate Sedation:      Moderate (conscious) sedation was administered by the endoscopy nurse       and supervised by the endoscopist. The following parameters were       monitored: oxygen saturation, heart rate, blood pressure, respiratory       rate, EKG, adequacy of pulmonary ventilation, and response to care.       Total physician intraservice time was 20 minutes. Recommendation:           - Patient has a contact number available for                            emergencies. The signs and symptoms of potential                            delayed complications were discussed  with the                            patient. Return to normal activities tomorrow.                            Written discharge instructions were provided to the                            patient.                           - Resume previous diet.                           - Continue present medications.                           - No repeat colonoscopy due to age.                           - Return to GI clinic (date not yet determined). Procedure Code(s):        --- Professional ---                           [id number], Colonoscopy, flexible; diagnostic, including                            collection of specimen(s) by brushing or washing,                            when performed (separate procedure)                           G0500, Moderate sedation services provided by the  same physician or other qualified health care                            professional performing a gastrointestinal                            endoscopic service that sedation supports,                            requiring the presence of an independent trained                            observer to assist in the monitoring of the                            patient's level of consciousness and physiological                            status; initial 15 minutes of intra-service time;                            patient age 36 years or older (additional time may                            be reported with [phone], as appropriate) Diagnosis Code(s):        --- Professional ---                           Z12.11, Encounter for screening for malignant                            neoplasm of colon                           K57.30, Diverticulosis of large intestine without                            perforation or abscess without bleeding CPT copyright 2019 American Medical Association. All rights reserved. The codes documented in this report are preliminary and upon coder review may  be revised to meet  current compliance requirements. Cristopher Estimable. Alexes Menchaca, MD Norvel Richards, MD 09/28/2020 1:00:01 PM This report has been signed electronically. Number of Addenda: 0

## 2020-10-05 ENCOUNTER — Encounter: Payer: Self-pay | Admitting: Orthopedic Surgery

## 2020-10-05 ENCOUNTER — Ambulatory Visit: Payer: Federal, State, Local not specified - PPO

## 2020-10-05 ENCOUNTER — Ambulatory Visit: Payer: Federal, State, Local not specified - PPO | Admitting: Orthopedic Surgery

## 2020-10-05 ENCOUNTER — Other Ambulatory Visit: Payer: Self-pay

## 2020-10-05 VITALS — BP 148/89 | HR 80 | Ht 63.0 in | Wt 213.0 lb

## 2020-10-05 DIAGNOSIS — M1711 Unilateral primary osteoarthritis, right knee: Secondary | ICD-10-CM | POA: Diagnosis not present

## 2020-10-05 DIAGNOSIS — M1712 Unilateral primary osteoarthritis, left knee: Secondary | ICD-10-CM

## 2020-10-05 DIAGNOSIS — Z96652 Presence of left artificial knee joint: Secondary | ICD-10-CM

## 2020-10-05 DIAGNOSIS — Z96651 Presence of right artificial knee joint: Secondary | ICD-10-CM | POA: Diagnosis not present

## 2020-10-05 NOTE — Progress Notes (Signed)
Chief Complaint  Patient presents with   Post-op Follow-up    Left knee 03/24/19 improving Right knee 02/23/15   Sheri Jones is here for routine follow-up she is had 2 total knee she has had both of them manipulated with only moderate results.  She can barely get to 90 degrees in both knees  However she is able to walk 30 to 45 minutes every day  Examination shows the knees come to full extension but she only has about 85 degrees of flexion  There is no instability in flexion or extension there is no effusion  X-rays show stable total knees the right knee is a Sigma fixed-bearing PS knee in the left knee is a attune fixed-bearing PS knee  Recommend continue exercises walking follow-up in a year for bilateral x-rays   Encounter Diagnoses  Name Primary?   S/P total knee replacement, left Yes   Status post total knee replacement, right    Primary osteoarthritis of left knee    Unilateral primary osteoarthritis, right knee

## 2020-10-06 ENCOUNTER — Ambulatory Visit: Payer: Federal, State, Local not specified - PPO | Admitting: Cardiology

## 2020-10-06 ENCOUNTER — Encounter (HOSPITAL_COMMUNITY): Payer: Self-pay | Admitting: Internal Medicine

## 2020-10-21 ENCOUNTER — Other Ambulatory Visit: Payer: Self-pay

## 2020-10-21 ENCOUNTER — Inpatient Hospital Stay (HOSPITAL_COMMUNITY): Payer: Federal, State, Local not specified - PPO | Attending: Hematology

## 2020-10-21 VITALS — BP 153/74 | HR 83 | Temp 97.0°F | Resp 18

## 2020-10-21 DIAGNOSIS — E538 Deficiency of other specified B group vitamins: Secondary | ICD-10-CM | POA: Insufficient documentation

## 2020-10-21 MED ORDER — CYANOCOBALAMIN 1000 MCG/ML IJ SOLN
INTRAMUSCULAR | Status: AC
  Filled 2020-10-21: qty 1

## 2020-10-21 MED ORDER — CYANOCOBALAMIN 1000 MCG/ML IJ SOLN
1000.0000 ug | Freq: Once | INTRAMUSCULAR | Status: AC
  Administered 2020-10-21: 1000 ug via INTRAMUSCULAR

## 2020-10-21 NOTE — Patient Instructions (Signed)
[facility]  Discharge Instructions: Thank you for choosing [facility] to provide your oncology and hematology care.  If you have a lab appointment with the Cancer Center, please come in thru the Main Entrance and check in at the main information desk.  Wear comfortable clothing and clothing appropriate for easy access to any Portacath or PICC line.   We strive to give you quality time with your provider. You may need to reschedule your appointment if you arrive late (15 or more minutes).  Arriving late affects you and other patients whose appointments are after yours.  Also, if you miss three or more appointments without notifying the office, you may be dismissed from the clinic at the provider's discretion.      For prescription refill requests, have your pharmacy contact our office and allow 72 hours for refills to be completed.    Today you received the following chemotherapy and/or immunotherapy agents B12 injection      To help prevent nausea and vomiting after your treatment, we encourage you to take your nausea medication as directed.  BELOW ARE SYMPTOMS THAT SHOULD BE REPORTED IMMEDIATELY: *FEVER GREATER THAN 100.4 F (38 C) OR HIGHER *CHILLS OR SWEATING *NAUSEA AND VOMITING THAT IS NOT CONTROLLED WITH YOUR NAUSEA MEDICATION *UNUSUAL SHORTNESS OF BREATH *UNUSUAL BRUISING OR BLEEDING *URINARY PROBLEMS (pain or burning when urinating, or frequent urination) *BOWEL PROBLEMS (unusual diarrhea, constipation, pain near the anus) TENDERNESS IN MOUTH AND THROAT WITH OR WITHOUT PRESENCE OF ULCERS (sore throat, sores in mouth, or a toothache) UNUSUAL RASH, SWELLING OR PAIN  UNUSUAL VAGINAL DISCHARGE OR ITCHING   Items with * indicate a potential emergency and should be followed up as soon as possible or go to the Emergency Department if any problems should occur.  Please show the CHEMOTHERAPY ALERT CARD or IMMUNOTHERAPY ALERT CARD at check-in to the Emergency  Department and triage nurse.  Should you have questions after your visit or need to cancel or reschedule your appointment, please contact [facility] [phone]  and follow the prompts.  Office hours are 8:00 a.m. to 4:30 p.m. Monday - Friday. Please note that voicemails left after 4:00 p.m. may not be returned until the following business day.  We are closed weekends and major holidays. You have access to a nurse at all times for urgent questions. Please call the main number to the clinic [phone] and follow the prompts.  For any non-urgent questions, you may also contact your provider using MyChart. We now offer e-Visits for anyone 18 and older to request care online for non-urgent symptoms. For details visit [email or web address].   Also download the MyChart app! Go to the app store, search "MyChart", open the app, select [facility], and log in with your MyChart username and password.  Due to Covid, a mask is required upon entering the hospital/clinic. If you do not have a mask, one will be given to you upon arrival. For doctor visits, patients may have 1 support person aged 18 or older with them. For treatment visits, patients cannot have anyone with them due to current Covid guidelines and our immunocompromised population.  

## 2020-10-21 NOTE — Progress Notes (Signed)
Sheri Jones presents today for B12 njection per the provider's orders.  Stable during administration without incident; injection site WNL; see MAR for injection details.  Patient tolerated procedure well and without incident.  No questions or complaints noted at this time.  Discharge from clinic ambulatory in stable condition.  Alert and oriented X 3.  Follow up with [facility] as scheduled.

## 2020-11-01 ENCOUNTER — Encounter: Payer: Self-pay | Admitting: Family Medicine

## 2020-11-01 ENCOUNTER — Ambulatory Visit: Payer: Federal, State, Local not specified - PPO | Admitting: Family Medicine

## 2020-11-01 ENCOUNTER — Other Ambulatory Visit: Payer: Self-pay

## 2020-11-01 VITALS — BP 130/80 | HR 73 | Temp 97.9°F | Resp 18 | Ht 63.0 in | Wt 213.0 lb

## 2020-11-01 DIAGNOSIS — I10 Essential (primary) hypertension: Secondary | ICD-10-CM

## 2020-11-01 DIAGNOSIS — M79672 Pain in left foot: Secondary | ICD-10-CM | POA: Diagnosis not present

## 2020-11-01 MED ORDER — INDOMETHACIN 25 MG PO CAPS: ORAL_CAPSULE | ORAL | 0 refills | Status: DC

## 2020-11-01 NOTE — Patient Instructions (Addendum)
Annual exam end October, call if you need me sooner  Indomethacin one daily for 5 days for left foot pain  Take tylenol arthrits one twice daily for the next 5 days  Please resume walking slowly and for shorter time on the weekend  Congrats on weight loss

## 2020-11-02 ENCOUNTER — Encounter: Payer: Self-pay | Admitting: Family Medicine

## 2020-11-02 DIAGNOSIS — M79672 Pain in left foot: Secondary | ICD-10-CM | POA: Insufficient documentation

## 2020-11-02 NOTE — Progress Notes (Signed)
Acute Office Visit  Subjective:    Patient ID: Sheri Jones, female    DOB: 01-17-1946, 75 y.o.   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   Hypertension    Phreesia 02/01/2020   Obesity, unspecified    Osteoarthrosis, unspecified whether generalized or localized, lower leg    Unspecified essential hypertension    Vitamin B12 deficiency disease 10/28/2008   Qualifier: Diagnosis of  By: Moshe Cipro MD, Joycelyn Schmid      Past Surgical History:  Procedure Laterality Date   COLONOSCOPY  2008   COLONOSCOPY N/A 09/28/2020   Procedure: COLONOSCOPY;  Surgeon: Daneil Dolin, MD;  [facility];  Service: Endoscopy;  Laterality: N/A;  ASA II / 1:00   EXAM UNDER ANESTHESIA WITH MANIPULATION OF KNEE Right 05/11/2015   Procedure: EXAM UNDER ANESTHESIA WITH MANIPULATION OF KNEE;  Surgeon: Carole Civil, MD;  Location: [facility];  Service: Orthopedics;  Laterality: Right;   EYE SURGERY Left 04/27/2018   cataract   EYE SURGERY Right 06/17/2018   cataract   GASTRIC BYPASS  2004   JOINT REPLACEMENT N/A    Phreesia 02/01/2020   TOTAL KNEE ARTHROPLASTY Right 02/23/2015   Procedure: RIGHT TOTAL KNEE ARTHROPLASTY;  Surgeon: Carole Civil, MD;  Location: [facility];  Service: Orthopedics;  Laterality: Right;   TOTAL KNEE ARTHROPLASTY Left 03/24/2019   Procedure: TOTAL KNEE ARTHROPLASTY;  Surgeon: Carole Civil, MD;  Location: [facility];  Service: Orthopedics;  Laterality: Left;    Family History  Problem  Relation Age of Onset   Hypertension Mother    Hypertension Sister    Hypertension Sister    Stroke Sister 71   Hypertension Sister    Heart disease Father 40       massive heart attack    Breast cancer Maternal Aunt    Lung cancer Maternal Aunt     Social History   Socioeconomic History   Marital status: Single    Spouse name: Not on file   Number of children: Not on file   Years of education: Not on file   Highest education level: Not on file  Occupational History   Not on file  Tobacco Use   Smoking status: Never   Smokeless tobacco: Never  Vaping Use   Vaping Use: Never used  Substance and Sexual Activity   Alcohol use: No   Drug use: No   Sexual activity: Not Currently  Other Topics Concern   Not on file  Social History Narrative   Not on file   Social Determinants of Health   Financial Resource Strain: Low Risk    Difficulty of Paying Living Expenses: Not hard at all  Food Insecurity: No Food Insecurity   Worried About Charity fundraiser in the Last Year: Never true   [place] in the Last Year: Never  true  Transportation Needs: No Transportation Needs   Lack of Transportation (Medical): No   Lack of Transportation (Non-Medical): No  Physical Activity: Insufficiently Active   Days of Exercise per Week: 7 days   Minutes of Exercise per Session: 20 min  Stress: No Stress Concern Present   Feeling of Stress : Not at all  Social Connections: Moderately Isolated   Frequency of Communication with Friends and Family: More than three times a week   Frequency of Social Gatherings with Friends and Family: More than three times a week   Attends Religious Services: More than 4 times per year   Active Member of [employer] or Organizations: No   Attends Archivist Meetings: Never   Marital Status: Never married  Human resources officer Violence: Not At Risk   Fear of Current or Ex-Partner: No   Emotionally Abused: No   Physically Abused: No   Sexually  Abused: No    Outpatient Medications Prior to Visit  Medication Sig Dispense Refill   acetaminophen (TYLENOL) 325 MG tablet Take 650 mg by mouth every 6 (six) hours as needed for mild pain.     Calcium Carb-Cholecalciferol (CALCIUM 600+D) 600-800 MG-UNIT TABS Take 2 tablets by mouth daily.     cetirizine (ZYRTEC) 10 MG tablet Take 10 mg by mouth daily as needed for allergies.      cholecalciferol (VITAMIN D3) 25 MCG (1000 UT) tablet Take 1,000 Units by mouth daily.     cyanocobalamin (,VITAMIN B-12,) 1000 MCG/ML injection Inject 1,000 mcg into the muscle every 30 (thirty) days.     diphenhydrAMINE (BENADRYL) 25 MG tablet Take 25 mg by mouth every 8 (eight) hours as needed for allergies.     hydrochlorothiazide (HYDRODIURIL) 25 MG tablet TAKE 1 TABLET BY MOUTH EVERY DAY (Patient taking differently: Take 25 mg by mouth daily.) 90 tablet 1   Multiple Vitamins-Minerals (CENTRUM ULTRA WOMENS PO) Take 1 tablet by mouth daily.     pantoprazole (PROTONIX) 20 MG tablet Take 20 mg by mouth daily as needed for heartburn or indigestion.     potassium chloride (KLOR-CON) 10 MEQ tablet TAKE 1 TABLET (10 MEQ TOTAL) BY MOUTH 2 (TWO) TIMES DAILY. 180 tablet 1   No facility-administered medications prior to visit.    No Known Allergies  Review of Systems Negative Respiratory, CVS, GU     Objective:    Physical Exam Alert and oriented x 3 in no CP distress. Chest : CTA CVS: RRR Abd: soft non tender MS: Decreased ROM spine, tender to deep palpation over meta tarsophalangeal joints left foot, no erythema or warmth, full ROM of toes  BP 130/80   Pulse 73   Temp 97.9 F (36.6 C)   Resp 18   Ht 5\' 3"  (1.6 m)   Wt 213 lb (96.6 kg)   SpO2 94%   BMI 37.73 kg/m  Wt Readings from Last 3 Encounters:  11/01/20 213 lb (96.6 kg)  10/05/20 213 lb (96.6 kg)  09/28/20 215 lb (97.5 kg)    There are no preventive care reminders to display for this patient.  There are no preventive care reminders to  display for this patient.   Lab Results  Component Value Date   TSH 1.39 01/28/2020   Lab Results  Component Value Date   WBC 4.7 06/24/2020   HGB 13.5 06/24/2020   HCT 40.4 06/24/2020   MCV 94.0 06/24/2020   PLT 174 06/24/2020   Lab Results  Component Value Date  NA 142 07/28/2020   K 3.9 07/28/2020   CO2 27 07/28/2020   GLUCOSE 87 07/28/2020   BUN 22 07/28/2020   CREATININE 0.81 07/28/2020   BILITOT 0.8 06/24/2020   ALKPHOS 78 06/24/2020   AST 28 06/24/2020   ALT 25 06/24/2020   PROT 7.0 06/24/2020   ALBUMIN 3.9 06/24/2020   CALCIUM 9.1 07/28/2020   ANIONGAP 9 06/24/2020   Lab Results  Component Value Date   CHOL 196 01/28/2020   Lab Results  Component Value Date   HDL 75 01/28/2020   Lab Results  Component Value Date   LDLCALC 105 (H) 01/28/2020   Lab Results  Component Value Date   TRIG 72 01/28/2020   Lab Results  Component Value Date   CHOLHDL 2.6 01/28/2020   Lab Results  Component Value Date   HGBA1C 5.3 02/12/2019       Assessment & Plan:  Acute foot pain, left 5 day course of once daily indomethacin, and tylenol ES one twice daily  Essential hypertension Controlled, no change in medication   Morbid obesity ([place])  Patient re-educated about  the importance of commitment to a  minimum of 150 minutes of exercise per week as able.  The importance of healthy food choices with portion control discussed, as well as eating regularly and within a 12 hour window most days. The need to choose "clean , green" food 50 to 75% of the time is discussed, as well as to make water the primary drink and set a goal of 64 ounces water daily.    Weight /BMI 11/01/2020 10/05/2020 09/28/2020  WEIGHT 213 lb 213 lb 215 lb  HEIGHT 5\' 3"  5\' 3"  5\' 3"   BMI 37.73 kg/m2 37.73 kg/m2 38.09 kg/m2     Problem List Items Addressed This Visit   None    Meds ordered this encounter  Medications   indomethacin (INDOCIN) 25 MG capsule    Sig: Take one capsule by  mouth once daily for 5 days  only    Dispense:  5 capsule    Refill:  0     Tula Nakayama, MD

## 2020-11-02 NOTE — Assessment & Plan Note (Signed)
Controlled, no change in medication  

## 2020-11-02 NOTE — Assessment & Plan Note (Signed)
  Patient re-educated about  the importance of commitment to a  minimum of 150 minutes of exercise per week as able.  The importance of healthy food choices with portion control discussed, as well as eating regularly and within a 12 hour window most days. The need to choose "clean , green" food 50 to 75% of the time is discussed, as well as to make water the primary drink and set a goal of 64 ounces water daily.    Weight /BMI 11/01/2020 10/05/2020 09/28/2020  WEIGHT 213 lb 213 lb 215 lb  HEIGHT 5\' 3"  5\' 3"  5\' 3"   BMI 37.73 kg/m2 37.73 kg/m2 38.09 kg/m2

## 2020-11-02 NOTE — Assessment & Plan Note (Signed)
5 day course of once daily indomethacin, and tylenol ES one twice daily

## 2020-11-14 ENCOUNTER — Encounter: Payer: Self-pay | Admitting: Internal Medicine

## 2020-11-14 ENCOUNTER — Other Ambulatory Visit: Payer: Self-pay

## 2020-11-14 ENCOUNTER — Ambulatory Visit: Payer: Federal, State, Local not specified - PPO | Admitting: Internal Medicine

## 2020-11-14 VITALS — BP 142/80 | HR 73 | Ht 63.0 in | Wt 215.0 lb

## 2020-11-14 DIAGNOSIS — R0600 Dyspnea, unspecified: Secondary | ICD-10-CM

## 2020-11-14 DIAGNOSIS — R0609 Other forms of dyspnea: Secondary | ICD-10-CM

## 2020-11-14 NOTE — Patient Instructions (Signed)
Medication Instructions:  Your physician recommends that you continue on your current medications as directed. Please refer to the Current Medication list given to you today.  *If you need a refill on your cardiac medications before your next appointment, please call your pharmacy*   Lab Work: None If you have labs (blood work) drawn today and your tests are completely normal, you will receive your results only by: MyChart Message (if you have MyChart) OR A paper copy in the mail If you have any lab test that is abnormal or we need to change your treatment, we will call you to review the results.   Testing/Procedures: None   Follow-Up: At [facility], you and your health needs are our priority.  As part of our continuing mission to provide you with exceptional heart care, we have created designated Provider Care Teams.  These Care Teams include your primary Cardiologist (physician) and Advanced Practice Providers (APPs -  Physician Assistants and Nurse Practitioners) who all work together to provide you with the care you need, when you need it.  We recommend signing up for the patient portal called "MyChart".  Sign up information is provided on this After Visit Summary.  MyChart is used to connect with patients for Virtual Visits (Telemedicine).  Patients are able to view lab/test results, encounter notes, upcoming appointments, etc.  Non-urgent messages can be sent to your provider as well.   To learn more about what you can do with MyChart, go to [email or web address].    Your next appointment:   Follow Up: As Needed  Other Instructions    

## 2020-11-14 NOTE — Progress Notes (Signed)
Cardiology Office Note   Date:  11/14/2020   ID:  Sheri Jones, DOB 10/21/1945, MRN [id number]  PCP:  Fayrene Helper, MD  Cardiologist:   Dorris Carnes, MD   Pt referred for DOE       History of Present Illness: Sheri Jones is a 75 y.o. female with a history of HTN, HL who is referred for evaluation of DOE     The pt is followed by Bari Mantis   Seen in May 2022  At thtat time she complained of fatigue, DOE  Started during the winter  [facility] was not very active   When walked had to stop and catch her breath     Since then the pt says that she has started walking more and now she says her breathing is good  Her endurance is better        Current Meds  Medication Sig   acetaminophen (TYLENOL) 325 MG tablet Take 650 mg by mouth every 6 (six) hours as needed for mild pain.   Calcium Carb-Cholecalciferol (CALCIUM 600+D) 600-800 MG-UNIT TABS Take 2 tablets by mouth daily.   cetirizine (ZYRTEC) 10 MG tablet Take 10 mg by mouth daily as needed for allergies.    cholecalciferol (VITAMIN D3) 25 MCG (1000 UT) tablet Take 1,000 Units by mouth daily.   cyanocobalamin (,VITAMIN B-12,) 1000 MCG/ML injection Inject 1,000 mcg into the muscle every 30 (thirty) days.   diphenhydrAMINE (BENADRYL) 25 MG tablet Take 25 mg by mouth every 8 (eight) hours as needed for allergies.   hydrochlorothiazide (HYDRODIURIL) 25 MG tablet TAKE 1 TABLET BY MOUTH EVERY DAY (Patient taking differently: Take 25 mg by mouth daily.)   Multiple Vitamins-Minerals (CENTRUM ULTRA WOMENS PO) Take 1 tablet by mouth daily.   pantoprazole (PROTONIX) 20 MG tablet Take 20 mg by mouth daily as needed for heartburn or indigestion.   potassium chloride (KLOR-CON) 10 MEQ tablet TAKE 1 TABLET (10 MEQ TOTAL) BY MOUTH 2 (TWO) TIMES DAILY.     Allergies:   Patient has no known allergies.   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   Hypertension    Phreesia 02/01/2020   Obesity, unspecified    Osteoarthrosis, unspecified whether generalized or localized, lower leg    Unspecified essential hypertension    Vitamin B12 deficiency disease 10/28/2008   Qualifier: Diagnosis of  By: Moshe Cipro MD, Joycelyn Schmid      Past Surgical History:  Procedure Laterality Date   COLONOSCOPY  2008   COLONOSCOPY N/A 09/28/2020   Procedure: COLONOSCOPY;  Surgeon: Daneil Dolin, MD;  [facility];  Service: Endoscopy;  Laterality: N/A;  ASA II / 1:00   EXAM UNDER ANESTHESIA WITH MANIPULATION OF KNEE Right 05/11/2015   Procedure: EXAM UNDER ANESTHESIA WITH MANIPULATION OF KNEE;  Surgeon: Carole Civil, MD;  Location: [facility];  Service: Orthopedics;  Laterality: Right;   EYE SURGERY Left 04/27/2018   cataract   EYE SURGERY Right 06/17/2018   cataract   GASTRIC BYPASS  2004   JOINT REPLACEMENT N/A    Phreesia 02/01/2020   TOTAL KNEE ARTHROPLASTY Right 02/23/2015   Procedure: RIGHT TOTAL KNEE ARTHROPLASTY;  Surgeon: Carole Civil, MD;  Location: [facility];  Service:  Orthopedics;  Laterality: Right;   TOTAL KNEE ARTHROPLASTY Left 03/24/2019   Procedure: TOTAL KNEE ARTHROPLASTY;  Surgeon: Carole Civil, MD;  Location: [facility];  Service: Orthopedics;  Laterality: Left;     Social History:  The patient  reports that she has never smoked. She has never used smokeless tobacco. She reports that she does not drink alcohol and does not use drugs.   Family History:  The patient's family history includes Breast cancer in her maternal aunt; Heart disease (age of onset: 79) in her father; Hypertension in her mother, sister, sister, and sister; Lung cancer in her maternal aunt; Stroke (age of onset: 1) in her sister.    ROS:  Please see the history of present illness. All other systems are reviewed and  Negative to the  above problem except as noted.    PHYSICAL EXAM: VS:  BP (!) 142/80   Pulse 73   Ht '5\' 3"'$  (1.6 m)   Wt 215 lb (97.5 kg)   SpO2 97%   BMI 38.09 kg/m   GEN: Morbidly obese 75 yo  in no acute distress  HEENT: normal  Neck: no JVD, no carotid bruits Cardiac: RRR; no murmurs  No LE  edema  Respiratory:  clear to auscultation bilaterally,   GI: soft, nontender, nondistended, + BS  No hepatomegaly  MS: no deformity Moving all extremities   Skin: warm and dry, no rash Neuro:  Strength and sensation are intact Psych: euthymic mood, full affect   EKG:  EKG is ordered today.  NSR 73 bpm     Lipid Panel    Component Value Date/Time   CHOL 196 01/28/2020 1002   TRIG 72 01/28/2020 1002   HDL 75 01/28/2020 1002   CHOLHDL 2.6 01/28/2020 1002   VLDL 13 06/28/2015 1423   LDLCALC 105 (H) 01/28/2020 1002      Wt Readings from Last 3 Encounters:  11/14/20 215 lb (97.5 kg)  11/01/20 213 lb (96.6 kg)  10/05/20 213 lb (96.6 kg)      ASSESSMENT AND PLAN:  1  Dyspnea   Improved  with conditioning     Given that it is better I do not think it is an anginal equivalent  2  HTN   BP is fair   She says it is usually better    3   Morbid obesity   Discussed diet   Limiting carbs   TRE    Minimizing sweets     Current medicines are reviewed at length with the patient today.  The patient does not have concerns regarding medicines.  Signed, Dorris Carnes, MD  11/14/2020 1:17 PM    [facility] [address], [place], [place]  [address] Phone: [phone]; Fax: [phone]

## 2020-11-18 ENCOUNTER — Encounter (HOSPITAL_COMMUNITY): Payer: Self-pay

## 2020-11-18 ENCOUNTER — Inpatient Hospital Stay (HOSPITAL_COMMUNITY): Payer: Federal, State, Local not specified - PPO

## 2020-11-18 ENCOUNTER — Other Ambulatory Visit: Payer: Self-pay

## 2020-11-18 VITALS — BP 140/68 | HR 75 | Temp 97.1°F | Resp 18

## 2020-11-18 DIAGNOSIS — E538 Deficiency of other specified B group vitamins: Secondary | ICD-10-CM

## 2020-11-18 MED ORDER — CYANOCOBALAMIN 1000 MCG/ML IJ SOLN
1000.0000 ug | Freq: Once | INTRAMUSCULAR | Status: AC
  Administered 2020-11-18: 1000 ug via INTRAMUSCULAR
  Filled 2020-11-18: qty 1

## 2020-11-18 NOTE — Addendum Note (Signed)
Addended by: Levonne Hubert on: 11/18/2020 03:28 PM   Modules accepted: Orders

## 2020-11-18 NOTE — Patient Instructions (Signed)
[facility]  Discharge Instructions: Thank you for choosing [facility] to provide your oncology and hematology care.  If you have a lab appointment with the [facility], please come in thru the Main Entrance and check in at the main information desk.  Wear comfortable clothing and clothing appropriate for easy access to any Portacath or PICC line.   We strive to give you quality time with your provider. You may need to reschedule your appointment if you arrive late (15 or more minutes).  Arriving late affects you and other patients whose appointments are after yours.  Also, if you miss three or more appointments without notifying the office, you may be dismissed from the clinic at the provider's discretion.      For prescription refill requests, have your pharmacy contact our office and allow 72 hours for refills to be completed.    Today you received the following: B12 injection, return as scheduled.   To help prevent nausea and vomiting after your treatment, we encourage you to take your nausea medication as directed.  BELOW ARE SYMPTOMS THAT SHOULD BE REPORTED IMMEDIATELY: *FEVER GREATER THAN 100.4 F (38 C) OR HIGHER *CHILLS OR SWEATING *NAUSEA AND VOMITING THAT IS NOT CONTROLLED WITH YOUR NAUSEA MEDICATION *UNUSUAL SHORTNESS OF BREATH *UNUSUAL BRUISING OR BLEEDING *URINARY PROBLEMS (pain or burning when urinating, or frequent urination) *BOWEL PROBLEMS (unusual diarrhea, constipation, pain near the anus) TENDERNESS IN MOUTH AND THROAT WITH OR WITHOUT PRESENCE OF ULCERS (sore throat, sores in mouth, or a toothache) UNUSUAL RASH, SWELLING OR PAIN  UNUSUAL VAGINAL DISCHARGE OR ITCHING   Items with * indicate a potential emergency and should be followed up as soon as possible or go to the Emergency Department if any problems should occur.  Please show the CHEMOTHERAPY ALERT CARD or IMMUNOTHERAPY ALERT CARD at check-in to the Emergency Department and triage  nurse.  Should you have questions after your visit or need to cancel or reschedule your appointment, please contact [facility] [phone]  and follow the prompts.  Office hours are 8:00 a.m. to 4:30 p.m. Monday - Friday. Please note that voicemails left after 4:00 p.m. may not be returned until the following business day.  We are closed weekends and major holidays. You have access to a nurse at all times for urgent questions. Please call the main number to the clinic [phone] and follow the prompts.  For any non-urgent questions, you may also contact your provider using MyChart. We now offer e-Visits for anyone 67 and older to request care online for non-urgent symptoms. For details visit [email or web address].   Also download the MyChart app! Go to the app store, search "MyChart", open the app, select [facility], and log in with your MyChart username and password.  Due to Covid, a mask is required upon entering the hospital/clinic. If you do not have a mask, one will be given to you upon arrival. For doctor visits, patients may have 1 support person aged 41 or older with them. For treatment visits, patients cannot have anyone with them due to current Covid guidelines and our immunocompromised population.

## 2020-11-18 NOTE — Progress Notes (Signed)
Patient tolerated injection with no complaints voiced. Site clean and dry with no bruising or swelling noted at site. See MAR for details. Band aid applied.  Patient stable during and after injection. VSS with discharge and left in satisfactory condition with no s/s of distress noted.  

## 2020-12-21 ENCOUNTER — Other Ambulatory Visit: Payer: Self-pay | Admitting: Family Medicine

## 2020-12-23 ENCOUNTER — Inpatient Hospital Stay (HOSPITAL_COMMUNITY): Payer: Federal, State, Local not specified - PPO | Attending: Hematology

## 2020-12-23 ENCOUNTER — Other Ambulatory Visit: Payer: Self-pay

## 2020-12-23 VITALS — BP 128/70 | HR 75 | Temp 97.2°F | Resp 18

## 2020-12-23 DIAGNOSIS — E538 Deficiency of other specified B group vitamins: Secondary | ICD-10-CM | POA: Insufficient documentation

## 2020-12-23 MED ORDER — CYANOCOBALAMIN 1000 MCG/ML IJ SOLN
1000.0000 ug | Freq: Once | INTRAMUSCULAR | Status: AC
  Administered 2020-12-23: 1000 ug via INTRAMUSCULAR
  Filled 2020-12-23: qty 1

## 2020-12-23 NOTE — Progress Notes (Signed)
Sheri Jones presents today for B12 injection per the provider's orders.  Stable during administration without incident; injection site WNL; see MAR for injection details.  Patient tolerated procedure well and without incident.  No questions or complaints noted at this time. Discharge from clinic ambulatory in stable condition.  Alert and oriented X 3.  Follow up with [facility] as scheduled.

## 2020-12-23 NOTE — Patient Instructions (Signed)
[facility]  Discharge Instructions: Thank you for choosing [facility] to provide your oncology and hematology care.  If you have a lab appointment with the Cancer Center, please come in thru the Main Entrance and check in at the main information desk.  Wear comfortable clothing and clothing appropriate for easy access to any Portacath or PICC line.   We strive to give you quality time with your provider. You may need to reschedule your appointment if you arrive late (15 or more minutes).  Arriving late affects you and other patients whose appointments are after yours.  Also, if you miss three or more appointments without notifying the office, you may be dismissed from the clinic at the provider's discretion.      For prescription refill requests, have your pharmacy contact our office and allow 72 hours for refills to be completed.    Today you received the following chemotherapy and/or immunotherapy agents B12      To help prevent nausea and vomiting after your treatment, we encourage you to take your nausea medication as directed.  BELOW ARE SYMPTOMS THAT SHOULD BE REPORTED IMMEDIATELY: *FEVER GREATER THAN 100.4 F (38 C) OR HIGHER *CHILLS OR SWEATING *NAUSEA AND VOMITING THAT IS NOT CONTROLLED WITH YOUR NAUSEA MEDICATION *UNUSUAL SHORTNESS OF BREATH *UNUSUAL BRUISING OR BLEEDING *URINARY PROBLEMS (pain or burning when urinating, or frequent urination) *BOWEL PROBLEMS (unusual diarrhea, constipation, pain near the anus) TENDERNESS IN MOUTH AND THROAT WITH OR WITHOUT PRESENCE OF ULCERS (sore throat, sores in mouth, or a toothache) UNUSUAL RASH, SWELLING OR PAIN  UNUSUAL VAGINAL DISCHARGE OR ITCHING   Items with * indicate a potential emergency and should be followed up as soon as possible or go to the Emergency Department if any problems should occur.  Please show the CHEMOTHERAPY ALERT CARD or IMMUNOTHERAPY ALERT CARD at check-in to the Emergency Department  and triage nurse.  Should you have questions after your visit or need to cancel or reschedule your appointment, please contact [facility] [phone]  and follow the prompts.  Office hours are 8:00 a.m. to 4:30 p.m. Monday - Friday. Please note that voicemails left after 4:00 p.m. may not be returned until the following business day.  We are closed weekends and major holidays. You have access to a nurse at all times for urgent questions. Please call the main number to the clinic [phone] and follow the prompts.  For any non-urgent questions, you may also contact your provider using MyChart. We now offer e-Visits for anyone 18 and older to request care online for non-urgent symptoms. For details visit [email or web address].   Also download the MyChart app! Go to the app store, search "MyChart", open the app, select [facility], and log in with your MyChart username and password.  Due to Covid, a mask is required upon entering the hospital/clinic. If you do not have a mask, one will be given to you upon arrival. For doctor visits, patients may have 1 support person aged 18 or older with them. For treatment visits, patients cannot have anyone with them due to current Covid guidelines and our immunocompromised population.  

## 2020-12-28 ENCOUNTER — Other Ambulatory Visit: Payer: Self-pay | Admitting: Family Medicine

## 2021-01-08 ENCOUNTER — Ambulatory Visit
Admission: EM | Admit: 2021-01-08 | Discharge: 2021-01-08 | Disposition: A | Discharge status: Home / Self Care | Payer: Federal, State, Local not specified - PPO | Attending: Family Medicine | Admitting: Family Medicine

## 2021-01-08 ENCOUNTER — Encounter: Payer: Self-pay | Admitting: Emergency Medicine

## 2021-01-08 ENCOUNTER — Other Ambulatory Visit: Payer: Self-pay

## 2021-01-08 DIAGNOSIS — L309 Dermatitis, unspecified: Secondary | ICD-10-CM | POA: Diagnosis not present

## 2021-01-08 MED ORDER — TRIAMCINOLONE ACETONIDE 0.1 % EX CREA: 1.0000 "application " | TOPICAL_CREAM | Freq: Two times a day (BID) | CUTANEOUS | 0 refills | Status: DC

## 2021-01-08 NOTE — ED Triage Notes (Signed)
Thinks she was bite by a bug on Tuesday on back of left leg.  States area is discolored.  She also states has a rash all of her body since yesterday that is itchy.

## 2021-01-12 NOTE — ED Provider Notes (Signed)
[facility]    CSN: [id number] Arrival date & time: 01/08/21  1145      History   Chief Complaint No chief complaint on file.   HPI Sheri Jones is a 75 y.o. female.   HPI  Patient in today with acute itching involving bilateral arms and lower left leg rash. Unknown source of irritant. Tried cortisone cream without relief. 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   Hypertension    Phreesia 02/01/2020   Obesity, unspecified    Osteoarthrosis, unspecified whether generalized or localized, lower leg    Unspecified essential hypertension    Vitamin B12 deficiency disease 10/28/2008   Qualifier: Diagnosis of  By: Moshe Cipro MD, Margaret      Patient Active Problem List   Diagnosis Date Noted   Acute foot pain, left 11/02/2020   Dyspnea on exertion 09/15/2020   Chronic seasonal allergic rhinitis 11/06/2019   S/P total knee replacement, 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 04/07/2019   S/P total knee arthroplasty, 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   Primary osteoarthritis of left knee    Arthrofibrosis of total knee arthroplasty ([place]) 05/11/2015   Osteopenia 08/12/2013   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   Vitamin D deficiency 08/07/2011   Chronic pain of both knees 01/30/2011   PERSONAL HX COLONIC POLYPS 07/05/2010   KNEE, ARTHRITIS, DEGEN./OSTEO 01/16/2010   Vitamin B12 deficiency disease 10/28/2008   Morbid obesity ([place]) 08/15/2007   Essential hypertension 08/15/2007    Past Surgical History:  Procedure Laterality Date   COLONOSCOPY  2008   COLONOSCOPY N/A 09/28/2020   Procedure: COLONOSCOPY;  Surgeon: Daneil Dolin, MD;  Location: [facility];  Service: Endoscopy;  Laterality: N/A;  ASA II  / 1:00   EXAM UNDER ANESTHESIA WITH MANIPULATION OF KNEE Right 05/11/2015   Procedure: EXAM UNDER ANESTHESIA WITH MANIPULATION OF KNEE;  Surgeon: Carole Civil, MD;  Location: [facility];  Service: Orthopedics;  Laterality: Right;   EYE SURGERY Left 04/27/2018   cataract   EYE SURGERY Right 06/17/2018   cataract   GASTRIC BYPASS  2004   JOINT REPLACEMENT N/A    Phreesia 02/01/2020   TOTAL KNEE ARTHROPLASTY Right 02/23/2015   Procedure: RIGHT TOTAL KNEE ARTHROPLASTY;  Surgeon: Carole Civil, MD;  Location: [facility];  Service: Orthopedics;  Laterality: Right;   TOTAL KNEE ARTHROPLASTY Left 03/24/2019   Procedure: TOTAL KNEE ARTHROPLASTY;  Surgeon: Carole Civil, MD;  Location: [facility];  Service: Orthopedics;  Laterality: Left;    OB History   No obstetric history on file.      Home Medications    Prior to Admission medications   Medication Sig Start Date End Date Taking? Authorizing Provider  triamcinolone cream (KENALOG) 0.1 % Apply 1 application topically 2 (two) times daily. 01/08/21  Yes Scot Jun, FNP  acetaminophen (TYLENOL) 325 MG tablet Take 650 mg by mouth every 6 (six) hours as needed for mild pain.    [provider]  Calcium Carb-Cholecalciferol (CALCIUM 600+D) 600-800 MG-UNIT TABS Take 2 tablets by mouth daily.    [provider]  cetirizine (ZYRTEC) 10 MG tablet Take 10 mg by mouth daily as needed  for allergies.     [provider]  cholecalciferol (VITAMIN D3) 25 MCG (1000 UT) tablet Take 1,000 Units by mouth daily.    [provider]  cyanocobalamin (,VITAMIN B-12,) 1000 MCG/ML injection Inject 1,000 mcg into the muscle every 30 (thirty) days.    [provider]  diphenhydrAMINE (BENADRYL) 25 MG tablet Take 25 mg by mouth every 8 (eight) hours as needed for allergies.    [provider]  hydrochlorothiazide (HYDRODIURIL) 25 MG tablet TAKE 1 TABLET BY MOUTH EVERY DAY 12/28/20   Fayrene Helper, MD   indomethacin (INDOCIN) 25 MG capsule Take one capsule by mouth once daily for 5 days  only Patient taking differently: Take one capsule by mouth once daily for 5 days  only 11/01/20   Fayrene Helper, MD  Multiple Vitamins-Minerals (CENTRUM ULTRA WOMENS PO) Take 1 tablet by mouth daily.    [provider]  pantoprazole (PROTONIX) 20 MG tablet Take 20 mg by mouth daily as needed for heartburn or indigestion.    [provider]  potassium chloride (KLOR-CON) 10 MEQ tablet TAKE 1 TABLET BY MOUTH 2 TIMES DAILY. 12/21/20   Fayrene Helper, MD  temazepam (RESTORIL) 7.5 MG capsule Take 1 capsule (7.5 mg total) by mouth at bedtime as needed for sleep. Patient not taking: Reported on 05/19/2019 04/29/19 09/22/19  Fayrene Helper, MD    Family History Family History  Problem Relation Age of Onset   Hypertension Mother    Hypertension Sister    Hypertension Sister    Stroke Sister 72   Hypertension Sister    Heart disease Father 96       massive heart attack    Breast cancer Maternal Aunt    Lung cancer Maternal Aunt     Social History Social History   Tobacco Use   Smoking status: Never   Smokeless tobacco: Never  Vaping Use   Vaping Use: Never used  Substance Use Topics   Alcohol use: No   Drug use: No     Allergies   Patient has no known allergies.   Review of Systems Review of Systems Pertinent negatives listed in HPI   Physical Exam Triage Vital Signs ED Triage Vitals [01/08/21 1305]  Enc Vitals Group     BP (!) 141/75     Pulse Rate 73     Resp 18     Temp 97.9 F (36.6 C)     Temp Source Temporal     SpO2 97 %     Weight      Height      Head Circumference      Peak Flow      Pain Score 0     Pain Loc      Pain Edu?      Excl. in [place]?    No data found.  Updated Vital Signs BP (!) 141/75 (BP Location: Right Arm)   Pulse 73   Temp 97.9 F (36.6 C) (Temporal)   Resp 18   SpO2 97%   Visual Acuity Right Eye Distance:   Left  Eye Distance:   Bilateral Distance:    Right Eye Near:   Left Eye Near:    Bilateral Near:     Physical Exam Constitutional:      Appearance: Normal appearance.  Cardiovascular:     Rate and Rhythm: Normal rate and regular rhythm.  Pulmonary:     Effort: Pulmonary effort is normal.  Breath sounds: Normal breath sounds.  Skin:    General: Skin is dry.     Comments: No visible rash   Neurological:     Mental Status: She is alert.     UC Treatments / Results  Labs (all labs ordered are listed, but only abnormal results are displayed) Labs Reviewed - No data to display  EKG   Radiology No results found.  Procedures Procedures (including critical care time)  Medications Ordered in UC Medications - No data to display  Initial Impression / Assessment and Plan / UC Course  I have reviewed the triage vital signs and the nursing notes.  Pertinent labs & imaging results that were available during my care of the patient were reviewed by me and considered in my medical decision making (see chart for details).    Dermatitis , unknown etiology Treatment per discharge medication orders  RTC PRN Final Clinical Impressions(s) / UC Diagnoses   Final diagnoses:  Dermatitis   Discharge Instructions   None    ED Prescriptions     Medication Sig Dispense Auth. Provider   triamcinolone cream (KENALOG) 0.1 % Apply 1 application topically 2 (two) times daily. 454 g Scot Jun, FNP      PDMP not reviewed this encounter.   Scot Jun, FNP 01/12/21 [phone]

## 2021-01-13 ENCOUNTER — Other Ambulatory Visit: Payer: Self-pay

## 2021-01-13 ENCOUNTER — Encounter: Payer: Self-pay | Admitting: Family Medicine

## 2021-01-13 ENCOUNTER — Ambulatory Visit: Payer: Federal, State, Local not specified - PPO | Admitting: Family Medicine

## 2021-01-13 VITALS — BP 149/79 | HR 80 | Temp 98.4°F | Resp 20 | Ht 63.0 in | Wt 216.0 lb

## 2021-01-13 DIAGNOSIS — Z23 Encounter for immunization: Secondary | ICD-10-CM | POA: Diagnosis not present

## 2021-01-13 DIAGNOSIS — N3090 Cystitis, unspecified without hematuria: Secondary | ICD-10-CM | POA: Diagnosis not present

## 2021-01-13 DIAGNOSIS — I10 Essential (primary) hypertension: Secondary | ICD-10-CM | POA: Diagnosis not present

## 2021-01-13 DIAGNOSIS — F411 Generalized anxiety disorder: Secondary | ICD-10-CM | POA: Diagnosis not present

## 2021-01-13 DIAGNOSIS — M545 Low back pain, unspecified: Secondary | ICD-10-CM

## 2021-01-13 DIAGNOSIS — N898 Other specified noninflammatory disorders of vagina: Secondary | ICD-10-CM

## 2021-01-13 LAB — POCT URINALYSIS DIPSTICK
Bilirubin, UA: NEGATIVE
Blood, UA: NEGATIVE
Glucose, UA: NEGATIVE
Ketones, UA: NEGATIVE
Nitrite, UA: NEGATIVE
Protein, UA: NEGATIVE
Spec Grav, UA: 1.02 (ref 1.010–1.025)
Urobilinogen, UA: 0.2 E.U./dL
pH, UA: 6 (ref 5.0–8.0)

## 2021-01-13 MED ORDER — CETIRIZINE HCL 10 MG PO TBDP: 1.0000 | ORAL_TABLET | Freq: Every day | ORAL | 5 refills | Status: DC

## 2021-01-13 MED ORDER — LISINOPRIL-HYDROCHLOROTHIAZIDE 20-25 MG PO TABS: 1.0000 | ORAL_TABLET | Freq: Every day | ORAL | 3 refills | Status: DC

## 2021-01-13 NOTE — Patient Instructions (Addendum)
Follow-up in October as before call if you need me sooner.  New medication for blood pressure is lisinopril /HCTZ 20/25, ONE  daily.   this is in your new blood pressure pill.  You need to stop hCTZ 25 mg tablet thaT YOU HAVE BEEN TAKING AND START THE NEW ONE.  YOUR BLOOD PRESSURE IS NOT AT GOAL  New for allergies itching and anxiety is Zyrtec  1 daily.  Urine is being sent to be tested for infection if this shows that you have an infection you will be contacted next week and an antibiotic prescribed..   For your back pain which is aggravated by twisting and movement you may use Tylenol 500 mg 1 twice daily as needed.    Flu Vaccine today.  Thanks for choosing [facility], we consider it a privelige to serve you.

## 2021-01-16 ENCOUNTER — Encounter: Payer: Self-pay | Admitting: Family Medicine

## 2021-01-16 DIAGNOSIS — F411 Generalized anxiety disorder: Secondary | ICD-10-CM | POA: Insufficient documentation

## 2021-01-16 NOTE — Assessment & Plan Note (Signed)
Uncontrolled, med increase and re assess DASH diet and commitment to daily physical activity for a minimum of 30 minutes discussed and encouraged, as a part of hypertension management. The importance of attaining a healthy weight is also discussed.  BP/Weight 01/13/2021 01/08/2021 12/23/2020 11/18/2020 11/14/2020 0/76/8088 04/23/313  Systolic BP 945 859 292 446 286 381 771  Diastolic BP 79 75 70 68 80 80 74  Wt. (Lbs) 216 - - - 215 213 -  BMI 38.26 - - - 38.09 37.73 -

## 2021-01-16 NOTE — Assessment & Plan Note (Signed)
Increased anxiety manifest as itching of the forearms, no visible rash, add zyrtec

## 2021-01-16 NOTE — Assessment & Plan Note (Signed)
Mild external vulva itch with voiding and increased low back pain, abn CCUA, will f/u on culture

## 2021-01-16 NOTE — Assessment & Plan Note (Signed)
  Patient re-educated about  the importance of commitment to a  minimum of 150 minutes of exercise per week as able.  The importance of healthy food choices with portion control discussed, as well as eating regularly and within a 12 hour window most days. The need to choose "clean , green" food 50 to 75% of the time is discussed, as well as to make water the primary drink and set a goal of 64 ounces water daily.    Weight /BMI 01/13/2021 11/14/2020 11/01/2020  WEIGHT 216 lb 215 lb 213 lb  HEIGHT 5\' 3"  5\' 3"  5\' 3"   BMI 38.26 kg/m2 38.09 kg/m2 37.73 kg/m2

## 2021-01-16 NOTE — Progress Notes (Signed)
   Sheri Jones     MRN: [id number]      DOB: January 08, 1946   HPI Ms. Sheri Jones is here with c/o itching of her forearms with no rash, was seen in UC , cream prescribed, no benefit C/o low back pain, no inciting trauma  Preventive health is updated, specifically  Cancer screening and Immunization.      ROS Denies recent fever or chills. Denies sinus pressure, nasal congestion, ear pain or sore throat. Denies chest congestion, productive cough or wheezing. Denies chest pains, palpitations and leg swelling Denies abdominal pain, nausea, vomiting,diarrhea or constipation.   Denies dysuria, frequency, hesitancy or incontinence.  Denies headaches, seizures, numbness, or tingling. Denies depression, c/o mild anxiety or insomnia.  PE  BP (!) 149/79 (BP Location: Right Arm, Patient Position: Sitting, Cuff Size: Large)   Pulse 80   Temp 98.4 F (36.9 C)   Resp 20   Ht 5\' 3"  (1.6 m)   Wt 216 lb (98 kg)   SpO2 95%   BMI 38.26 kg/m   Patient alert and oriented and in no cardiopulmonary distress.  HEENT: No facial asymmetry, EOMI,     Neck supple .  Chest: Clear to auscultation bilaterally.  CVS: S1, S2 no murmurs, no S3.Regular rate.  ABD: Soft non tender.   Ext: No edema  MS: Decreased though Adequate ROM spine, normal in shoulders, hips and knees.  Skin: Intact, no ulcerations or rash noted.  Psych: Good eye contact, normal affect. Memory intact not anxious or depressed appearing.  CNS: CN 2-12 intact, power,  normal throughout.no focal deficits noted.   Assessment & Plan  Essential hypertension Uncontrolled, med increase and re assess DASH diet and commitment to daily physical activity for a minimum of 30 minutes discussed and encouraged, as a part of hypertension management. The importance of attaining a healthy weight is also discussed.  BP/Weight 01/13/2021 01/08/2021 12/23/2020 11/18/2020 11/14/2020 10/20/4763 07/27/5033  Systolic BP 465 681 275 170 142 017 494   Diastolic BP 79 75 70 68 80 80 74  Wt. (Lbs) 216 - - - 215 213 -  BMI 38.26 - - - 38.09 37.73 -       Morbid obesity ([place])  Patient re-educated about  the importance of commitment to a  minimum of 150 minutes of exercise per week as able.  The importance of healthy food choices with portion control discussed, as well as eating regularly and within a 12 hour window most days. The need to choose "clean , green" food 50 to 75% of the time is discussed, as well as to make water the primary drink and set a goal of 64 ounces water daily.    Weight /BMI 01/13/2021 11/14/2020 11/01/2020  WEIGHT 216 lb 215 lb 213 lb  HEIGHT 5\' 3"  5\' 3"  5\' 3"   BMI 38.26 kg/m2 38.09 kg/m2 37.73 kg/m2      Cystitis Mild external vulva itch with voiding and increased low back pain, abn CCUA, will f/u on culture  GAD (generalized anxiety disorder) Increased anxiety manifest as itching of the forearms, no visible rash, add zyrtec

## 2021-01-19 LAB — URINE CULTURE

## 2021-01-20 ENCOUNTER — Other Ambulatory Visit: Payer: Self-pay

## 2021-01-20 ENCOUNTER — Ambulatory Visit (HOSPITAL_COMMUNITY)
Admission: RE | Admit: 2021-01-20 | Discharge: 2021-01-20 | Disposition: A | Discharge status: Home / Self Care | Payer: Federal, State, Local not specified - PPO | Source: Ambulatory Visit | Attending: Family Medicine | Admitting: Family Medicine

## 2021-01-20 ENCOUNTER — Inpatient Hospital Stay (HOSPITAL_COMMUNITY): Payer: Federal, State, Local not specified - PPO

## 2021-01-20 VITALS — BP 133/71 | HR 76 | Temp 97.7°F | Resp 18

## 2021-01-20 DIAGNOSIS — Z1231 Encounter for screening mammogram for malignant neoplasm of breast: Secondary | ICD-10-CM | POA: Insufficient documentation

## 2021-01-20 DIAGNOSIS — E538 Deficiency of other specified B group vitamins: Secondary | ICD-10-CM | POA: Diagnosis not present

## 2021-01-20 MED ORDER — CYANOCOBALAMIN 1000 MCG/ML IJ SOLN
1000.0000 ug | Freq: Once | INTRAMUSCULAR | Status: AC
  Administered 2021-01-20: 1000 ug via INTRAMUSCULAR
  Filled 2021-01-20: qty 1

## 2021-01-20 NOTE — Progress Notes (Signed)
Patient presents today for Vitamin B12 injection.  Patient is in satisfactory condition with no complaints voiced today.  Vital signs are stable.  We will proceed with treatment per MD orders.   Patient tolerated injection with no complaints voiced.  Site clean and dry with no bruising or swelling noted.  No complaints of pain.  Discharged with vital signs stable and no signs or symptoms of distress noted.

## 2021-01-20 NOTE — Patient Instructions (Signed)
[facility]  Discharge Instructions: Thank you for choosing [facility] to provide your oncology and hematology care.  If you have a lab appointment with the Cancer Center, please come in thru the Main Entrance and check in at the main information desk.  We strive to give you quality time with your provider. You may need to reschedule your appointment if you arrive late (15 or more minutes).  Arriving late affects you and other patients whose appointments are after yours.  Also, if you miss three or more appointments without notifying the office, you may be dismissed from the clinic at the provider's discretion.      For prescription refill requests, have your pharmacy contact our office and allow 72 hours for refills to be completed.     To help prevent nausea and vomiting after your treatment, we encourage you to take your nausea medication as directed.  BELOW ARE SYMPTOMS THAT SHOULD BE REPORTED IMMEDIATELY: *FEVER GREATER THAN 100.4 F (38 C) OR HIGHER *CHILLS OR SWEATING *NAUSEA AND VOMITING THAT IS NOT CONTROLLED WITH YOUR NAUSEA MEDICATION *UNUSUAL SHORTNESS OF BREATH *UNUSUAL BRUISING OR BLEEDING *URINARY PROBLEMS (pain or burning when urinating, or frequent urination) *BOWEL PROBLEMS (unusual diarrhea, constipation, pain near the anus) TENDERNESS IN MOUTH AND THROAT WITH OR WITHOUT PRESENCE OF ULCERS (sore throat, sores in mouth, or a toothache) UNUSUAL RASH, SWELLING OR PAIN  UNUSUAL VAGINAL DISCHARGE OR ITCHING   Items with * indicate a potential emergency and should be followed up as soon as possible or go to the Emergency Department if any problems should occur.  Should you have questions after your visit or need to cancel or reschedule your appointment, please contact [facility] [phone]  and follow the prompts.  Office hours are 8:00 a.m. to 4:30 p.m. Monday - Friday. Please note that voicemails left after 4:00 p.m. may not be  returned until the following business day.  We are closed weekends and major holidays. You have access to a nurse at all times for urgent questions. Please call the main number to the clinic [phone] and follow the prompts.  For any non-urgent questions, you may also contact your provider using MyChart. We now offer e-Visits for anyone 18 and older to request care online for non-urgent symptoms. For details visit [email or web address].   Also download the MyChart app! Go to the app store, search "MyChart", open the app, select [facility], and log in with your MyChart username and password.  Due to Covid, a mask is required upon entering the hospital/clinic. If you do not have a mask, one will be given to you upon arrival. For doctor visits, patients may have 1 support person aged 18 or older with them. For treatment visits, patients cannot have anyone with them due to current Covid guidelines and our immunocompromised population.  

## 2021-02-14 ENCOUNTER — Encounter: Payer: Self-pay | Admitting: Family Medicine

## 2021-02-14 ENCOUNTER — Ambulatory Visit (INDEPENDENT_AMBULATORY_CARE_PROVIDER_SITE_OTHER): Payer: Federal, State, Local not specified - PPO | Admitting: Family Medicine

## 2021-02-14 ENCOUNTER — Other Ambulatory Visit: Payer: Self-pay

## 2021-02-14 VITALS — BP 124/82 | HR 83 | Resp 14 | Ht 63.0 in | Wt 217.0 lb

## 2021-02-14 DIAGNOSIS — Z13220 Encounter for screening for lipoid disorders: Secondary | ICD-10-CM

## 2021-02-14 DIAGNOSIS — Z0000 Encounter for general adult medical examination without abnormal findings: Secondary | ICD-10-CM | POA: Diagnosis not present

## 2021-02-14 DIAGNOSIS — I10 Essential (primary) hypertension: Secondary | ICD-10-CM

## 2021-02-14 NOTE — Patient Instructions (Addendum)
Annual exam in 366 days   Fasting lipid, cmp and EGFrand TSH as soon as possible ( next week Friday)  Need covid vaccine   Need TdAP, get them at the pharmacy    F/U in 4.5 months, re evaluate weight and general health  Goals, stop eating at 7 pm  Change from chips and candy to vegetables and fruit fresh for snacks, ex carrots and apples  Water 64 ounces / day at least  Continue exercise great for the health odf a 75 year old lady  Thanks for choosing [facility], we consider it a privelige to serve you.

## 2021-02-14 NOTE — Assessment & Plan Note (Signed)

## 2021-02-14 NOTE — Progress Notes (Signed)
    Sheri Jones     MRN: [id number]      DOB: 1946/04/07  HPI: Patient is in for annual physical exam. No other health concerns are expressed or addressed at the visit. Recent labs,  are reviewed. Immunization is reviewed , and  updated if needed.   PE: BP 124/82   Pulse 83   Resp 14   Ht 5\' 3"  (1.6 m)   Wt 217 lb (98.4 kg)   BMI 38.44 kg/m     Pleasant  female, alert and oriented x 3, in no cardio-pulmonary distress. Afebrile. HEENT No facial trauma or asymetry. Sinuses non tender.  Extra occullar muscles intact.. External ears normal, . Neck: supple, no adenopathy,JVD or thyromegaly.No bruits.  Chest: Clear to ascultation bilaterally.No crackles or wheezes. Non tender to palpation    Cardiovascular system; Heart sounds normal,  S1 and  S2 ,no S3.  No murmur, or thrill. Apical beat not displaced Peripheral pulses normal.  Abdomen: Soft, non tender, no organomegaly or masses. No bruits. Bowel sounds normal. No guarding, tenderness or rebound.      Musculoskeletal exam: Decreased  ROM of spine, hips , shoulders and knees.  deformity ,swelling and  crepitus noted. No muscle wasting or atrophy.   Neurologic: Cranial nerves 2 to 12 intact. Power, tone ,sensation and reflexes normal throughout.  disturbance in gait. No tremor.  Skin: Intact, no ulceration, erythema , scaling or rash noted. Pigmentation normal throughout  Psych; Normal mood and affect. Judgement and concentration normal   Assessment & Plan:  Encounter for annual physical exam Annual exam as documented. Counseling done  re healthy lifestyle involving commitment to 150 minutes exercise per week, heart healthy diet, and attaining healthy weight.The importance of adequate sleep also discussed. Regular seat belt use and home safety, is also discussed. Changes in health habits are decided on by the patient with goals and time frames  set for achieving them. Immunization and cancer  screening needs are specifically addressed at this visit.

## 2021-02-15 ENCOUNTER — Ambulatory Visit (INDEPENDENT_AMBULATORY_CARE_PROVIDER_SITE_OTHER): Payer: Federal, State, Local not specified - PPO

## 2021-02-15 VITALS — Ht 63.0 in | Wt 217.0 lb

## 2021-02-15 DIAGNOSIS — Z0000 Encounter for general adult medical examination without abnormal findings: Secondary | ICD-10-CM

## 2021-02-15 NOTE — Patient Instructions (Signed)
Sheri Jones , Thank you for taking time to come for your Medicare Wellness Visit. I appreciate your ongoing commitment to your health goals. Please review the following plan we discussed and let me know if I can assist you in the future.   Screening recommendations/referrals: Colonoscopy: Done 09/28/2020 Repeat in 10 years  Mammogram: Done 01/20/2021 Repeat annually  Bone Density: Done 02/11/2020 Repeat every 2 years  Recommended yearly ophthalmology/optometry visit for glaucoma screening and checkup Recommended yearly dental visit for hygiene and checkup  Vaccinations: Influenza vaccine: Done 01/13/2021 Pneumococcal vaccine: Done 03/16/2013 and 11/24/2013 Tdap vaccine: Done 08/16/2010 Repeat in 10 years  Shingles vaccine: Done 03/04/2008, 11/11/2017 and 01/08/2018  Covid: Done 05/19/2019, 06/15/2019, 02/25/2020 and 09/05/2020  Advanced directives: Advance directive discussed with you today. I have provided a copy for you to complete at home and have notarized. Once this is complete please bring a copy in to our office so we can scan it into your chart.   Conditions/risks identified: Aim for 30 minutes of exercise or brisk walking each day, drink 6-8 glasses of water and eat lots of fruits and vegetables. KEEP UP THE GOOD WORK!!!  Next appointment: Follow up in one year for your annual wellness visit 2023.   Preventive Care 37 Years and Older, Female Preventive care refers to lifestyle choices and visits with your health care provider that can promote health and wellness. What does preventive care include? A yearly physical exam. This is also called an annual well check. Dental exams once or twice a year. Routine eye exams. Ask your health care provider how often you should have your eyes checked. Personal lifestyle choices, including: Daily care of your teeth and gums. Regular physical activity. Eating a healthy diet. Avoiding tobacco and drug use. Limiting alcohol use. Practicing safe  sex. Taking low-dose aspirin every day. Taking vitamin and mineral supplements as recommended by your health care provider. What happens during an annual well check? The services and screenings done by your health care provider during your annual well check will depend on your age, overall health, lifestyle risk factors, and family history of disease. Counseling  Your health care provider may ask you questions about your: Alcohol use. Tobacco use. Drug use. Emotional well-being. Home and relationship well-being. Sexual activity. Eating habits. History of falls. Memory and ability to understand (cognition). Work and work Statistician. Reproductive health. Screening  You may have the following tests or measurements: Height, weight, and BMI. Blood pressure. Lipid and cholesterol levels. These may be checked every 5 years, or more frequently if you are over 67 years old. Skin check. Lung cancer screening. You may have this screening every year starting at age 24 if you have a 30-pack-year history of smoking and currently smoke or have quit within the past 15 years. Fecal occult blood test (FOBT) of the stool. You may have this test every year starting at age 82. Flexible sigmoidoscopy or colonoscopy. You may have a sigmoidoscopy every 5 years or a colonoscopy every 10 years starting at age 93. Hepatitis C blood test. Hepatitis B blood test. Sexually transmitted disease (STD) testing. Diabetes screening. This is done by checking your blood sugar (glucose) after you have not eaten for a while (fasting). You may have this done every 1-3 years. Bone density scan. This is done to screen for osteoporosis. You may have this done starting at age 60. Mammogram. This may be done every 1-2 years. Talk to your health care provider about how often you should have  regular mammograms. Talk with your health care provider about your test results, treatment options, and if necessary, the need for more  tests. Vaccines  Your health care provider may recommend certain vaccines, such as: Influenza vaccine. This is recommended every year. Tetanus, diphtheria, and acellular pertussis (Tdap, Td) vaccine. You may need a Td booster every 10 years. Zoster vaccine. You may need this after age 39. Pneumococcal 13-valent conjugate (PCV13) vaccine. One dose is recommended after age 59. Pneumococcal polysaccharide (PPSV23) vaccine. One dose is recommended after age 21. Talk to your health care provider about which screenings and vaccines you need and how often you need them. This information is not intended to replace advice given to you by your health care provider. Make sure you discuss any questions you have with your health care provider. Document Released: 05/06/2015 Document Revised: 12/28/2015 Document Reviewed: 02/08/2015 Elsevier Interactive Patient Education  2017 [employer] Prevention in the Home Falls can cause injuries. They can happen to people of all ages. There are many things you can do to make your home safe and to help prevent falls. What can I do on the outside of my home? Regularly fix the edges of walkways and driveways and fix any cracks. Remove anything that might make you trip as you walk through a door, such as a raised step or threshold. Trim any bushes or trees on the path to your home. Use bright outdoor lighting. Clear any walking paths of anything that might make someone trip, such as rocks or tools. Regularly check to see if handrails are loose or broken. Make sure that both sides of any steps have handrails. Any raised decks and porches should have guardrails on the edges. Have any leaves, snow, or ice cleared regularly. Use sand or salt on walking paths during winter. Clean up any spills in your garage right away. This includes oil or grease spills. What can I do in the bathroom? Use night lights. Install grab bars by the toilet and in the tub and shower.  Do not use towel bars as grab bars. Use non-skid mats or decals in the tub or shower. If you need to sit down in the shower, use a plastic, non-slip stool. Keep the floor dry. Clean up any water that spills on the floor as soon as it happens. Remove soap buildup in the tub or shower regularly. Attach bath mats securely with double-sided non-slip rug tape. Do not have throw rugs and other things on the floor that can make you trip. What can I do in the bedroom? Use night lights. Make sure that you have a light by your bed that is easy to reach. Do not use any sheets or blankets that are too big for your bed. They should not hang down onto the floor. Have a firm chair that has side arms. You can use this for support while you get dressed. Do not have throw rugs and other things on the floor that can make you trip. What can I do in the kitchen? Clean up any spills right away. Avoid walking on wet floors. Keep items that you use a lot in easy-to-reach places. If you need to reach something above you, use a strong step stool that has a grab bar. Keep electrical cords out of the way. Do not use floor polish or wax that makes floors slippery. If you must use wax, use non-skid floor wax. Do not have throw rugs and other things on the floor that  can make you trip. What can I do with my stairs? Do not leave any items on the stairs. Make sure that there are handrails on both sides of the stairs and use them. Fix handrails that are broken or loose. Make sure that handrails are as long as the stairways. Check any carpeting to make sure that it is firmly attached to the stairs. Fix any carpet that is loose or worn. Avoid having throw rugs at the top or bottom of the stairs. If you do have throw rugs, attach them to the floor with carpet tape. Make sure that you have a light switch at the top of the stairs and the bottom of the stairs. If you do not have them, ask someone to add them for you. What else  can I do to help prevent falls? Wear shoes that: Do not have high heels. Have rubber bottoms. Are comfortable and fit you well. Are closed at the toe. Do not wear sandals. If you use a stepladder: Make sure that it is fully opened. Do not climb a closed stepladder. Make sure that both sides of the stepladder are locked into place. Ask someone to hold it for you, if possible. Clearly mark and make sure that you can see: Any grab bars or handrails. First and last steps. Where the edge of each step is. Use tools that help you move around (mobility aids) if they are needed. These include: Canes. Walkers. Scooters. Crutches. Turn on the lights when you go into a dark area. Replace any light bulbs as soon as they burn out. Set up your furniture so you have a clear path. Avoid moving your furniture around. If any of your floors are uneven, fix them. If there are any pets around you, be aware of where they are. Review your medicines with your doctor. Some medicines can make you feel dizzy. This can increase your chance of falling. Ask your doctor what other things that you can do to help prevent falls. This information is not intended to replace advice given to you by your health care provider. Make sure you discuss any questions you have with your health care provider. Document Released: 02/03/2009 Document Revised: 09/15/2015 Document Reviewed: 05/14/2014 Elsevier Interactive Patient Education  2017 [employer].

## 2021-02-15 NOTE — Progress Notes (Signed)
Subjective:   Sheri Jones is a 75 y.o. female who presents for Medicare Annual (Subsequent) preventive examination. Virtual Visit via Telephone Note  I connected with  [name] on 02/15/21 at  9:40 AM EDT by telephone and verified that I am speaking with the correct person using two identifiers.  Location: Patient: Home Provider: [facility] Persons participating in the virtual visit: patient/Nurse Health Advisor   I discussed the limitations, risks, security and privacy concerns of performing an evaluation and management service by telephone and the availability of in person appointments. The patient expressed understanding and agreed to proceed.  Interactive audio and video telecommunications were attempted between this nurse and patient, however failed, due to patient having technical difficulties OR patient did not have access to video capability.  We continued and completed visit with audio only.  Some vital signs may be absent or patient reported.   Chriss Driver, LPN  Review of Systems     Cardiac Risk Factors include: advanced age (>58men, >9 women);hypertension;obesity (BMI >30kg/m2);sedentary lifestyle     Objective:    Today's Vitals   02/15/21 0950 02/15/21 0951  Weight: 217 lb (98.4 kg)   Height: 5\' 3"  (1.6 m)   PainSc:  0-No pain   Body mass index is 38.44 kg/m.  Advanced Directives 02/15/2021 01/20/2021 12/23/2020 11/18/2020 09/28/2020 08/24/2020 07/25/2020  Does Patient Have a Medical Advance Directive? Yes Yes Yes Yes Yes Yes Yes  Type of Paramedic of [place];Living will [place];Living will [place];Living will [place];Living will [place];Living will [place];Living will [place];Living will  Does patient want to make changes to medical advance directive? - No - Patient declined - No - Patient declined - No -  Patient declined No - Patient declined  Copy of [place] in Chart? Yes - validated most recent copy scanned in chart (See row information) No - copy requested No - copy requested No - copy requested - No - copy requested No - copy requested  Would patient like information on creating a medical advance directive? - No - Patient declined No - Patient declined No - Patient declined - No - Patient declined No - Patient declined    Current Medications (verified) Outpatient Encounter Medications as of 02/15/2021  Medication Sig   acetaminophen (TYLENOL) 325 MG tablet Take 650 mg by mouth every 6 (six) hours as needed for mild pain.   Calcium Carb-Cholecalciferol (CALCIUM 600+D) 600-800 MG-UNIT TABS Take 2 tablets by mouth daily.   Cetirizine HCl 10 MG TBDP Take 1 tablet by mouth daily.   cholecalciferol (VITAMIN D3) 25 MCG (1000 UT) tablet Take 1,000 Units by mouth daily.   cyanocobalamin (,VITAMIN B-12,) 1000 MCG/ML injection Inject 1,000 mcg into the muscle every 30 (thirty) days.   diphenhydrAMINE (BENADRYL) 25 MG tablet Take 25 mg by mouth every 8 (eight) hours as needed for allergies.   lisinopril-hydrochlorothiazide (ZESTORETIC) 20-25 MG tablet Take 1 tablet by mouth daily.   Multiple Vitamins-Minerals (CENTRUM ULTRA WOMENS PO) Take 1 tablet by mouth daily.   pantoprazole (PROTONIX) 20 MG tablet Take 20 mg by mouth daily as needed for heartburn or indigestion.   potassium chloride (KLOR-CON) 10 MEQ tablet TAKE 1 TABLET BY MOUTH 2 TIMES DAILY.   [DISCONTINUED] temazepam (RESTORIL) 7.5 MG capsule Take 1 capsule (7.5 mg total) by mouth at bedtime as needed for sleep. (Patient not taking:  Reported on 05/19/2019)   No facility-administered encounter medications on file as of 02/15/2021.    Allergies (verified) Patient has no known allergies.   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   Hypertension    Phreesia 02/01/2020   Obesity, unspecified    Osteoarthrosis, unspecified whether generalized or localized, lower leg    Unspecified essential hypertension    Vitamin B12 deficiency disease 10/28/2008   Qualifier: Diagnosis of  By: Moshe Cipro MD, Joycelyn Schmid     Past Surgical History:  Procedure Laterality Date   COLONOSCOPY  2008   COLONOSCOPY N/A 09/28/2020   Procedure: COLONOSCOPY;  Surgeon: Daneil Dolin, MD;  Location[facility];  Service: Endoscopy;  Laterality: N/A;  ASA II / 1:00   EXAM UNDER ANESTHESIA WITH MANIPULATION OF KNEE Right 05/11/2015   Procedure: EXAM UNDER ANESTHESIA WITH MANIPULATION OF KNEE;  Surgeon: Carole Civil, MD;  Location: [facility];  Service: Orthopedics;  Laterality: Right;   EYE SURGERY Left 04/27/2018   cataract   EYE SURGERY Right 06/17/2018   cataract   GASTRIC BYPASS  2004   JOINT REPLACEMENT N/A    Phreesia 02/01/2020   TOTAL KNEE ARTHROPLASTY Right 02/23/2015   Procedure: RIGHT TOTAL KNEE ARTHROPLASTY;  Surgeon: Carole Civil, MD;  Location: [facility];  Service: Orthopedics;  Laterality: Right;   TOTAL KNEE ARTHROPLASTY Left 03/24/2019   Procedure: TOTAL KNEE ARTHROPLASTY;  Surgeon: Carole Civil, MD;  Location: [facility];  Service: Orthopedics;  Laterality: Left;   Family History  Problem Relation Age of Onset   Hypertension Mother    Hypertension Sister    Hypertension Sister    Stroke Sister 84   Hypertension Sister    Heart disease Father 23       massive heart attack    Breast cancer Maternal Aunt    Lung cancer Maternal Aunt    Social History   Socioeconomic History   Marital status: Divorced    Spouse name: Not on file   Number of children: 0   Years of education: Not on file   Highest education level: Not on file  Occupational History   Not on file  Tobacco Use   Smoking  status: Never   Smokeless tobacco: Never  Vaping Use   Vaping Use: Never used  Substance and Sexual Activity   Alcohol use: No   Drug use: No   Sexual activity: Not Currently  Other Topics Concern   Not on file  Social History Narrative   Divorced.   Social Determinants of Health   Financial Resource Strain: Low Risk    Difficulty of Paying Living Expenses: Not hard at all  Food Insecurity: No Food Insecurity   Worried About Charity fundraiser in the Last Year: Never true   [place] in the Last Year: Never true  Transportation Needs: No Transportation Needs   Lack of Transportation (Medical): No   Lack of Transportation (Non-Medical): No  Physical Activity: Sufficiently Active   Days of Exercise per Week: 5 days   Minutes of Exercise per Session: 60 min  Stress: No Stress Concern Present   Feeling of Stress : Not at all  Social Connections: Moderately Integrated  Frequency of Communication with Friends and Family: More than three times a week   Frequency of Social Gatherings with Friends and Family: More than three times a week   Attends Religious Services: 1 to 4 times per year   Active Member of [employer] or Organizations: Yes   Attends Archivist Meetings: 1 to 4 times per year   Marital Status: Divorced    Tobacco Counseling Counseling given: Not Answered   Clinical Intake:  Pre-visit preparation completed: Yes  Pain : No/denies pain Pain Score: 0-No pain     BMI - recorded: 38.44 Nutritional Status: BMI > 30  Obese Nutritional Risks: None Diabetes: No  How often do you need to have someone help you when you read instructions, pamphlets, or other written materials from your doctor or pharmacy?: 1 - Never  Diabetic?no  Interpreter Needed?: No  Information entered by :: MJ Latese Dufault, LPN   Activities of Daily Living In your present state of health, do you have any difficulty performing the following activities: 02/15/2021 08/19/2020   Hearing? N N  Vision? N N  Difficulty concentrating or making decisions? N N  Walking or climbing stairs? N N  Dressing or bathing? N N  Doing errands, shopping? N N  Preparing Food and eating ? N -  Using the Toilet? N -  In the past six months, have you accidently leaked urine? N -  Do you have problems with loss of bowel control? N -  Managing your Medications? N -  Managing your Finances? N -  Housekeeping or managing your Housekeeping? N -  Some recent data might be hidden    Patient Care Team: Fayrene Helper, MD as PCP - General K[place], Manon Hilding, PA-C (Inactive) as Physician Assistant (Physician Assistant) Carole Civil, MD as Consulting Physician (Orthopedic Surgery) Gala Romney Cristopher Estimable, MD as Consulting Physician (Gastroenterology)  Indicate any recent Medical Services you may have received from other than [facility] providers in the past year (date may be approximate).     Assessment:   This is a routine wellness examination for [place].  Hearing/Vision screen Hearing Screening - Comments:: No hearing issues.  Vision Screening - Comments:: Glasses. Overdue. [facility] [place] issues and exercise activities discussed: Current Exercise Habits: Home exercise routine, Type of exercise: walking;stretching, Time (Minutes): 60, Frequency (Times/Week): 5, Weekly Exercise (Minutes/Week): 300, Intensity: Mild, Exercise limited by: cardiac condition(s)   Goals Addressed             This Visit's Progress    Have 3 meals a day       Lose 10#. Travel more.        Depression Screen PHQ 2/9 Scores 02/15/2021 02/14/2021 01/13/2021 11/01/2020 09/15/2020 08/19/2020 08/04/2020  PHQ - 2 Score 0 0 0 0 0 0 0  PHQ- 9 Score - 0 - - - - -    Fall Risk Fall Risk  02/15/2021 02/14/2021 02/14/2021 01/13/2021 11/01/2020  Falls in the past year? 0 0 0 0 0  Number falls in past yr: 0 - - 0 0  Injury with Fall? 0 - - 0 0  Comment - - - - -  Risk for fall due to :  Impaired vision - - No Fall Risks No Fall Risks  Risk for fall due to: Comment - - - - -  Follow up Falls prevention discussed - - Falls evaluation completed Falls evaluation completed    [place]:  Any stairs in or around the home? No  If so, are there any without handrails? No  Home free of loose throw rugs in walkways, pet beds, electrical cords, etc? Yes  Adequate lighting in your home to reduce risk of falls? Yes   ASSISTIVE DEVICES UTILIZED TO PREVENT FALLS:  Life alert? No  Use of a cane, walker or w/c? No  Grab bars in the bathroom? Yes  Shower chair or bench in shower? Yes  Elevated toilet seat or a handicapped toilet? Yes   TIMED UP AND GO:  Was the test performed? No . Phone visit   Cognitive Function:     6CIT Screen 02/15/2021  What Year? 0 points  What month? 0 points  What time? 0 points  Count back from 20 0 points  Months in reverse 4 points  Repeat phrase 0 points  Total Score 4    Immunizations Immunization History  Administered Date(s) Administered   Fluad Quad(high Dose 65+) 02/17/2019, 02/04/2020, 01/13/2021   Influenza Split 01/11/2012   Influenza Whole 01/19/2009, 12/29/2010   Influenza, High Dose Seasonal PF 02/20/2018   Influenza,inj,Quad PF,6+ Mos 01/14/2013, 12/31/2013, 12/20/2014, 01/05/2016, 01/02/2017   Moderna Sars-Covid-2 Vaccination 05/19/2019, 06/15/2019, 02/25/2020, 09/05/2020   Pneumococcal Conjugate-13 11/24/2013   Pneumococcal Polysaccharide-23 03/16/2013   Tdap 08/16/2010   Zoster Recombinat (Shingrix) 11/11/2017, 01/08/2018   Zoster, Live 03/04/2008    TDAP status: Due, Education has been provided regarding the importance of this vaccine. Advised may receive this vaccine at local pharmacy or Health Dept. Aware to provide a copy of the vaccination record if obtained from local pharmacy or Health Dept. Verbalized acceptance and understanding.  Flu Vaccine status: Up to  date  Pneumococcal vaccine status: Up to date  Covid-19 vaccine status: Completed vaccines  Qualifies for Shingles Vaccine? Yes   Zostavax completed Yes   Shingrix Completed?: Yes  Screening Tests Health Maintenance  Topic Date Due   COVID-19 Vaccine (5 - Booster for Moderna series) 10/31/2020   TETANUS/TDAP  09/15/2021 (Originally 08/15/2020)   MAMMOGRAM  01/21/2023   COLONOSCOPY (Pts 45-36yrs Insurance coverage will need to be confirmed)  09/29/2030   Pneumonia Vaccine 29+ Years old  Completed   INFLUENZA VACCINE  Completed   DEXA SCAN  Completed   Hepatitis C Screening  Completed   Zoster Vaccines- Shingrix  Completed   HPV VACCINES  Aged Out   HEMOGLOBIN A1C  Discontinued    Health Maintenance  Health Maintenance Due  Topic Date Due   COVID-19 Vaccine (5 - Booster for Moderna series) 10/31/2020    Colorectal cancer screening: Type of screening: Colonoscopy. Completed 09/28/2020. Repeat every 10 years  Mammogram status: Completed 01/20/2021. Repeat every year  Bone Density status: Completed 02/11/2020. Results reflect: Bone density results: OSTEOPENIA. Repeat every 2 years.  Lung Cancer Screening: (Low Dose CT Chest recommended if Age 5-80 years, 30 pack-year currently smoking OR have quit w/in 15years.) does not qualify.    Additional Screening:  Hepatitis C Screening: does qualify; Completed 06/28/2015  Vision Screening: Recommended annual ophthalmology exams for early detection of glaucoma and other disorders of the eye. Is the patient up to date with their annual eye exam?  No  Who is the provider or what is the name of the office in which the patient attends annual eye exams? [facility] in [place], Pt unable to remember name. If pt is not established with a provider, would they like to be referred to a provider to establish care? No .  Dental Screening: Recommended annual dental exams for proper oral hygiene  Community Resource Referral / Chronic Care  Management: CRR required this visit?  No   CCM required this visit?  No      Plan:     I have personally reviewed and noted the following in the patient's chart:   Medical and social history Use of alcohol, tobacco or illicit drugs  Current medications and supplements including opioid prescriptions.  Functional ability and status Nutritional status Physical activity Advanced directives List of other physicians Hospitalizations, surgeries, and ER visits in previous 12 months Vitals Screenings to include cognitive, depression, and falls Referrals and appointments  In addition, I have reviewed and discussed with patient certain preventive protocols, quality metrics, and best practice recommendations. A written personalized care plan for preventive services as well as general preventive health recommendations were provided to patient.     Chriss Driver, LPN   [id number]   Nurse Notes: Pt states she is doing well. Up to date on all health maintenance except eye exam. Pt to call and schedule.

## 2021-02-18 IMAGING — CR DG KNEE 1-2V PORT*L*
1 series · 2 of 2 positions shown · non-contrast
Comparison: Plain films left knee 03/02/2019.

CLINICAL DATA: Status post left knee replacement today.

EXAM:
PORTABLE LEFT KNEE - 1-2 VIEW

[Series 1: ap · 0.17mm/px · 2 of 2 slices shown]
[im 1/2]
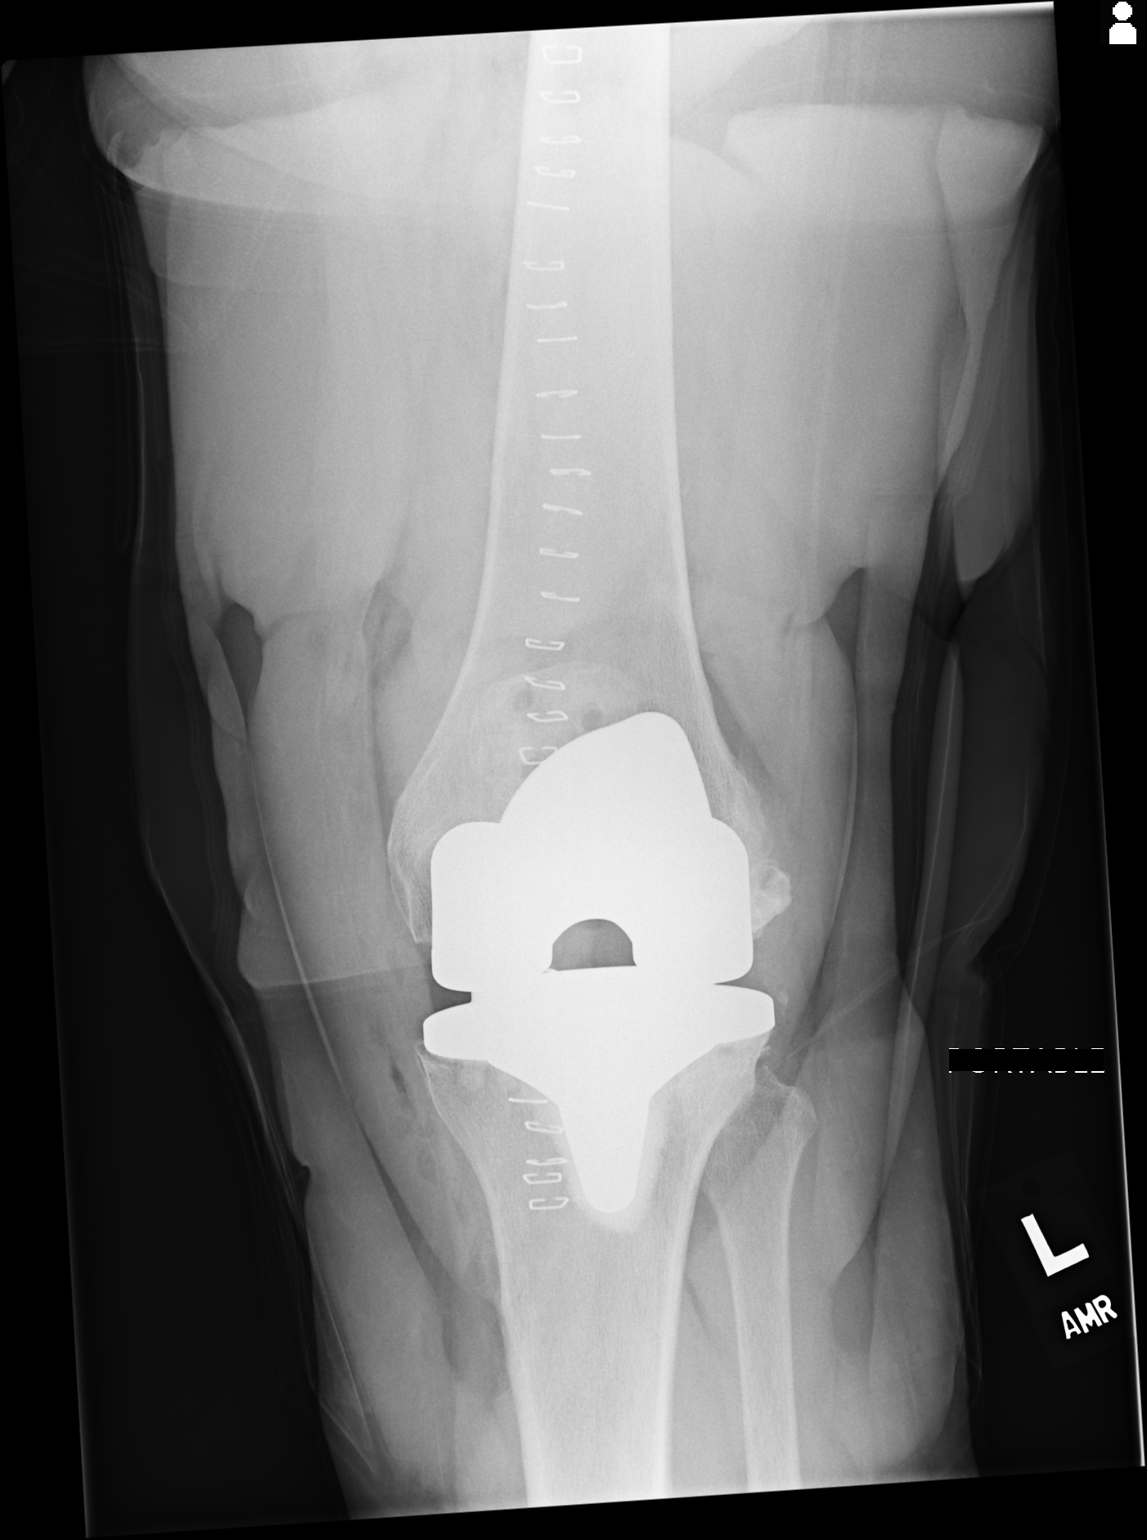
[im 2/2]
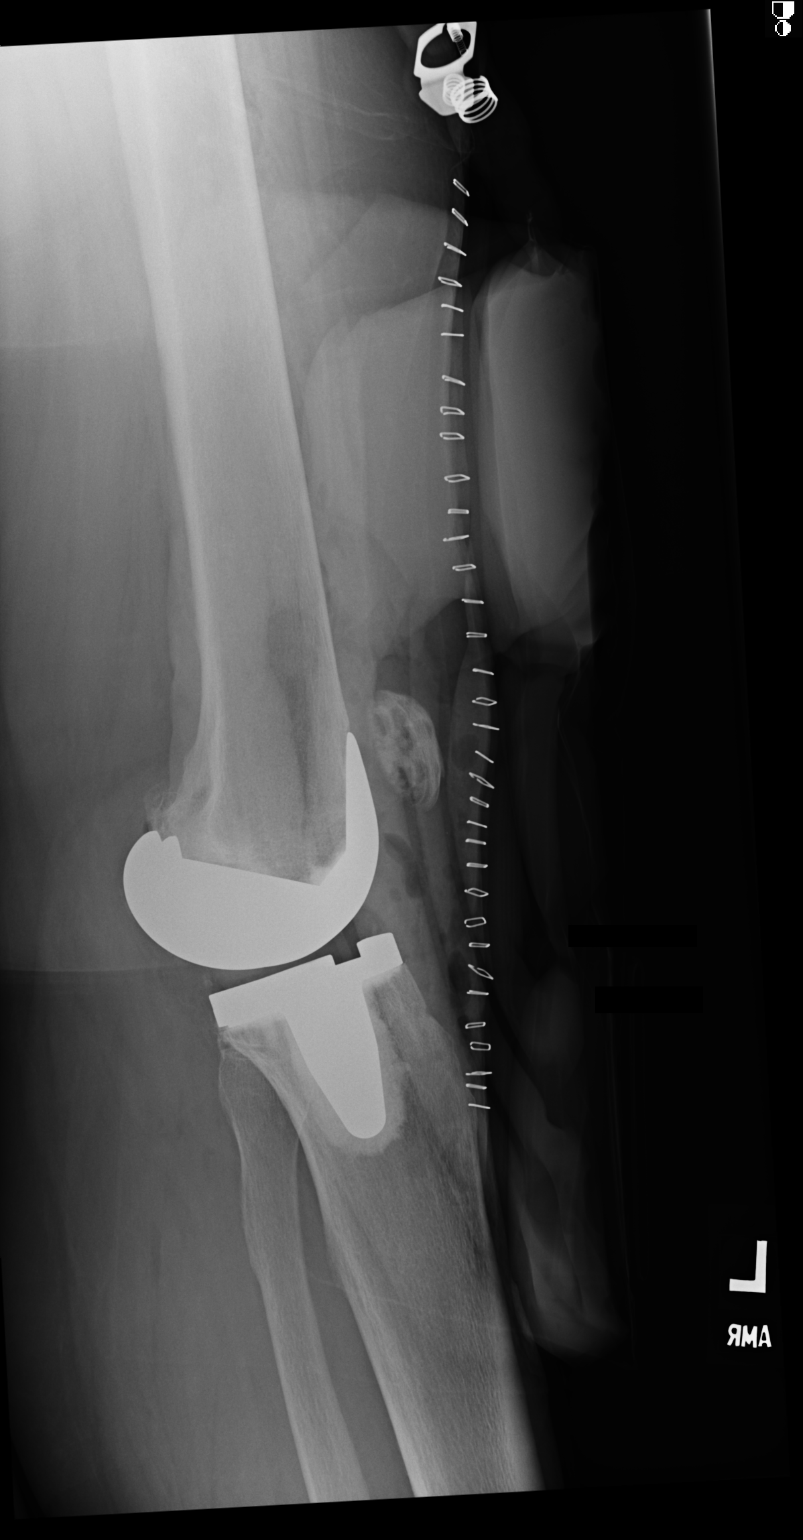

[2 of 2 positions shown; findings below may reference images not displayed]

FINDINGS: New total knee arthroplasty is in place. No acute abnormality is
identified. Gas in the soft tissues and surgical staples noted.
IMPRESSION: Status post left knee replacement.  No acute finding.

## 2021-02-24 ENCOUNTER — Other Ambulatory Visit: Payer: Self-pay

## 2021-02-24 ENCOUNTER — Encounter (HOSPITAL_COMMUNITY): Payer: Self-pay

## 2021-02-24 ENCOUNTER — Inpatient Hospital Stay (HOSPITAL_COMMUNITY): Payer: Federal, State, Local not specified - PPO | Attending: Hematology

## 2021-02-24 VITALS — BP 117/72 | HR 72 | Temp 97.7°F | Resp 18

## 2021-02-24 DIAGNOSIS — E538 Deficiency of other specified B group vitamins: Secondary | ICD-10-CM | POA: Diagnosis present

## 2021-02-24 MED ORDER — CYANOCOBALAMIN 1000 MCG/ML IJ SOLN
1000.0000 ug | Freq: Once | INTRAMUSCULAR | Status: AC
  Administered 2021-02-24: 1000 ug via INTRAMUSCULAR
  Filled 2021-02-24: qty 1

## 2021-02-24 NOTE — Patient Instructions (Signed)
[place]  Discharge Instructions: Thank you for choosing [facility] to provide your oncology and hematology care.  If you have a lab appointment with the [facility], please come in thru the Main Entrance and check in at the main information desk.  Wear comfortable clothing and clothing appropriate for easy access to any Portacath or PICC line.   We strive to give you quality time with your provider. You may need to reschedule your appointment if you arrive late (15 or more minutes).  Arriving late affects you and other patients whose appointments are after yours.  Also, if you miss three or more appointments without notifying the office, you may be dismissed from the clinic at the provider's discretion.      For prescription refill requests, have your pharmacy contact our office and allow 72 hours for refills to be completed.    Today you received the following B12.   To help prevent nausea and vomiting after your treatment, we encourage you to take your nausea medication as directed.  BELOW ARE SYMPTOMS THAT SHOULD BE REPORTED IMMEDIATELY: *FEVER GREATER THAN 100.4 F (38 C) OR HIGHER *CHILLS OR SWEATING *NAUSEA AND VOMITING THAT IS NOT CONTROLLED WITH YOUR NAUSEA MEDICATION *UNUSUAL SHORTNESS OF BREATH *UNUSUAL BRUISING OR BLEEDING *URINARY PROBLEMS (pain or burning when urinating, or frequent urination) *BOWEL PROBLEMS (unusual diarrhea, constipation, pain near the anus) TENDERNESS IN MOUTH AND THROAT WITH OR WITHOUT PRESENCE OF ULCERS (sore throat, sores in mouth, or a toothache) UNUSUAL RASH, SWELLING OR PAIN  UNUSUAL VAGINAL DISCHARGE OR ITCHING   Items with * indicate a potential emergency and should be followed up as soon as possible or go to the Emergency Department if any problems should occur.  Please show the CHEMOTHERAPY ALERT CARD or IMMUNOTHERAPY ALERT CARD at check-in to the Emergency Department and triage nurse.  Should you have  questions after your visit or need to cancel or reschedule your appointment, please contact [facility] [phone]  and follow the prompts.  Office hours are 8:00 a.m. to 4:30 p.m. Monday - Friday. Please note that voicemails left after 4:00 p.m. may not be returned until the following business day.  We are closed weekends and major holidays. You have access to a nurse at all times for urgent questions. Please call the main number to the clinic [phone] and follow the prompts.  For any non-urgent questions, you may also contact your provider using MyChart. We now offer e-Visits for anyone 43 and older to request care online for non-urgent symptoms. For details visit [email or web address].   Also download the MyChart app! Go to the app store, search "MyChart", open the app, select [facility], and log in with your MyChart username and password.  Due to Covid, a mask is required upon entering the hospital/clinic. If you do not have a mask, one will be given to you upon arrival. For doctor visits, patients may have 1 support person aged 27 or older with them. For treatment visits, patients cannot have anyone with them due to current Covid guidelines and our immunocompromised population.

## 2021-02-24 NOTE — Progress Notes (Signed)
Patient tolerated B12 injection with no complaints voiced. Site clean and dry with no bruising or swelling noted at site. See MAR for details. Band aid applied.  Patient stable during and after injection. VSS with discharge and left in satisfactory condition with no s/s of distress noted. 

## 2021-02-25 LAB — LIPID PANEL
Chol/HDL Ratio: 2.5 ratio (ref 0.0–4.4)
Cholesterol, Total: 190 mg/dL (ref 100–199)
HDL: 76 mg/dL (ref >39)
LDL Chol Calc (NIH): 100 mg/dL — ABNORMAL HIGH (ref 0–99)
Triglycerides: 76 mg/dL (ref 0–149)
VLDL Cholesterol Cal: 14 mg/dL (ref 5–40)

## 2021-02-25 LAB — TSH: TSH: 0.866 u[IU]/mL (ref 0.450–4.500)

## 2021-02-25 LAB — CMP14+EGFR
ALT: 21 IU/L (ref 0–32)
AST: 26 IU/L (ref 0–40)
Albumin/Globulin Ratio: 1.6 (ref 1.2–2.2)
Albumin: 4.1 g/dL (ref 3.7–4.7)
Alkaline Phosphatase: 98 IU/L (ref 44–121)
BUN/Creatinine Ratio: 23 (ref 12–28)
BUN: 23 mg/dL (ref 8–27)
Bilirubin Total: 0.4 mg/dL (ref 0.0–1.2)
CO2: 23 mmol/L (ref 20–29)
Calcium: 9 mg/dL (ref 8.7–10.3)
Chloride: 107 mmol/L — ABNORMAL HIGH (ref 96–106)
Creatinine, Ser: 1.01 mg/dL — ABNORMAL HIGH (ref 0.57–1.00)
Globulin, Total: 2.6 g/dL (ref 1.5–4.5)
Glucose: 86 mg/dL (ref 70–99)
Potassium: 4.4 mmol/L (ref 3.5–5.2)
Sodium: 142 mmol/L (ref 134–144)
Total Protein: 6.7 g/dL (ref 6.0–8.5)
eGFR: 59 mL/min/{1.73_m2} — ABNORMAL LOW (ref >59)

## 2021-03-24 ENCOUNTER — Other Ambulatory Visit: Payer: Self-pay

## 2021-03-24 ENCOUNTER — Encounter (HOSPITAL_COMMUNITY): Payer: Self-pay

## 2021-03-24 ENCOUNTER — Inpatient Hospital Stay (HOSPITAL_COMMUNITY): Payer: Federal, State, Local not specified - PPO | Attending: Hematology

## 2021-03-24 VITALS — BP 111/58 | HR 72 | Temp 97.1°F | Resp 18 | Ht 64.0 in | Wt 215.8 lb

## 2021-03-24 DIAGNOSIS — E538 Deficiency of other specified B group vitamins: Secondary | ICD-10-CM | POA: Diagnosis present

## 2021-03-24 MED ORDER — CYANOCOBALAMIN 1000 MCG/ML IJ SOLN
1000.0000 ug | Freq: Once | INTRAMUSCULAR | Status: AC
Start: 2021-03-24 — End: 2021-03-24
  Administered 2021-03-24: 1000 ug via INTRAMUSCULAR
  Filled 2021-03-24: qty 1

## 2021-03-24 NOTE — Patient Instructions (Signed)
[facility]  Discharge Instructions: °Thank you for choosing [facility] to provide your oncology and hematology care.  °If you have a lab appointment with the Cancer Center, please come in thru the Main Entrance and check in at the main information desk. ° °Wear comfortable clothing and clothing appropriate for easy access to any Portacath or PICC line.  ° °We strive to give you quality time with your provider. You may need to reschedule your appointment if you arrive late (15 or more minutes).  Arriving late affects you and other patients whose appointments are after yours.  Also, if you miss three or more appointments without notifying the office, you may be dismissed from the clinic at the provider’s discretion.    °  °For prescription refill requests, have your pharmacy contact our office and allow 72 hours for refills to be completed.   ° °Today you received the following B12 injection.     °  °To help prevent nausea and vomiting after your treatment, we encourage you to take your nausea medication as directed. ° °BELOW ARE SYMPTOMS THAT SHOULD BE REPORTED IMMEDIATELY: °*FEVER GREATER THAN 100.4 F (38 °C) OR HIGHER °*CHILLS OR SWEATING °*NAUSEA AND VOMITING THAT IS NOT CONTROLLED WITH YOUR NAUSEA MEDICATION °*UNUSUAL SHORTNESS OF BREATH °*UNUSUAL BRUISING OR BLEEDING °*URINARY PROBLEMS (pain or burning when urinating, or frequent urination) °*BOWEL PROBLEMS (unusual diarrhea, constipation, pain near the anus) °TENDERNESS IN MOUTH AND THROAT WITH OR WITHOUT PRESENCE OF ULCERS (sore throat, sores in mouth, or a toothache) °UNUSUAL RASH, SWELLING OR PAIN  °UNUSUAL VAGINAL DISCHARGE OR ITCHING  ° °Items with * indicate a potential emergency and should be followed up as soon as possible or go to the Emergency Department if any problems should occur. ° °Please show the CHEMOTHERAPY ALERT CARD or IMMUNOTHERAPY ALERT CARD at check-in to the Emergency Department and triage nurse. ° °Should  you have questions after your visit or need to cancel or reschedule your appointment, please contact [facility] [phone]  and follow the prompts.  Office hours are 8:00 a.m. to 4:30 p.m. Monday - Friday. Please note that voicemails left after 4:00 p.m. may not be returned until the following business day.  We are closed weekends and major holidays. You have access to a nurse at all times for urgent questions. Please call the main number to the clinic [phone] and follow the prompts. ° °For any non-urgent questions, you may also contact your provider using MyChart. We now offer e-Visits for anyone 18 and older to request care online for non-urgent symptoms. For details visit [email or web address]. °  °Also download the MyChart app! Go to the app store, search "MyChart", open the app, select [facility], and log in with your MyChart username and password. ° °Due to Covid, a mask is required upon entering the hospital/clinic. If you do not have a mask, one will be given to you upon arrival. For doctor visits, patients may have 1 support person aged 18 or older with them. For treatment visits, patients cannot have anyone with them due to current Covid guidelines and our immunocompromised population.  °

## 2021-03-24 NOTE — Progress Notes (Signed)
Sheri Jones presents today for injection per the provider's orders.  B12 injection administration without incident; injection site WNL; see MAR for injection details.  Patient tolerated procedure well and without incident.  No questions or complaints noted at this time. Discharged from clinic ambulatory in stable condition. Alert and oriented x 3. F/U with [facility] as scheduled.

## 2021-04-28 ENCOUNTER — Other Ambulatory Visit: Payer: Self-pay

## 2021-04-28 ENCOUNTER — Inpatient Hospital Stay (HOSPITAL_COMMUNITY): Payer: Federal, State, Local not specified - PPO | Attending: Hematology

## 2021-04-28 VITALS — BP 129/73 | HR 77 | Temp 96.6°F | Resp 18

## 2021-04-28 DIAGNOSIS — E538 Deficiency of other specified B group vitamins: Secondary | ICD-10-CM | POA: Diagnosis present

## 2021-04-28 MED ORDER — CYANOCOBALAMIN 1000 MCG/ML IJ SOLN
1000.0000 ug | Freq: Once | INTRAMUSCULAR | Status: AC
  Administered 2021-04-28: 1000 ug via INTRAMUSCULAR
  Filled 2021-04-28: qty 1

## 2021-04-28 NOTE — Progress Notes (Signed)
Sheri Jones presents today for injection per the provider's orders.  B12  administration without incident; injection site WNL; see MAR for injection details.  Patient tolerated procedure well and without incident.  No questions or complaints noted at this time.   B12 given today per MD orders. Tolerated infusion without adverse affects. Vital signs stable. No complaints at this time. Discharged from clinic ambulatory in stable condition. Alert and oriented x 3. F/U with [facility] as scheduled.

## 2021-05-26 ENCOUNTER — Other Ambulatory Visit: Payer: Self-pay

## 2021-05-26 ENCOUNTER — Inpatient Hospital Stay (HOSPITAL_COMMUNITY): Payer: Federal, State, Local not specified - PPO | Attending: Hematology

## 2021-05-26 VITALS — BP 100/62 | HR 72 | Temp 97.5°F | Resp 18

## 2021-05-26 DIAGNOSIS — E538 Deficiency of other specified B group vitamins: Secondary | ICD-10-CM | POA: Insufficient documentation

## 2021-05-26 MED ORDER — CYANOCOBALAMIN 1000 MCG/ML IJ SOLN
1000.0000 ug | Freq: Once | INTRAMUSCULAR | Status: AC
  Administered 2021-05-26: 1000 ug via INTRAMUSCULAR
  Filled 2021-05-26: qty 1

## 2021-05-26 NOTE — Patient Instructions (Signed)
[facility]  Discharge Instructions: ?Thank you for choosing [facility] to provide your oncology and hematology care.  ?If you have a lab appointment with the Cancer Center, please come in thru the Main Entrance and check in at the main information desk. ? ?Wear comfortable clothing and clothing appropriate for easy access to any Portacath or PICC line.  ? ?We strive to give you quality time with your provider. You may need to reschedule your appointment if you arrive late (15 or more minutes).  Arriving late affects you and other patients whose appointments are after yours.  Also, if you miss three or more appointments without notifying the office, you may be dismissed from the clinic at the provider?s discretion.    ?  ?For prescription refill requests, have your pharmacy contact our office and allow 72 hours for refills to be completed.   ? ?Today you received B12 injection ?  ? ? ?BELOW ARE SYMPTOMS THAT SHOULD BE REPORTED IMMEDIATELY: ?*FEVER GREATER THAN 100.4 F (38 ?C) OR HIGHER ?*CHILLS OR SWEATING ?*NAUSEA AND VOMITING THAT IS NOT CONTROLLED WITH YOUR NAUSEA MEDICATION ?*UNUSUAL SHORTNESS OF BREATH ?*UNUSUAL BRUISING OR BLEEDING ?*URINARY PROBLEMS (pain or burning when urinating, or frequent urination) ?*BOWEL PROBLEMS (unusual diarrhea, constipation, pain near the anus) ?TENDERNESS IN MOUTH AND THROAT WITH OR WITHOUT PRESENCE OF ULCERS (sore throat, sores in mouth, or a toothache) ?UNUSUAL RASH, SWELLING OR PAIN  ?UNUSUAL VAGINAL DISCHARGE OR ITCHING  ? ?Items with * indicate a potential emergency and should be followed up as soon as possible or go to the Emergency Department if any problems should occur. ? ?Please show the CHEMOTHERAPY ALERT CARD or IMMUNOTHERAPY ALERT CARD at check-in to the Emergency Department and triage nurse. ? ?Should you have questions after your visit or need to cancel or reschedule your appointment, please contact [facility] [phone]   and follow the prompts.  Office hours are 8:00 a.m. to 4:30 p.m. Monday - Friday. Please note that voicemails left after 4:00 p.m. may not be returned until the following business day.  We are closed weekends and major holidays. You have access to a nurse at all times for urgent questions. Please call the main number to the clinic [phone] and follow the prompts. ? ?For any non-urgent questions, you may also contact your provider using MyChart. We now offer e-Visits for anyone 18 and older to request care online for non-urgent symptoms. For details visit [email or web address]. ?  ?Also download the MyChart app! Go to the app store, search "MyChart", open the app, select [facility], and log in with your MyChart username and password. ? ?Due to Covid, a mask is required upon entering the hospital/clinic. If you do not have a mask, one will be given to you upon arrival. For doctor visits, patients may have 1 support person aged 18 or older with them. For treatment visits, patients cannot have anyone with them due to current Covid guidelines and our immunocompromised population.  ?

## 2021-05-26 NOTE — Progress Notes (Signed)
AHMANI DAOUD presents today for injection per the provider's orders.  B12 administration without incident; injection site WNL; see MAR for injection details.  Patient tolerated procedure well and without incident.  No questions or complaints noted at this time.   Discharged from clinic ambulatory in stable condition. Alert and oriented x 3. F/U with [facility] as scheduled.

## 2021-06-23 ENCOUNTER — Encounter (HOSPITAL_COMMUNITY): Payer: Self-pay

## 2021-06-23 ENCOUNTER — Inpatient Hospital Stay (HOSPITAL_COMMUNITY): Payer: Federal, State, Local not specified - PPO | Attending: Hematology

## 2021-06-23 ENCOUNTER — Other Ambulatory Visit: Payer: Self-pay

## 2021-06-23 ENCOUNTER — Inpatient Hospital Stay (HOSPITAL_COMMUNITY): Payer: Federal, State, Local not specified - PPO

## 2021-06-23 VITALS — BP 129/72 | HR 81 | Temp 98.2°F | Resp 18

## 2021-06-23 DIAGNOSIS — D649 Anemia, unspecified: Secondary | ICD-10-CM | POA: Diagnosis not present

## 2021-06-23 DIAGNOSIS — E538 Deficiency of other specified B group vitamins: Secondary | ICD-10-CM | POA: Insufficient documentation

## 2021-06-23 DIAGNOSIS — Z79899 Other long term (current) drug therapy: Secondary | ICD-10-CM | POA: Diagnosis not present

## 2021-06-23 DIAGNOSIS — K909 Intestinal malabsorption, unspecified: Secondary | ICD-10-CM | POA: Diagnosis not present

## 2021-06-23 DIAGNOSIS — D696 Thrombocytopenia, unspecified: Secondary | ICD-10-CM | POA: Diagnosis not present

## 2021-06-23 DIAGNOSIS — M8580 Other specified disorders of bone density and structure, unspecified site: Secondary | ICD-10-CM | POA: Insufficient documentation

## 2021-06-23 DIAGNOSIS — Z9884 Bariatric surgery status: Secondary | ICD-10-CM | POA: Insufficient documentation

## 2021-06-23 LAB — CBC WITH DIFFERENTIAL/PLATELET
Abs Immature Granulocytes: 0.01 10*3/uL (ref 0.00–0.07)
Basophils Absolute: 0 10*3/uL (ref 0.0–0.1)
Basophils Relative: 1 %
Eosinophils Absolute: 0.1 10*3/uL (ref 0.0–0.5)
Eosinophils Relative: 3 %
HCT: 38.3 % (ref 36.0–46.0)
Hemoglobin: 12.5 g/dL (ref 12.0–15.0)
Immature Granulocytes: 0 %
Lymphocytes Relative: 42 %
Lymphs Abs: 2.1 10*3/uL (ref 0.7–4.0)
MCH: 30.5 pg (ref 26.0–34.0)
MCHC: 32.6 g/dL (ref 30.0–36.0)
MCV: 93.4 fL (ref 80.0–100.0)
Monocytes Absolute: 0.5 10*3/uL (ref 0.1–1.0)
Monocytes Relative: 10 %
Neutro Abs: 2.3 10*3/uL (ref 1.7–7.7)
Neutrophils Relative %: 44 %
Platelets: 181 10*3/uL (ref 150–400)
RBC: 4.1 MIL/uL (ref 3.87–5.11)
RDW: 15 % (ref 11.5–15.5)
WBC: 5 10*3/uL (ref 4.0–10.5)
nRBC: 0 % (ref 0.0–0.2)

## 2021-06-23 LAB — FERRITIN: Ferritin: 81 ng/mL (ref 11–307)

## 2021-06-23 LAB — IRON AND TIBC
Iron: 95 ug/dL (ref 28–170)
Saturation Ratios: 29 % (ref 10.4–31.8)
TIBC: 328 ug/dL (ref 250–450)
UIBC: 233 ug/dL

## 2021-06-23 LAB — VITAMIN B12: Vitamin B-12: 1051 pg/mL — ABNORMAL HIGH (ref 180–914)

## 2021-06-23 MED ORDER — CYANOCOBALAMIN 1000 MCG/ML IJ SOLN
1000.0000 ug | Freq: Once | INTRAMUSCULAR | Status: AC
  Administered 2021-06-23: 1000 ug via INTRAMUSCULAR
  Filled 2021-06-23: qty 1

## 2021-06-23 NOTE — Patient Instructions (Signed)
[place]  Discharge Instructions: ?Thank you for choosing [facility] to provide your oncology and hematology care.  ?If you have a lab appointment with the [facility], please come in thru the Main Entrance and check in at the main information desk. ? ?Wear comfortable clothing and clothing appropriate for easy access to any Portacath or PICC line.  ? ?We strive to give you quality time with your provider. You may need to reschedule your appointment if you arrive late (15 or more minutes).  Arriving late affects you and other patients whose appointments are after yours.  Also, if you miss three or more appointments without notifying the office, you may be dismissed from the clinic at the provider?s discretion.    ?  ?For prescription refill requests, have your pharmacy contact our office and allow 72 hours for refills to be completed.   ? ?Today you received the following B12 injection, return as scheduled. ?  ?To help prevent nausea and vomiting after your treatment, we encourage you to take your nausea medication as directed. ? ?BELOW ARE SYMPTOMS THAT SHOULD BE REPORTED IMMEDIATELY: ?*FEVER GREATER THAN 100.4 F (38 ?C) OR HIGHER ?*CHILLS OR SWEATING ?*NAUSEA AND VOMITING THAT IS NOT CONTROLLED WITH YOUR NAUSEA MEDICATION ?*UNUSUAL SHORTNESS OF BREATH ?*UNUSUAL BRUISING OR BLEEDING ?*URINARY PROBLEMS (pain or burning when urinating, or frequent urination) ?*BOWEL PROBLEMS (unusual diarrhea, constipation, pain near the anus) ?TENDERNESS IN MOUTH AND THROAT WITH OR WITHOUT PRESENCE OF ULCERS (sore throat, sores in mouth, or a toothache) ?UNUSUAL RASH, SWELLING OR PAIN  ?UNUSUAL VAGINAL DISCHARGE OR ITCHING  ? ?Items with * indicate a potential emergency and should be followed up as soon as possible or go to the Emergency Department if any problems should occur. ? ?Please show the CHEMOTHERAPY ALERT CARD or IMMUNOTHERAPY ALERT CARD at check-in to the Emergency Department and triage  nurse. ? ?Should you have questions after your visit or need to cancel or reschedule your appointment, please contact [facility] [phone]  and follow the prompts.  Office hours are 8:00 a.m. to 4:30 p.m. Monday - Friday. Please note that voicemails left after 4:00 p.m. may not be returned until the following business day.  We are closed weekends and major holidays. You have access to a nurse at all times for urgent questions. Please call the main number to the clinic [phone] and follow the prompts. ? ?For any non-urgent questions, you may also contact your provider using MyChart. We now offer e-Visits for anyone 14 and older to request care online for non-urgent symptoms. For details visit [email or web address]. ?  ?Also download the MyChart app! Go to the app store, search "MyChart", open the app, select [facility], and log in with your MyChart username and password. ? ?Due to Covid, a mask is required upon entering the hospital/clinic. If you do not have a mask, one will be given to you upon arrival. For doctor visits, patients may have 1 support person aged 63 or older with them. For treatment visits, patients cannot have anyone with them due to current Covid guidelines and our immunocompromised population.  ?

## 2021-06-23 NOTE — Progress Notes (Signed)
Patient tolerated injection with no complaints voiced. Site clean and dry with no bruising or swelling noted at site. See MAR for details. Band aid applied.  Patient stable during and after injection. VSS with discharge and left in satisfactory condition with no s/s of distress noted.  

## 2021-06-29 ENCOUNTER — Ambulatory Visit (HOSPITAL_COMMUNITY): Payer: Federal, State, Local not specified - PPO | Admitting: Hematology

## 2021-06-29 ENCOUNTER — Other Ambulatory Visit: Payer: Self-pay

## 2021-06-29 ENCOUNTER — Encounter: Payer: Self-pay | Admitting: Family Medicine

## 2021-06-29 ENCOUNTER — Ambulatory Visit: Payer: Federal, State, Local not specified - PPO | Admitting: Family Medicine

## 2021-06-29 VITALS — BP 124/84 | HR 80 | Resp 16 | Ht 63.0 in | Wt 215.0 lb

## 2021-06-29 DIAGNOSIS — I10 Essential (primary) hypertension: Secondary | ICD-10-CM

## 2021-06-29 DIAGNOSIS — E538 Deficiency of other specified B group vitamins: Secondary | ICD-10-CM | POA: Diagnosis not present

## 2021-06-29 DIAGNOSIS — H938X3 Other specified disorders of ear, bilateral: Secondary | ICD-10-CM | POA: Diagnosis not present

## 2021-06-29 MED ORDER — SEMAGLUTIDE-WEIGHT MANAGEMENT 0.25 MG/0.5ML ~~LOC~~ SOAJ: 0.2500 mg | SUBCUTANEOUS | 0 refills | Status: DC

## 2021-06-29 MED ORDER — PANTOPRAZOLE SODIUM 20 MG PO TBEC: 20.0000 mg | DELAYED_RELEASE_TABLET | Freq: Every day | ORAL | 2 refills | Status: DC | PRN

## 2021-06-29 NOTE — Patient Instructions (Signed)
F/U in 7 weeks, re evaluate weight ? ?If unable to get [facility] I will prescribe phentermine ? ?Change eating as we discussed please ? ?It is important that you exercise regularly at least 30 minutes 5 times a week. If you develop chest pain, have severe difficulty breathing, or feel very tired, stop exercising immediately and seek medical attention  ? 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 ?Weight loss goal of 6 pounds ?

## 2021-06-29 NOTE — Progress Notes (Signed)
[place], [place] [address]   CLINIC:  Medical Oncology/Hematology  PCP:  Fayrene Helper, MD [address], [place] [address] [phone]   REASON FOR VISIT:  Follow-up for vitamin B12 deficiency  CURRENT THERAPY: B12 injections monthly  INTERVAL HISTORY:  Ms. Shampine 76 y.o. 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  She is on monthly B12 injections.  No fatigue, fever, chills, shortness of breath, cough, chest pain, nausea, vomiting, abdominal pain.  Denies any signs or symptoms of blood loss.  No current signs or symptoms of blood clots.  She has 100% energy and 100% appetite. She endorses that she is maintaining a stable weight.   REVIEW OF SYSTEMS:  Review of Systems  Constitutional:  Negative for appetite change, chills, diaphoresis, fatigue, fever and unexpected weight change.  HENT:   Negative for lump/mass and nosebleeds.   Eyes:  Negative for eye problems.  Respiratory:  Negative for cough, hemoptysis and shortness of breath.   Cardiovascular:  Negative for chest pain, leg swelling and palpitations.  Gastrointestinal:  Negative for abdominal pain, blood in stool, constipation, diarrhea, nausea and vomiting.  Genitourinary:  Negative for hematuria.   Skin: Negative.   Neurological:  Negative for dizziness, headaches and light-headedness.  Hematological:  Does not bruise/bleed easily.     PAST MEDICAL/SURGICAL HISTORY:  Past Medical History:  Diagnosis Date   Allergic rhinitis due to pollen    Allergy    Phreesia 02/01/2020   Anemia    Arthritis    Phreesia 02/01/2020   Diabetes mellitus without complication ([place])    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   Hypertension    Phreesia 02/01/2020   Obesity, unspecified    Osteoarthrosis, unspecified whether generalized or  localized, lower leg    Unspecified essential hypertension    Vitamin B12 deficiency disease 10/28/2008   Qualifier: Diagnosis of  By: Moshe Cipro MD, Joycelyn Schmid     Past Surgical History:  Procedure Laterality Date   COLONOSCOPY  2008   COLONOSCOPY N/A 09/28/2020   Procedure: COLONOSCOPY;  Surgeon: Daneil Dolin, MD;  Location: [facility];  Service: Endoscopy;  Laterality: N/A;  ASA II / 1:00   EXAM UNDER ANESTHESIA WITH MANIPULATION OF KNEE Right 05/11/2015   Procedure: EXAM UNDER ANESTHESIA WITH MANIPULATION OF KNEE;  Surgeon: Carole Civil, MD;  Location: [facility];  Service: Orthopedics;  Laterality: Right;   EYE SURGERY Left 04/27/2018   cataract   EYE SURGERY Right 06/17/2018   cataract   GASTRIC BYPASS  2004   JOINT REPLACEMENT N/A    Phreesia 02/01/2020   TOTAL KNEE ARTHROPLASTY Right 02/23/2015   Procedure: RIGHT TOTAL KNEE ARTHROPLASTY;  Surgeon: Carole Civil, MD;  Location: [facility];  Service: Orthopedics;  Laterality: Right;   TOTAL KNEE ARTHROPLASTY Left 03/24/2019   Procedure: TOTAL KNEE ARTHROPLASTY;  Surgeon: Carole Civil, MD;  Location: [facility];  Service: Orthopedics;  Laterality: Left;     SOCIAL HISTORY:  Social History   Socioeconomic History   Marital status: Divorced    Spouse name: Not on file   Number of children: 0   Years of education: Not on file   Highest education level: Not on file  Occupational History   Not on file  Tobacco Use   Smoking status: Never   Smokeless tobacco: Never  Vaping  Use   Vaping Use: Never used  Substance and Sexual Activity   Alcohol use: No   Drug use: No   Sexual activity: Not Currently  Other Topics Concern   Not on file  Social History Narrative   Divorced.   Social Determinants of Health   Financial Resource Strain: Low Risk    Difficulty of Paying Living Expenses: Not hard at all  Food Insecurity: No Food Insecurity   Worried About Charity fundraiser in the Last Year: Never true   [place]  in the Last Year: Never true  Transportation Needs: No Transportation Needs   Lack of Transportation (Medical): No   Lack of Transportation (Non-Medical): No  Physical Activity: Sufficiently Active   Days of Exercise per Week: 5 days   Minutes of Exercise per Session: 60 min  Stress: No Stress Concern Present   Feeling of Stress : Not at all  Social Connections: Moderately Integrated   Frequency of Communication with Friends and Family: More than three times a week   Frequency of Social Gatherings with Friends and Family: More than three times a week   Attends Religious Services: 1 to 4 times per year   Active Member of [employer] or Organizations: Yes   Attends Archivist Meetings: 1 to 4 times per year   Marital Status: Divorced  Human resources officer Violence: Not At Risk   Fear of Current or Ex-Partner: No   Emotionally Abused: No   Physically Abused: No   Sexually Abused: No    FAMILY HISTORY:  Family History  Problem Relation Age of Onset   Hypertension Mother    Hypertension Sister    Hypertension Sister    Stroke Sister 55   Hypertension Sister    Heart disease Father 76       massive heart attack    Breast cancer Maternal Aunt    Lung cancer Maternal Aunt     CURRENT MEDICATIONS:  Outpatient Encounter Medications as of 06/30/2021  Medication Sig   acetaminophen (TYLENOL) 325 MG tablet Take 650 mg by mouth every 6 (six) hours as needed for mild pain.   Calcium Carb-Cholecalciferol (CALCIUM 600+D) 600-800 MG-UNIT TABS Take 2 tablets by mouth daily.   Cetirizine HCl 10 MG TBDP Take 1 tablet by mouth daily.   cholecalciferol (VITAMIN D3) 25 MCG (1000 UT) tablet Take 1,000 Units by mouth daily.   cyanocobalamin (,VITAMIN B-12,) 1000 MCG/ML injection Inject 1,000 mcg into the muscle every 30 (thirty) days.   diphenhydrAMINE (BENADRYL) 25 MG tablet Take 25 mg by mouth every 8 (eight) hours as needed for allergies.   lisinopril-hydrochlorothiazide (ZESTORETIC) 20-25 MG  tablet Take 1 tablet by mouth daily.   Multiple Vitamins-Minerals (CENTRUM ULTRA WOMENS PO) Take 1 tablet by mouth daily.   pantoprazole (PROTONIX) 20 MG tablet Take 1 tablet (20 mg total) by mouth daily as needed for heartburn or indigestion.   potassium chloride (KLOR-CON) 10 MEQ tablet TAKE 1 TABLET BY MOUTH 2 TIMES DAILY.   Semaglutide-Weight Management 0.25 MG/0.5ML SOAJ Inject 0.25 mg into the skin once a week.   [DISCONTINUED] temazepam (RESTORIL) 7.5 MG capsule Take 1 capsule (7.5 mg total) by mouth at bedtime as needed for sleep. (Patient not taking: Reported on 05/19/2019)   No facility-administered encounter medications on file as of 06/30/2021.    ALLERGIES:  No Known Allergies   PHYSICAL EXAM:  ECOG PERFORMANCE STATUS: 0 - Asymptomatic  There were no vitals filed for this visit.  There were no vitals filed for this visit. Physical Exam Constitutional:      Appearance: Normal appearance. She is obese.  HENT:     Head: Normocephalic and atraumatic.     Mouth/Throat:     Mouth: Mucous membranes are moist.  Eyes:     Extraocular Movements: Extraocular movements intact.     Pupils: Pupils are equal, round, and reactive to light.  Cardiovascular:     Rate and Rhythm: Normal rate and regular rhythm.     Pulses: Normal pulses.     Heart sounds: Normal heart sounds.  Pulmonary:     Effort: Pulmonary effort is normal.     Breath sounds: Normal breath sounds.  Abdominal:     General: Bowel sounds are normal.     Palpations: Abdomen is soft.     Tenderness: There is no abdominal tenderness.  Musculoskeletal:        General: No swelling.     Right lower leg: No edema.     Left lower leg: No edema.  Lymphadenopathy:     Cervical: No cervical adenopathy.  Skin:    General: Skin is warm and dry.  Neurological:     General: No focal deficit present.     Mental Status: She is alert and oriented to person, place, and time.  Psychiatric:        Mood and Affect: Mood normal.         Behavior: Behavior normal.     LABORATORY DATA:  I have reviewed the labs as listed.  CBC    Component Value Date/Time   WBC 5.0 06/23/2021 0849   RBC 4.10 06/23/2021 0849   HGB 12.5 06/23/2021 0849   HCT 38.3 06/23/2021 0849   PLT 181 06/23/2021 0849   MCV 93.4 06/23/2021 0849   MCH 30.5 06/23/2021 0849   MCHC 32.6 06/23/2021 0849   RDW 15.0 06/23/2021 0849   LYMPHSABS 2.1 06/23/2021 0849   MONOABS 0.5 06/23/2021 0849   EOSABS 0.1 06/23/2021 0849   BASOSABS 0.0 06/23/2021 0849   CMP Latest Ref Rng & Units 02/24/2021 07/28/2020 06/24/2020  Glucose 70 - 99 mg/dL 86 87 91  BUN 8 - 27 mg/dL '23 22 18  '$ Creatinine 0.57 - 1.00 mg/dL 1.01(H) 0.81 0.86  Sodium 134 - 144 mmol/L 142 142 140  Potassium 3.5 - 5.2 mmol/L 4.4 3.9 3.4(L)  Chloride 96 - 106 mmol/L 107(H) 107 106  CO2 20 - 29 mmol/L '23 27 25  '$ Calcium 8.7 - 10.3 mg/dL 9.0 9.1 8.9  Total Protein 6.0 - 8.5 g/dL 6.7 - 7.0  Total Bilirubin 0.0 - 1.2 mg/dL 0.4 - 0.8  Alkaline Phos 44 - 121 IU/L 98 - 78  AST 0 - 40 IU/L 26 - 28  ALT 0 - 32 IU/L 21 - 25    DIAGNOSTIC IMAGING:  I have independently reviewed the relevant imaging and discussed with the patient.  ASSESSMENT & PLAN: 1.  Vitamin B12 deficiency with history of thrombocytopenia/leukopenia (resolved) - Longstanding vitamin B12 deficiency secondary to inability to absorb B12 due to her Roux-en-Y gastric bypass surgery procedure in January 2004 - She was previously seen here at the cancer center for thrombocytopenia and leukopenia, which resolved after vitamin B12 replacement therapy  - She receives monthly B12 injections - She also takes Centrum Silver multivitamin tablet daily - Most recent labs (06/23/2021) with Hgb 12.5/MCV 93.4, ferritin 81, iron saturation 29%, vitamin B12 1051 - PLAN: Patient was initially referred to cancer center  due to leukopenia/thrombocytopenia that was found to be from vitamin B12 deficiency and resolved after vitamin B12 repletion.  She is  continuing to follow here for monthly B12 injections.  Discussed with patient that this may be something that could be followed by her primary care provider rather than continuing to follow at the cancer center.  She is agreeable to this and would like to discharge from clinic if possible. - I will reach out to her PCP (Dr. Tula Nakayama) to see if she will assume responsibility of B12 injections.  If not, we will be happy to continue providing B12 injections and following with this patient. - Next B12 injection on 07/21/2021.  Further plan forthcoming, pending response from PCP.  2.  Osteopenia - DEXA scan on 02/11/2020 with T score -1.7 - She takes vitamin D and calcium supplements - PLAN: Her osteopenia is being managed and monitored by her PCP.   All questions were answered. The patient knows to call the clinic with any problems, questions or concerns.  Medical decision making: Low  Time spent on visit: I spent 10 minutes counseling the patient face to face. The total time spent in the appointment was 15 minutes and more than 50% was on counseling.   Harriett Rush, PA-C  06/30/2021 2:51 PM

## 2021-06-30 ENCOUNTER — Ambulatory Visit (HOSPITAL_COMMUNITY): Payer: Federal, State, Local not specified - PPO | Admitting: Physician Assistant

## 2021-06-30 ENCOUNTER — Encounter: Payer: Self-pay | Admitting: Family Medicine

## 2021-06-30 ENCOUNTER — Inpatient Hospital Stay (HOSPITAL_COMMUNITY): Payer: Federal, State, Local not specified - PPO | Admitting: Physician Assistant

## 2021-06-30 VITALS — BP 115/70 | HR 77 | Temp 97.7°F | Resp 18 | Wt 214.5 lb

## 2021-06-30 DIAGNOSIS — E538 Deficiency of other specified B group vitamins: Secondary | ICD-10-CM | POA: Diagnosis not present

## 2021-06-30 NOTE — Assessment & Plan Note (Signed)
Followed by Hematology, has upcoming visit ?

## 2021-06-30 NOTE — Progress Notes (Signed)
? ?  Sheri Jones     MRN: [id number]      DOB: Nov 23, 1945 ? ? ?HPI ?Ms. Zylka is here for follow up and re-evaluation of chronic medical conditions, medication management and review of any available recent lab and radiology data.  ?Preventive health is updated, specifically  Cancer screening and Immunization.   ?Questions or concerns regarding consultations or procedures which the PT has had in the interim are  addressed. ?The PT denies any adverse reactions to current medications since the last visit.  ?Frustrated with lack of weight loss despite dietary change and exercise , wants medication to help ?C/o hearing heartbeat in both ears ?ROS ?Denies recent fever or chills. ?Denies sinus pressure, nasal congestion, ear pain or sore throat. ?Denies chest congestion, productive cough or wheezing. ?Denies chest pains, palpitations and leg swelling ?Denies abdominal pain, nausea, vomiting,diarrhea or constipation.   ?Denies dysuria, frequency, hesitancy or incontinence. ?Denies joint pain, swelling and limitation in mobility. ?Denies headaches, seizures, numbness, or tingling. ?Denies depression, anxiety or insomnia. ?Denies skin break down or rash. ? ? ?PE ? ?BP 124/84   Pulse 80   Resp 16   Ht '5\' 3"'$  (1.6 m)   Wt 215 lb (97.5 kg)   SpO2 93%   BMI 38.09 kg/m?  ? ?Patient alert and oriented and in no cardiopulmonary distress. ? ?HEENT: No facial asymmetry, EOMI,     Neck supple .Carotid bruit present ? ?Chest: Clear to auscultation bilaterally. ? ?CVS: S1, S2 no murmurs, no S3.Regular rate. ? ?ABD: Soft non tender.  ? ?Ext: No edema ? ?MS: Adequate ROM spine, shoulders, hips and knees. ? ?Skin: Intact, no ulcerations or rash noted. ? ?Psych: Good eye contact, normal affect. Memory intact not anxious or depressed appearing. ? ?CNS: CN 2-12 intact, power,  normal throughout.no focal deficits noted. ? ? ?Assessment & Plan ? ?Morbid obesity ([place]) ? ?Patient re-educated about  the importance of commitment to a  minimum  of 150 minutes of exercise per week as able. ? ?The importance of healthy food choices with portion control discussed, as well as eating regularly and within a 12 hour window most days. ?The need to choose "clean , green" food 50 to 75% of the time is discussed, as well as to make water the primary drink and set a goal of 64 ounces water daily. ? ?  ?Weight /BMI 06/29/2021 03/24/2021 02/15/2021  ?WEIGHT 215 lb 215 lb 13.3 oz 217 lb  ?HEIGHT '5\' 3"'$  '5\' 4"'$  '5\' 3"'$   ?BMI 38.09 kg/m2 37.05 kg/m2 38.44 kg/m2  ? ? ?Unchanged, wegovy prescribed, if unavailable , phentermine to be prescribed ? ?Essential hypertension ?Controlled, no change in medication ?DASH diet and commitment to daily physical activity for a minimum of 30 minutes discussed and encouraged, as a part of hypertension management. ?The importance of attaining a healthy weight is also discussed. ? ?BP/Weight 06/29/2021 06/23/2021 05/26/2021 04/28/2021 03/24/2021 02/24/2021 02/15/2021  ?Systolic BP 638 756 433 295 111 117 -  ?Diastolic BP 84 72 62 73 58 72 -  ?Wt. (Lbs) 215 - - - 215.83 - 217  ?BMI 38.09 - - - 37.05 - 38.44  ? ? ? ? ? ?Audible heartbeat in both ears ?Symptomatic and bruit present, obtain carotid dopppler ? ?Vitamin B12 deficiency disease ?Followed by Hematology, has upcoming visit ? ?

## 2021-06-30 NOTE — Assessment & Plan Note (Signed)
Controlled, no change in medication ?DASH diet and commitment to daily physical activity for a minimum of 30 minutes discussed and encouraged, as a part of hypertension management. ?The importance of attaining a healthy weight is also discussed. ? ?BP/Weight 06/29/2021 06/23/2021 05/26/2021 04/28/2021 03/24/2021 02/24/2021 02/15/2021  ?Systolic BP 893 734 287 681 111 117 -  ?Diastolic BP 84 72 62 73 58 72 -  ?Wt. (Lbs) 215 - - - 215.83 - 217  ?BMI 38.09 - - - 37.05 - 38.44  ? ? ? ? ?

## 2021-06-30 NOTE — Assessment & Plan Note (Signed)
Symptomatic and bruit present, obtain carotid dopppler ?

## 2021-06-30 NOTE — Assessment & Plan Note (Signed)
?  Patient re-educated about  the importance of commitment to a  minimum of 150 minutes of exercise per week as able. ? ?The importance of healthy food choices with portion control discussed, as well as eating regularly and within a 12 hour window most days. ?The need to choose "clean , green" food 50 to 75% of the time is discussed, as well as to make water the primary drink and set a goal of 64 ounces water daily. ? ?  ?Weight /BMI 06/29/2021 03/24/2021 02/15/2021  ?WEIGHT 215 lb 215 lb 13.3 oz 217 lb  ?HEIGHT '5\' 3"'$  '5\' 4"'$  '5\' 3"'$   ?BMI 38.09 kg/m2 37.05 kg/m2 38.44 kg/m2  ? ? ?Unchanged, wegovy prescribed, if unavailable , phentermine to be prescribed ?

## 2021-06-30 NOTE — Patient Instructions (Signed)
[place] at [facility] ?Discharge Instructions ? ?You were seen today by Tarri Abernethy PA-C for your vitamin B12 deficiency.  As we discussed, your vitamin B12 is low due to your previous gastric bypass surgery.  However, this may be something that can be managed by your primary care provider going forward, which would mean that you would not need to come see [place] again.  I will check with your primary care doctor to see if she wants to take over your B12 injections.  You should still plan on coming for your B12 injection on 07/21/2021, and we will let you know at that time whether you should keep coming to see [place] or go see your primary care doctor. ? ? ? ? ?Thank you for choosing [place] at [facility] to provide your oncology and hematology care.  To afford each patient quality time with our provider, please arrive at least 15 minutes before your scheduled appointment time.  ? ?If you have a lab appointment with the [facility] please come in thru the Main Entrance and check in at the main information desk. ? ?You need to re-schedule your appointment should you arrive 10 or more minutes late.  We strive to give you quality time with our providers, and arriving late affects you and other patients whose appointments are after yours.  Also, if you no show three or more times for appointments you may be dismissed from the clinic at the providers discretion.     ?Again, thank you for choosing [facility].  Our hope is that these requests will decrease the amount of time that you wait before being seen by our physicians.       ?_____________________________________________________________ ? ?Should you have questions after your visit to [facility], please contact our office at [phone] and follow the prompts.  Our office hours are 8:00 a.m. and 4:30 p.m. Monday - Friday.  Please note that voicemails left after 4:00 p.m. may not be  returned until the following business day.  We are closed weekends and major holidays.  You do have access to a nurse 24-7, just call the main number to the clinic [phone] and do not press any options, hold on the line and a nurse will answer the phone.   ? ?For prescription refill requests, have your pharmacy contact our office and allow 72 hours.   ? ?Due to Covid, you will need to wear a mask upon entering the hospital. If you do not have a mask, a mask will be given to you at the Main Entrance upon arrival. For doctor visits, patients may have 1 support person age 46 or older with them. For treatment visits, patients can not have anyone with them due to social distancing guidelines and our immunocompromised population.  ? ? ? ?

## 2021-07-04 ENCOUNTER — Telehealth (HOSPITAL_COMMUNITY): Payer: Self-pay | Admitting: Physician Assistant

## 2021-07-04 NOTE — Telephone Encounter (Signed)
I discussed with patient's PCP (Dr. Tula Nakayama), who will assume management of the patient's B12 repletion with monthly B12 injections.  Patient can still come for her last B12 injection on 07/21/2021, but I called and spoke with patient this evening, and she is aware that after this she can follow-up with Dr. Moshe Cipro for any additional injections after that.  Should the patient need our services again in the future, we would be happy to take care of her again. ? ?Tarri Abernethy PA-C ?07/04/2021 5:42 PM ?

## 2021-07-20 ENCOUNTER — Inpatient Hospital Stay (HOSPITAL_COMMUNITY): Payer: Federal, State, Local not specified - PPO

## 2021-07-20 VITALS — BP 127/78 | HR 74 | Temp 97.6°F | Resp 16

## 2021-07-20 DIAGNOSIS — E538 Deficiency of other specified B group vitamins: Secondary | ICD-10-CM | POA: Diagnosis not present

## 2021-07-20 MED ORDER — CYANOCOBALAMIN 1000 MCG/ML IJ SOLN
1000.0000 ug | Freq: Once | INTRAMUSCULAR | Status: AC
  Administered 2021-07-20: 1000 ug via INTRAMUSCULAR

## 2021-07-20 NOTE — Progress Notes (Signed)
Patient was here today for her last B-12 injection by our clinic.  She is going to get her remaining injections by her primary care provider.  Patient remained stable during the injection.  She tolerated it well without incidence.  She was discharged ambulatory and in stable condition.   ? ?No further follow ups needed.   ? ? ?

## 2021-07-21 ENCOUNTER — Ambulatory Visit (HOSPITAL_COMMUNITY): Payer: Federal, State, Local not specified - PPO

## 2021-08-06 ENCOUNTER — Other Ambulatory Visit: Payer: Self-pay | Admitting: Family Medicine

## 2021-08-16 ENCOUNTER — Ambulatory Visit: Payer: Federal, State, Local not specified - PPO | Admitting: Family Medicine

## 2021-08-16 ENCOUNTER — Encounter: Payer: Self-pay | Admitting: Family Medicine

## 2021-08-16 VITALS — BP 126/79 | HR 73 | Ht 63.0 in | Wt 214.0 lb

## 2021-08-16 DIAGNOSIS — E559 Vitamin D deficiency, unspecified: Secondary | ICD-10-CM

## 2021-08-16 DIAGNOSIS — E538 Deficiency of other specified B group vitamins: Secondary | ICD-10-CM

## 2021-08-16 DIAGNOSIS — I10 Essential (primary) hypertension: Secondary | ICD-10-CM | POA: Diagnosis not present

## 2021-08-16 NOTE — Patient Instructions (Addendum)
F/u in June 30, weight and B12 injection ? ?Nurse please follow up on wegovy prescribed for this patient, let's see what we can do for her to get med, she has had bypass surgery and she ha shypertension ? ?It is important that you exercise regularly at least 30 minutes 5 times a week. If you develop chest pain, have severe difficulty breathing, or feel very tired, stop exercising immediately and seek medical attention  ? ? ? ?Think about what you will eat, plan ahead. ?Choose " clean, green, fresh or frozen" over canned, processed or packaged foods which are more sugary, salty and fatty. ?70 to 75% of food eaten should be vegetables and fruit. ?Three meals at set times with snacks allowed between meals, but they must be fruit or vegetables. ?Aim to eat over a 12 hour period , example 7 am to 7 pm, and STOP after  your last meal of the day. ?Drink water,generally about 64 ounces per day, no other drink is as healthy. Fruit juice is best enjoyed in a healthy way, by EATING the fruit. ? ? ?Thanks for choosing [facility], we consider it a privelige to serve you. ? ?

## 2021-08-20 ENCOUNTER — Encounter: Payer: Self-pay | Admitting: Family Medicine

## 2021-08-20 MED ORDER — WEGOVY 0.5 MG/0.5ML ~~LOC~~ SOAJ: 0.5000 mg | SUBCUTANEOUS | 2 refills | Status: DC

## 2021-08-20 NOTE — Assessment & Plan Note (Signed)
Will start monthly injection in office  ?

## 2021-08-20 NOTE — Assessment & Plan Note (Signed)
Controlled, no change in medication ?DASH diet and commitment to daily physical activity for a minimum of 30 minutes discussed and encouraged, as a part of hypertension management. ?The importance of attaining a healthy weight is also discussed. ? ? ?  08/16/2021  ? 11:13 AM 07/20/2021  ? 11:27 AM 06/30/2021  ? 10:20 AM 06/29/2021  ? 11:31 AM 06/29/2021  ? 10:54 AM 06/23/2021  ?  9:05 AM 05/26/2021  ?  9:44 AM  ?BP/Weight  ?Systolic BP 891 694 503 888 139 129 100  ?Diastolic BP 79 78 70 84 84 72 62  ?Wt. (Lbs) 214  214.5  215    ?BMI 37.91 kg/m2  38 kg/m2  38.09 kg/m2    ? ? ? ? ?

## 2021-08-20 NOTE — Assessment & Plan Note (Signed)
Continue daily supplement 

## 2021-08-20 NOTE — Progress Notes (Signed)
? ?SHALITA NOTTE     MRN: [id number]      DOB: [birth year] ? ? ?HPI ?Sheri Jones is here for follow up and re-evaluation of chronic medical conditions, medication management and review of any available recent lab and radiology data.  ?Preventive health is updated, specifically  Cancer screening and Immunization.   ?Questions or concerns regarding consultations or procedures which the PT has had in the interim are  addressed. ?The PT denies any adverse reactions to current medications since the last visit.  ?There are no new concerns.  ?There are no specific complaints  ? ?ROS ?Denies recent fever or chills. ?Denies sinus pressure, nasal congestion, ear pain or sore throat. ?Denies chest congestion, productive cough or wheezing. ?Denies chest pains, palpitations and leg swelling ?Denies abdominal pain, nausea, vomiting,diarrhea or constipation.   ?Denies dysuria, frequency, hesitancy or incontinence. ?Denies joint pain, swelling and limitation in mobility. ?Denies headaches, seizures, numbness, or tingling. ?Denies depression, anxiety or insomnia. ?Denies skin break down or rash. ? ? ?PE ? ?BP 126/79   Pulse 73   Ht '5\' 3"'$  (1.6 m)   Wt 214 lb (97.1 kg)   SpO2 94%   BMI 37.91 kg/m?  ? ?Patient alert and oriented and in no cardiopulmonary distress. ? ?HEENT: No facial asymmetry, EOMI,     Neck supple . ? ?Chest: Clear to auscultation bilaterally. ? ?CVS: S1, S2 no murmurs, no S3.Regular rate. ? ?ABD: Soft non tender.  ? ?Ext: No edema ? ?MS: Adequate  though reduced ROM spine, shoulders, hips and knees. ? ?Skin: Intact, no ulcerations or rash noted. ? ?Psych: Good eye contact, normal affect. Memory intact not anxious or depressed appearing. ? ?CNS: CN 2-12 intact, power,  normal throughout.no focal deficits noted. ? ? ?Assessment & Plan ? ?Essential hypertension ?Controlled, no change in medication ?DASH diet and commitment to daily physical activity for a minimum of 30 minutes discussed and encouraged, as a part of  hypertension management. ?The importance of attaining a healthy weight is also discussed. ? ? ?  08/16/2021  ? 11:13 AM 07/20/2021  ? 11:27 AM 06/30/2021  ? 10:20 AM 06/29/2021  ? 11:31 AM 06/29/2021  ? 10:54 AM 06/23/2021  ?  9:05 AM 05/26/2021  ?  9:44 AM  ?BP/Weight  ?Systolic BP 382 505 397 673 139 129 100  ?Diastolic BP 79 78 70 84 84 72 62  ?Wt. (Lbs) 214  214.5  215    ?BMI 37.91 kg/m2  38 kg/m2  38.09 kg/m2    ? ? ? ? ? ?Vitamin D deficiency ?Continue daily supplement ? ?Vitamin B12 deficiency disease ?Will start monthly injection in office  ? ?Morbid obesity ([place]) ? ?Patient re-educated about  the importance of commitment to a  minimum of 150 minutes of exercise per week as able. ? ?The importance of healthy food choices with portion control discussed, as well as eating regularly and within a 12 hour window most days. ?The need to choose "clean , green" food 50 to 75% of the time is discussed, as well as to make water the primary drink and set a goal of 64 ounces water daily. ? ?  ? ?  08/16/2021  ? 11:13 AM 06/30/2021  ? 10:20 AM 06/29/2021  ? 10:54 AM  ?Weight /BMI  ?Weight 214 lb 214 lb 8 oz 215 lb  ?Height '5\' 3"'$  (1.6 m)  '5\' 3"'$  (1.6 m)  ?BMI 37.91 kg/m2 38 kg/m2 38.09 kg/m2  ? ? ?To start wegovy  weekly ? ?

## 2021-08-20 NOTE — Assessment & Plan Note (Signed)
?  Patient re-educated about  the importance of commitment to a  minimum of 150 minutes of exercise per week as able. ? ?The importance of healthy food choices with portion control discussed, as well as eating regularly and within a 12 hour window most days. ?The need to choose "clean , green" food 50 to 75% of the time is discussed, as well as to make water the primary drink and set a goal of 64 ounces water daily. ? ?  ? ?  08/16/2021  ? 11:13 AM 06/30/2021  ? 10:20 AM 06/29/2021  ? 10:54 AM  ?Weight /BMI  ?Weight 214 lb 214 lb 8 oz 215 lb  ?Height '5\' 3"'$  (1.6 m)  '5\' 3"'$  (1.6 m)  ?BMI 37.91 kg/m2 38 kg/m2 38.09 kg/m2  ? ? ?To start wegovy weekly ?

## 2021-08-21 ENCOUNTER — Ambulatory Visit (INDEPENDENT_AMBULATORY_CARE_PROVIDER_SITE_OTHER): Payer: Federal, State, Local not specified - PPO

## 2021-08-21 ENCOUNTER — Encounter: Payer: Self-pay | Admitting: Family Medicine

## 2021-08-21 DIAGNOSIS — E538 Deficiency of other specified B group vitamins: Secondary | ICD-10-CM

## 2021-08-21 MED ORDER — CYANOCOBALAMIN 1000 MCG/ML IJ SOLN
1000.0000 ug | Freq: Once | INTRAMUSCULAR | Status: AC
  Administered 2021-08-21: 1000 ug via INTRAMUSCULAR

## 2021-09-11 ENCOUNTER — Encounter: Payer: Self-pay | Admitting: Family Medicine

## 2021-09-12 ENCOUNTER — Other Ambulatory Visit: Payer: Self-pay | Admitting: Family Medicine

## 2021-09-22 ENCOUNTER — Ambulatory Visit (INDEPENDENT_AMBULATORY_CARE_PROVIDER_SITE_OTHER): Payer: Federal, State, Local not specified - PPO

## 2021-09-22 DIAGNOSIS — E538 Deficiency of other specified B group vitamins: Secondary | ICD-10-CM

## 2021-09-22 MED ORDER — CYANOCOBALAMIN 1000 MCG/ML IJ SOLN
1000.0000 ug | Freq: Once | INTRAMUSCULAR | Status: AC
  Administered 2021-09-22: 1000 ug via INTRAMUSCULAR

## 2021-09-24 ENCOUNTER — Other Ambulatory Visit: Payer: Self-pay | Admitting: Family Medicine

## 2021-10-05 ENCOUNTER — Ambulatory Visit (INDEPENDENT_AMBULATORY_CARE_PROVIDER_SITE_OTHER): Payer: Federal, State, Local not specified - PPO

## 2021-10-05 ENCOUNTER — Ambulatory Visit: Payer: Federal, State, Local not specified - PPO | Admitting: Orthopedic Surgery

## 2021-10-05 DIAGNOSIS — Z96652 Presence of left artificial knee joint: Secondary | ICD-10-CM

## 2021-10-05 DIAGNOSIS — M1712 Unilateral primary osteoarthritis, left knee: Secondary | ICD-10-CM

## 2021-10-05 DIAGNOSIS — Z96651 Presence of right artificial knee joint: Secondary | ICD-10-CM

## 2021-10-05 DIAGNOSIS — M1711 Unilateral primary osteoarthritis, right knee: Secondary | ICD-10-CM

## 2021-10-05 NOTE — Progress Notes (Signed)
FOLLOW UP   Encounter Diagnoses  Name Primary?   Primary osteoarthritis of left knee Yes   Unilateral primary osteoarthritis, right knee      Chief Complaint  Patient presents with   s/p knee replacement right    DOS 02/23/15   s/p knee replacement left    DOS 03/24/19   Right knee 7 years postop Left knee 3 years postop  Stiff left knee   Annual imaging  Both knees stable the left is a attuen and the rigth is a sigma   Xrays in 1 yr

## 2021-10-20 ENCOUNTER — Encounter: Payer: Self-pay | Admitting: Family Medicine

## 2021-10-20 ENCOUNTER — Ambulatory Visit: Payer: Federal, State, Local not specified - PPO | Admitting: Family Medicine

## 2021-10-20 VITALS — BP 131/78 | HR 88 | Resp 16 | Ht 63.0 in | Wt 208.0 lb

## 2021-10-20 DIAGNOSIS — E538 Deficiency of other specified B group vitamins: Secondary | ICD-10-CM

## 2021-10-20 DIAGNOSIS — I10 Essential (primary) hypertension: Secondary | ICD-10-CM

## 2021-10-20 MED ORDER — SEMAGLUTIDE-WEIGHT MANAGEMENT 0.5 MG/0.5ML ~~LOC~~ SOAJ: 0.5000 mg | SUBCUTANEOUS | 0 refills | Status: DC

## 2021-10-20 MED ORDER — CYANOCOBALAMIN 1000 MCG/ML IJ SOLN
1000.0000 ug | Freq: Once | INTRAMUSCULAR | Status: AC
  Administered 2021-10-20: 1000 ug via INTRAMUSCULAR

## 2021-10-20 NOTE — Patient Instructions (Signed)
F/U in 10 weeks,  re evaluate weight, call if you need me sooner  Congrats on weight loss`, keep it up  Increase dose of wegovy , please let us know if unable to get  Thanks for choosing [facility], we consider it a privelige to serve you.

## 2021-10-24 ENCOUNTER — Encounter: Payer: Self-pay | Admitting: Family Medicine

## 2021-10-24 NOTE — Progress Notes (Signed)
   Sheri Jones     MRN: [id number]      DOB: 06-06-1945   HPI Sheri Jones is here for follow up and re-evaluation of chronic medical conditions, medication management and review of any available recent lab and radiology data.  Preventive health is updated, specifically  Cancer screening and Immunization.   Questions or concerns regarding consultations or procedures which the PT has had in the interim are  addressed. The PT denies any adverse reactions to current medications since the last visit.  There are no new concerns.  There are no specific complaints   ROS Denies recent fever or chills. Denies sinus pressure, nasal congestion, ear pain or sore throat. Denies chest congestion, productive cough or wheezing. Denies chest pains, palpitations and leg swelling Denies abdominal pain, nausea, vomiting,diarrhea or constipation.   Denies dysuria, frequency, hesitancy or incontinence. Denies joint pain, swelling and limitation in mobility. Denies headaches, seizures, numbness, or tingling. Denies depression, anxiety or insomnia. Denies skin break down or rash.   PE  BP 131/78   Pulse 88   Resp 16   Ht '5\' 3"'$  (1.6 m)   Wt 208 lb (94.3 kg)   SpO2 95%   BMI 36.85 kg/m   Patient alert and oriented and in no cardiopulmonary distress.  HEENT: No facial asymmetry, EOMI,     Neck supple .  Chest: Clear to auscultation bilaterally.  CVS: S1, S2 no murmurs, no S3.Regular rate.  ABD: Soft non tender.   Ext: No edema  MS: Adequate ROM spine, shoulders, hips and knees.  Skin: Intact, no ulcerations or rash noted.  Psych: Good eye contact, normal affect. Memory intact not anxious or depressed appearing.  CNS: CN 2-12 intact, power,  normal throughout.no focal deficits noted.   Assessment & Plan  Essential hypertension Controlled, no change in medication DASH diet and commitment to daily physical activity for a minimum of 30 minutes discussed and encouraged, as a part of  hypertension management. The importance of attaining a healthy weight is also discussed.     10/20/2021   10:41 AM 08/16/2021   11:13 AM 07/20/2021   11:27 AM 06/30/2021   10:20 AM 06/29/2021   11:31 AM 06/29/2021   10:54 AM 06/23/2021    9:05 AM  BP/Weight  Systolic BP 315 400 867 619 509 326 712  Diastolic BP 78 79 78 70 84 84 72  Wt. (Lbs) 208 214  214.5  215   BMI 36.85 kg/m2 37.91 kg/m2  38 kg/m2  38.09 kg/m2        Morbid obesity (HCC) Improved, inc dose of wegovy  Patient re-educated about  the importance of commitment to a  minimum of 150 minutes of exercise per week as able.  The importance of healthy food choices with portion control discussed, as well as eating regularly and within a 12 hour window most days. The need to choose "clean , green" food 50 to 75% of the time is discussed, as well as to make water the primary drink and set a goal of 64 ounces water daily.       10/20/2021   10:41 AM 08/16/2021   11:13 AM 06/30/2021   10:20 AM  Weight /BMI  Weight 208 lb 214 lb 214 lb 8 oz  Height '5\' 3"'$  (1.6 m) '5\' 3"'$  (1.6 m)   BMI 36.85 kg/m2 37.91 kg/m2 38 kg/m2      Vitamin B12 deficiency disease 1 cc IM in office as scheduled

## 2021-10-24 NOTE — Assessment & Plan Note (Signed)
Controlled, no change in medication DASH diet and commitment to daily physical activity for a minimum of 30 minutes discussed and encouraged, as a part of hypertension management. The importance of attaining a healthy weight is also discussed.     10/20/2021   10:41 AM 08/16/2021   11:13 AM 07/20/2021   11:27 AM 06/30/2021   10:20 AM 06/29/2021   11:31 AM 06/29/2021   10:54 AM 06/23/2021    9:05 AM  BP/Weight  Systolic BP 341 443 601 658 006 349 494  Diastolic BP 78 79 78 70 84 84 72  Wt. (Lbs) 208 214  214.5  215   BMI 36.85 kg/m2 37.91 kg/m2  38 kg/m2  38.09 kg/m2

## 2021-10-24 NOTE — Assessment & Plan Note (Signed)
Improved, inc dose of wegovy  Patient re-educated about  the importance of commitment to a  minimum of 150 minutes of exercise per week as able.  The importance of healthy food choices with portion control discussed, as well as eating regularly and within a 12 hour window most days. The need to choose "clean , green" food 50 to 75% of the time is discussed, as well as to make water the primary drink and set a goal of 64 ounces water daily.       10/20/2021   10:41 AM 08/16/2021   11:13 AM 06/30/2021   10:20 AM  Weight /BMI  Weight 208 lb 214 lb 214 lb 8 oz  Height '5\' 3"'$  (1.6 m) '5\' 3"'$  (1.6 m)   BMI 36.85 kg/m2 37.91 kg/m2 38 kg/m2

## 2021-10-24 NOTE — Assessment & Plan Note (Signed)
1 cc IM in office as scheduled

## 2021-11-20 ENCOUNTER — Ambulatory Visit (INDEPENDENT_AMBULATORY_CARE_PROVIDER_SITE_OTHER): Payer: Federal, State, Local not specified - PPO

## 2021-11-20 DIAGNOSIS — E538 Deficiency of other specified B group vitamins: Secondary | ICD-10-CM | POA: Diagnosis not present

## 2021-11-20 MED ORDER — CYANOCOBALAMIN 1000 MCG/ML IJ SOLN
1000.0000 ug | Freq: Once | INTRAMUSCULAR | Status: AC
  Administered 2021-11-20: 1000 ug via INTRAMUSCULAR

## 2021-11-27 ENCOUNTER — Encounter: Payer: Self-pay | Admitting: Family Medicine

## 2021-11-27 ENCOUNTER — Ambulatory Visit: Payer: Federal, State, Local not specified - PPO | Admitting: Family Medicine

## 2021-11-27 DIAGNOSIS — M545 Low back pain, unspecified: Secondary | ICD-10-CM | POA: Diagnosis not present

## 2021-11-27 DIAGNOSIS — R399 Unspecified symptoms and signs involving the genitourinary system: Secondary | ICD-10-CM | POA: Diagnosis not present

## 2021-11-27 LAB — POCT URINALYSIS DIP (CLINITEK)
Bilirubin, UA: NEGATIVE
Glucose, UA: NEGATIVE mg/dL
Ketones, POC UA: NEGATIVE mg/dL
Leukocytes, UA: NEGATIVE
Nitrite, UA: NEGATIVE
POC PROTEIN,UA: NEGATIVE
Spec Grav, UA: 1.015 (ref 1.010–1.025)
Urobilinogen, UA: 0.2 E.U./dL
pH, UA: 7 (ref 5.0–8.0)

## 2021-11-27 NOTE — Progress Notes (Unsigned)
Virtual Visit via Telephone Note   This visit type was conducted due to national recommendations for restrictions regarding the COVID-19 Pandemic (e.g. social distancing) in an effort to limit this patient's exposure and mitigate transmission in our community.  Due to her co-morbid illnesses, this patient is at least at moderate risk for complications without adequate follow up.  This format is felt to be most appropriate for this patient at this time.  The patient did not have access to video technology/had technical difficulties with video requiring transitioning to audio format only (telephone).  All issues noted in this document were discussed and addressed.  No physical exam could be performed with this format.  Please refer to the patient's chart for her  consent to telehealth for [facility].   Evaluation Performed:  Follow-up visit  Date:  11/28/2021   ID:  Sheri Jones, DOB 1945-06-18, MRN [id number]  Patient Location: Home Provider Location: Office/Clinic  Participants: Patient Location of Patient: Home Location of Provider: Telehealth Consent was obtain for visit to be over via telehealth. I verified that I am speaking with the correct person using two identifiers.  PCP:  Fayrene Helper, MD   Chief Complaint:    History of Present Illness:    Sheri Jones is a 76 y.o. female with c/o of  low back pain and pain on both sides of the abdomen. Onset of symptoms was 1 week ago.  No medication taken. No recent injury or trauma. Sitting upright and moving aggravates the pain. Sleep at night relieve the pain. Pain is describe as sharp and non-radiating.Denies urgency, frequency and pain with urination. Dennis fever and chills.  The patient does not have symptoms concerning for COVID-19 infection (fever, chills, cough, or new shortness of breath).   Past Medical, Surgical, Social History, Allergies, and Medications have been Reviewed.  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   Hypertension    Phreesia 02/01/2020   Obesity, unspecified    Osteoarthrosis, unspecified whether generalized or localized, lower leg    Unspecified essential hypertension    Vitamin B12 deficiency disease 10/28/2008   Qualifier: Diagnosis of  By: Moshe Cipro MD, Joycelyn Schmid     Past Surgical History:  Procedure Laterality Date   COLONOSCOPY  2008   COLONOSCOPY N/A 09/28/2020   Procedure: COLONOSCOPY;  Surgeon: Daneil Dolin, MD;  Location: [facility];  Service: Endoscopy;  Laterality: N/A;  ASA II / 1:00   EXAM UNDER ANESTHESIA WITH MANIPULATION OF KNEE Right 05/11/2015   Procedure: EXAM UNDER ANESTHESIA WITH MANIPULATION OF KNEE;  Surgeon: Carole Civil, MD;  Location: [facility] ORS;  Service: Orthopedics;  Laterality: Right;   EYE SURGERY Left 04/27/2018   cataract   EYE SURGERY Right 06/17/2018   cataract   GASTRIC BYPASS  2004   JOINT REPLACEMENT N/A    Phreesia 02/01/2020   TOTAL KNEE ARTHROPLASTY Right 02/23/2015   Procedure: RIGHT TOTAL KNEE ARTHROPLASTY;  Surgeon: Carole Civil, MD;  Location: [facility] ORS;  Service: Orthopedics;  Laterality: Right;   TOTAL KNEE ARTHROPLASTY Left 03/24/2019   Procedure: TOTAL KNEE ARTHROPLASTY;  Surgeon: Carole Civil, MD;  Location: [facility] ORS;  Service: Orthopedics;  Laterality: Left;  Current Meds  Medication Sig   acetaminophen (TYLENOL) 325 MG tablet Take 650 mg by mouth every 6 (six) hours as needed for mild pain.   CALCIUM PO Take 2 tablets by mouth daily.   cholecalciferol (VITAMIN D3) 25 MCG (1000 UT) tablet Take 1,000 Units by mouth daily.   cyanocobalamin (,VITAMIN B-12,) 1000 MCG/ML injection Inject 1,000 mcg into the muscle every 30 (thirty) days.   diphenhydrAMINE (BENADRYL) 25 MG tablet Take 25 mg by  mouth every 8 (eight) hours as needed for allergies.   hydrochlorothiazide (HYDRODIURIL) 25 MG tablet TAKE 1 TABLET BY MOUTH EVERY DAY   lisinopril-hydrochlorothiazide (ZESTORETIC) 20-25 MG tablet Take 1 tablet by mouth daily.   Multiple Vitamins-Minerals (CENTRUM ULTRA WOMENS PO) Take 1 tablet by mouth daily.   pantoprazole (PROTONIX) 20 MG tablet TAKE 1 TABLET (20 MG TOTAL) BY MOUTH DAILY AS NEEDED FOR HEARTBURN OR INDIGESTION.   potassium chloride (KLOR-CON) 10 MEQ tablet TAKE 1 TABLET BY MOUTH TWICE A DAY   Semaglutide-Weight Management 0.5 MG/0.5ML SOAJ Inject 0.5 mg into the skin once a week.     Allergies:   Patient has no known allergies.   ROS:   Please see the history of present illness.     All other systems reviewed and are negative.   Labs/Other Tests and Data Reviewed:    Recent Labs: 02/24/2021: ALT 21; BUN 23; Creatinine, Ser 1.01; Potassium 4.4; Sodium 142; TSH 0.866 06/23/2021: Hemoglobin 12.5; Platelets 181   Recent Lipid Panel Lab Results  Component Value Date/Time   CHOL 190 02/24/2021 10:14 AM   TRIG 76 02/24/2021 10:14 AM   HDL 76 02/24/2021 10:14 AM   CHOLHDL 2.5 02/24/2021 10:14 AM   CHOLHDL 2.6 01/28/2020 10:02 AM   LDLCALC 100 (H) 02/24/2021 10:14 AM   LDLCALC 105 (H) 01/28/2020 10:02 AM    Wt Readings from Last 3 Encounters:  10/20/21 208 lb (94.3 kg)  08/16/21 214 lb (97.1 kg)  06/30/21 214 lb 8 oz (97.3 kg)     Objective:    Vital Signs:  There were no vitals taken for this visit.     ASSESSMENT & PLAN:   Low back pain UA is negative for leuk and nitrates Encouraged conservative measures and supportive treatments for low back pain  Time:   Today, I have spent 8 minutes reviewing the chart, including problem list, medications, and with the patient with telehealth technology discussing the above problems.   Medication Adjustments/Labs and Tests Ordered: Current medicines are reviewed at length with the patient today.  Concerns  regarding medicines are outlined above.   Tests Ordered: Orders Placed This Encounter  Procedures   POCT URINALYSIS DIP (CLINITEK)    Medication Changes: No orders of the defined types were placed in this encounter.    Note: This dictation was prepared with Dragon dictation along with smaller phrase technology. Similar sounding words can be transcribed inadequately or may not be corrected upon review. Any transcriptional errors that result from this process are unintentional.      Disposition:  Follow up  Signed, Alvira Monday, FNP  11/28/2021 4:17 AM     [place]

## 2021-12-20 ENCOUNTER — Ambulatory Visit (INDEPENDENT_AMBULATORY_CARE_PROVIDER_SITE_OTHER): Payer: Federal, State, Local not specified - PPO

## 2021-12-20 DIAGNOSIS — E538 Deficiency of other specified B group vitamins: Secondary | ICD-10-CM

## 2021-12-20 MED ORDER — CYANOCOBALAMIN 1000 MCG/ML IJ SOLN
1000.0000 ug | Freq: Once | INTRAMUSCULAR | Status: AC
  Administered 2021-12-20: 1000 ug via INTRAMUSCULAR

## 2021-12-28 ENCOUNTER — Telehealth: Payer: Self-pay

## 2021-12-28 NOTE — Telephone Encounter (Signed)
Patient returning lab result call

## 2022-01-02 ENCOUNTER — Ambulatory Visit: Payer: Federal, State, Local not specified - PPO | Admitting: Family Medicine

## 2022-01-02 ENCOUNTER — Encounter: Payer: Self-pay | Admitting: Family Medicine

## 2022-01-02 VITALS — BP 121/78 | HR 73 | Ht 63.0 in | Wt 210.0 lb

## 2022-01-02 DIAGNOSIS — I10 Essential (primary) hypertension: Secondary | ICD-10-CM | POA: Diagnosis not present

## 2022-01-02 DIAGNOSIS — E559 Vitamin D deficiency, unspecified: Secondary | ICD-10-CM

## 2022-01-02 DIAGNOSIS — Z13220 Encounter for screening for lipoid disorders: Secondary | ICD-10-CM

## 2022-01-02 DIAGNOSIS — Z1231 Encounter for screening mammogram for malignant neoplasm of breast: Secondary | ICD-10-CM | POA: Diagnosis not present

## 2022-01-02 DIAGNOSIS — Z23 Encounter for immunization: Secondary | ICD-10-CM | POA: Diagnosis not present

## 2022-01-02 MED ORDER — SEMAGLUTIDE-WEIGHT MANAGEMENT 1 MG/0.5ML ~~LOC~~ SOAJ: 1.0000 mg | SUBCUTANEOUS | 1 refills | Status: DC

## 2022-01-02 NOTE — Progress Notes (Signed)
Sheri Jones     MRN: [id number]      DOB: 07-02-1945   HPI Sheri Jones is here for follow up and re-evaluation of chronic medical conditions, in particular mor bid obesity.medication management and review of any available recent lab and radiology data.  Preventive health is updated, specifically  Cancer screening and Immunization.   Questions or concerns regarding consultations or procedures which the PT has had in the interim are  addressed. The PT unfortunately has been unable to get her wehgovy for past 2 months, she remains invested in regular exercise and monitoring food intake.  There are no new concerns.  There are no specific complaints   ROS Denies recent fever or chills. Denies sinus pressure, nasal congestion, ear pain or sore throat. Denies chest congestion, productive cough or wheezing. Denies chest pains, palpitations and leg swelling Denies abdominal pain, nausea, vomiting,diarrhea or constipation.   Denies dysuria, frequency, hesitancy or incontinence. Denies joint pain, swelling and limitation in mobility. Denies headaches, seizures, numbness, or tingling. Denies depression, anxiety or insomnia. Denies skin break down or rash.   PE  BP 121/78 (BP Location: Right Arm, Patient Position: Sitting, Cuff Size: Large)   Pulse 73   Ht '5\' 3"'$  (1.6 m)   Wt 210 lb (95.3 kg)   SpO2 96%   BMI 37.20 kg/m   Patient alert and oriented and in no cardiopulmonary distress.  HEENT: No facial asymmetry, EOMI,     Neck supple .  Chest: Clear to auscultation bilaterally.  CVS: S1, S2 no murmurs, no S3.Regular rate.  ABD: Soft non tender.   Ext: No edema  MS: Adequate ROM spine, shoulders, hips and knees.  Skin: Intact, no ulcerations or rash noted.  Psych: Good eye contact, normal affect. Memory intact not anxious or depressed appearing.  CNS: CN 2-12 intact, power,  normal throughout.no focal deficits noted.   Assessment & Plan  Essential  hypertension Controlled, no change in medication DASH diet and commitment to daily physical activity for a minimum of 30 minutes discussed and encouraged, as a part of hypertension management. The importance of attaining a healthy weight is also discussed.     01/02/2022   11:26 AM 10/20/2021   10:41 AM 08/16/2021   11:13 AM 07/20/2021   11:27 AM 06/30/2021   10:20 AM 06/29/2021   11:31 AM 06/29/2021   10:54 AM  BP/Weight  Systolic BP 409 811 914 782 956 213 086  Diastolic BP 78 78 79 78 70 84 84  Wt. (Lbs) 210 208 214  214.5  215  BMI 37.2 kg/m2 36.85 kg/m2 37.91 kg/m2  38 kg/m2  38.09 kg/m2       Morbid obesity (HCC)  Patient re-educated about  the importance of commitment to a  minimum of 150 minutes of exercise per week as able.  The importance of healthy food choices with portion control discussed, as well as eating regularly and within a 12 hour window most days. The need to choose "clean , green" food 50 to 75% of the time is discussed, as well as to make water the primary drink and set a goal of 64 ounces water daily.       01/02/2022   11:26 AM 10/20/2021   10:41 AM 08/16/2021   11:13 AM  Weight /BMI  Weight 210 lb 208 lb 214 lb  Height '5\' 3"'$  (1.6 m) '5\' 3"'$  (1.6 m) '5\' 3"'$  (1.6 m)  BMI 37.2 kg/m2 36.85 kg/m2 37.91 kg/m2  no  change inc wegovy dose

## 2022-01-02 NOTE — Assessment & Plan Note (Signed)
  Patient re-educated about  the importance of commitment to a  minimum of 150 minutes of exercise per week as able.  The importance of healthy food choices with portion control discussed, as well as eating regularly and within a 12 hour window most days. The need to choose "clean , green" food 50 to 75% of the time is discussed, as well as to make water the primary drink and set a goal of 64 ounces water daily.       01/02/2022   11:26 AM 10/20/2021   10:41 AM 08/16/2021   11:13 AM  Weight /BMI  Weight 210 lb 208 lb 214 lb  Height '5\' 3"'$  (1.6 m) '5\' 3"'$  (1.6 m) '5\' 3"'$  (1.6 m)  BMI 37.2 kg/m2 36.85 kg/m2 37.91 kg/m2  no change inc wegovy dose

## 2022-01-02 NOTE — Assessment & Plan Note (Signed)
Controlled, no change in medication DASH diet and commitment to daily physical activity for a minimum of 30 minutes discussed and encouraged, as a part of hypertension management. The importance of attaining a healthy weight is also discussed.     01/02/2022   11:26 AM 10/20/2021   10:41 AM 08/16/2021   11:13 AM 07/20/2021   11:27 AM 06/30/2021   10:20 AM 06/29/2021   11:31 AM 06/29/2021   10:54 AM  BP/Weight  Systolic BP 540 086 761 950 932 671 245  Diastolic BP 78 78 79 78 70 84 84  Wt. (Lbs) 210 208 214  214.5  215  BMI 37.2 kg/m2 36.85 kg/m2 37.91 kg/m2  38 kg/m2  38.09 kg/m2

## 2022-01-02 NOTE — Patient Instructions (Addendum)
Keep October annual  exam as before, call if you need em sooner  Flu vaccine today  New higher dose of wegovy, 1 mg weekly  Fasting CBC, lipid, cmp and EGFR, TSH and vit D,5 to 7  days before October visit  Please schedule mammogram at checkout, try to get it on Sept 29 if possible as she ahs appt in office that day  It is important that you exercise regularly at least 30 minutes 5 times a week. If you develop chest pain, have severe difficulty breathing, or feel very tired, stop exercising immediately and seek medical attention   Thanks for choosing [facility], we consider it a privelige to serve you.

## 2022-01-11 ENCOUNTER — Other Ambulatory Visit: Payer: Self-pay | Admitting: Family Medicine

## 2022-01-19 ENCOUNTER — Ambulatory Visit (INDEPENDENT_AMBULATORY_CARE_PROVIDER_SITE_OTHER): Payer: Federal, State, Local not specified - PPO

## 2022-01-19 DIAGNOSIS — E538 Deficiency of other specified B group vitamins: Secondary | ICD-10-CM

## 2022-01-19 MED ORDER — CYANOCOBALAMIN 1000 MCG/ML IJ SOLN
1000.0000 ug | Freq: Once | INTRAMUSCULAR | Status: AC
  Administered 2022-01-19: 1000 ug via INTRAMUSCULAR

## 2022-01-22 ENCOUNTER — Ambulatory Visit (HOSPITAL_COMMUNITY)
Admission: RE | Admit: 2022-01-22 | Discharge: 2022-01-22 | Disposition: A | Discharge status: Home / Self Care | Payer: Federal, State, Local not specified - PPO | Source: Ambulatory Visit | Attending: Family Medicine | Admitting: Family Medicine

## 2022-01-22 DIAGNOSIS — Z1231 Encounter for screening mammogram for malignant neoplasm of breast: Secondary | ICD-10-CM | POA: Diagnosis present

## 2022-01-31 ENCOUNTER — Other Ambulatory Visit: Payer: Self-pay | Admitting: Family Medicine

## 2022-02-13 LAB — CBC
Hematocrit: 37.1 % (ref 34.0–46.6)
Hemoglobin: 12.3 g/dL (ref 11.1–15.9)
MCH: 31.1 pg (ref 26.6–33.0)
MCHC: 33.2 g/dL (ref 31.5–35.7)
MCV: 94 fL (ref 79–97)
Platelets: 191 10*3/uL (ref 150–450)
RBC: 3.95 x10E6/uL (ref 3.77–5.28)
RDW: 13.7 % (ref 11.7–15.4)
WBC: 4.4 10*3/uL (ref 3.4–10.8)

## 2022-02-13 LAB — CMP14+EGFR
ALT: 20 IU/L (ref 0–32)
AST: 25 IU/L (ref 0–40)
Albumin/Globulin Ratio: 1.6 (ref 1.2–2.2)
Albumin: 3.9 g/dL (ref 3.8–4.8)
Alkaline Phosphatase: 93 IU/L (ref 44–121)
BUN/Creatinine Ratio: 18 (ref 12–28)
BUN: 17 mg/dL (ref 8–27)
Bilirubin Total: 0.4 mg/dL (ref 0.0–1.2)
CO2: 24 mmol/L (ref 20–29)
Calcium: 8.9 mg/dL (ref 8.7–10.3)
Chloride: 108 mmol/L — ABNORMAL HIGH (ref 96–106)
Creatinine, Ser: 0.93 mg/dL (ref 0.57–1.00)
Globulin, Total: 2.5 g/dL (ref 1.5–4.5)
Glucose: 86 mg/dL (ref 70–99)
Potassium: 3.6 mmol/L (ref 3.5–5.2)
Sodium: 147 mmol/L — ABNORMAL HIGH (ref 134–144)
Total Protein: 6.4 g/dL (ref 6.0–8.5)
eGFR: 64 mL/min/{1.73_m2} (ref >59)

## 2022-02-13 LAB — LIPID PANEL
Chol/HDL Ratio: 2.3 ratio (ref 0.0–4.4)
Cholesterol, Total: 177 mg/dL (ref 100–199)
HDL: 77 mg/dL (ref >39)
LDL Chol Calc (NIH): 88 mg/dL (ref 0–99)
Triglycerides: 64 mg/dL (ref 0–149)
VLDL Cholesterol Cal: 12 mg/dL (ref 5–40)

## 2022-02-13 LAB — TSH: TSH: 0.956 u[IU]/mL (ref 0.450–4.500)

## 2022-02-13 LAB — VITAMIN D 25 HYDROXY (VIT D DEFICIENCY, FRACTURES): Vit D, 25-Hydroxy: 42.4 ng/mL (ref 30.0–100.0)

## 2022-02-16 ENCOUNTER — Encounter: Payer: Self-pay | Admitting: Family Medicine

## 2022-02-16 ENCOUNTER — Ambulatory Visit (INDEPENDENT_AMBULATORY_CARE_PROVIDER_SITE_OTHER): Payer: Federal, State, Local not specified - PPO | Admitting: Family Medicine

## 2022-02-16 VITALS — BP 120/78 | HR 74 | Ht 63.0 in | Wt 209.1 lb

## 2022-02-16 DIAGNOSIS — E538 Deficiency of other specified B group vitamins: Secondary | ICD-10-CM | POA: Diagnosis not present

## 2022-02-16 DIAGNOSIS — Z0001 Encounter for general adult medical examination with abnormal findings: Secondary | ICD-10-CM | POA: Diagnosis not present

## 2022-02-16 MED ORDER — CYANOCOBALAMIN 1000 MCG/ML IJ SOLN
1000.0000 ug | Freq: Once | INTRAMUSCULAR | Status: AC
  Administered 2022-02-16: 1000 ug via INTRAMUSCULAR

## 2022-02-16 NOTE — Patient Instructions (Signed)
F/U in  4 months, call if you need me sooner  Please schedule nurse visit for B12  in 30 days at checkout   B12 ,injection in office today  We will see if we can get any medication for weight management and get back to you  Thanks for choosing [facility], we consider it a privelige to serve you.

## 2022-02-17 ENCOUNTER — Encounter: Payer: Self-pay | Admitting: Family Medicine

## 2022-02-17 DIAGNOSIS — Z0001 Encounter for general adult medical examination with abnormal findings: Secondary | ICD-10-CM | POA: Insufficient documentation

## 2022-02-17 NOTE — Progress Notes (Signed)
    Sheri Jones     MRN: [id number]      DOB: Mar 24, 1946  HPI: Patient is in for annual physical exam. Monthly B12 is administered. Recent labs,  are reviewed. Immunization is reviewed ,   PE: BP 120/78 (BP Location: Right Arm, Patient Position: Sitting, Cuff Size: Large)   Pulse 74   Ht '5\' 3"'$  (1.6 m)   Wt 209 lb 1.9 oz (94.9 kg)   SpO2 96%   BMI 37.04 kg/m   Pleasant  female, alert and oriented x 3, in no cardio-pulmonary distress. Afebrile. HEENT No facial trauma or asymetry. Sinuses non tender.  Extra occullar muscles intact.. External ears normal, . Neck: supple, no adenopathy,JVD or thyromegaly.No bruits.  Chest: Clear to ascultation bilaterally.No crackles or wheezes. Non tender to palpation  No axillary or supraclavicular adenopathy  Cardiovascular system; Heart sounds normal,  S1 and  S2 ,no S3.  No murmur, or thrill. Apical beat not displaced Peripheral pulses normal.  Abdomen: Soft, non tender, no organomegaly or masses. No bruits. Bowel sounds normal. No guarding, tenderness or rebound.      Musculoskeletal exam: Decreased ROM of spine, hips , shoulders and knees. deformity ,swelling and crepitus noted. No muscle wasting or atrophy.   Neurologic: Cranial nerves 2 to 12 intact. Power, tone ,sensation normal throughout. No disturbance in gait. No tremor.  Skin: Intact, no ulceration, erythema , scaling or rash noted. Pigmentation normal throughout  Psych; Normal mood and affect. Judgement and concentration normal   Assessment & Plan:  Annual visit for general adult medical examination with abnormal findings Annual exam as documented. Counseling done  re healthy lifestyle involving commitment to 150 minutes exercise per week, heart healthy diet, and attaining healthy weight.The importance of adequate sleep also discussed. Regular seat belt use and home safety, is also discussed. Changes in health habits are decided on by the patient  with goals and time frames  set for achieving them. Immunization and cancer screening needs are specifically addressed at this visit.   Vitamin B12 deficiency disease 1cc B12 administered IM per monthly schedule

## 2022-02-17 NOTE — Assessment & Plan Note (Signed)
1cc B12 administered IM per monthly schedule

## 2022-02-17 NOTE — Assessment & Plan Note (Signed)

## 2022-02-23 ENCOUNTER — Ambulatory Visit: Payer: Federal, State, Local not specified - PPO

## 2022-02-27 ENCOUNTER — Ambulatory Visit (INDEPENDENT_AMBULATORY_CARE_PROVIDER_SITE_OTHER): Payer: Federal, State, Local not specified - PPO | Admitting: Internal Medicine

## 2022-02-27 ENCOUNTER — Encounter: Payer: Self-pay | Admitting: Internal Medicine

## 2022-02-27 DIAGNOSIS — Z0000 Encounter for general adult medical examination without abnormal findings: Secondary | ICD-10-CM | POA: Diagnosis not present

## 2022-02-27 NOTE — Progress Notes (Signed)
Subjective:  This is a telephone encounter between Sheri Jones and Sheri Jones on 02/27/2022 for AWV. The visit was conducted with the patient located at home and Sheri Jones at [facility]. The patient's identity was confirmed using their DOB and current address. The patient has consented to being evaluated through a telephone encounter and understands the associated risks (an examination cannot be done and the patient may need to come in for an appointment) / benefits (allows the patient to remain at home, decreasing exposure to coronavirus).     Sheri Jones is a 76 y.o. female who presents for Medicare Annual (Subsequent) preventive examination.  Review of Systems    Review of Systems  All other systems reviewed and are negative.   Objective:    There were no vitals filed for this visit. There is no height or weight on file to calculate BMI.     02/27/2022   11:01 AM 07/20/2021   11:25 AM 06/30/2021   10:19 AM 06/23/2021    9:04 AM 05/26/2021   10:48 AM 04/28/2021    9:41 AM 03/24/2021    9:50 AM  Advanced Directives  Does Patient Have a Medical Advance Directive? Yes Yes Yes Yes Yes Yes Yes  Type of Corporate treasurer of [place];Living will Living will;Healthcare Power of Attorney Living will;Healthcare Power of Attorney Living will;Healthcare Power of [place];Living will  Does patient want to make changes to medical advance directive?   No - Patient declined No - Patient declined No - Patient declined No - Patient declined No - Patient declined  Copy of [place] in Chart?  No - copy requested No - copy requested No - copy requested   No - copy requested  Would patient like information on creating a medical advance directive?  No - Patient declined No - Patient declined No - Patient declined No - Patient declined No - Patient declined No - Patient declined    Current Medications  (verified) Outpatient Encounter Medications as of 02/27/2022  Medication Sig   acetaminophen (TYLENOL) 325 MG tablet Take 650 mg by mouth every 6 (six) hours as needed for mild pain.   CALCIUM PO Take 2 tablets by mouth daily.   cholecalciferol (VITAMIN D3) 25 MCG (1000 UT) tablet Take 1,000 Units by mouth daily.   cyanocobalamin (,VITAMIN B-12,) 1000 MCG/ML injection Inject 1,000 mcg into the muscle every 30 (thirty) days.   diphenhydrAMINE (BENADRYL) 25 MG tablet Take 25 mg by mouth every 8 (eight) hours as needed for allergies.   lisinopril-hydrochlorothiazide (ZESTORETIC) 20-25 MG tablet TAKE 1 TABLET BY MOUTH EVERY DAY   Multiple Vitamins-Minerals (CENTRUM ULTRA WOMENS PO) Take 1 tablet by mouth daily.   pantoprazole (PROTONIX) 20 MG tablet TAKE 1 TABLET (20 MG TOTAL) BY MOUTH DAILY AS NEEDED FOR HEARTBURN OR INDIGESTION.   potassium chloride (KLOR-CON) 10 MEQ tablet TAKE 1 TABLET BY MOUTH TWICE A DAY   Semaglutide-Weight Management 1 MG/0.5ML SOAJ Inject 1 mg into the skin once a week. (Patient not taking: Reported on 02/16/2022)   [DISCONTINUED] temazepam (RESTORIL) 7.5 MG capsule Take 1 capsule (7.5 mg total) by mouth at bedtime as needed for sleep. (Patient not taking: Reported on 05/19/2019)   No facility-administered encounter medications on file as of 02/27/2022.    Allergies (verified) Patient has no known allergies.   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   Hypertension    Phreesia 02/01/2020   Obesity, unspecified    Osteoarthrosis, unspecified whether generalized or localized, lower leg    Unspecified essential hypertension    Vitamin B12 deficiency disease 10/28/2008   Qualifier: Diagnosis of  By: Moshe Cipro MD, Joycelyn Schmid     Past Surgical History:   Procedure Laterality Date   COLONOSCOPY  2008   COLONOSCOPY N/A 09/28/2020   Procedure: COLONOSCOPY;  Surgeon: Daneil Dolin, MD;  Location: [facility];  Service: Endoscopy;  Laterality: N/A;  ASA II / 1:00   EXAM UNDER ANESTHESIA WITH MANIPULATION OF KNEE Right 05/11/2015   Procedure: EXAM UNDER ANESTHESIA WITH MANIPULATION OF KNEE;  Surgeon: Carole Civil, MD;  Location: [facility] ORS;  Service: Orthopedics;  Laterality: Right;   EYE SURGERY Left 04/27/2018   cataract   EYE SURGERY Right 06/17/2018   cataract   GASTRIC BYPASS  2004   JOINT REPLACEMENT N/A    Phreesia 02/01/2020   TOTAL KNEE ARTHROPLASTY Right 02/23/2015   Procedure: RIGHT TOTAL KNEE ARTHROPLASTY;  Surgeon: Carole Civil, MD;  Location: [facility];  Service: Orthopedics;  Laterality: Right;   TOTAL KNEE ARTHROPLASTY Left 03/24/2019   Procedure: TOTAL KNEE ARTHROPLASTY;  Surgeon: Carole Civil, MD;  Location: [facility];  Service: Orthopedics;  Laterality: Left;   Family History  Problem Relation Age of Onset   Hypertension Mother    Hypertension Sister    Hypertension Sister    Stroke Sister 44   Hypertension Sister    Heart disease Father 15       massive heart attack    Breast cancer Maternal Aunt    Lung cancer Maternal Aunt    Social History   Socioeconomic History   Marital status: Divorced    Spouse name: Not on file   Number of children: 0   Years of education: Not on file   Highest education level: Not on file  Occupational History   Not on file  Tobacco Use   Smoking status: Never   Smokeless tobacco: Never  Vaping Use   Vaping Use: Never used  Substance and Sexual Activity   Alcohol use: No   Drug use: No   Sexual activity: Not Currently  Other Topics Concern   Not on file  Social History Narrative   Divorced.   Social Determinants of Health   Financial Resource Strain: Low Risk  (02/15/2021)   Overall Financial Resource Strain (CARDIA)    Difficulty of Paying Living Expenses:  Not hard at all  Food Insecurity: No Food Insecurity (02/15/2021)   Hunger Vital Sign    Worried About Running Out of Food in the Last Year: Never true    Ran Out of Food in the Last Year: Never true  Transportation Needs: No Transportation Needs (02/15/2021)   PRAPARE - Hydrologist (Medical): No    Lack of Transportation (Non-Medical): No  Physical Activity: Sufficiently Active (02/15/2021)   Exercise Vital Sign    Days of Exercise per Week: 5 days    Minutes of Exercise per Session: 60 min  Stress: No Stress Concern Present (02/15/2021)   [place]    Feeling of Stress : Not at all  Social Connections: Moderately  Integrated (02/15/2021)   Social Connection and Isolation Panel [NHANES]    Frequency of Communication with Friends and Family: More than three times a week    Frequency of Social Gatherings with Friends and Family: More than three times a week    Attends Religious Services: 1 to 4 times per year    Active Member of [employer] or Organizations: Yes    Attends Archivist Meetings: 1 to 4 times per year    Marital Status: Divorced    Tobacco Counseling Counseling given: Not Answered   Clinical Intake:  Pre-visit preparation completed: Yes  Pain : No/denies pain     Diabetes: Yes CBG done?: No Did pt. bring in CBG monitor from home?: No  How often do you need to have someone help you when you read instructions, pamphlets, or other written materials from your doctor or pharmacy?: 1 - Never What is the last grade level you completed in school?: 12th grade  Diabetic?Yes         Activities of Daily Living    02/27/2022   11:04 AM  In your present state of health, do you have any difficulty performing the following activities:  Hearing? 0  Vision? 0  Difficulty concentrating or making decisions? 0  Walking or climbing stairs? 0  Dressing or bathing? 0   Doing errands, shopping? 0    Patient Care Team: Fayrene Helper, MD as PCP - General (Family Medicine) Sheldon Silvan, Manon Hilding, PA-C (Inactive) as Physician Assistant (Physician Assistant) Carole Civil, MD as Consulting Physician (Orthopedic Surgery) Gala Romney Cristopher Estimable, MD as Consulting Physician (Gastroenterology)  Indicate any recent Medical Services you may have received from other than [facility] providers in the past year (date may be approximate).     Assessment:   This is a routine wellness examination for [place].  Hearing/Vision screen No results found.  Dietary issues and exercise activities discussed:     Goals Addressed   None    Depression Screen    02/27/2022   11:04 AM 02/16/2022   11:01 AM 01/02/2022   11:27 AM 11/27/2021    1:18 PM 10/20/2021   10:45 AM 08/16/2021   11:14 AM 06/29/2021   10:55 AM  PHQ 2/9 Scores  PHQ - 2 Score 0 0 0 0 0 0 0    Fall Risk    02/27/2022   11:04 AM 02/16/2022   11:01 AM 01/02/2022   11:26 AM 11/27/2021    1:18 PM 10/20/2021   10:45 AM  Fall Risk   Falls in the past year? 0 0 0 0 0  Number falls in past yr: 0 0 0 0 0  Injury with Fall? 0 0 0 0 0  Risk for fall due to :  No Fall Risks No Fall Risks No Fall Risks No Fall Risks  Follow up  Falls evaluation completed Falls evaluation completed Falls evaluation completed     [place]:  Any stairs in or around the home? No  If so, are there any without handrails? No  Home free of loose throw rugs in walkways, pet beds, electrical cords, etc? Yes  Adequate lighting in your home to reduce risk of falls? Yes   ASSISTIVE DEVICES UTILIZED TO PREVENT FALLS:  Life alert? No  Use of a cane, walker or w/c? No  Grab bars in the bathroom? Yes  Shower chair or bench in shower? Yes  Elevated toilet seat or a handicapped toilet? Yes  Cognitive Function:        02/27/2022   11:04 AM 02/15/2021   10:00 AM  6CIT Screen  What Year? 0 points 0  points  What month? 0 points 0 points  What time? 0 points 0 points  Count back from 20  0 points  Months in reverse  4 points  Repeat phrase  0 points  Total Score  4 points    Immunizations Immunization History  Administered Date(s) Administered   Fluad Quad(high Dose 65+) 02/17/2019, 02/04/2020, 01/13/2021, 01/02/2022   Influenza Split 01/11/2012   Influenza Whole 01/19/2009, 12/29/2010   Influenza, High Dose Seasonal PF 02/20/2018   Influenza,inj,Quad PF,6+ Mos 01/14/2013, 12/31/2013, 12/20/2014, 01/05/2016, 01/02/2017   Moderna Covid-19 Vaccine Bivalent Booster 49yr & up 02/22/2021, 08/22/2021, 01/26/2022   Moderna Sars-Covid-2 Vaccination 05/19/2019, 06/15/2019, 02/25/2020, 09/05/2020   Pneumococcal Conjugate-13 11/24/2013   Pneumococcal Polysaccharide-23 03/16/2013   Tdap 08/16/2010, 02/16/2021   Zoster Recombinat (Shingrix) 11/11/2017, 01/08/2018   Zoster, Live 03/04/2008    TDAP status: Up to date  Flu Vaccine status: Up to date  Pneumococcal vaccine status: Up to date  Covid-19 vaccine status: Completed vaccines  Qualifies for Shingles Vaccine? Yes   Zostavax completed No   Shingrix Completed?: Yes  Screening Tests Health Maintenance  Topic Date Due   Medicare Annual Wellness (AWV)  02/15/2022   Diabetic kidney evaluation - Urine ACR  02/17/2027 (Originally 02/02/1964)   Diabetic kidney evaluation - GFR measurement  02/09/2023   MAMMOGRAM  01/23/2024   COLONOSCOPY (Pts 45-471yrInsurance coverage will need to be confirmed)  09/29/2030   TETANUS/TDAP  02/17/2031   Pneumonia Vaccine 6521Years old  Completed   INFLUENZA VACCINE  Completed   DEXA SCAN  Completed   COVID-19 Vaccine  Completed   Hepatitis C Screening  Completed   Zoster Vaccines- Shingrix  Completed   HPV VACCINES  Aged Out   HEMOGLOBIN A1C  Discontinued    Health Maintenance  Health Maintenance Due  Topic Date Due   Medicare Annual Wellness (AWV)  02/15/2022    Colorectal cancer  screening: Type of screening: Colonoscopy. Completed 09/28/2020. Repeat every 10 years  Mammogram status: Completed 01/22/2022. Repeat every year  Bone Density status: Completed 02/11/2020. Results reflect: Bone density results: OSTEOPENIA.   Lung Cancer Screening: (Low Dose CT Chest recommended if Age 74-80ears, 30 pack-year currently smoking OR have quit w/in 15years.) does not qualify.    Additional Screening:  Hepatitis C Screening: does not qualify; Completed 06/28/2015  Vision Screening: Recommended annual ophthalmology exams for early detection of glaucoma and other disorders of the eye. Is the patient up to date with their annual eye exam?  Yes  Who is the provider or what is the name of the office in which the patient attends annual eye exams[facility] provider, would they like to be referred to a provider to establish care? No .   Dental Screening: Recommended annual dental exams for proper oral hygiene  Community Resource Referral / Chronic Care Management: CRR required this visit?  No   CCM required this visit?  No      Plan:     I have personally reviewed and noted the following in the patient's chart:   Medical and social history Use of alcohol, tobacco or illicit drugs  Current medications and supplements including opioid prescriptions. Patient is not currently taking opioid prescriptions. Functional ability and status Nutritional status Physical activity Advanced directives  List of other physicians Hospitalizations, surgeries, and ER visits in previous 12 months Vitals Screenings to include cognitive, depression, and falls Referrals and appointments  In addition, I have reviewed and discussed with patient certain preventive protocols, quality metrics, and best practice recommendations. A written personalized care plan for preventive services as well as general preventive health recommendations were provided to  patient.     Sheri Dy, MD   02/27/2022

## 2022-03-10 ENCOUNTER — Other Ambulatory Visit: Payer: Self-pay | Admitting: Family Medicine

## 2022-03-19 ENCOUNTER — Ambulatory Visit (INDEPENDENT_AMBULATORY_CARE_PROVIDER_SITE_OTHER): Payer: Federal, State, Local not specified - PPO

## 2022-03-19 DIAGNOSIS — E538 Deficiency of other specified B group vitamins: Secondary | ICD-10-CM

## 2022-03-19 MED ORDER — CYANOCOBALAMIN 1000 MCG/ML IJ SOLN
1000.0000 ug | Freq: Once | INTRAMUSCULAR | Status: AC
  Administered 2022-03-19: 1000 ug via INTRAMUSCULAR

## 2022-03-20 ENCOUNTER — Other Ambulatory Visit: Payer: Self-pay

## 2022-03-20 ENCOUNTER — Emergency Department (HOSPITAL_BASED_OUTPATIENT_CLINIC_OR_DEPARTMENT_OTHER)
Admission: EM | Admit: 2022-03-20 | Discharge: 2022-03-20 | Disposition: A | Discharge status: Home / Self Care | Payer: Federal, State, Local not specified - PPO | Attending: Emergency Medicine | Admitting: Emergency Medicine

## 2022-03-20 ENCOUNTER — Encounter (HOSPITAL_BASED_OUTPATIENT_CLINIC_OR_DEPARTMENT_OTHER): Payer: Self-pay

## 2022-03-20 ENCOUNTER — Emergency Department (HOSPITAL_BASED_OUTPATIENT_CLINIC_OR_DEPARTMENT_OTHER): Payer: Federal, State, Local not specified - PPO

## 2022-03-20 DIAGNOSIS — I10 Essential (primary) hypertension: Secondary | ICD-10-CM | POA: Diagnosis not present

## 2022-03-20 DIAGNOSIS — M545 Low back pain, unspecified: Secondary | ICD-10-CM | POA: Diagnosis present

## 2022-03-20 DIAGNOSIS — M5441 Lumbago with sciatica, right side: Secondary | ICD-10-CM | POA: Insufficient documentation

## 2022-03-20 DIAGNOSIS — D72829 Elevated white blood cell count, unspecified: Secondary | ICD-10-CM | POA: Diagnosis not present

## 2022-03-20 DIAGNOSIS — Z79899 Other long term (current) drug therapy: Secondary | ICD-10-CM | POA: Insufficient documentation

## 2022-03-20 DIAGNOSIS — E119 Type 2 diabetes mellitus without complications: Secondary | ICD-10-CM | POA: Insufficient documentation

## 2022-03-20 LAB — URINALYSIS, ROUTINE W REFLEX MICROSCOPIC
Bilirubin Urine: NEGATIVE
Glucose, UA: NEGATIVE mg/dL
Ketones, ur: NEGATIVE mg/dL
Nitrite: NEGATIVE
Protein, ur: NEGATIVE mg/dL
Specific Gravity, Urine: 1.015 (ref 1.005–1.030)
pH: 5.5 (ref 5.0–8.0)

## 2022-03-20 LAB — URINALYSIS, MICROSCOPIC (REFLEX)

## 2022-03-20 MED ORDER — LIDOCAINE 5 % EX PTCH: 1.0000 | MEDICATED_PATCH | CUTANEOUS | 0 refills | Status: DC

## 2022-03-20 MED ORDER — IBUPROFEN 600 MG PO TABS: 600.0000 mg | ORAL_TABLET | Freq: Four times a day (QID) | ORAL | 0 refills | Status: DC | PRN

## 2022-03-20 MED ORDER — PREDNISONE 10 MG PO TABS: 50.0000 mg | ORAL_TABLET | Freq: Every day | ORAL | 0 refills | Status: DC

## 2022-03-20 MED ORDER — LIDOCAINE 5 % EX PTCH
1.0000 | MEDICATED_PATCH | CUTANEOUS | Status: DC
  Administered 2022-03-20: 1 via TRANSDERMAL
  Filled 2022-03-20: qty 1

## 2022-03-20 MED ORDER — PREDNISONE 50 MG PO TABS
50.0000 mg | ORAL_TABLET | Freq: Once | ORAL | Status: AC
  Administered 2022-03-20: 50 mg via ORAL
  Filled 2022-03-20: qty 1

## 2022-03-20 NOTE — ED Provider Notes (Signed)
[place] EMERGENCY DEPARTMENT Provider Note   CSN: [id number] Arrival date & time: 03/20/22  1221     History  Chief Complaint  Patient presents with   Back Pain    Sheri Jones is a 76 y.o. female.   Back Pain   76 year old female presents emergency department with complaints of right-sided back pain.  Patient states that symptoms again this past Wednesday.  She notes worsening of symptoms with walking and whenever she moves her back.  She reports radiation of pain down her right gluteal region down the posterior right leg past the knee.  Denies fever, saddle anesthesia, bowel/bladder dysfunction, weakness/sensory deficits in lower extremities, known falls/traumas, abdominal pain, fever, nausea, vomiting, urinary/vaginal symptoms, change in bowel habits.  Patient states that she has been taking Tylenol/Motrin at home for symptoms which has helped some.  She presents emergency department for further evaluation.  Past medical history significant for diabetes mellitus, obesity, hypertension, anemia, arthritis  Home Medications Prior to Admission medications   Medication Sig Start Date End Date Taking? Authorizing Provider  ibuprofen (ADVIL) 600 MG tablet Take 1 tablet (600 mg total) by mouth every 6 (six) hours as needed. 03/20/22  Yes Dion Saucier A, PA  lidocaine (LIDODERM) 5 % Place 1 patch onto the skin daily. Remove & Discard patch within 12 hours or as directed by MD 03/20/22  Yes Dion Saucier A, PA  predniSONE (DELTASONE) 10 MG tablet Take 5 tablets (50 mg total) by mouth daily. 03/20/22  Yes Dion Saucier A, PA  acetaminophen (TYLENOL) 325 MG tablet Take 650 mg by mouth every 6 (six) hours as needed for mild pain.    [provider]  CALCIUM PO Take 2 tablets by mouth daily.    [provider]  cholecalciferol (VITAMIN D3) 25 MCG (1000 UT) tablet Take 1,000 Units by mouth daily.    [provider]  cyanocobalamin (,VITAMIN  B-12,) 1000 MCG/ML injection Inject 1,000 mcg into the muscle every 30 (thirty) days.    [provider]  diphenhydrAMINE (BENADRYL) 25 MG tablet Take 25 mg by mouth every 8 (eight) hours as needed for allergies.    [provider]  hydrochlorothiazide (HYDRODIURIL) 25 MG tablet TAKE 1 TABLET BY MOUTH EVERY DAY 03/12/22   Fayrene Helper, MD  lisinopril-hydrochlorothiazide (ZESTORETIC) 20-25 MG tablet TAKE 1 TABLET BY MOUTH EVERY DAY 01/11/22   Fayrene Helper, MD  Multiple Vitamins-Minerals (CENTRUM ULTRA WOMENS PO) Take 1 tablet by mouth daily.    [provider]  pantoprazole (PROTONIX) 20 MG tablet TAKE 1 TABLET (20 MG TOTAL) BY MOUTH DAILY AS NEEDED FOR HEARTBURN OR INDIGESTION. 09/25/21   Fayrene Helper, MD  potassium chloride (KLOR-CON) 10 MEQ tablet TAKE 1 TABLET BY MOUTH TWICE A DAY 01/31/22   Fayrene Helper, MD  Semaglutide-Weight Management 1 MG/0.5ML SOAJ Inject 1 mg into the skin once a week. Patient not taking: Reported on 02/16/2022 01/02/22   Fayrene Helper, MD  temazepam (RESTORIL) 7.5 MG capsule Take 1 capsule (7.5 mg total) by mouth at bedtime as needed for sleep. Patient not taking: Reported on 05/19/2019 04/29/19 09/22/19  Fayrene Helper, MD      Allergies    Patient has no known allergies.    Review of Systems   Review of Systems  Musculoskeletal:  Positive for back pain.  All other systems reviewed and are negative.   Physical Exam Updated Vital Signs BP 121/61 (BP Location: Right Arm)  Pulse 85   Temp 98.1 F (36.7 C)   Resp 16   Ht '5\' 3"'$  (1.6 m)   Wt 94.3 kg   SpO2 98%   BMI 36.85 kg/m  Physical Exam Vitals and nursing note reviewed.  Constitutional:      General: She is not in acute distress.    Appearance: She is well-developed.  HENT:     Head: Normocephalic and atraumatic.  Eyes:     Conjunctiva/sclera: Conjunctivae normal.  Cardiovascular:     Rate and Rhythm: Normal rate and regular rhythm.      Heart sounds: No murmur heard. Pulmonary:     Effort: Pulmonary effort is normal. No respiratory distress.     Breath sounds: Normal breath sounds.  Abdominal:     Palpations: Abdomen is soft.     Tenderness: There is no abdominal tenderness.  Musculoskeletal:        General: No swelling.     Cervical back: Neck supple.     Comments: Mild paraspinal tenderness right in the lumbar region.  No midline tenderness of cervical, thoracic, lumbar spine with no obvious step-off or deformity noted.  No tenderness palpation of bilateral hip or remaining lower extremities.  Muscle strength out of 5 in knee flexion, extension, ankle dorsi/plantarflexion.  Pedal pulses full intact bilaterally.  No sensory deficits along major nerve distributions of lower extremities.  DTR symmetric and equal.  Skin:    General: Skin is warm and dry.     Capillary Refill: Capillary refill takes less than 2 seconds.  Neurological:     Mental Status: She is alert.  Psychiatric:        Mood and Affect: Mood normal.     ED Results / Procedures / Treatments   Labs (all labs ordered are listed, but only abnormal results are displayed) Labs Reviewed  URINALYSIS, ROUTINE W REFLEX MICROSCOPIC - Abnormal; Notable for the following components:      Result Value   Hgb urine dipstick TRACE (*)    Leukocytes,Ua SMALL (*)    All other components within normal limits  URINALYSIS, MICROSCOPIC (REFLEX) - Abnormal; Notable for the following components:   Bacteria, UA FEW (*)    All other components within normal limits    EKG None  Radiology DG Lumbar Spine Complete  Result Date: 03/20/2022 CLINICAL DATA:  Back pain EXAM: LUMBAR SPINE - COMPLETE 5 VIEW COMPARISON:  None Available. FINDINGS: Grade 1 L5 retrolisthesis noted. There is degenerative disc disease with loss of disc spaces at each lumbar level marginal osteophytes especially at L1-2 and L4-5. No compression deformities. No osteolytic or osteoblastic changes.  IMPRESSION: Degenerative changes.  No acute osseous abnormalities. Electronically Signed   By: Sammie Bench M.D.   On: 03/20/2022 16:28    Procedures Procedures    Medications Ordered in ED Medications  lidocaine (LIDODERM) 5 % 1 patch (has no administration in time range)  predniSONE (DELTASONE) tablet 50 mg (has no administration in time range)    ED Course/ Medical Decision Making/ A&P                           Medical Decision Making Amount and/or Complexity of Data Reviewed Labs: ordered. Radiology: ordered.   This patient presents to the ED for concern of back pain, this involves an extensive number of treatment options, and is a complaint that carries with it a high risk of complications and morbidity.  The differential diagnosis  includes strain/sprain, sciatica, fracture, dislocation, malignancy, cauda equina, spinal epidural abscess, AAA, abdominal pathology   Co morbidities that complicate the patient evaluation  See HPI   Additional history obtained:  Additional history obtained from EMR External records from outside source obtained and reviewed including hospital records   Lab Tests:  I Ordered, and personally interpreted labs.  The pertinent results include: UA significant for few bacteria, small leukocytes; patient without urinary symptoms will not treat urinary tract infection.   Imaging Studies ordered:  I ordered imaging studies including lumbar x-ray I independently visualized and interpreted imaging which showed degenerative changes.  No acute osseous abnormalities. I agree with the radiologist interpretation  Cardiac Monitoring: / EKG:  The patient was maintained on a cardiac monitor.  I personally viewed and interpreted the cardiac monitored which showed an underlying rhythm of: Sinus rhythm   Consultations Obtained:  N/a   Problem List / ED Course / Critical interventions / Medication management  Back pain I ordered medication  including prednisone and Lidoderm for pain  Reevaluation of the patient after these medicines showed that the patient improved I have reviewed the patients home medicines and have made adjustments as needed   Social Determinants of Health:  Denies tobacco, licit drug use.   Test / Admission - Considered:  Low back pain Vitals signs within normal range and stable throughout visit. Laboratory/imaging studies significant for: See above Patient's symptoms likely secondary to sciatica type etiology.  Doubt cauda equina/spinal epidural abscess or other cord impingement given lack of red flag signs.  Doubt intra-abdominal pathology given lack of tenderness with concerning signs on HPI.  We will treat with prednisone taper, NSAIDs and Lidoderm patch.  Follow-up with PCP/neurosurgery recommended for continued symptoms.  Treatment plan discussed at length with patient she knowledge understanding was agreeable to said plans. Worrisome signs and symptoms were discussed with the patient, and the patient acknowledged understanding to return to the ED if noticed. Patient was stable upon discharge.          Final Clinical Impression(s) / ED Diagnoses Final diagnoses:  Acute right-sided low back pain with right-sided sciatica    Rx / DC Orders ED Discharge Orders          Ordered    predniSONE (DELTASONE) 10 MG tablet  Daily        03/20/22 1637    lidocaine (LIDODERM) 5 %  Every 24 hours        03/20/22 1637    ibuprofen (ADVIL) 600 MG tablet  Every 6 hours PRN        03/20/22 [place], [place] A, [place] 03/20/22 1637    Hayden Rasmussen, MD 03/21/22 1112

## 2022-03-20 NOTE — Discharge Instructions (Signed)
Note the visit to the emergency department today was overall reassuring.  Symptoms likely secondary to sciatica type back pain.  We will treat this with steroid, NSAID and numbing patch.  Take medicines as prescribed.  Recommend follow-up with her primary care provider for reevaluation of symptoms in 3 to 5 days.  Also provide number for neurosurgery if symptoms continue.  Please not hesitate to return to emergency department for worrisome signs and symptoms we discussed become apparent.

## 2022-03-20 NOTE — ED Triage Notes (Signed)
C/o lumbar back pain since last Wednesday. Denies urinary symptoms or injury.

## 2022-03-21 ENCOUNTER — Telehealth: Payer: Self-pay

## 2022-03-21 NOTE — Telephone Encounter (Signed)
Transition Care Management Follow-up Telephone Call Date of discharge and from where: [facility] 03-20-22 Dx: low back pain  How have you been since you were released from the hospital? Back pain is better  Any questions or concerns? No  Items Reviewed: Did the pt receive and understand the discharge instructions provided? Yes  Medications obtained and verified? Yes  Other? No  Any new allergies since your discharge? No  Dietary orders reviewed? Yes Do you have support at home? Yes   Home Care and Equipment/Supplies: Were home health services ordered? not applicable If so, what is the name of the agency? na  Has the agency set up a time to come to the patient's home? not applicable Were any new equipment or medical supplies ordered?  No What is the name of the medical supply agency? na Were you able to get the supplies/equipment? not applicable Do you have any questions related to the use of the equipment or supplies? No  Functional Questionnaire: (I = Independent and D = Dependent) ADLs: I  Bathing/Dressing- I  Meal Prep- I  Eating- I  Maintaining continence- I  Transferring/Ambulation- I  Managing Meds- I  Follow up appointments reviewed:  PCP Hospital f/u appt confirmed? No-no appt available -will ask front desk to contact pt to schedule appt  [facility] f/u appt confirmed? No . Are transportation arrangements needed? No  If their condition worsens, is the pt aware to call PCP or go to the Emergency Dept.? Yes Was the patient provided with contact information for the PCP's office or ED? Yes Was to pt encouraged to call back with questions or concerns? Yes   Juanda Crumble LPN [place] [phone]

## 2022-04-18 ENCOUNTER — Ambulatory Visit: Payer: Federal, State, Local not specified - PPO

## 2022-04-19 ENCOUNTER — Ambulatory Visit (INDEPENDENT_AMBULATORY_CARE_PROVIDER_SITE_OTHER): Payer: Federal, State, Local not specified - PPO

## 2022-04-19 DIAGNOSIS — E538 Deficiency of other specified B group vitamins: Secondary | ICD-10-CM | POA: Diagnosis not present

## 2022-04-19 MED ORDER — CYANOCOBALAMIN 1000 MCG/ML IJ SOLN
1000.0000 ug | Freq: Once | INTRAMUSCULAR | Status: AC
  Administered 2022-04-19: 1000 ug via INTRAMUSCULAR

## 2022-04-24 ENCOUNTER — Telehealth: Payer: Self-pay | Admitting: Family Medicine

## 2022-04-24 NOTE — Telephone Encounter (Signed)
Pt called wanting to know if she did receive the high dose flu shot

## 2022-04-24 NOTE — Telephone Encounter (Signed)
Patient aware she did receive the high dose flu shot

## 2022-05-21 ENCOUNTER — Ambulatory Visit (INDEPENDENT_AMBULATORY_CARE_PROVIDER_SITE_OTHER): Payer: Federal, State, Local not specified - PPO

## 2022-05-21 DIAGNOSIS — E538 Deficiency of other specified B group vitamins: Secondary | ICD-10-CM

## 2022-05-21 MED ORDER — CYANOCOBALAMIN 1000 MCG/ML IJ SOLN
1000.0000 ug | Freq: Once | INTRAMUSCULAR | Status: AC
  Administered 2022-05-21: 1000 ug via INTRAMUSCULAR

## 2022-05-25 ENCOUNTER — Ambulatory Visit: Payer: Federal, State, Local not specified - PPO | Admitting: Internal Medicine

## 2022-06-19 ENCOUNTER — Ambulatory Visit: Payer: Federal, State, Local not specified - PPO | Admitting: Family Medicine

## 2022-06-21 ENCOUNTER — Encounter: Payer: Self-pay | Admitting: Radiology

## 2022-06-21 ENCOUNTER — Ambulatory Visit (INDEPENDENT_AMBULATORY_CARE_PROVIDER_SITE_OTHER): Payer: Federal, State, Local not specified - PPO

## 2022-06-21 DIAGNOSIS — E538 Deficiency of other specified B group vitamins: Secondary | ICD-10-CM | POA: Diagnosis not present

## 2022-06-21 MED ORDER — CYANOCOBALAMIN 1000 MCG/ML IJ SOLN
1000.0000 ug | Freq: Once | INTRAMUSCULAR | Status: AC
  Administered 2022-06-21: 1000 ug via INTRAMUSCULAR

## 2022-07-10 ENCOUNTER — Ambulatory Visit: Payer: Federal, State, Local not specified - PPO | Admitting: Family Medicine

## 2022-07-10 ENCOUNTER — Encounter: Payer: Self-pay | Admitting: Family Medicine

## 2022-07-10 VITALS — BP 117/73 | HR 77 | Ht 63.0 in | Wt 210.0 lb

## 2022-07-10 DIAGNOSIS — I10 Essential (primary) hypertension: Secondary | ICD-10-CM

## 2022-07-10 DIAGNOSIS — E538 Deficiency of other specified B group vitamins: Secondary | ICD-10-CM

## 2022-07-10 DIAGNOSIS — E559 Vitamin D deficiency, unspecified: Secondary | ICD-10-CM | POA: Diagnosis not present

## 2022-07-10 DIAGNOSIS — R21 Rash and other nonspecific skin eruption: Secondary | ICD-10-CM | POA: Insufficient documentation

## 2022-07-10 DIAGNOSIS — J302 Other seasonal allergic rhinitis: Secondary | ICD-10-CM

## 2022-07-10 MED ORDER — SEMAGLUTIDE-WEIGHT MANAGEMENT 1.7 MG/0.75ML ~~LOC~~ SOAJ: 1.7000 mg | SUBCUTANEOUS | 2 refills | Status: DC

## 2022-07-10 NOTE — Patient Instructions (Signed)
Annual exam in 12 weeks, call if you need me sooner  Chem 7 and EGFr today please    No new meds   Use hydrocortisone cream twice daily to rash on right leg for 10 days    Thanks for choosing [facility], we consider it a privelige to serve you.'

## 2022-07-10 NOTE — Assessment & Plan Note (Signed)
  Patient re-educated about  the importance of commitment to a  minimum of 150 minutes of exercise per week as able.  The importance of healthy food choices with portion control discussed, as well as eating regularly and within a 12 hour window most days. The need to choose "clean , green" food 50 to 75% of the time is discussed, as well as to make water the primary drink and set a goal of 64 ounces water daily.       07/10/2022    3:44 PM 03/20/2022   12:47 PM 02/16/2022   11:00 AM  Weight /BMI  Weight 210 lb 0.6 oz 208 lb 209 lb 1.9 oz  Height 5\' 3"  (1.6 m) 5\' 3"  (1.6 m) 5\' 3"  (1.6 m)  BMI 37.21 kg/m2 36.85 kg/m2 37.04 kg/m2    Trial of wegivy 1.7 mg weekly, if not covered will take phentermine

## 2022-07-10 NOTE — Assessment & Plan Note (Signed)
Controlled, no change in medication DASH diet and commitment to daily physical activity for a minimum of 30 minutes discussed and encouraged, as a part of hypertension management. The importance of attaining a healthy weight is also discussed.     07/10/2022    3:44 PM 03/20/2022    4:30 PM 03/20/2022   12:50 PM 03/20/2022   12:47 PM 02/16/2022   11:00 AM 01/02/2022   11:26 AM 10/20/2021   10:41 AM  BP/Weight  Systolic BP 123XX123 [id number] 123XX123  123456 [id number] [id number]  Diastolic BP 73 64 61  78 78 78  Wt. (Lbs) 210.04   208 209.12 210 208  BMI 37.21 kg/m2   36.85 kg/m2 37.04 kg/m2 37.2 kg/m2 36.85 kg/m2

## 2022-07-10 NOTE — Assessment & Plan Note (Signed)
Hyperpigmented macular rash dia approx 2 in, topical steroid twice daily for 10 days ten as needed, will use hydrocortisone which she already has

## 2022-07-10 NOTE — Assessment & Plan Note (Signed)
Continue monthly IM injections 1 cc Vit B12

## 2022-07-10 NOTE — Assessment & Plan Note (Signed)
COntinue daily supplement, will update lab in the future

## 2022-07-10 NOTE — Progress Notes (Signed)
Sheri Jones     MRN: [id number]      DOB: 1946-01-06   HPI Sheri Jones is here for follow up and re-evaluation of chronic medical conditions, medication management and review of any available recent lab and radiology data.  Preventive health is updated, specifically  Cancer screening and Immunization.   Questions or concerns regarding consultations or procedures which the PT has had in the interim are  addressed. The PT denies any adverse reactions to current medications since the last visit.  C/o dark area on outer side right leg, does not itch or hurt, no drainage from the area, seems to be increasing in size C/o intermittent triggering of right middle finger x 1 month C/o inability to lose weigh despite lifestyle change ROS Denies recent fever or chills. Denies sinus pressure, nasal congestion, ear pain or sore throat. Denies chest congestion, productive cough or wheezing. Denies chest pains, palpitations and leg swelling Denies abdominal pain, nausea, vomiting,diarrhea or constipation.   Denies dysuria, frequency, hesitancy or incontinence. C/o intermittent left knee pain and stiffness, has upcoming ortho appt Denies headaches, seizures, numbness, or tingling. Denies depression, anxiety or insomnia.  PE  BP 117/73 (BP Location: Right Arm, Patient Position: Sitting, Cuff Size: Large)   Pulse 77   Ht 5\' 3"  (1.6 m)   Wt 210 lb 0.6 oz (95.3 kg)   SpO2 95%   BMI 37.21 kg/m   Patient alert and oriented and in no cardiopulmonary distress.  HEENT: No facial asymmetry, EOMI,     Neck supple .  Chest: Clear to auscultation bilaterally.  CVS: S1, S2 no murmurs, no S3.Regular rate.  ABD: Soft non tender.   Ext: No edema  MS: decreased ROM spine, shoulders, hips and knees.  Skin: Intact, no ulcerations or rash noted.  Psych: Good eye contact, normal affect. Memory intact not anxious or depressed appearing.  CNS: CN 2-12 intact, power,  normal throughout.no focal deficits  noted.   Assessment & Plan  Essential hypertension Controlled, no change in medication DASH diet and commitment to daily physical activity for a minimum of 30 minutes discussed and encouraged, as a part of hypertension management. The importance of attaining a healthy weight is also discussed.     07/10/2022    3:44 PM 03/20/2022    4:30 PM 03/20/2022   12:50 PM 03/20/2022   12:47 PM 02/16/2022   11:00 AM 01/02/2022   11:26 AM 10/20/2021   10:41 AM  BP/Weight  Systolic BP 123XX123 [id number] 123XX123  123456 123XX123 [id number]  Diastolic BP 73 64 61  78 78 78  Wt. (Lbs) 210.04   208 209.12 210 208  BMI 37.21 kg/m2   36.85 kg/m2 37.04 kg/m2 37.2 kg/m2 36.85 kg/m2       Morbid obesity (HCC)  Patient re-educated about  the importance of commitment to a  minimum of 150 minutes of exercise per week as able.  The importance of healthy food choices with portion control discussed, as well as eating regularly and within a 12 hour window most days. The need to choose "clean , green" food 50 to 75% of the time is discussed, as well as to make water the primary drink and set a goal of 64 ounces water daily.       07/10/2022    3:44 PM 03/20/2022   12:47 PM 02/16/2022   11:00 AM  Weight /BMI  Weight 210 lb 0.6 oz 208 lb 209 lb 1.9 oz  Height 5\' 3"  (1.6  m) 5\' 3"  (1.6 m) 5\' 3"  (1.6 m)  BMI 37.21 kg/m2 36.85 kg/m2 37.04 kg/m2    Trial of wegivy 1.7 mg weekly, if not covered will take phentermine  Vitamin D deficiency COntinue daily supplement, will update lab in the future  Vitamin B12 deficiency disease Continue monthly IM injections 1 cc Vit B12  Chronic seasonal allergic rhinitis Controlled, no change in medication   Rash and nonspecific skin eruption Hyperpigmented macular rash dia approx 2 in, topical steroid twice daily for 10 days ten as needed, will use hydrocortisone which she already has

## 2022-07-10 NOTE — Assessment & Plan Note (Signed)
Controlled, no change in medication  

## 2022-07-11 LAB — BMP8+EGFR
BUN/Creatinine Ratio: 21 (ref 12–28)
BUN: 20 mg/dL (ref 8–27)
CO2: 20 mmol/L (ref 20–29)
Calcium: 8.9 mg/dL (ref 8.7–10.3)
Chloride: 106 mmol/L (ref 96–106)
Creatinine, Ser: 0.94 mg/dL (ref 0.57–1.00)
Glucose: 82 mg/dL (ref 70–99)
Potassium: 4.3 mmol/L (ref 3.5–5.2)
Sodium: 141 mmol/L (ref 134–144)
eGFR: 64 mL/min/{1.73_m2} (ref >59)

## 2022-07-20 ENCOUNTER — Ambulatory Visit (INDEPENDENT_AMBULATORY_CARE_PROVIDER_SITE_OTHER): Payer: Federal, State, Local not specified - PPO

## 2022-07-20 DIAGNOSIS — E538 Deficiency of other specified B group vitamins: Secondary | ICD-10-CM | POA: Diagnosis not present

## 2022-07-20 MED ORDER — CYANOCOBALAMIN 1000 MCG/ML IJ SOLN
1000.0000 ug | Freq: Once | INTRAMUSCULAR | Status: AC
  Administered 2022-07-20: 1000 ug via INTRAMUSCULAR

## 2022-07-25 ENCOUNTER — Other Ambulatory Visit: Payer: Self-pay | Admitting: Family Medicine

## 2022-07-27 ENCOUNTER — Ambulatory Visit: Payer: Federal, State, Local not specified - PPO | Admitting: Family Medicine

## 2022-08-20 ENCOUNTER — Ambulatory Visit (INDEPENDENT_AMBULATORY_CARE_PROVIDER_SITE_OTHER): Payer: Federal, State, Local not specified - PPO

## 2022-08-20 DIAGNOSIS — E538 Deficiency of other specified B group vitamins: Secondary | ICD-10-CM | POA: Diagnosis not present

## 2022-08-20 MED ORDER — CYANOCOBALAMIN 1000 MCG/ML IJ SOLN
1000.0000 ug | Freq: Once | INTRAMUSCULAR | Status: AC
  Administered 2022-08-20: 1000 ug via INTRAMUSCULAR

## 2022-09-03 ENCOUNTER — Other Ambulatory Visit: Payer: Self-pay | Admitting: Family Medicine

## 2022-09-19 ENCOUNTER — Ambulatory Visit: Payer: Federal, State, Local not specified - PPO

## 2022-09-20 ENCOUNTER — Ambulatory Visit (INDEPENDENT_AMBULATORY_CARE_PROVIDER_SITE_OTHER): Payer: Federal, State, Local not specified - PPO

## 2022-09-20 DIAGNOSIS — E538 Deficiency of other specified B group vitamins: Secondary | ICD-10-CM | POA: Diagnosis not present

## 2022-09-20 MED ORDER — CYANOCOBALAMIN 1000 MCG/ML IJ SOLN
1000.0000 ug | Freq: Once | INTRAMUSCULAR | Status: AC
  Administered 2022-09-20: 1000 ug via INTRAMUSCULAR

## 2022-10-08 ENCOUNTER — Encounter: Payer: Self-pay | Admitting: Orthopedic Surgery

## 2022-10-08 ENCOUNTER — Other Ambulatory Visit (INDEPENDENT_AMBULATORY_CARE_PROVIDER_SITE_OTHER): Payer: Federal, State, Local not specified - PPO

## 2022-10-08 ENCOUNTER — Other Ambulatory Visit: Payer: Self-pay

## 2022-10-08 ENCOUNTER — Ambulatory Visit: Payer: Federal, State, Local not specified - PPO | Admitting: Orthopedic Surgery

## 2022-10-08 VITALS — BP 142/63 | HR 83 | Ht 63.0 in | Wt 197.0 lb

## 2022-10-08 DIAGNOSIS — M25562 Pain in left knee: Secondary | ICD-10-CM

## 2022-10-08 DIAGNOSIS — M65331 Trigger finger, right middle finger: Secondary | ICD-10-CM | POA: Diagnosis not present

## 2022-10-08 DIAGNOSIS — Z96651 Presence of right artificial knee joint: Secondary | ICD-10-CM | POA: Diagnosis not present

## 2022-10-08 DIAGNOSIS — Z96652 Presence of left artificial knee joint: Secondary | ICD-10-CM

## 2022-10-08 DIAGNOSIS — G8929 Other chronic pain: Secondary | ICD-10-CM

## 2022-10-08 DIAGNOSIS — M25561 Pain in right knee: Secondary | ICD-10-CM

## 2022-10-08 DIAGNOSIS — M170 Bilateral primary osteoarthritis of knee: Secondary | ICD-10-CM

## 2022-10-08 NOTE — Progress Notes (Signed)
Chief Complaint  Patient presents with   Knee Pain    Follow up bil knee with some tightness under the left knee    Lt tka 2020  Rt tka 2016, MUA 04/2015  Xrays no ev of loosening, alignment is normal   Both knees are stiff and limited to less than 90 degrees of flexion.  New complaint pain right long finger with catching locking triggering requiring pulling the finger out to extension  Trigger finger right long finger  Patient has consented for injection  Encounter Diagnoses  Name Primary?   Chronic pain of right knee    Chronic pain of left knee    Bilateral primary osteoarthritis of knee    Trigger finger, right middle finger Yes     Trigger finger injection  Diagnosis tenosynovitis right long finger Procedure injection A1 pulley Medications lidocaine 1% 1 mL and Depo-Medrol 40 mg 1 mL Skin prep alcohol and ethyl chloride Verbal consent was obtained Timeout confirmed the injection site  After cleaning the skin with alcohol and anesthetizing the skin with ethyl chloride the A1 pulley was palpated and the injection was performed without complication

## 2022-10-11 ENCOUNTER — Ambulatory Visit (INDEPENDENT_AMBULATORY_CARE_PROVIDER_SITE_OTHER): Payer: Federal, State, Local not specified - PPO | Admitting: Family Medicine

## 2022-10-11 ENCOUNTER — Encounter: Payer: Self-pay | Admitting: Family Medicine

## 2022-10-11 VITALS — BP 127/80 | HR 82 | Ht 63.0 in | Wt 197.0 lb

## 2022-10-11 DIAGNOSIS — Z1231 Encounter for screening mammogram for malignant neoplasm of breast: Secondary | ICD-10-CM

## 2022-10-11 DIAGNOSIS — Z13220 Encounter for screening for lipoid disorders: Secondary | ICD-10-CM

## 2022-10-11 DIAGNOSIS — Z0001 Encounter for general adult medical examination with abnormal findings: Secondary | ICD-10-CM | POA: Diagnosis not present

## 2022-10-11 DIAGNOSIS — E559 Vitamin D deficiency, unspecified: Secondary | ICD-10-CM

## 2022-10-11 DIAGNOSIS — I10 Essential (primary) hypertension: Secondary | ICD-10-CM | POA: Diagnosis not present

## 2022-10-11 MED ORDER — SEMAGLUTIDE-WEIGHT MANAGEMENT 1.7 MG/0.75ML ~~LOC~~ SOAJ: 1.7000 mg | SUBCUTANEOUS | 2 refills | Status: DC

## 2022-10-11 MED ORDER — PANTOPRAZOLE SODIUM 20 MG PO TBEC: 20.0000 mg | DELAYED_RELEASE_TABLET | Freq: Every day | ORAL | 0 refills | Status: DC | PRN

## 2022-10-11 NOTE — Progress Notes (Signed)
Sheri Jones     MRN: [id number]      DOB: 01/24/1946  Chief Complaint  Patient presents with   Annual Exam    CPE headaches, discuss medications    HPI: Patient is in for annual physical exam. Had  frontal headache 3 weeks ago, has resolved, allergy related. Immunization is reviewed , and  is up to date.   PE: BP 127/80 (BP Location: Right Arm, Patient Position: Sitting, Cuff Size: Large)   Pulse 82   Ht 5\' 3"  (1.6 m)   Wt 197 lb 0.6 oz (89.4 kg)   SpO2 93%   BMI 34.90 kg/m   Pleasant  female, alert and oriented x 3, in no cardio-pulmonary distress. Afebrile. HEENT No facial trauma or asymetry. Sinuses non tender.  Extra occullar muscles intact.. External ears normal, . Neck: supple, no adenopathy,JVD or thyromegaly.No bruits.  Chest: Clear to ascultation bilaterally.No crackles or wheezes. Non tender to palpation  Cardiovascular system; Heart sounds normal,  S1 and  S2 ,no S3.  No murmur, or thrill. Apical beat not displaced Peripheral pulses normal.  Abdomen: Soft, non tender, no organomegaly or masses. No bruits. Bowel sounds normal. No guarding, tenderness or rebound.   .   Musculoskeletal exam: Full ROM of spine, hips , shoulders and reduced in  knees. No deformity ,swelling or crepitus noted. No muscle wasting or atrophy.   Neurologic: Cranial nerves 2 to 12 intact. Power, tone ,sensation  normal throughout. disturbance in gait. No tremor.  Skin: Intact, no ulceration, erythema , scaling or rash noted. Pigmentation normal throughout  Psych; Normal mood and affect. Judgement and concentration normal   Assessment & Plan:  Annual visit for general adult medical examination with abnormal findings Annual exam as documented. Counseling done  re healthy lifestyle involving commitment to 150 minutes exercise per week, heart healthy diet, and attaining healthy weight.The importance of adequate sleep also discussed. Regular seat belt use and  home safety, is also discussed. Changes in health habits are decided on by the patient with goals and time frames  set for achieving them. Immunization and cancer screening needs are specifically addressed at this visit.   Essential hypertension Controlled, no change in medication DASH diet and commitment to daily physical activity for a minimum of 30 minutes discussed and encouraged, as a part of hypertension management. The importance of attaining a healthy weight is also discussed.     10/11/2022   10:47 AM 10/08/2022   11:12 AM 07/10/2022    3:44 PM 03/20/2022    4:30 PM 03/20/2022   12:50 PM 03/20/2022   12:47 PM 02/16/2022   11:00 AM  BP/Weight  Systolic BP 127 142 117 140 121  120  Diastolic BP 80 63 73 64 61  78  Wt. (Lbs) 197.04 197 210.04   208 209.12  BMI 34.9 kg/m2 34.9 kg/m2 37.21 kg/m2   36.85 kg/m2 37.04 kg/m2       Morbid obesity (HCC) Improved greatly continue current med  Patient re-educated about  the importance of commitment to a  minimum of 150 minutes of exercise per week as able.  The importance of healthy food choices with portion control discussed, as well as eating regularly and within a 12 hour window most days. The need to choose "clean , green" food 50 to 75% of the time is discussed, as well as to make water the primary drink and set a goal of 64 ounces water daily.  10/11/2022   10:47 AM 10/08/2022   11:12 AM 07/10/2022    3:44 PM  Weight /BMI  Weight 197 lb 0.6 oz 197 lb 210 lb 0.6 oz  Height 5\' 3"  (1.6 m) 5\' 3"  (1.6 m) 5\' 3"  (1.6 m)  BMI 34.9 kg/m2 34.9 kg/m2 37.21 kg/m2      Vitamin D deficiency Updated lab needed at/ before next visit.

## 2022-10-11 NOTE — Assessment & Plan Note (Signed)
Improved greatly continue current med  Patient re-educated about  the importance of commitment to a  minimum of 150 minutes of exercise per week as able.  The importance of healthy food choices with portion control discussed, as well as eating regularly and within a 12 hour window most days. The need to choose "clean , green" food 50 to 75% of the time is discussed, as well as to make water the primary drink and set a goal of 64 ounces water daily.       10/11/2022   10:47 AM 10/08/2022   11:12 AM 07/10/2022    3:44 PM  Weight /BMI  Weight 197 lb 0.6 oz 197 lb 210 lb 0.6 oz  Height 5\' 3"  (1.6 m) 5\' 3"  (1.6 m) 5\' 3"  (1.6 m)  BMI 34.9 kg/m2 34.9 kg/m2 37.21 kg/m2

## 2022-10-11 NOTE — Assessment & Plan Note (Signed)
Controlled, no change in medication DASH diet and commitment to daily physical activity for a minimum of 30 minutes discussed and encouraged, as a part of hypertension management. The importance of attaining a healthy weight is also discussed.     10/11/2022   10:47 AM 10/08/2022   11:12 AM 07/10/2022    3:44 PM 03/20/2022    4:30 PM 03/20/2022   12:50 PM 03/20/2022   12:47 PM 02/16/2022   11:00 AM  BP/Weight  Systolic BP 127 142 117 140 121  120  Diastolic BP 80 63 73 64 61  78  Wt. (Lbs) 197.04 197 210.04   208 209.12  BMI 34.9 kg/m2 34.9 kg/m2 37.21 kg/m2   36.85 kg/m2 37.04 kg/m2

## 2022-10-11 NOTE — Assessment & Plan Note (Signed)
Updated lab needed at/ before next visit.   

## 2022-10-11 NOTE — Assessment & Plan Note (Addendum)

## 2022-10-11 NOTE — Patient Instructions (Addendum)
F/U in 13 weeks,  call if you need me sooner    CONGRATS on excellent weight loss , keep it up  Please schedule mammogram at checkout  Labs today,CBC, lipid, cmp and EGFr, tSH and vit D  It is important that you exercise regularly at least 30 minutes 5 times a week. If you develop chest pain, have severe difficulty breathing, or feel very tired, stop exercising immediately and seek medical attention    Think about what you will eat, plan ahead. Choose " clean, green, fresh or frozen" over canned, processed or packaged foods which are more sugary, salty and fatty. 70 to 75% of food eaten should be vegetables and fruit. Three meals at set times with snacks allowed between meals, but they must be fruit or vegetables. Aim to eat over a 12 hour period , example 7 am to 7 pm, and STOP after  your last meal of the day. Drink water,generally about 64 ounces per day, no other drink is as healthy. Fruit juice is best enjoyed in a healthy way, by EATING the fruit.

## 2022-10-12 LAB — CBC
Hematocrit: 38.2 % (ref 34.0–46.6)
Hemoglobin: 12.5 g/dL (ref 11.1–15.9)
MCH: 31 pg (ref 26.6–33.0)
MCHC: 32.7 g/dL (ref 31.5–35.7)
MCV: 95 fL (ref 79–97)
Platelets: 172 10*3/uL (ref 150–450)
RBC: 4.03 x10E6/uL (ref 3.77–5.28)
RDW: 13.6 % (ref 11.7–15.4)
WBC: 5.3 10*3/uL (ref 3.4–10.8)

## 2022-10-12 LAB — CMP14+EGFR
ALT: 22 IU/L (ref 0–32)
AST: 24 IU/L (ref 0–40)
Albumin: 4.1 g/dL (ref 3.8–4.8)
Alkaline Phosphatase: 95 IU/L (ref 44–121)
BUN/Creatinine Ratio: 21 (ref 12–28)
BUN: 19 mg/dL (ref 8–27)
Bilirubin Total: 0.5 mg/dL (ref 0.0–1.2)
CO2: 22 mmol/L (ref 20–29)
Calcium: 9.5 mg/dL (ref 8.7–10.3)
Chloride: 101 mmol/L (ref 96–106)
Creatinine, Ser: 0.92 mg/dL (ref 0.57–1.00)
Globulin, Total: 2.5 g/dL (ref 1.5–4.5)
Glucose: 76 mg/dL (ref 70–99)
Potassium: 4.3 mmol/L (ref 3.5–5.2)
Sodium: 142 mmol/L (ref 134–144)
Total Protein: 6.6 g/dL (ref 6.0–8.5)
eGFR: 65 mL/min/{1.73_m2} (ref >59)

## 2022-10-12 LAB — LIPID PANEL
Chol/HDL Ratio: 2.7 ratio (ref 0.0–4.4)
Cholesterol, Total: 193 mg/dL (ref 100–199)
HDL: 72 mg/dL (ref >39)
LDL Chol Calc (NIH): 110 mg/dL — ABNORMAL HIGH (ref 0–99)
Triglycerides: 57 mg/dL (ref 0–149)
VLDL Cholesterol Cal: 11 mg/dL (ref 5–40)

## 2022-10-12 LAB — TSH: TSH: 0.734 u[IU]/mL (ref 0.450–4.500)

## 2022-10-12 LAB — VITAMIN D 25 HYDROXY (VIT D DEFICIENCY, FRACTURES): Vit D, 25-Hydroxy: 53.6 ng/mL (ref 30.0–100.0)

## 2022-10-19 ENCOUNTER — Ambulatory Visit (INDEPENDENT_AMBULATORY_CARE_PROVIDER_SITE_OTHER): Payer: Federal, State, Local not specified - PPO

## 2022-10-19 DIAGNOSIS — E538 Deficiency of other specified B group vitamins: Secondary | ICD-10-CM

## 2022-10-19 MED ORDER — CYANOCOBALAMIN 1000 MCG/ML IJ SOLN
1000.0000 ug | Freq: Once | INTRAMUSCULAR | Status: AC
  Administered 2022-10-19: 1000 ug via INTRAMUSCULAR

## 2022-11-03 ENCOUNTER — Other Ambulatory Visit: Payer: Self-pay | Admitting: Family Medicine

## 2022-11-19 ENCOUNTER — Ambulatory Visit (INDEPENDENT_AMBULATORY_CARE_PROVIDER_SITE_OTHER): Payer: Federal, State, Local not specified - PPO

## 2022-11-19 DIAGNOSIS — E538 Deficiency of other specified B group vitamins: Secondary | ICD-10-CM | POA: Diagnosis not present

## 2022-11-19 MED ORDER — CYANOCOBALAMIN 1000 MCG/ML IJ SOLN
1000.0000 ug | Freq: Once | INTRAMUSCULAR | Status: AC
  Administered 2022-11-19: 1000 ug via INTRAMUSCULAR

## 2022-12-05 ENCOUNTER — Other Ambulatory Visit: Payer: Self-pay | Admitting: Family Medicine

## 2022-12-20 ENCOUNTER — Ambulatory Visit: Payer: Federal, State, Local not specified - PPO

## 2022-12-21 ENCOUNTER — Ambulatory Visit (INDEPENDENT_AMBULATORY_CARE_PROVIDER_SITE_OTHER): Payer: Federal, State, Local not specified - PPO

## 2022-12-21 DIAGNOSIS — E538 Deficiency of other specified B group vitamins: Secondary | ICD-10-CM

## 2022-12-21 MED ORDER — CYANOCOBALAMIN 1000 MCG/ML IJ SOLN
1000.0000 ug | Freq: Once | INTRAMUSCULAR | Status: AC
  Administered 2022-12-21: 1000 ug via INTRAMUSCULAR

## 2023-01-11 ENCOUNTER — Encounter: Payer: Self-pay | Admitting: Family Medicine

## 2023-01-11 ENCOUNTER — Ambulatory Visit: Payer: Federal, State, Local not specified - PPO | Admitting: Family Medicine

## 2023-01-11 VITALS — BP 133/82 | HR 81 | Ht 63.0 in | Wt 192.0 lb

## 2023-01-11 DIAGNOSIS — E559 Vitamin D deficiency, unspecified: Secondary | ICD-10-CM | POA: Diagnosis not present

## 2023-01-11 DIAGNOSIS — I10 Essential (primary) hypertension: Secondary | ICD-10-CM

## 2023-01-11 DIAGNOSIS — K219 Gastro-esophageal reflux disease without esophagitis: Secondary | ICD-10-CM

## 2023-01-11 DIAGNOSIS — E538 Deficiency of other specified B group vitamins: Secondary | ICD-10-CM

## 2023-01-11 DIAGNOSIS — Z23 Encounter for immunization: Secondary | ICD-10-CM

## 2023-01-11 MED ORDER — SEMAGLUTIDE-WEIGHT MANAGEMENT 2.4 MG/0.75ML ~~LOC~~ SOAJ: 2.4000 mg | SUBCUTANEOUS | 5 refills | Status: DC

## 2023-01-11 NOTE — Patient Instructions (Signed)
F/U in 13 weeks on a tueday, call if you need me before  CONGRATS, keep it up!  New higher dose wegovy 2.4 per week  Flu vaccine today  It is important that you exercise regularly at least 30 minutes 5 times a week. If you develop chest pain, have severe difficulty breathing, or feel very tired, stop exercising immediately and seek medical attention   Think about what you will eat, plan ahead. Choose " clean, green, fresh or frozen" over canned, processed or packaged foods which are more sugary, salty and fatty. 70 to 75% of food eaten should be vegetables and fruit. Three meals at set times with snacks allowed between meals, but they must be fruit or vegetables. Aim to eat over a 12 hour period , example 7 am to 7 pm, and STOP after  your last meal of the day. Drink water,generally about 64 ounces per day, no other drink is as healthy. Fruit juice is best enjoyed in a healthy way, by EATING the fruit.   Thanks for choosing [facility], we consider it a privelige to serve you.

## 2023-01-13 ENCOUNTER — Encounter: Payer: Self-pay | Admitting: Family Medicine

## 2023-01-13 DIAGNOSIS — Z23 Encounter for immunization: Secondary | ICD-10-CM | POA: Insufficient documentation

## 2023-01-13 NOTE — Assessment & Plan Note (Signed)
After obtaining informed consent, the influenza vaccine is  administered , with no adverse effect noted at the time of administration.

## 2023-01-13 NOTE — Assessment & Plan Note (Signed)
Controlled, no change in medication  

## 2023-01-13 NOTE — Assessment & Plan Note (Signed)
Excellent response to wegovy, continue same  Patient re-educated about  the importance of commitment to a  minimum of 150 minutes of exercise per week as able.  The importance of healthy food choices with portion control discussed, as well as eating regularly and within a 12 hour window most days. The need to choose "clean , green" food 50 to 75% of the time is discussed, as well as to make water the primary drink and set a goal of 64 ounces water daily.       01/11/2023   10:50 AM 10/11/2022   10:47 AM 10/08/2022   11:12 AM  Weight /BMI  Weight 192 lb 197 lb 0.6 oz 197 lb  Height 5\' 3"  (1.6 m) 5\' 3"  (1.6 m) 5\' 3"  (1.6 m)  BMI 34.01 kg/m2 34.9 kg/m2 34.9 kg/m2    Dose increase to max

## 2023-01-13 NOTE — Assessment & Plan Note (Signed)
Continue monthly supplement

## 2023-01-13 NOTE — Progress Notes (Signed)
Sheri Jones     MRN: [id number]      DOB: [birth year]  Chief Complaint  Patient presents with   Follow-up    Follow up     HPI Sheri Jones is here for follow up and re-evaluation of chronic medical conditions, medication management and review of any available recent lab and radiology data.  Preventive health is updated, specifically  Cancer screening and Immunization.   Questions or concerns regarding consultations or procedures which the PT has had in the interim are  addressed. The PT denies any adverse reactions to current medications since the last visit.  There are no new concerns.  There are no specific complaints   ROS Denies recent fever or chills. Denies sinus pressure, nasal congestion, ear pain or sore throat. Denies chest congestion, productive cough or wheezing. Denies chest pains, palpitations and leg swelling Denies abdominal pain, nausea, vomiting,diarrhea or constipation.   Denies dysuria, frequency, hesitancy or incontinence. Denies joint pain, swelling and limitation in mobility. Denies headaches, seizures, numbness, or tingling. Denies depression, anxiety or insomnia. Denies skin break down or rash.   PE  BP 133/82 (BP Location: Right Arm, Patient Position: Sitting, Cuff Size: Large)   Pulse 81   Ht 5\' 3"  (1.6 m)   Wt 192 lb (87.1 kg)   SpO2 95%   BMI 34.01 kg/m   Patient alert and oriented and in no cardiopulmonary distress.  HEENT: No facial asymmetry, EOMI,     Neck supple .  Chest: Clear to auscultation bilaterally.  CVS: S1, S2 no murmurs, no S3.Regular rate.  ABD: Soft non tender.   Ext: No edema  MS: Adequate ROM spine, shoulders, hips and knees.  Skin: Intact, no ulcerations or rash noted.  Psych: Good eye contact, normal affect. Memory intact not anxious or depressed appearing.  CNS: CN 2-12 intact, power,  normal throughout.no focal deficits noted.   Assessment & Plan  Essential hypertension Controlled, no change in  medication DASH diet and commitment to daily physical activity for a minimum of 30 minutes discussed and encouraged, as a part of hypertension management. The importance of attaining a healthy weight is also discussed.     01/11/2023   10:50 AM 10/11/2022   10:47 AM 10/08/2022   11:12 AM 07/10/2022    3:44 PM 03/20/2022    4:30 PM 03/20/2022   12:50 PM 03/20/2022   12:47 PM  BP/Weight  Systolic BP 133 127 142 117 140 121   Diastolic BP 82 80 63 73 64 61   Wt. (Lbs) 192 197.04 197 210.04   208  BMI 34.01 kg/m2 34.9 kg/m2 34.9 kg/m2 37.21 kg/m2   36.85 kg/m2       Morbid obesity (HCC) Excellent response to wegovy, continue same  Patient re-educated about  the importance of commitment to a  minimum of 150 minutes of exercise per week as able.  The importance of healthy food choices with portion control discussed, as well as eating regularly and within a 12 hour window most days. The need to choose "clean , green" food 50 to 75% of the time is discussed, as well as to make water the primary drink and set a goal of 64 ounces water daily.       01/11/2023   10:50 AM 10/11/2022   10:47 AM 10/08/2022   11:12 AM  Weight /BMI  Weight 192 lb 197 lb 0.6 oz 197 lb  Height 5\' 3"  (1.6 m) 5\' 3"  (1.6 m) 5\' 3"  (  1.6 m)  BMI 34.01 kg/m2 34.9 kg/m2 34.9 kg/m2    Dose increase to max  Vitamin D deficiency Continue daily supplement  Vitamin B12 deficiency disease Continue monthly supplement  GERD (gastroesophageal reflux disease) Controlled, no change in medication   Immunization due After obtaining informed consent, the influenza vaccine is  administered , with no adverse effect noted at the time of administration.

## 2023-01-13 NOTE — Assessment & Plan Note (Signed)
Continue daily supplement

## 2023-01-13 NOTE — Assessment & Plan Note (Signed)
Controlled, no change in medication DASH diet and commitment to daily physical activity for a minimum of 30 minutes discussed and encouraged, as a part of hypertension management. The importance of attaining a healthy weight is also discussed.     01/11/2023   10:50 AM 10/11/2022   10:47 AM 10/08/2022   11:12 AM 07/10/2022    3:44 PM 03/20/2022    4:30 PM 03/20/2022   12:50 PM 03/20/2022   12:47 PM  BP/Weight  Systolic BP 133 127 142 117 140 121   Diastolic BP 82 80 63 73 64 61   Wt. (Lbs) 192 197.04 197 210.04   208  BMI 34.01 kg/m2 34.9 kg/m2 34.9 kg/m2 37.21 kg/m2   36.85 kg/m2

## 2023-01-17 ENCOUNTER — Other Ambulatory Visit: Payer: Self-pay | Admitting: Family Medicine

## 2023-01-21 ENCOUNTER — Ambulatory Visit (INDEPENDENT_AMBULATORY_CARE_PROVIDER_SITE_OTHER): Payer: Federal, State, Local not specified - PPO

## 2023-01-21 DIAGNOSIS — E538 Deficiency of other specified B group vitamins: Secondary | ICD-10-CM | POA: Diagnosis not present

## 2023-01-21 MED ORDER — CYANOCOBALAMIN 1000 MCG/ML IJ SOLN
1000.0000 ug | Freq: Once | INTRAMUSCULAR | Status: AC
  Administered 2023-01-21: 1000 ug via INTRAMUSCULAR

## 2023-01-25 ENCOUNTER — Ambulatory Visit (HOSPITAL_COMMUNITY)
Admission: RE | Admit: 2023-01-25 | Discharge: 2023-01-25 | Disposition: A | Discharge status: Home / Self Care | Payer: Federal, State, Local not specified - PPO | Source: Ambulatory Visit | Attending: Family Medicine | Admitting: Family Medicine

## 2023-01-25 DIAGNOSIS — Z1231 Encounter for screening mammogram for malignant neoplasm of breast: Secondary | ICD-10-CM | POA: Insufficient documentation

## 2023-02-06 ENCOUNTER — Encounter: Payer: Self-pay | Admitting: Family Medicine

## 2023-02-20 ENCOUNTER — Ambulatory Visit (INDEPENDENT_AMBULATORY_CARE_PROVIDER_SITE_OTHER): Payer: Federal, State, Local not specified - PPO

## 2023-02-20 DIAGNOSIS — E538 Deficiency of other specified B group vitamins: Secondary | ICD-10-CM

## 2023-02-20 MED ORDER — CYANOCOBALAMIN 1000 MCG/ML IJ SOLN
1000.0000 ug | Freq: Once | INTRAMUSCULAR | Status: AC
  Administered 2023-02-20: 1000 ug via INTRAMUSCULAR

## 2023-02-20 NOTE — Progress Notes (Signed)
Pt received injection without complications

## 2023-03-19 ENCOUNTER — Other Ambulatory Visit: Payer: Self-pay

## 2023-03-19 ENCOUNTER — Emergency Department (HOSPITAL_BASED_OUTPATIENT_CLINIC_OR_DEPARTMENT_OTHER)
Admission: EM | Admit: 2023-03-19 | Discharge: 2023-03-19 | Disposition: A | Discharge status: Home / Self Care | Payer: Federal, State, Local not specified - PPO | Attending: Emergency Medicine | Admitting: Emergency Medicine

## 2023-03-19 ENCOUNTER — Encounter (HOSPITAL_BASED_OUTPATIENT_CLINIC_OR_DEPARTMENT_OTHER): Payer: Self-pay | Admitting: Urology

## 2023-03-19 ENCOUNTER — Emergency Department (HOSPITAL_BASED_OUTPATIENT_CLINIC_OR_DEPARTMENT_OTHER): Payer: Federal, State, Local not specified - PPO

## 2023-03-19 DIAGNOSIS — Z20822 Contact with and (suspected) exposure to covid-19: Secondary | ICD-10-CM | POA: Insufficient documentation

## 2023-03-19 DIAGNOSIS — R42 Dizziness and giddiness: Secondary | ICD-10-CM | POA: Insufficient documentation

## 2023-03-19 LAB — CBC WITH DIFFERENTIAL/PLATELET
Abs Immature Granulocytes: 0.01 10*3/uL (ref 0.00–0.07)
Basophils Absolute: 0 10*3/uL (ref 0.0–0.1)
Basophils Relative: 0 %
Eosinophils Absolute: 0.1 10*3/uL (ref 0.0–0.5)
Eosinophils Relative: 1 %
HCT: 37.2 % (ref 36.0–46.0)
Hemoglobin: 12.8 g/dL (ref 12.0–15.0)
Immature Granulocytes: 0 %
Lymphocytes Relative: 40 %
Lymphs Abs: 2 10*3/uL (ref 0.7–4.0)
MCH: 31.2 pg (ref 26.0–34.0)
MCHC: 34.4 g/dL (ref 30.0–36.0)
MCV: 90.7 fL (ref 80.0–100.0)
Monocytes Absolute: 0.5 10*3/uL (ref 0.1–1.0)
Monocytes Relative: 11 %
Neutro Abs: 2.4 10*3/uL (ref 1.7–7.7)
Neutrophils Relative %: 48 %
Platelets: 192 10*3/uL (ref 150–400)
RBC: 4.1 MIL/uL (ref 3.87–5.11)
RDW: 15.2 % (ref 11.5–15.5)
WBC: 4.9 10*3/uL (ref 4.0–10.5)
nRBC: 0 % (ref 0.0–0.2)

## 2023-03-19 LAB — COMPREHENSIVE METABOLIC PANEL
ALT: 17 U/L (ref 0–44)
AST: 22 U/L (ref 15–41)
Albumin: 3.8 g/dL (ref 3.5–5.0)
Alkaline Phosphatase: 71 U/L (ref 38–126)
Anion gap: 8 (ref 5–15)
BUN: 17 mg/dL (ref 8–23)
CO2: 29 mmol/L (ref 22–32)
Calcium: 9.5 mg/dL (ref 8.9–10.3)
Chloride: 104 mmol/L (ref 98–111)
Creatinine, Ser: 1.13 mg/dL — ABNORMAL HIGH (ref 0.44–1.00)
GFR, Estimated: 51 mL/min — ABNORMAL LOW (ref >60)
Glucose, Bld: 82 mg/dL (ref 70–99)
Potassium: 3.4 mmol/L — ABNORMAL LOW (ref 3.5–5.1)
Sodium: 141 mmol/L (ref 135–145)
Total Bilirubin: 0.6 mg/dL (ref <1.2)
Total Protein: 6.4 g/dL — ABNORMAL LOW (ref 6.5–8.1)

## 2023-03-19 LAB — RESP PANEL BY RT-PCR (RSV, FLU A&B, COVID)  RVPGX2
Influenza A by PCR: NEGATIVE
Influenza B by PCR: NEGATIVE
Resp Syncytial Virus by PCR: NEGATIVE
SARS Coronavirus 2 by RT PCR: NEGATIVE

## 2023-03-19 MED ORDER — MECLIZINE HCL 25 MG PO TABS: 25.0000 mg | ORAL_TABLET | Freq: Three times a day (TID) | ORAL | 0 refills | Status: AC | PRN

## 2023-03-19 MED ORDER — SODIUM CHLORIDE 0.9 % IV BOLUS
500.0000 mL | Freq: Once | INTRAVENOUS | Status: AC
  Administered 2023-03-19: 500 mL via INTRAVENOUS

## 2023-03-19 MED ORDER — MECLIZINE HCL 25 MG PO TABS
25.0000 mg | ORAL_TABLET | Freq: Once | ORAL | Status: AC
  Administered 2023-03-19: 25 mg via ORAL
  Filled 2023-03-19: qty 1

## 2023-03-19 NOTE — ED Notes (Signed)
Pt. Does live alone and text her neighbor that she wasn't feeling well.

## 2023-03-19 NOTE — ED Provider Notes (Signed)
[place] [facility] AT [facility] Provider Note   CSN: [id number] Arrival date & time: 03/19/23  1326     History  Chief Complaint  Patient presents with   Dizziness    Sheri Jones is a 77 y.o. female.  77 year old female presents today for concern of dizziness.  Has been intermittent since Sunday.  Has had 2 episodes.  Denies any weakness, vision change, balance issues, or paresthesias.  She has not taken anything prior to arrival.  She was driven here by her neighbor who is a retired Engineer, civil (consulting).  Currently she feels well.  No chest pain, shortness of breath.  Denies any sensation of near syncope.  No palpitations.  The history is provided by the patient. No language interpreter was used.       Home Medications Prior to Admission medications   Medication Sig Start Date End Date Taking? Authorizing Provider  meclizine (ANTIVERT) 25 MG tablet Take 1 tablet (25 mg total) by mouth 3 (three) times daily as needed for dizziness. 03/19/23  Yes Christmas Faraci, PA-C  CALCIUM PO Take 2 tablets by mouth daily.    [provider]  cholecalciferol (VITAMIN D3) 25 MCG (1000 UT) tablet Take 1,000 Units by mouth daily.    [provider]  cyanocobalamin (,VITAMIN B-12,) 1000 MCG/ML injection Inject 1,000 mcg into the muscle every 30 (thirty) days.    [provider]  diphenhydrAMINE (BENADRYL) 25 MG tablet Take 25 mg by mouth every 8 (eight) hours as needed for allergies.    [provider]  hydrochlorothiazide (HYDRODIURIL) 25 MG tablet TAKE 1 TABLET BY MOUTH EVERY DAY 12/05/22   Kerri Perches, MD  Multiple Vitamins-Minerals (CENTRUM ULTRA WOMENS PO) Take 1 tablet by mouth daily.    [provider]  pantoprazole (PROTONIX) 20 MG tablet TAKE 1 TABLET (20 MG TOTAL) BY MOUTH DAILY AS NEEDED FOR HEARTBURN OR INDIGESTION. 11/05/22   Kerri Perches, MD  potassium chloride (KLOR-CON) 10 MEQ tablet TAKE 1 TABLET BY MOUTH TWICE A  DAY 01/17/23   Kerri Perches, MD  Semaglutide-Weight Management 2.4 MG/0.75ML SOAJ Inject 2.4 mg into the skin once a week. 01/11/23   Kerri Perches, MD  temazepam (RESTORIL) 7.5 MG capsule Take 1 capsule (7.5 mg total) by mouth at bedtime as needed for sleep. Patient not taking: Reported on 05/19/2019 04/29/19 09/22/19  Kerri Perches, MD      Allergies    Patient has no known allergies.    Review of Systems   Review of Systems  Constitutional:  Negative for chills and fever.  Eyes:  Negative for visual disturbance.  Respiratory:  Negative for cough and shortness of breath.   Neurological:  Positive for dizziness. Negative for facial asymmetry, speech difficulty, weakness, light-headedness, numbness and headaches.  All other systems reviewed and are negative.   Physical Exam Updated Vital Signs BP (!) 147/68   Pulse 71   Temp 99 F (37.2 C)   Resp 19   Ht 5\' 3"  (1.6 m)   Wt 86.2 kg   SpO2 99%   BMI 33.66 kg/m  Physical Exam Vitals and nursing note reviewed.  Constitutional:      General: She is not in acute distress.    Appearance: Normal appearance. She is not ill-appearing.  HENT:     Head: Normocephalic and atraumatic.     Nose: Nose normal.  Eyes:     Conjunctiva/sclera: Conjunctivae normal.  Cardiovascular:  Rate and Rhythm: Normal rate and regular rhythm.     Heart sounds: Normal heart sounds.  Pulmonary:     Effort: Pulmonary effort is normal. No respiratory distress.  Musculoskeletal:        General: No deformity.  Skin:    Findings: No rash.  Neurological:     General: No focal deficit present.     Mental Status: She is alert and oriented to person, place, and time. Mental status is at baseline.     Cranial Nerves: No cranial nerve deficit.     Sensory: No sensory deficit.     Motor: No weakness.     ED Results / Procedures / Treatments   Labs (all labs ordered are listed, but only abnormal results are displayed) Labs Reviewed   COMPREHENSIVE METABOLIC PANEL - Abnormal; Notable for the following components:      Result Value   Potassium 3.4 (*)    Creatinine, Ser 1.13 (*)    Total Protein 6.4 (*)    GFR, Estimated 51 (*)    All other components within normal limits  RESP PANEL BY RT-PCR (RSV, FLU A&B, COVID)  RVPGX2  CBC WITH DIFFERENTIAL/PLATELET    EKG None  Radiology CT Head Wo Contrast  Result Date: 03/19/2023 CLINICAL DATA:  Transient ischemic attack (TIA) EXAM: CT HEAD WITHOUT CONTRAST TECHNIQUE: Contiguous axial images were obtained from the base of the skull through the vertex without intravenous contrast. RADIATION DOSE REDUCTION: This exam was performed according to the departmental dose-optimization program which includes automated exposure control, adjustment of the mA and/or kV according to patient size and/or use of iterative reconstruction technique. COMPARISON:  None Available. FINDINGS: Brain: No hemorrhage. No hydrocephalus. No extra-axial fluid collection. No CT evidence of an acute cortical infarct. No mass effect. Mass lesion. There is a background of mild chronic microvascular ischemic change Vascular: No hyperdense vessel or unexpected calcification. Skull: Normal. Negative for fracture or focal lesion. Sinuses/Orbits: Moderate-to-large bilateral mastoid effusions. No middle ear effusion. Paranasal sinuses are clear. Bilateral lens replacement. Orbits are unremarkable. Other: None. IMPRESSION: 1. No hemorrhage or CT evidence of an acute cortical infarct. 2. Moderate-to-large bilateral mastoid effusions. Electronically Signed   By: Lorenza Cambridge M.D.   On: 03/19/2023 15:03    Procedures Procedures    Medications Ordered in ED Medications  sodium chloride 0.9 % bolus 500 mL (500 mLs Intravenous New Bag/Given 03/19/23 1511)  meclizine (ANTIVERT) tablet 25 mg (25 mg Oral Given 03/19/23 1520)    ED Course/ Medical Decision Making/ A&P                                 Medical Decision  Making Amount and/or Complexity of Data Reviewed Labs: ordered. Radiology: ordered.   77 year old female presents today for concern of dizziness.  Has had 2 episodes since Sunday.  Neurological exam without any focal deficits.  Cranial nerves III through XII intact.  Full range of motion bilateral upper and lower extremities with 5/5 strength in extensor and flexor muscle groups.  History sounds consistent with peripheral cause of dizziness.  Will obtain CT head, labs, provide fluid bolus as well as meclizine and reevaluate.  Reevaluation she reports significant improvement in symptoms.  CT head shows moderate-large mastoid effusion bilaterally. No evidence of mastoiditis.  Respiratory panel negative.  Given the improvement in symptoms.  Mastoid effusions on CT.  Likely peripheral cause of her dizziness.  Discussed with  attending.  Will discharge patient with conservative management and PCP follow-up.  Return precaution discussed.  Patient voices understanding and is in agreement with plan.   Final Clinical Impression(s) / ED Diagnoses Final diagnoses:  Dizziness  Vertigo    Rx / DC Orders ED Discharge Orders          Ordered    meclizine (ANTIVERT) 25 MG tablet  3 times daily PRN        03/19/23 1615              Marita Kansas, PA-C 03/19/23 1620    Ernie Avena, MD 03/20/23 [phone]

## 2023-03-19 NOTE — ED Notes (Signed)
Discharge instructions reviewed with patient. Patient verbalizes understanding, no further questions at this time. Medications/prescriptions and follow up information provided. No acute distress noted at time of departure.  

## 2023-03-19 NOTE — Discharge Instructions (Signed)
Your workup today was reassuring.  CT scan showed fluid behind the ear.  You could take Allegra or Zyrtec in case you have congestion that is leading to the fluid.  Otherwise follow-up with your primary care provider.  I have prescribed you meclizine to help with the dizziness.  Return for any concerning symptoms.

## 2023-03-19 NOTE — ED Triage Notes (Signed)
Pt ambulatory to triage with normal gait  Pt states since Sunday has been feeling lightheaded, blurry vision and dizziness, states has some since congestion as well  Denies N/V  Feels like she has more acid reflux  Denies pain at this time

## 2023-03-19 NOTE — ED Notes (Signed)
Pt. Has gone to CT scan

## 2023-03-19 NOTE — ED Notes (Signed)
Fall risk armband Fall risk sign on door Patient wearing shoes

## 2023-03-25 ENCOUNTER — Ambulatory Visit (INDEPENDENT_AMBULATORY_CARE_PROVIDER_SITE_OTHER): Payer: Federal, State, Local not specified - PPO

## 2023-03-25 DIAGNOSIS — E538 Deficiency of other specified B group vitamins: Secondary | ICD-10-CM

## 2023-03-25 MED ORDER — CYANOCOBALAMIN 1000 MCG/ML IJ SOLN
1000.0000 ug | Freq: Once | INTRAMUSCULAR | Status: AC
  Administered 2023-03-25: 1000 ug via INTRAMUSCULAR

## 2023-04-12 ENCOUNTER — Encounter: Payer: Self-pay | Admitting: Family Medicine

## 2023-04-12 ENCOUNTER — Ambulatory Visit: Payer: Federal, State, Local not specified - PPO | Admitting: Family Medicine

## 2023-04-12 VITALS — BP 129/80 | HR 82 | Ht 63.0 in | Wt 183.1 lb

## 2023-04-12 DIAGNOSIS — E66811 Obesity, class 1: Secondary | ICD-10-CM

## 2023-04-12 DIAGNOSIS — I10 Essential (primary) hypertension: Secondary | ICD-10-CM | POA: Diagnosis not present

## 2023-04-12 DIAGNOSIS — E785 Hyperlipidemia, unspecified: Secondary | ICD-10-CM | POA: Diagnosis not present

## 2023-04-12 MED ORDER — SEMAGLUTIDE-WEIGHT MANAGEMENT 2.4 MG/0.75ML ~~LOC~~ SOAJ: 2.4000 mg | SUBCUTANEOUS | 3 refills | Status: DC

## 2023-04-12 NOTE — Assessment & Plan Note (Signed)
Improved significantly,  contiue med and lifestyle change  Patient re-educated about  the importance of commitment to a  minimum of 150 minutes of exercise per week as able.  The importance of healthy food choices with portion control discussed, as well as eating regularly and within a 12 hour window most days. The need to choose "clean , green" food 50 to 75% of the time is discussed, as well as to make water the primary drink and set a goal of 64 ounces water daily.       04/12/2023   11:18 AM 03/19/2023    1:31 PM 01/11/2023   10:50 AM  Weight /BMI  Weight 183 lb 1.3 oz 190 lb 192 lb  Height 5\' 3"  (1.6 m) 5\' 3"  (1.6 m) 5\' 3"  (1.6 m)  BMI 32.43 kg/m2 33.66 kg/m2 34.01 kg/m2

## 2023-04-12 NOTE — Patient Instructions (Signed)
F/U in 13 weeks, B12 at that visit also, cal if you need me sooner  Keep up great work, losing weight very well  Fasting lipid, chem 7 and EGFr day of next visit, get lab before you see MD  Please help her to log onto My Chart at checkout  It is important that you exercise regularly at least 30 minutes 5 times a week. If you develop chest pain, have severe difficulty breathing, or feel very tired, stop exercising immediately and seek medical attention   Think about what you will eat, plan ahead. Choose " clean, green, fresh or frozen" over canned, processed or packaged foods which are more sugary, salty and fatty. 70 to 75% of food eaten should be vegetables and fruit. Three meals at set times with snacks allowed between meals, but they must be fruit or vegetables. Aim to eat over a 12 hour period , example 7 am to 7 pm, and STOP after  your last meal of the day. Drink water,generally about 64 ounces per day, no other drink is as healthy. Fruit juice is best enjoyed in a healthy way, by EATING the fruit. Thanks for choosing [facility], we consider it a privelige to serve you.

## 2023-04-12 NOTE — Progress Notes (Signed)
Sheri Jones     MRN: [id number]      DOB: 01-17-1946  Chief Complaint  Patient presents with   Follow-up    Follow up recently seen at Urgent care for dizziness    HPI Sheri Jones is here for follow up and re-evaluation of chronic medical conditions, medication management and review of any available recent lab and radiology data.  Preventive health is updated, specifically  Cancer screening and Immunization.   Recent urgent care visit is reviewed , doing much better The PT denies any adverse reactions to current medications since the last visit.  Doing extremely well on wegovy, intends to increase exercise again, not walking as much since weather change ROS Denies recent fever or chills. Denies sinus pressure, nasal congestion, ear pain or sore throat. Denies chest congestion, productive cough or wheezing. Denies chest pains, palpitations and leg swelling Denies abdominal pain, nausea, vomiting,diarrhea or constipation.   Denies dysuria, frequency, hesitancy or incontinence. Denies joint pain, swelling and limitation in mobility. Denies headaches, seizures, numbness, or tingling. Denies depression, anxiety or insomnia. Denies skin break down or rash.   PE  BP 129/80 (BP Location: Right Arm, Patient Position: Sitting, Cuff Size: Large)   Pulse 82   Ht 5\' 3"  (1.6 m)   Wt 183 lb 1.3 oz (83 kg)   SpO2 94%   BMI 32.43 kg/m   Patient alert and oriented and in no cardiopulmonary distress.  HEENT: No facial asymmetry, EOMI,     Neck supple .  Chest: Clear to auscultation bilaterally.  CVS: S1, S2 no murmurs, no S3.Regular rate.  ABD: Soft non tender.   Ext: No edema  MS: Adequate ROM spine, shoulders, hips and knees.  Skin: Intact, no ulcerations or rash noted.  Psych: Good eye contact, normal affect. Memory intact not anxious or depressed appearing.  CNS: CN 2-12 intact, power,  normal throughout.no focal deficits noted.   Assessment & Plan  Essential  hypertension Controlled, no change in medication DASH diet and commitment to daily physical activity for a minimum of 30 minutes discussed and encouraged, as a part of hypertension management. The importance of attaining a healthy weight is also discussed.     04/12/2023   11:18 AM 03/19/2023    4:15 PM 03/19/2023    3:45 PM 03/19/2023    1:32 PM 03/19/2023    1:31 PM 01/11/2023   10:50 AM 10/11/2022   10:47 AM  BP/Weight  Systolic BP 129 135 147 139  133 127  Diastolic BP 80 70 68 101  82 80  Wt. (Lbs) 183.08    190 192 197.04  BMI 32.43 kg/m2    33.66 kg/m2 34.01 kg/m2 34.9 kg/m2       Morbid obesity (HCC) Improved significantly,  contiue med and lifestyle change  Patient re-educated about  the importance of commitment to a  minimum of 150 minutes of exercise per week as able.  The importance of healthy food choices with portion control discussed, as well as eating regularly and within a 12 hour window most days. The need to choose "clean , green" food 50 to 75% of the time is discussed, as well as to make water the primary drink and set a goal of 64 ounces water daily.       04/12/2023   11:18 AM 03/19/2023    1:31 PM 01/11/2023   10:50 AM  Weight /BMI  Weight 183 lb 1.3 oz 190 lb 192 lb  Height 5\' 3"  (  1.6 m) 5\' 3"  (1.6 m) 5\' 3"  (1.6 m)  BMI 32.43 kg/m2 33.66 kg/m2 34.01 kg/m2      Dyslipidemia Hyperlipidemia:Low fat diet discussed and encouraged.   Lipid Panel  Lab Results  Component Value Date   CHOL 193 10/11/2022   HDL 72 10/11/2022   LDLCALC 110 (H) 10/11/2022   TRIG 57 10/11/2022   CHOLHDL 2.7 10/11/2022     Updated lab needed at/ before next visit.

## 2023-04-12 NOTE — Assessment & Plan Note (Signed)
Hyperlipidemia:Low fat diet discussed and encouraged.   Lipid Panel  Lab Results  Component Value Date   CHOL 193 10/11/2022   HDL 72 10/11/2022   LDLCALC 110 (H) 10/11/2022   TRIG 57 10/11/2022   CHOLHDL 2.7 10/11/2022     Updated lab needed at/ before next visit.

## 2023-04-12 NOTE — Assessment & Plan Note (Signed)
Controlled, no change in medication DASH diet and commitment to daily physical activity for a minimum of 30 minutes discussed and encouraged, as a part of hypertension management. The importance of attaining a healthy weight is also discussed.     04/12/2023   11:18 AM 03/19/2023    4:15 PM 03/19/2023    3:45 PM 03/19/2023    1:32 PM 03/19/2023    1:31 PM 01/11/2023   10:50 AM 10/11/2022   10:47 AM  BP/Weight  Systolic BP 129 135 147 139  133 127  Diastolic BP 80 70 68 101  82 80  Wt. (Lbs) 183.08    190 192 197.04  BMI 32.43 kg/m2    33.66 kg/m2 34.01 kg/m2 34.9 kg/m2

## 2023-04-23 ENCOUNTER — Ambulatory Visit (INDEPENDENT_AMBULATORY_CARE_PROVIDER_SITE_OTHER): Payer: Federal, State, Local not specified - PPO

## 2023-04-23 DIAGNOSIS — E538 Deficiency of other specified B group vitamins: Secondary | ICD-10-CM | POA: Diagnosis not present

## 2023-04-23 MED ORDER — CYANOCOBALAMIN 1000 MCG/ML IJ SOLN
1000.0000 ug | Freq: Once | INTRAMUSCULAR | Status: AC
  Administered 2023-04-23: 1000 ug via INTRAMUSCULAR

## 2023-04-30 ENCOUNTER — Telehealth: Payer: Self-pay

## 2023-04-30 NOTE — Telephone Encounter (Signed)
 Copied from CRM [id number]. Topic: Clinical - Prescription Issue >> Apr 29, 2023  4:02 PM Elle L wrote: Reason for CRM: The patient states she went to pick up her Semaglutide  2.4 MG/0.75ML SOAJ and it went from $24.99 to $648 and she needs assistance. Her call back number is [phone] or [phone]. She also gave me the medication manufacture's number [phone] and states that it went from tier one to tier three and that is why the price has gone up. She will need her dosage on Thursday and is out of the medication.

## 2023-05-23 ENCOUNTER — Ambulatory Visit (INDEPENDENT_AMBULATORY_CARE_PROVIDER_SITE_OTHER): Payer: Federal, State, Local not specified - PPO

## 2023-05-23 DIAGNOSIS — E538 Deficiency of other specified B group vitamins: Secondary | ICD-10-CM

## 2023-05-23 MED ORDER — CYANOCOBALAMIN 1000 MCG/ML IJ SOLN
1000.0000 ug | Freq: Once | INTRAMUSCULAR | Status: AC
  Administered 2023-05-23: 1000 ug via INTRAMUSCULAR

## 2023-05-23 NOTE — Progress Notes (Signed)
Pt. Came in for monthly scheduled B-12 shot . Declined vitals  Pt received injection without complications.   Pt. Stated she is having pain in middle finger of right hand. She is requesting medication for pain or scan to see if there are any issues.

## 2023-06-01 ENCOUNTER — Encounter: Payer: Self-pay | Admitting: Family Medicine

## 2023-06-21 ENCOUNTER — Encounter: Payer: Self-pay | Admitting: Orthopedic Surgery

## 2023-06-21 ENCOUNTER — Ambulatory Visit: Payer: Federal, State, Local not specified - PPO | Admitting: Orthopedic Surgery

## 2023-06-21 ENCOUNTER — Ambulatory Visit (INDEPENDENT_AMBULATORY_CARE_PROVIDER_SITE_OTHER): Payer: Federal, State, Local not specified - PPO

## 2023-06-21 DIAGNOSIS — M65331 Trigger finger, right middle finger: Secondary | ICD-10-CM | POA: Diagnosis not present

## 2023-06-21 DIAGNOSIS — E538 Deficiency of other specified B group vitamins: Secondary | ICD-10-CM | POA: Diagnosis not present

## 2023-06-21 MED ORDER — CYANOCOBALAMIN 1000 MCG/ML IJ SOLN
1000.0000 ug | Freq: Once | INTRAMUSCULAR | Status: AC
Start: 2023-06-21 — End: 2023-06-21
  Administered 2023-06-21: 1000 ug via INTRAMUSCULAR

## 2023-06-21 MED ORDER — METHYLPREDNISOLONE ACETATE 40 MG/ML IJ SUSP
40.0000 mg | Freq: Once | INTRAMUSCULAR | Status: AC
  Administered 2023-06-21: 40 mg via INTRA_ARTICULAR

## 2023-06-21 NOTE — Progress Notes (Signed)
 Chief Complaint  Patient presents with   Injections    Right middle finger    Trigger finger injection  Diagnosis  middle finger triggering  Procedure injection A1 pulley Medications lidocaine 1% 1 mL and Depo-Medrol 40 mg 1 mL Skin prep alcohol and ethyl chloride Verbal consent was obtained Timeout confirmed the injection site  After cleaning the skin with alcohol and anesthetizing the skin with ethyl chloride the A1 pulley was palpated and the injection was performed without complication

## 2023-07-12 ENCOUNTER — Encounter: Payer: Self-pay | Admitting: Family Medicine

## 2023-07-12 ENCOUNTER — Ambulatory Visit: Payer: Federal, State, Local not specified - PPO | Admitting: Family Medicine

## 2023-07-12 VITALS — BP 133/82 | HR 75 | Resp 16 | Ht 63.0 in | Wt 184.1 lb

## 2023-07-12 DIAGNOSIS — E538 Deficiency of other specified B group vitamins: Secondary | ICD-10-CM | POA: Diagnosis not present

## 2023-07-12 DIAGNOSIS — I10 Essential (primary) hypertension: Secondary | ICD-10-CM

## 2023-07-12 DIAGNOSIS — E785 Hyperlipidemia, unspecified: Secondary | ICD-10-CM

## 2023-07-12 DIAGNOSIS — E66811 Obesity, class 1: Secondary | ICD-10-CM

## 2023-07-12 DIAGNOSIS — J302 Other seasonal allergic rhinitis: Secondary | ICD-10-CM

## 2023-07-12 MED ORDER — AZELASTINE HCL 0.1 % NA SOLN: 2.0000 | Freq: Two times a day (BID) | NASAL | 5 refills | Status: AC

## 2023-07-12 MED ORDER — SEMAGLUTIDE-WEIGHT MANAGEMENT 2.4 MG/0.75ML ~~LOC~~ SOAJ: 2.4000 mg | SUBCUTANEOUS | 3 refills | Status: DC

## 2023-07-12 NOTE — Assessment & Plan Note (Signed)
 Controlled, no change in medication DASH diet and commitment to daily physical activity for a minimum of 30 minutes discussed and encouraged, as a part of hypertension management. The importance of attaining a healthy weight is also discussed.     07/12/2023   10:16 AM 04/12/2023   11:18 AM 03/19/2023    4:15 PM 03/19/2023    3:45 PM 03/19/2023    1:32 PM 03/19/2023    1:31 PM 01/11/2023   10:50 AM  BP/Weight  Systolic BP 133 129 135 147 139  133  Diastolic BP 82 80 70 68 101  82  Wt. (Lbs) 184.12 183.08    190 192  BMI 32.62 kg/m2 32.43 kg/m2    33.66 kg/m2 34.01 kg/m2

## 2023-07-12 NOTE — Patient Instructions (Addendum)
 F/U in 9 weeks, B12 at that visit , call if you need me sooner  Nurse visit in 1 week for B12   ASTELIN SPRAY IS PRESCRIBED FOR YOUR ALLERGIES  Ozempic ic covered through your mail order at the affordable price of less than $130 for THREE month supply, we are faxing in prescription, (after we hear back from [employer], and verify this is the case and make arrangements to follow through  It is important that you exercise regularly at least 30 minutes 5 times a week. If you develop chest pain, have severe difficulty breathing, or feel very tired, stop exercising immediately and seek medical attention   Thanks for choosing [facility], we consider it a privelige to serve you.

## 2023-07-12 NOTE — Progress Notes (Signed)
 Sheri Jones     MRN: [id number]      DOB: Jan 01, 1946  Chief Complaint  Patient presents with   Hypertension    Follow up visit     HPI Sheri Jones is here for follow up and re-evaluation of chronic medical conditions, medication management and review of any available recent lab and radiology data.  Preventive health is updated, specifically  Cancer screening and Immunization.   Questions or concerns regarding consultations or procedures which the PT has had in the interim are  addressed. C/o inability to afford semaglutide up to $600/month, checked at visit and through mail order down to approx $50/month C/o increased allergy symptoms , sneezing, stuffy nose in past 3 to 4 weeks, no fever or chills, occassional headache  ROS Denies recent fever or chills. Deniesear pain or sore throat. Denies chest congestion, productive cough or wheezing. Denies chest pains, palpitations and leg swelling Denies abdominal pain, nausea, vomiting,diarrhea or constipation.   Denies dysuria, frequency, hesitancy or incontinence. Denies joint pain, swelling and limitation in mobility. Denies , seizures, numbness, or tingling. Denies depression, anxiety or insomnia. Denies skin break down or rash.   PE  BP 133/82   Pulse 75   Resp 16   Ht 5\' 3"  (1.6 m)   Wt 184 lb 1.9 oz (83.5 kg)   SpO2 96%   BMI 32.62 kg/m   Patient alert and oriented and in no cardiopulmonary distress.  HEENT: No facial asymmetry, EOMI,     Neck supple .  Chest: Clear to auscultation bilaterally.  CVS: S1, S2 no murmurs, no S3.Regular rate.  ABD: Soft non tender.   Ext: No edema  MS: Adequate ROM spine, shoulders, hips and knees.  Skin: Intact, no ulcerations or rash noted.  Psych: Good eye contact, normal affect. Memory intact not anxious or depressed appearing.  CNS: CN 2-12 intact, power,  normal throughout.no focal deficits noted.   Assessment & Plan  Essential hypertension Controlled, no change in  medication DASH diet and commitment to daily physical activity for a minimum of 30 minutes discussed and encouraged, as a part of hypertension management. The importance of attaining a healthy weight is also discussed.     07/12/2023   10:16 AM 04/12/2023   11:18 AM 03/19/2023    4:15 PM 03/19/2023    3:45 PM 03/19/2023    1:32 PM 03/19/2023    1:31 PM 01/11/2023   10:50 AM  BP/Weight  Systolic BP 133 129 135 147 139  133  Diastolic BP 82 80 70 68 101  82  Wt. (Lbs) 184.12 183.08    190 192  BMI 32.62 kg/m2 32.43 kg/m2    33.66 kg/m2 34.01 kg/m2       Obesity (BMI 30.0-34.9)  Patient re-educated about  the importance of commitment to a  minimum of 150 minutes of exercise per week as able.  The importance of healthy food choices with portion control discussed, as well as eating regularly and within a 12 hour window most days. The need to choose "clean , green" food 50 to 75% of the time is discussed, as well as to make water the primary drink and set a goal of 64 ounces water daily.       07/12/2023   10:16 AM 04/12/2023   11:18 AM 03/19/2023    1:31 PM  Weight /BMI  Weight 184 lb 1.9 oz 183 lb 1.3 oz 190 lb  Height 5\' 3"  (1.6 m) 5\' 3"  (1.6  m) 5\' 3"  (1.6 m)  BMI 32.62 kg/m2 32.43 kg/m2 33.66 kg/m2    Needs to continue semaglutide , rx to be faxed once pt confirms speaking wit her ins  Chronic seasonal allergic rhinitis Currently having increased symptoms, use Astellin which is prescribed for symptom control  Vitamin B12 deficiency disease B12 due in 1 week, pt to return for that  Dyslipidemia Hyperlipidemia:Low fat diet discussed and encouraged.   Lipid Panel  Lab Results  Component Value Date   CHOL 193 10/11/2022   HDL 72 10/11/2022   LDLCALC 110 (H) 10/11/2022   TRIG 57 10/11/2022   CHOLHDL 2.7 10/11/2022

## 2023-07-12 NOTE — Assessment & Plan Note (Signed)
 B12 due in 1 week, pt to return for that

## 2023-07-12 NOTE — Assessment & Plan Note (Addendum)
 Currently having increased symptoms, use Astellin which is prescribed for symptom control

## 2023-07-12 NOTE — Assessment & Plan Note (Signed)
 Hyperlipidemia:Low fat diet discussed and encouraged.   Lipid Panel  Lab Results  Component Value Date   CHOL 193 10/11/2022   HDL 72 10/11/2022   LDLCALC 110 (H) 10/11/2022   TRIG 57 10/11/2022   CHOLHDL 2.7 10/11/2022

## 2023-07-12 NOTE — Assessment & Plan Note (Signed)
  Patient re-educated about  the importance of commitment to a  minimum of 150 minutes of exercise per week as able.  The importance of healthy food choices with portion control discussed, as well as eating regularly and within a 12 hour window most days. The need to choose "clean , green" food 50 to 75% of the time is discussed, as well as to make water the primary drink and set a goal of 64 ounces water daily.       07/12/2023   10:16 AM 04/12/2023   11:18 AM 03/19/2023    1:31 PM  Weight /BMI  Weight 184 lb 1.9 oz 183 lb 1.3 oz 190 lb  Height 5\' 3"  (1.6 m) 5\' 3"  (1.6 m) 5\' 3"  (1.6 m)  BMI 32.62 kg/m2 32.43 kg/m2 33.66 kg/m2    Needs to continue semaglutide , rx to be faxed once pt confirms speaking wit her ins

## 2023-07-13 ENCOUNTER — Encounter: Payer: Self-pay | Admitting: Family Medicine

## 2023-07-13 LAB — BMP8+EGFR
BUN/Creatinine Ratio: 16 (ref 12–28)
BUN: 15 mg/dL (ref 8–27)
CO2: 24 mmol/L (ref 20–29)
Calcium: 8.9 mg/dL (ref 8.7–10.3)
Chloride: 105 mmol/L (ref 96–106)
Creatinine, Ser: 0.93 mg/dL (ref 0.57–1.00)
Glucose: 84 mg/dL (ref 70–99)
Potassium: 3.8 mmol/L (ref 3.5–5.2)
Sodium: 145 mmol/L — ABNORMAL HIGH (ref 134–144)
eGFR: 64 mL/min/{1.73_m2} (ref >59)

## 2023-07-13 LAB — LIPID PANEL
Chol/HDL Ratio: 2.1 ratio (ref 0.0–4.4)
Cholesterol, Total: 180 mg/dL (ref 100–199)
HDL: 85 mg/dL (ref >39)
LDL Chol Calc (NIH): 84 mg/dL (ref 0–99)
Triglycerides: 54 mg/dL (ref 0–149)
VLDL Cholesterol Cal: 11 mg/dL (ref 5–40)

## 2023-07-14 ENCOUNTER — Other Ambulatory Visit: Payer: Self-pay | Admitting: Family Medicine

## 2023-07-15 ENCOUNTER — Encounter: Payer: Self-pay | Admitting: Family Medicine

## 2023-07-16 ENCOUNTER — Telehealth: Payer: Self-pay | Admitting: Pharmacy Technician

## 2023-07-16 ENCOUNTER — Other Ambulatory Visit (HOSPITAL_COMMUNITY): Payer: Self-pay

## 2023-07-16 ENCOUNTER — Encounter (HOSPITAL_COMMUNITY): Payer: Self-pay | Admitting: Hematology

## 2023-07-16 MED ORDER — ORLISTAT 120 MG PO CAPS: 120.0000 mg | ORAL_CAPSULE | Freq: Three times a day (TID) | ORAL | 5 refills | Status: DC

## 2023-07-16 NOTE — Telephone Encounter (Signed)
 Pharmacy Patient Advocate Encounter   Received notification from CoverMyMeds that prior authorization for Orlistat 120MG  capsules is required/requested.   Insurance verification completed.   The patient is insured through [facility] .   Per test claim: PA required; PA submitted to above mentioned insurance via CoverMyMeds Key/confirmation #/EOC [id number] Status is pending

## 2023-07-17 ENCOUNTER — Other Ambulatory Visit (HOSPITAL_COMMUNITY): Payer: Self-pay

## 2023-07-17 NOTE — Telephone Encounter (Signed)
 Pharmacy Patient Advocate Encounter  Received notification from [facility] that Prior Authorization for Orlistat 120MG  capsules has been APPROVED from 07/16/2023 to 01/12/2024. Unable to obtain price due to refill too soon rejection, last fill date 07/17/2023 next available fill date04/18/2025   PA #/Case ID/Reference #: [id number]

## 2023-07-19 ENCOUNTER — Ambulatory Visit (INDEPENDENT_AMBULATORY_CARE_PROVIDER_SITE_OTHER)

## 2023-07-19 ENCOUNTER — Ambulatory Visit: Payer: Federal, State, Local not specified - PPO

## 2023-07-19 DIAGNOSIS — E538 Deficiency of other specified B group vitamins: Secondary | ICD-10-CM | POA: Diagnosis not present

## 2023-07-19 MED ORDER — CYANOCOBALAMIN 1000 MCG/ML IJ SOLN
1000.0000 ug | Freq: Once | INTRAMUSCULAR | Status: AC
Start: 2023-07-19 — End: 2023-07-19
  Administered 2023-07-19: 1000 ug via INTRAMUSCULAR

## 2023-07-19 NOTE — Progress Notes (Signed)
 Pt in to receive monthly b12 injection

## 2023-08-19 ENCOUNTER — Ambulatory Visit (INDEPENDENT_AMBULATORY_CARE_PROVIDER_SITE_OTHER)

## 2023-08-19 DIAGNOSIS — E538 Deficiency of other specified B group vitamins: Secondary | ICD-10-CM | POA: Diagnosis not present

## 2023-08-19 MED ORDER — CYANOCOBALAMIN 1000 MCG/ML IJ SOLN
1000.0000 ug | Freq: Once | INTRAMUSCULAR | Status: AC
  Administered 2023-08-19: 1000 ug via INTRAMUSCULAR

## 2023-08-23 ENCOUNTER — Other Ambulatory Visit: Payer: Self-pay | Admitting: Family Medicine

## 2023-09-18 ENCOUNTER — Ambulatory Visit (INDEPENDENT_AMBULATORY_CARE_PROVIDER_SITE_OTHER)

## 2023-09-18 DIAGNOSIS — E538 Deficiency of other specified B group vitamins: Secondary | ICD-10-CM | POA: Diagnosis not present

## 2023-09-18 MED ORDER — CYANOCOBALAMIN 1000 MCG/ML IJ SOLN
1000.0000 ug | Freq: Once | INTRAMUSCULAR | Status: AC
  Administered 2023-09-18: 1000 ug via INTRAMUSCULAR

## 2023-09-18 NOTE — Progress Notes (Signed)
 Patient is in office today for a nurse visit for B12 Injection. Patient Injection was given in the  Left deltoid. Patient tolerated injection well.

## 2023-09-24 ENCOUNTER — Ambulatory Visit (INDEPENDENT_AMBULATORY_CARE_PROVIDER_SITE_OTHER): Payer: Self-pay | Admitting: Family Medicine

## 2023-09-24 ENCOUNTER — Other Ambulatory Visit (HOSPITAL_COMMUNITY): Payer: Self-pay | Admitting: Family Medicine

## 2023-09-24 ENCOUNTER — Encounter: Payer: Self-pay | Admitting: Family Medicine

## 2023-09-24 VITALS — BP 128/80 | HR 72 | Resp 14 | Ht 63.0 in | Wt 184.0 lb

## 2023-09-24 DIAGNOSIS — E559 Vitamin D deficiency, unspecified: Secondary | ICD-10-CM | POA: Diagnosis not present

## 2023-09-24 DIAGNOSIS — I10 Essential (primary) hypertension: Secondary | ICD-10-CM

## 2023-09-24 DIAGNOSIS — M8588 Other specified disorders of bone density and structure, other site: Secondary | ICD-10-CM

## 2023-09-24 DIAGNOSIS — Z1231 Encounter for screening mammogram for malignant neoplasm of breast: Secondary | ICD-10-CM

## 2023-09-24 DIAGNOSIS — E538 Deficiency of other specified B group vitamins: Secondary | ICD-10-CM

## 2023-09-24 DIAGNOSIS — E66811 Obesity, class 1: Secondary | ICD-10-CM | POA: Diagnosis not present

## 2023-09-24 NOTE — Progress Notes (Unsigned)
   Sheri Jones     MRN: [id number]      DOB: 05-10-1945  Chief Complaint  Patient presents with   Hypertension    9 week follow up    Weight Gain    Pt states she has stopped the orlistat  because she thinks it is not working     HPI Sheri Jones is here for follow up and re-evaluation of chronic medical conditions, medication management and review of any available recent lab and radiology data.  Preventive health is updated, specifically  Cancer screening and Immunization.   Questions or concerns regarding consultations or procedures which the PT has had in the interim are  addressed. The PT denies any adverse reactions to current medications since the last visit.  Disappointed that she has not lost weight but does report taking inconsistently due to "accidents" Has great exerciise routine walks 1 mile at least 4 days per week ROS Denies recent fever or chills. Denies sinus pressure, nasal congestion, ear pain or sore throat. Denies chest congestion, productive cough or wheezing. Denies chest pains, palpitations and leg swelling Denies abdominal pain, nausea, vomiting,diarrhea or constipation.   Denies dysuria, frequency, hesitancy or incontinence. Denies uncontrolled  joint pain, swelling and limitation in mobility. Denies headaches, seizures, numbness, or tingling. Denies depression, anxiety or insomnia. Denies skin break down or rash.   PE  There were no vitals taken for this visit.  Patient alert and oriented and in no cardiopulmonary distress.  HEENT: No facial asymmetry, EOMI,     Neck supple .  Chest: Clear to auscultation bilaterally.  CVS: S1, S2 no murmurs, no S3.Regular rate.  ABD: Soft non tender.   Ext: No edema  MS: Adequate ROM spine, shoulders, hips and knees.  Skin: Intact, no ulcerations or rash noted.  Psych: Good eye contact, normal affect. Memory intact not anxious or depressed appearing.  CNS: CN 2-12 intact, power,  normal throughout.no  focal deficits noted.   Assessment & Plan  No problem-specific Assessment & Plan notes found for this encounter.

## 2023-09-24 NOTE — Patient Instructions (Addendum)
 Annual exam in early September, call if you need mne sooner  No medication changes  Pls schedule mammogram at checkout  Fasting cmp and EGFR, CBC, tSH and vit D 3 to 7 dfays before September appt  You need to take medication every day as prescribed for weight loss  Keep up great exercise habits  Thanks for choosing [facility], we consider it a privelige to serve you.

## 2023-09-25 NOTE — Assessment & Plan Note (Addendum)
  Continue calcium  with d supplement and daily weight bearing exercise     09/24/2023   11:34 PM 07/12/2023   10:16 AM 04/12/2023   11:18 AM  Weight /BMI  Weight 184 lb 184 lb 1.9 oz 183 lb 1.3 oz  Height 5\' 3"  (1.6 m) 5\' 3"  (1.6 m) 5\' 3"  (1.6 m)  BMI 32.59 kg/m2 32.62 kg/m2 32.43 kg/m2    No change, non compliant with treatment

## 2023-09-25 NOTE — Assessment & Plan Note (Signed)
  Patient re-educated about  the importance of commitment to a  minimum of 150 minutes of exercise per week as able.  The importance of healthy food choices with portion control discussed, as well as eating regularly and within a 12 hour window most days. The need to choose "clean , green" food 50 to 75% of the time is discussed, as well as to make water  the primary drink and set a goal of 64 ounces water  daily.       09/24/2023   11:34 PM 07/12/2023   10:16 AM 04/12/2023   11:18 AM  Weight /BMI  Weight 184 lb 184 lb 1.9 oz 183 lb 1.3 oz  Height 5\' 3"  (1.6 m) 5\' 3"  (1.6 m) 5\' 3"  (1.6 m)  BMI 32.59 kg/m2 32.62 kg/m2 32.43 kg/m2    Needs to comply with medication so behavioral changes occur for weight loss

## 2023-09-25 NOTE — Assessment & Plan Note (Signed)
 Controlled, no change in medication DASH diet and commitment to daily physical activity for a minimum of 30 minutes discussed and encouraged, as a part of hypertension management. The importance of attaining a healthy weight is also discussed.     09/24/2023   11:34 PM 07/12/2023   10:16 AM 04/12/2023   11:18 AM 03/19/2023    4:15 PM 03/19/2023    3:45 PM 03/19/2023    1:32 PM 03/19/2023    1:31 PM  BP/Weight  Systolic BP 128 133 129 135 147 139   Diastolic BP 80 82 80 70 68 101   Wt. (Lbs) 184 184.12 183.08    190  BMI 32.59 kg/m2 32.62 kg/m2 32.43 kg/m2    33.66 kg/m2

## 2023-09-25 NOTE — Assessment & Plan Note (Signed)
 Receiving monthly injections in the office

## 2023-10-18 ENCOUNTER — Ambulatory Visit (INDEPENDENT_AMBULATORY_CARE_PROVIDER_SITE_OTHER)

## 2023-10-18 DIAGNOSIS — E538 Deficiency of other specified B group vitamins: Secondary | ICD-10-CM

## 2023-10-18 MED ORDER — CYANOCOBALAMIN 1000 MCG/ML IJ SOLN
1000.0000 ug | Freq: Once | INTRAMUSCULAR | Status: AC
  Administered 2023-10-18: 1000 ug via INTRAMUSCULAR

## 2023-10-18 NOTE — Progress Notes (Signed)
 Patient is in office today for a nurse visit for B12 Injection. Patient Injection was given in the  Left deltoid. Patient tolerated injection well.

## 2023-11-19 ENCOUNTER — Ambulatory Visit (INDEPENDENT_AMBULATORY_CARE_PROVIDER_SITE_OTHER)

## 2023-11-19 DIAGNOSIS — E538 Deficiency of other specified B group vitamins: Secondary | ICD-10-CM

## 2023-11-19 MED ORDER — CYANOCOBALAMIN 1000 MCG/ML IJ SOLN
1000.0000 ug | Freq: Once | INTRAMUSCULAR | Status: AC
  Administered 2023-11-19: 1000 ug via INTRAMUSCULAR

## 2023-11-19 NOTE — Progress Notes (Signed)
 Patient is in office today for a nurse visit for B12 Injection. Patient Injection was given in the  Left deltoid. Patient tolerated injection well.

## 2023-12-20 ENCOUNTER — Ambulatory Visit (INDEPENDENT_AMBULATORY_CARE_PROVIDER_SITE_OTHER)

## 2023-12-20 DIAGNOSIS — E538 Deficiency of other specified B group vitamins: Secondary | ICD-10-CM | POA: Diagnosis not present

## 2023-12-20 MED ORDER — CYANOCOBALAMIN 1000 MCG/ML IJ SOLN
1000.0000 ug | Freq: Once | INTRAMUSCULAR | Status: AC
  Administered 2023-12-20: 1000 ug via INTRAMUSCULAR

## 2023-12-20 NOTE — Progress Notes (Signed)
 Patient is in office today for a nurse visit for B12 Injection. Patient Injection was given in the  Left deltoid. Patient tolerated injection well.

## 2023-12-20 NOTE — Progress Notes (Signed)
 I have reviewed and agree with the documentation. Milus Mallick. Lodema Hong, MD [facility] Location Provider: office Location Patient: home

## 2023-12-20 NOTE — Patient Instructions (Signed)
 Return in 1 month for next b12 injection

## 2023-12-26 ENCOUNTER — Ambulatory Visit: Payer: Self-pay

## 2023-12-26 NOTE — Telephone Encounter (Signed)
 Copied from CRM #[id number]. Topic: Clinical - Red Word Triage >> Dec 26, 2023  8:34 AM Marissa P wrote: Red Word that prompted transfer to Nurse Triage: Patient is not feeling too good, itchy tickle like feeling in throat, body aches and slight cough would like to be seen today if possible. Reason for Disposition  [1] SEVERE sore throat AND [2] present > 24 hours  Answer Assessment - Initial Assessment Questions 1. ONSET: When did the nasal discharge start?      yesterday 2. AMOUNT: How much discharge is there?      Denies nasal discharge 3. COUGH: Do you have a cough? If Yes, ask: Describe the color of your mucus. (e.g., clear, white, yellow, green)     Yes, dry cough 4. RESPIRATORY DISTRESS: Describe your breathing.      denies 5. FEVER: Do you have a fever? If Yes, ask: What is your temperature, how was it measured, and when did it start?     denies 6. SEVERITY: Overall, how bad are you feeling right now? (e.g., doesn't interfere with normal activities, staying home from school/work, staying in bed)      Affecting ADL 7. OTHER SYMPTOMS: Do you have any other symptoms? (e.g., earache, mouth sores, sore throat, wheezing)     Body aches, scratchy throat 8. PREGNANCY: Is there any chance you are pregnant? When was your last menstrual period?     N/a    [facility] attempted to schedule with PCP, but no access. Pt stated that she will just go to UC. [facility] offered to schedule with [facility], but pt declines and states she will just walk in.  Protocols used: Common Cold-A-AH

## 2023-12-26 NOTE — Telephone Encounter (Signed)
Went to [place]

## 2023-12-31 ENCOUNTER — Telehealth: Payer: Self-pay | Admitting: Family Medicine

## 2023-12-31 NOTE — Telephone Encounter (Signed)
 Copied from CRM [id number]. Topic: General - Other >> Dec 31, 2023  2:22 PM Ameerah G wrote: Reason for CRM: Patient is calling for a Covid vaccine prescription and patient has some concerns about her weight and would like the provider to call her back to discuss weight loss medication. Please advise [phone]  [id number] [address], [place] - [address] [place] [address] Phone: [phone] Fax: [phone]

## 2024-01-02 ENCOUNTER — Encounter: Payer: Self-pay | Admitting: Family Medicine

## 2024-01-03 ENCOUNTER — Other Ambulatory Visit: Payer: Self-pay

## 2024-01-03 DIAGNOSIS — Z23 Encounter for immunization: Secondary | ICD-10-CM

## 2024-01-03 MED ORDER — SPIKEVAX 50 MCG/0.5ML IM SUSY: 0.5000 mL | PREFILLED_SYRINGE | Freq: Once | INTRAMUSCULAR | 0 refills | Status: AC

## 2024-01-03 NOTE — Telephone Encounter (Signed)
Rx has been sent. My chart message sent to patient.

## 2024-01-10 ENCOUNTER — Other Ambulatory Visit: Payer: Self-pay | Admitting: Family Medicine

## 2024-01-15 ENCOUNTER — Encounter: Admitting: Family Medicine

## 2024-01-20 ENCOUNTER — Ambulatory Visit (INDEPENDENT_AMBULATORY_CARE_PROVIDER_SITE_OTHER)

## 2024-01-20 DIAGNOSIS — E538 Deficiency of other specified B group vitamins: Secondary | ICD-10-CM

## 2024-01-20 MED ORDER — CYANOCOBALAMIN 1000 MCG/ML IJ SOLN
1000.0000 ug | Freq: Once | INTRAMUSCULAR | Status: AC
  Administered 2024-01-20: 1000 ug via INTRAMUSCULAR

## 2024-01-20 NOTE — Progress Notes (Signed)
 Patient is in office today for a nurse visit for B12 Injection. Patient Injection was given in the  Left deltoid. Patient tolerated injection well.

## 2024-02-16 ENCOUNTER — Other Ambulatory Visit: Payer: Self-pay | Admitting: Family Medicine

## 2024-02-17 ENCOUNTER — Ambulatory Visit (HOSPITAL_COMMUNITY)
Admission: RE | Admit: 2024-02-17 | Discharge: 2024-02-17 | Disposition: A | Discharge status: Home / Self Care | Source: Ambulatory Visit | Attending: Family Medicine | Admitting: Family Medicine

## 2024-02-17 DIAGNOSIS — Z1231 Encounter for screening mammogram for malignant neoplasm of breast: Secondary | ICD-10-CM | POA: Insufficient documentation

## 2024-02-19 ENCOUNTER — Encounter: Payer: Self-pay | Admitting: Family Medicine

## 2024-02-19 ENCOUNTER — Ambulatory Visit (INDEPENDENT_AMBULATORY_CARE_PROVIDER_SITE_OTHER)

## 2024-02-19 ENCOUNTER — Ambulatory Visit: Admitting: Family Medicine

## 2024-02-19 VITALS — BP 114/73 | HR 63 | Temp 97.8°F | Ht 63.0 in | Wt 196.0 lb

## 2024-02-19 DIAGNOSIS — J3489 Other specified disorders of nose and nasal sinuses: Secondary | ICD-10-CM

## 2024-02-19 DIAGNOSIS — E538 Deficiency of other specified B group vitamins: Secondary | ICD-10-CM | POA: Diagnosis not present

## 2024-02-19 MED ORDER — CYANOCOBALAMIN 1000 MCG/ML IJ SOLN
1000.0000 ug | Freq: Once | INTRAMUSCULAR | Status: AC
  Administered 2024-02-19: 1000 ug via INTRAMUSCULAR

## 2024-02-19 NOTE — Progress Notes (Signed)
 Patient is in office today for a nurse visit for B12 Injection. Patient Injection was given in the  Left deltoid. Patient tolerated injection well.

## 2024-02-19 NOTE — Progress Notes (Signed)
   Subjective:    Patient ID: Sheri Jones, female    DOB: 01/08/1946, 78 y.o.   MRN: [id number]  HPI Right ear infection follow up  Went to urgent care and dentist - was amoxicillin still taking tid  Still having light pressure and pain in r ear Very nice lady Relates that she felt a discomfort in the right maxillary right ear region First she went to her dentist who did x-rays and told her there was no infection Then she went to urgent care that diagnosed her with a possible sinus infection versus ear infection They irrigated her ears They put her on amoxicillin She states she is starting to feel a little bit better She is improving to some degree  Review of Systems     Objective:   Physical Exam She points to the right maxillary and right ear region for her discomfort there is no signs of any blistering Eardrums appear normal bilateral no wax buildup She does have evidence of previous dental issues but no obvious infection noted on exam Neck no masses Lungs are clear hearts regular       Assessment & Plan:  Persistent right ear/right sinus/right facial pain-finish out antibiotics If not doing dramatically better over the next 7 to 10 days next that would be referral to ENT Patient can message us  and we will help set up the referral No need to do any lab work or x-rays currently   I did introduce to the patient the idea that it still could be a dental issue and may need to read Tishie Altmann by her dentist if the ENT does not see anything specific should her pain persist

## 2024-02-20 ENCOUNTER — Ambulatory Visit: Payer: Self-pay

## 2024-03-18 ENCOUNTER — Ambulatory Visit (INDEPENDENT_AMBULATORY_CARE_PROVIDER_SITE_OTHER)

## 2024-03-18 DIAGNOSIS — E538 Deficiency of other specified B group vitamins: Secondary | ICD-10-CM

## 2024-03-18 MED ORDER — CYANOCOBALAMIN 1000 MCG/ML IJ SOLN
1000.0000 ug | Freq: Once | INTRAMUSCULAR | Status: AC
  Administered 2024-03-18: 1000 ug via INTRAMUSCULAR

## 2024-03-18 NOTE — Progress Notes (Signed)
 Patient is in office today for a nurse visit for B12 Injection. Patient Injection was given in the  Left deltoid. Patient tolerated injection well.

## 2024-03-19 LAB — CBC WITH DIFFERENTIAL/PLATELET
Basophils Absolute: 0 x10E3/uL (ref 0.0–0.2)
Basos: 1 %
EOS (ABSOLUTE): 0.1 x10E3/uL (ref 0.0–0.4)
Eos: 3 %
Hematocrit: 37.6 % (ref 34.0–46.6)
Hemoglobin: 12.3 g/dL (ref 11.1–15.9)
Immature Grans (Abs): 0 x10E3/uL (ref 0.0–0.1)
Immature Granulocytes: 0 %
Lymphocytes Absolute: 2 x10E3/uL (ref 0.7–3.1)
Lymphs: 48 %
MCH: 31.7 pg (ref 26.6–33.0)
MCHC: 32.7 g/dL (ref 31.5–35.7)
MCV: 97 fL (ref 79–97)
Monocytes Absolute: 0.5 x10E3/uL (ref 0.1–0.9)
Monocytes: 12 %
Neutrophils Absolute: 1.5 x10E3/uL (ref 1.4–7.0)
Neutrophils: 36 %
Platelets: 175 x10E3/uL (ref 150–450)
RBC: 3.88 x10E6/uL (ref 3.77–5.28)
RDW: 14.2 % (ref 11.7–15.4)
WBC: 4.2 x10E3/uL (ref 3.4–10.8)

## 2024-03-19 LAB — CMP14+EGFR
ALT: 21 IU/L (ref 0–32)
AST: 25 IU/L (ref 0–40)
Albumin: 3.9 g/dL (ref 3.8–4.8)
Alkaline Phosphatase: 92 IU/L (ref 49–135)
BUN/Creatinine Ratio: 16 (ref 12–28)
BUN: 15 mg/dL (ref 8–27)
Bilirubin Total: 0.5 mg/dL (ref 0.0–1.2)
CO2: 25 mmol/L (ref 20–29)
Calcium: 9.2 mg/dL (ref 8.7–10.3)
Chloride: 103 mmol/L (ref 96–106)
Creatinine, Ser: 0.93 mg/dL (ref 0.57–1.00)
Globulin, Total: 2.2 g/dL (ref 1.5–4.5)
Glucose: 79 mg/dL (ref 70–99)
Potassium: 3.9 mmol/L (ref 3.5–5.2)
Sodium: 142 mmol/L (ref 134–144)
Total Protein: 6.1 g/dL (ref 6.0–8.5)
eGFR: 64 mL/min/1.73 (ref >59)

## 2024-03-19 LAB — VITAMIN D 25 HYDROXY (VIT D DEFICIENCY, FRACTURES): Vit D, 25-Hydroxy: 35.9 ng/mL (ref 30.0–100.0)

## 2024-03-19 LAB — TSH: TSH: 1.24 u[IU]/mL (ref 0.450–4.500)

## 2024-03-23 ENCOUNTER — Ambulatory Visit: Payer: Self-pay | Admitting: Family Medicine

## 2024-03-25 ENCOUNTER — Other Ambulatory Visit (HOSPITAL_COMMUNITY): Payer: Self-pay

## 2024-03-25 ENCOUNTER — Other Ambulatory Visit: Payer: Self-pay | Admitting: Family Medicine

## 2024-03-25 ENCOUNTER — Telehealth: Payer: Self-pay

## 2024-03-25 ENCOUNTER — Telehealth: Payer: Self-pay | Admitting: Pharmacy Technician

## 2024-03-25 ENCOUNTER — Encounter: Payer: Self-pay | Admitting: Family Medicine

## 2024-03-25 ENCOUNTER — Ambulatory Visit (INDEPENDENT_AMBULATORY_CARE_PROVIDER_SITE_OTHER): Admitting: Family Medicine

## 2024-03-25 VITALS — BP 135/83 | HR 82 | Ht 63.0 in | Wt 197.0 lb

## 2024-03-25 DIAGNOSIS — Z0001 Encounter for general adult medical examination with abnormal findings: Secondary | ICD-10-CM

## 2024-03-25 DIAGNOSIS — Z1211 Encounter for screening for malignant neoplasm of colon: Secondary | ICD-10-CM

## 2024-03-25 MED ORDER — ORLISTAT 120 MG PO CAPS: 120.0000 mg | ORAL_CAPSULE | Freq: Three times a day (TID) | ORAL | 5 refills | Status: AC

## 2024-03-25 MED ORDER — AMOXICILLIN 500 MG PO CAPS: 500.0000 mg | ORAL_CAPSULE | Freq: Three times a day (TID) | ORAL | 0 refills | Status: AC

## 2024-03-25 MED ORDER — TIRZEPATIDE 5 MG/0.5ML ~~LOC~~ SOAJ: 5.0000 mg | SUBCUTANEOUS | 2 refills | Status: AC

## 2024-03-25 MED ORDER — PANTOPRAZOLE SODIUM 20 MG PO TBEC: 20.0000 mg | DELAYED_RELEASE_TABLET | Freq: Every day | ORAL | 0 refills | Status: AC | PRN

## 2024-03-25 NOTE — Telephone Encounter (Signed)
 Pharmacy Patient Advocate Encounter   Received notification from CoverMyMeds that prior authorization for Mounjaro 5MG /0.5ML auto-injectors is required/requested.   Insurance verification completed.   The patient is insured through [employer].   Ozempic Brena is approved exclusively as an adjunct to diet and exercise to improve glycemic  control in adults with type 2 diabetes mellitus. A review of patient's medical chart reveals no  documented diagnosis of type 2 diabetes or an A1C indicative of diabetes. Therefore, they do not  currently meet the criteria for prior authorization of this medication. If clinically appropriate, alternative  options such as Saxenda, Zepbound, or Wegovy  may be considered for this patient.

## 2024-03-25 NOTE — Telephone Encounter (Unsigned)
 Copied from CRM [id number]. Topic: General - Call Back - No Documentation >> Mar 25, 2024  3:51 PM Joesph B wrote: Reason for CRM: patient states someone called her. No documentation.

## 2024-03-25 NOTE — Patient Instructions (Signed)
 F/U in 12 to 13 weeks  Start Mouinjaro/ Zepbound once weekly to help with weight loss, first month is provided from the office , hopefully insurance will cover the medication Resume xenical  to help with weight loss  Nurse pls order cologuard test for her and let her know to expect kit in the mail, Im also reviewed this with her   Amoxil is prescribed for use with dental work if needed'   It is important that you exercise regularly at least 30 minutes 5 times a week. If you develop chest pain, have severe difficulty breathing, or feel very tired, stop exercising immediately and seek medical attention   Think about what you will eat, plan ahead. Choose  clean, green, fresh or frozen over canned, processed or packaged foods which are more sugary, salty and fatty. 70 to 75% of food eaten should be vegetables and fruit. Three meals at set times with snacks allowed between meals, but they must be fruit or vegetables. Aim to eat over a 12 hour period , example 7 am to 7 pm, and STOP after  your last meal of the day. Drink water ,generally about 64 ounces per day, no other drink is as healthy. Fruit juice is best enjoyed in a healthy way, by EATING the fruit.  Thanks for choosing [facility], we consider it a privelige to serve you. Need covid vaccine this is at your pharmacy

## 2024-03-25 NOTE — Telephone Encounter (Signed)
 Pt advised with verbal understanding

## 2024-03-25 NOTE — Telephone Encounter (Signed)
 Pals let pt know the mouinjaro is not covered since she is not diabetic, and the wegovy  is not on formullary She will not be able to get the injectable meds as a result, needs to work with the xenical , she can use the 4 week supply of mounjaro that she was given I tried calling her , no answer

## 2024-03-25 NOTE — Progress Notes (Unsigned)
    Sheri Jones     MRN: [id number]      DOB: 1945-11-04  Chief Complaint  Patient presents with   Annual Exam    Physical     HPI: Patient is in for annual physical exam. Has upoming dental work just completing a course of amoxil  for gingivitis and is requesting asditonal tablets in the event that hse has a fflare related to dental procedure Wants medication to help with weight loss has regained weight off mounjaro  Immunization is reviewed , and  updated if needed.   PE: BP 135/83   Pulse 82   Ht 5' 3 (1.6 m)   Wt 197 lb (89.4 kg)   SpO2 96%   BMI 34.90 kg/m   Pleasant  female, alert and oriented x 3, in no cardio-pulmonary distress. Afebrile. HEENT No facial trauma or asymetry. Sinuses non tender.  Extra occullar muscles intact.. External ears normal, . Neck: supple, no adenopathy,JVD or thyromegaly.No bruits.  Chest: Clear to ascultation bilaterally.No crackles or wheezes. Non tender to palpation   Cardiovascular system; Heart sounds normal,  S1 and  S2 ,no S3.  No murmur, or thrill. Apical beat not displaced Peripheral pulses normal.  Abdomen: Soft, non tender, no organomegaly or masses. No bruits. Bowel sounds normal. No guarding, tenderness or rebound.    Musculoskeletal exam: Decreased though adequate  ROM of spine, hips , shoulders and knees. No deformity ,swelling or crepitus noted. No muscle wasting or atrophy.   Neurologic: Cranial nerves 2 to 12 intact. Power, tone ,sensation and reflexes normal throughout. No disturbance in gait. No tremor.  Skin: Intact, no ulceration, erythema , scaling or rash noted. Pigmentation normal throughout  Psych; Normal mood and affect. Judgement and concentration normal   Assessment & Plan:  Annual visit for general adult medical examination with abnormal findings Annual exam as documented. Counseling done  re healthy lifestyle involving commitment to 150 minutes exercise per week, heart healthy  diet, and attaining healthy weight.The importance of adequate sleep also discussed. d on by the patient with goals and time frames  set for achieving them. Immunization and cancer screening needs are specifically addressed at this visit.

## 2024-03-26 ENCOUNTER — Other Ambulatory Visit (HOSPITAL_COMMUNITY): Payer: Self-pay

## 2024-03-26 ENCOUNTER — Telehealth: Payer: Self-pay | Admitting: Pharmacy Technician

## 2024-03-26 NOTE — Telephone Encounter (Signed)
 Pharmacy Patient Advocate Encounter   Received notification from CoverMyMeds that prior authorization for Orlistat  120MG  capsules is required/requested.   Insurance verification completed.   The patient is insured through [employer].   Per test claim: PA required; PA submitted to above mentioned insurance via Latent Key/confirmation #/EOC [facility] Status is pending

## 2024-03-26 NOTE — Telephone Encounter (Signed)
 Pharmacy Patient Advocate Encounter  Received notification from [facility] that Prior Authorization for Orlistat  120MG  capsules has been APPROVED from 03/26/2024 to 09/22/2024. Ran test claim, Copay is $7.50. This test claim was processed through [facility]- copay amounts may vary at other pharmacies due to pharmacy/plan contracts, or as the patient moves through the different stages of their insurance plan.   PA #/Case ID/Reference #: [id number]

## 2024-03-27 ENCOUNTER — Other Ambulatory Visit (HOSPITAL_COMMUNITY): Payer: Self-pay

## 2024-03-29 NOTE — Assessment & Plan Note (Signed)
 Annual exam as documented. Counseling done  re healthy lifestyle involving commitment to 150 minutes exercise per week, heart healthy diet, and attaining healthy weight.The importance of adequate sleep also discussed. d on by the patient with goals and time frames  set for achieving them. Immunization and cancer screening needs are specifically addressed at this visit.

## 2024-04-13 ENCOUNTER — Emergency Department (HOSPITAL_BASED_OUTPATIENT_CLINIC_OR_DEPARTMENT_OTHER)
Admission: EM | Admit: 2024-04-13 | Discharge: 2024-04-13 | Disposition: A | Discharge status: Home / Self Care | Attending: Emergency Medicine | Admitting: Emergency Medicine

## 2024-04-13 ENCOUNTER — Encounter (HOSPITAL_BASED_OUTPATIENT_CLINIC_OR_DEPARTMENT_OTHER): Payer: Self-pay

## 2024-04-13 ENCOUNTER — Other Ambulatory Visit: Payer: Self-pay

## 2024-04-13 ENCOUNTER — Emergency Department (HOSPITAL_BASED_OUTPATIENT_CLINIC_OR_DEPARTMENT_OTHER)

## 2024-04-13 DIAGNOSIS — I10 Essential (primary) hypertension: Secondary | ICD-10-CM | POA: Insufficient documentation

## 2024-04-13 DIAGNOSIS — E119 Type 2 diabetes mellitus without complications: Secondary | ICD-10-CM | POA: Insufficient documentation

## 2024-04-13 DIAGNOSIS — J069 Acute upper respiratory infection, unspecified: Secondary | ICD-10-CM | POA: Diagnosis not present

## 2024-04-13 DIAGNOSIS — R059 Cough, unspecified: Secondary | ICD-10-CM | POA: Diagnosis present

## 2024-04-13 LAB — RESP PANEL BY RT-PCR (RSV, FLU A&B, COVID)  RVPGX2
Influenza A by PCR: NEGATIVE
Influenza B by PCR: NEGATIVE
Resp Syncytial Virus by PCR: NEGATIVE
SARS Coronavirus 2 by RT PCR: NEGATIVE

## 2024-04-13 MED ORDER — BENZONATATE 100 MG PO CAPS: 100.0000 mg | ORAL_CAPSULE | Freq: Two times a day (BID) | ORAL | 0 refills | Status: AC | PRN

## 2024-04-13 NOTE — ED Provider Notes (Cosign Needed)
 " [facility] Provider Note   CSN: [id number] Arrival date & time: 04/13/24  1442     Patient presents with: flu-like sypmtoms   Sheri Jones is a 78 y.o. female with a history of hypertension and diabetes who presents to the ED with URI symptoms that began 1 week ago.  The symptoms started as nasal congestion and a productive cough and have been constant since onset.  Patient denies any other URI symptoms such as sore throat, fevers, chills, or bodyaches.  The patient denies any chest pain or shortness of breath.  Patient denies any nausea, vomiting, or diarrhea.  Patient states that she is up-to-date on her vaccinations including seasonal flu vaccination and pneumococcal.  Patient states that she has been using over-the-counter Mucinex as needed for congestion with relief.  The patient states that she was concerned given her age and her cough that she could have pneumonia.  No recent travel. No sick contacts.  Patient is in no acute distress.    HPI     Prior to Admission medications  Medication Sig Start Date End Date Taking? Authorizing Provider  azelastine  (ASTELIN ) 0.1 % nasal spray Place 2 sprays into both nostrils 2 (two) times daily. Use in each nostril as directed Patient not taking: Reported on 03/25/2024 07/12/23   Antonetta Rollene BRAVO, MD  CALCIUM  PO Take 2 tablets by mouth daily.    [provider]  cholecalciferol  (VITAMIN D3) 25 MCG (1000 UT) tablet Take 1,000 Units by mouth daily.    [provider]  cyanocobalamin  (,VITAMIN B-12,) 1000 MCG/ML injection Inject 1,000 mcg into the muscle every 30 (thirty) days.    [provider]  hydrochlorothiazide  (HYDRODIURIL ) 25 MG tablet TAKE 1 TABLET BY MOUTH EVERY DAY 02/17/24   Antonetta Rollene BRAVO, MD  KLOR-CON  10 10 MEQ tablet TAKE 1 TABLET BY MOUTH TWICE A DAY 01/10/24   Antonetta Rollene BRAVO, MD  meclizine  (ANTIVERT ) 25 MG tablet Take 1 tablet (25 mg total) by  mouth 3 (three) times daily as needed for dizziness. Patient not taking: Reported on 03/25/2024 03/19/23   Hildegard Loge, PA-C  Multiple Vitamins-Minerals (CENTRUM ULTRA WOMENS PO) Take 1 tablet by mouth daily.    [provider]  orlistat  (XENICAL ) 120 MG capsule Take 1 capsule (120 mg total) by mouth 3 (three) times daily with meals. 03/25/24   Antonetta Rollene BRAVO, MD  pantoprazole  (PROTONIX ) 20 MG tablet Take 1 tablet (20 mg total) by mouth daily as needed for heartburn or indigestion. 03/25/24   Antonetta Rollene BRAVO, MD  tirzepatide  (MOUNJARO ) 5 MG/0.5ML Pen Inject 5 mg into the skin once a week. 04/22/24   Antonetta Rollene BRAVO, MD    Allergies: Patient has no known allergies.    Review of Systems  Respiratory:  Positive for cough.     Updated Vital Signs BP (!) 142/72 (BP Location: Left Arm)   Pulse 93   Temp 97.9 F (36.6 C)   Resp 18   Ht 5' 3 (1.6 m)   Wt 89.4 kg   SpO2 90%   BMI 34.91 kg/m   Physical Exam Vitals and nursing note reviewed.  Constitutional:      General: She is not in acute distress.    Appearance: Normal appearance.  HENT:     Head: Normocephalic and atraumatic.  Eyes:     Extraocular Movements: Extraocular movements intact.     Conjunctiva/sclera: Conjunctivae normal.     Pupils: Pupils  are equal, round, and reactive to light.  Cardiovascular:     Rate and Rhythm: Normal rate and regular rhythm.     Pulses: Normal pulses.  Pulmonary:     Effort: Pulmonary effort is normal. No respiratory distress.     Breath sounds: Normal breath sounds. No wheezing or rhonchi.     Comments: Patient has no difficulty speaking in complete sentences. Abdominal:     General: Abdomen is flat.     Palpations: Abdomen is soft.     Tenderness: There is no abdominal tenderness.  Musculoskeletal:        General: Normal range of motion.     Cervical back: Normal range of motion.  Skin:    General: Skin is warm and dry.     Capillary Refill: Capillary refill takes  less than 2 seconds.  Neurological:     General: No focal deficit present.     Mental Status: She is alert. Mental status is at baseline.  Psychiatric:        Mood and Affect: Mood normal.     (all labs ordered are listed, but only abnormal results are displayed) Labs Reviewed  RESP PANEL BY RT-PCR (RSV, FLU A&B, COVID)  [id number]    EKG: None  Radiology: No results found.   Procedures   Medications Ordered in the ED - No data to display                               Medical Decision Making  Patient presents to the ED for: URI symptoms This involves an extensive number of treatment options Differential diagnosis includes: Infectious etiology Co-morbid conditions: Hypertension, diabetes  Clinical Course as of 04/17/24 0217  Mon Apr 13, 2024  1639 Temp: 97.9 F (36.6 C) Patient afebrile, vital stable, patient in no acute distress [ML]  1818 DG Chest 2 View No acute findings [ML]    Clinical Course User Index [ML] Willma Duwaine CROME, PA    Data Reviewed / Actions Taken: Imaging ordered/reviewed with my independent interpretation in ED course above. I agree with the radiologists interpretation.   Test Considered/Diagnostic tools:  Additional diagnostic testing, such as further laboratory studies were considered, however, based on the patients presenting symptoms and initial clinical assessment deemed not necessary at this time.  ED Course / Reassessments: Problem List: URI symptoms 78 year old female presented for URI symptoms. Initial assessment included history, physical exam, and review of prior medical records. PCR testing was obtained given clinical presentation and viral PCR testing returned negative.  Despite negative results, patient's symptoms are likely viral in nature and there is low clinical suspicion for underlying bacterial etiology requiring antibiotic treatment at this time based on reassuring vital signs, unremarkable chest x-ray, and overall clinical  appearance. The patient remained stable during the ED course and was deemed appropriate for outpatient management.  Discharge planning include strict return precautions for worsening respiratory symptoms, persistent high fevers, chest pain, inability to tolerate oral intake, or new neurological symptoms.  The patient was advised on continued supportive care at home, including hydration, rest, over-the-counter decongestants/antipyretic use, and follow-up with PCP for further evaluation and care.  Disposition: Disposition: Discharge with close follow-up with PCP for further evaluation and care should symptoms persist or worsen. Rationale for disposition: Stable for discharge. The disposition plan and rationale were discussed with the patient at the bedside, all questions were addressed, and the patient demonstrated understanding.  This note was  produced using Electronics Engineer. While I have reviewed and verified all clinical information, transcription errors may remain.      Final diagnoses:  None    ED Discharge Orders     None          Willma Duwaine CROME, [place] 04/17/24 [id number]  "

## 2024-04-13 NOTE — Discharge Instructions (Addendum)
 Thank you for visiting the Emergency Department today. It was a pleasure to be part of your healthcare team.  As discussed, your evaluation was reassuring.  I have prescribed you some benzonatate  for cough as needed.  If you have any questions about your prescriptions please follow-up with your pharmacy or healthcare provider. At home, rest, hydrate, resume normal diet, and continue to utilize over-the-counter decongestants. It is important to watch for warning signs such as worsening pain, fever, trouble breathing, or chest pain. If any of these happen, return to the Emergency Department or call 911. Thank you for trusting us  with your health.

## 2024-04-13 NOTE — ED Triage Notes (Signed)
 Pt arrives with complaints of congestion. States that she is producing mucous. States that she is concerned about her sickness since she is older. States that she has been sick for a week and her head is killing her

## 2024-04-20 ENCOUNTER — Ambulatory Visit (INDEPENDENT_AMBULATORY_CARE_PROVIDER_SITE_OTHER)

## 2024-04-20 DIAGNOSIS — E538 Deficiency of other specified B group vitamins: Secondary | ICD-10-CM | POA: Diagnosis not present

## 2024-04-20 MED ORDER — CYANOCOBALAMIN 1000 MCG/ML IJ SOLN
1000.0000 ug | Freq: Once | INTRAMUSCULAR | Status: AC
  Administered 2024-04-20: 1000 ug via INTRAMUSCULAR

## 2024-04-20 NOTE — Progress Notes (Signed)
 Patient is in office today for a nurse visit for B12 Injection. Patient Injection was given in the  Left arm. Patient tolerated injection well.

## 2024-04-30 ENCOUNTER — Ambulatory Visit: Payer: Self-pay | Admitting: Family Medicine

## 2024-04-30 LAB — COLOGUARD

## 2024-05-22 ENCOUNTER — Ambulatory Visit (INDEPENDENT_AMBULATORY_CARE_PROVIDER_SITE_OTHER): Payer: Self-pay

## 2024-05-22 DIAGNOSIS — E538 Deficiency of other specified B group vitamins: Secondary | ICD-10-CM | POA: Diagnosis not present

## 2024-05-22 MED ORDER — CYANOCOBALAMIN 1000 MCG/ML IJ SOLN
1000.0000 ug | Freq: Once | INTRAMUSCULAR | Status: AC
  Administered 2024-05-22: 1000 ug via INTRAMUSCULAR

## 2024-05-22 NOTE — Progress Notes (Signed)
 Patient is in office today for a nurse visit for Immunization. Patient Injection was given in the  Left deltoid. Patient tolerated injection well.

## 2024-05-27 LAB — COLOGUARD

## 2024-06-22 ENCOUNTER — Ambulatory Visit: Payer: Self-pay

## 2024-06-25 ENCOUNTER — Ambulatory Visit: Admitting: Family Medicine
